# Patient Record
Sex: Female | Born: 1935
Health system: Southern US, Community
[De-identification: ages and names within clinical notes are randomized; demographics above are authoritative.]

## PROBLEM LIST (undated history)

## (undated) DIAGNOSIS — M1990 Unspecified osteoarthritis, unspecified site: Secondary | ICD-10-CM

## (undated) DIAGNOSIS — I10 Essential (primary) hypertension: Secondary | ICD-10-CM

## (undated) DIAGNOSIS — R011 Cardiac murmur, unspecified: Secondary | ICD-10-CM

## (undated) DIAGNOSIS — E049 Nontoxic goiter, unspecified: Secondary | ICD-10-CM

## (undated) DIAGNOSIS — Z952 Presence of prosthetic heart valve: Secondary | ICD-10-CM

## (undated) DIAGNOSIS — K2270 Barrett's esophagus without dysplasia: Secondary | ICD-10-CM

## (undated) DIAGNOSIS — K589 Irritable bowel syndrome without diarrhea: Secondary | ICD-10-CM

## (undated) DIAGNOSIS — Z87828 Personal history of other (healed) physical injury and trauma: Secondary | ICD-10-CM

## (undated) DIAGNOSIS — Z8619 Personal history of other infectious and parasitic diseases: Secondary | ICD-10-CM

## (undated) DIAGNOSIS — Z9889 Other specified postprocedural states: Secondary | ICD-10-CM

## (undated) DIAGNOSIS — K219 Gastro-esophageal reflux disease without esophagitis: Secondary | ICD-10-CM

## (undated) DIAGNOSIS — Z860100 Personal history of colon polyps, unspecified: Secondary | ICD-10-CM

## (undated) DIAGNOSIS — F419 Anxiety disorder, unspecified: Secondary | ICD-10-CM

## (undated) DIAGNOSIS — M3500 Sicca syndrome, unspecified: Secondary | ICD-10-CM

## (undated) DIAGNOSIS — G4730 Sleep apnea, unspecified: Secondary | ICD-10-CM

## (undated) DIAGNOSIS — D869 Sarcoidosis, unspecified: Secondary | ICD-10-CM

## (undated) DIAGNOSIS — C801 Malignant (primary) neoplasm, unspecified: Secondary | ICD-10-CM

## (undated) DIAGNOSIS — E785 Hyperlipidemia, unspecified: Secondary | ICD-10-CM

## (undated) DIAGNOSIS — Z8679 Personal history of other diseases of the circulatory system: Secondary | ICD-10-CM

## (undated) DIAGNOSIS — T7840XA Allergy, unspecified, initial encounter: Secondary | ICD-10-CM

## (undated) HISTORY — DX: Irritable bowel syndrome, unspecified: K58.9

## (undated) HISTORY — DX: Sarcoidosis, unspecified: D86.9

## (undated) HISTORY — DX: Personal history of colonic polyps: Z86.010

## (undated) HISTORY — PX: CATARACT EXTRACTION W/ INTRAOCULAR LENS  IMPLANT, BILATERAL: SHX1307

## (undated) HISTORY — PX: EYE SURGERY: SHX253

## (undated) HISTORY — DX: Essential (primary) hypertension: I10

## (undated) HISTORY — DX: Gastro-esophageal reflux disease without esophagitis: K21.9

## (undated) HISTORY — DX: Sjogren syndrome, unspecified: M35.00

## (undated) HISTORY — PX: NECK SURGERY: SHX720

## (undated) HISTORY — DX: Other specified postprocedural states: Z98.890

## (undated) HISTORY — DX: Nontoxic goiter, unspecified: E04.9

## (undated) HISTORY — DX: Unspecified osteoarthritis, unspecified site: M19.90

## (undated) HISTORY — DX: Sleep apnea, unspecified: G47.30

## (undated) HISTORY — PX: NASAL SINUS SURGERY: SHX719

## (undated) HISTORY — DX: Personal history of other infectious and parasitic diseases: Z86.19

## (undated) HISTORY — DX: Barrett's esophagus without dysplasia: K22.70

## (undated) HISTORY — DX: Personal history of colon polyps, unspecified: Z86.0100

## (undated) HISTORY — PX: UPPER GASTROINTESTINAL ENDOSCOPY: SHX188

## (undated) HISTORY — DX: Anxiety disorder, unspecified: F41.9

## (undated) HISTORY — DX: Allergy, unspecified, initial encounter: T78.40XA

## (undated) HISTORY — DX: Hyperlipidemia, unspecified: E78.5

## (undated) HISTORY — PX: DILATION AND CURETTAGE OF UTERUS: SHX78

---

## 1972-07-26 HISTORY — PX: BREAST LUMPECTOMY: SHX2

## 1974-07-26 HISTORY — PX: TONSILLECTOMY AND ADENOIDECTOMY: SUR1326

## 1998-03-13 ENCOUNTER — Inpatient Hospital Stay (HOSPITAL_COMMUNITY): Admission: RE | Admit: 1998-03-13 | Discharge: 1998-03-13 | Payer: Self-pay | Admitting: Neurosurgery

## 1998-04-07 ENCOUNTER — Ambulatory Visit (HOSPITAL_COMMUNITY): Admission: RE | Admit: 1998-04-07 | Discharge: 1998-04-07 | Payer: Self-pay | Admitting: Neurosurgery

## 1998-10-07 ENCOUNTER — Other Ambulatory Visit: Admission: RE | Admit: 1998-10-07 | Discharge: 1998-10-07 | Payer: Self-pay | Admitting: *Deleted

## 1999-10-07 ENCOUNTER — Other Ambulatory Visit: Admission: RE | Admit: 1999-10-07 | Discharge: 1999-10-07 | Payer: Self-pay | Admitting: *Deleted

## 2000-10-07 ENCOUNTER — Other Ambulatory Visit: Admission: RE | Admit: 2000-10-07 | Discharge: 2000-10-07 | Payer: Self-pay | Admitting: *Deleted

## 2002-04-13 ENCOUNTER — Other Ambulatory Visit: Admission: RE | Admit: 2002-04-13 | Discharge: 2002-04-13 | Payer: Self-pay | Admitting: Family Medicine

## 2002-07-29 ENCOUNTER — Encounter: Payer: Self-pay | Admitting: Emergency Medicine

## 2002-07-29 ENCOUNTER — Emergency Department (HOSPITAL_COMMUNITY): Admission: EM | Admit: 2002-07-29 | Discharge: 2002-07-29 | Payer: Self-pay | Admitting: Emergency Medicine

## 2004-06-23 ENCOUNTER — Other Ambulatory Visit: Admission: RE | Admit: 2004-06-23 | Discharge: 2004-06-23 | Payer: Self-pay | Admitting: Obstetrics and Gynecology

## 2004-08-18 ENCOUNTER — Ambulatory Visit: Payer: Self-pay | Admitting: Internal Medicine

## 2004-08-24 ENCOUNTER — Ambulatory Visit: Admission: RE | Admit: 2004-08-24 | Discharge: 2004-08-24 | Payer: Self-pay | Admitting: Internal Medicine

## 2004-08-24 ENCOUNTER — Ambulatory Visit: Payer: Self-pay | Admitting: Pulmonary Disease

## 2004-08-27 ENCOUNTER — Ambulatory Visit: Payer: Self-pay

## 2004-09-02 ENCOUNTER — Ambulatory Visit: Payer: Self-pay | Admitting: Internal Medicine

## 2004-11-12 ENCOUNTER — Ambulatory Visit: Payer: Self-pay | Admitting: Internal Medicine

## 2004-11-12 ENCOUNTER — Ambulatory Visit (HOSPITAL_COMMUNITY): Admission: RE | Admit: 2004-11-12 | Discharge: 2004-11-12 | Payer: Self-pay | Admitting: Internal Medicine

## 2004-11-30 ENCOUNTER — Ambulatory Visit: Payer: Self-pay | Admitting: Internal Medicine

## 2004-12-28 ENCOUNTER — Ambulatory Visit: Payer: Self-pay | Admitting: Internal Medicine

## 2005-01-04 ENCOUNTER — Encounter (INDEPENDENT_AMBULATORY_CARE_PROVIDER_SITE_OTHER): Payer: Self-pay | Admitting: *Deleted

## 2005-01-04 ENCOUNTER — Ambulatory Visit: Payer: Self-pay | Admitting: Internal Medicine

## 2005-01-04 ENCOUNTER — Ambulatory Visit (HOSPITAL_COMMUNITY): Admission: RE | Admit: 2005-01-04 | Discharge: 2005-01-04 | Payer: Self-pay | Admitting: Internal Medicine

## 2005-07-21 ENCOUNTER — Other Ambulatory Visit: Admission: RE | Admit: 2005-07-21 | Discharge: 2005-07-21 | Payer: Self-pay | Admitting: Obstetrics and Gynecology

## 2005-12-14 ENCOUNTER — Ambulatory Visit: Payer: Self-pay | Admitting: Internal Medicine

## 2006-01-21 ENCOUNTER — Ambulatory Visit: Payer: Self-pay | Admitting: Internal Medicine

## 2006-01-25 ENCOUNTER — Ambulatory Visit: Payer: Self-pay | Admitting: Internal Medicine

## 2006-01-28 ENCOUNTER — Ambulatory Visit: Payer: Self-pay | Admitting: Internal Medicine

## 2006-02-07 ENCOUNTER — Ambulatory Visit (HOSPITAL_COMMUNITY): Admission: RE | Admit: 2006-02-07 | Discharge: 2006-02-07 | Payer: Self-pay | Admitting: Obstetrics and Gynecology

## 2006-02-22 ENCOUNTER — Ambulatory Visit: Payer: Self-pay | Admitting: Internal Medicine

## 2006-02-22 ENCOUNTER — Ambulatory Visit (HOSPITAL_COMMUNITY): Admission: RE | Admit: 2006-02-22 | Discharge: 2006-02-22 | Payer: Self-pay | Admitting: Cardiology

## 2006-04-01 ENCOUNTER — Ambulatory Visit: Payer: Self-pay | Admitting: Internal Medicine

## 2006-11-07 ENCOUNTER — Emergency Department (HOSPITAL_COMMUNITY): Admission: EM | Admit: 2006-11-07 | Discharge: 2006-11-07 | Payer: Self-pay | Admitting: Emergency Medicine

## 2007-08-14 ENCOUNTER — Ambulatory Visit: Payer: Self-pay | Admitting: Internal Medicine

## 2007-08-21 ENCOUNTER — Encounter: Payer: Self-pay | Admitting: Internal Medicine

## 2007-08-21 ENCOUNTER — Ambulatory Visit: Payer: Self-pay | Admitting: Internal Medicine

## 2007-10-09 ENCOUNTER — Ambulatory Visit: Payer: Self-pay | Admitting: Internal Medicine

## 2008-01-03 ENCOUNTER — Telehealth: Payer: Self-pay | Admitting: Internal Medicine

## 2008-03-20 ENCOUNTER — Encounter: Admission: RE | Admit: 2008-03-20 | Discharge: 2008-03-20 | Payer: Self-pay | Admitting: Internal Medicine

## 2008-03-20 ENCOUNTER — Encounter (INDEPENDENT_AMBULATORY_CARE_PROVIDER_SITE_OTHER): Payer: Self-pay | Admitting: Interventional Radiology

## 2008-03-20 ENCOUNTER — Other Ambulatory Visit: Admission: RE | Admit: 2008-03-20 | Discharge: 2008-03-20 | Payer: Self-pay | Admitting: Interventional Radiology

## 2008-06-05 HISTORY — PX: TOTAL THYROIDECTOMY: SHX2547

## 2008-08-01 ENCOUNTER — Telehealth: Payer: Self-pay | Admitting: Internal Medicine

## 2008-08-02 DIAGNOSIS — R1319 Other dysphagia: Secondary | ICD-10-CM | POA: Insufficient documentation

## 2008-08-08 ENCOUNTER — Ambulatory Visit (HOSPITAL_COMMUNITY): Admission: RE | Admit: 2008-08-08 | Discharge: 2008-08-08 | Payer: Self-pay | Admitting: Internal Medicine

## 2008-08-09 DIAGNOSIS — K219 Gastro-esophageal reflux disease without esophagitis: Secondary | ICD-10-CM | POA: Insufficient documentation

## 2008-08-09 DIAGNOSIS — F411 Generalized anxiety disorder: Secondary | ICD-10-CM | POA: Insufficient documentation

## 2008-08-09 DIAGNOSIS — I10 Essential (primary) hypertension: Secondary | ICD-10-CM | POA: Insufficient documentation

## 2008-08-09 DIAGNOSIS — D869 Sarcoidosis, unspecified: Secondary | ICD-10-CM | POA: Insufficient documentation

## 2008-08-09 DIAGNOSIS — Z8719 Personal history of other diseases of the digestive system: Secondary | ICD-10-CM | POA: Insufficient documentation

## 2008-08-09 DIAGNOSIS — M1990 Unspecified osteoarthritis, unspecified site: Secondary | ICD-10-CM | POA: Insufficient documentation

## 2008-08-09 DIAGNOSIS — K589 Irritable bowel syndrome without diarrhea: Secondary | ICD-10-CM | POA: Insufficient documentation

## 2008-08-09 DIAGNOSIS — Z860100 Personal history of colon polyps, unspecified: Secondary | ICD-10-CM | POA: Insufficient documentation

## 2008-08-09 DIAGNOSIS — K2270 Barrett's esophagus without dysplasia: Secondary | ICD-10-CM | POA: Insufficient documentation

## 2008-08-09 DIAGNOSIS — J45909 Unspecified asthma, uncomplicated: Secondary | ICD-10-CM | POA: Insufficient documentation

## 2008-08-09 DIAGNOSIS — E785 Hyperlipidemia, unspecified: Secondary | ICD-10-CM | POA: Insufficient documentation

## 2008-08-09 DIAGNOSIS — K649 Unspecified hemorrhoids: Secondary | ICD-10-CM | POA: Insufficient documentation

## 2008-08-09 DIAGNOSIS — F419 Anxiety disorder, unspecified: Secondary | ICD-10-CM | POA: Insufficient documentation

## 2008-08-14 ENCOUNTER — Ambulatory Visit: Payer: Self-pay | Admitting: Internal Medicine

## 2008-08-14 DIAGNOSIS — K920 Hematemesis: Secondary | ICD-10-CM | POA: Insufficient documentation

## 2008-08-14 LAB — CONVERTED CEMR LAB
Basophils Relative: 0.3 % (ref 0.0–3.0)
Calcium: 9.4 mg/dL (ref 8.4–10.5)
Creatinine, Ser: 1.3 mg/dL — ABNORMAL HIGH (ref 0.4–1.2)
Eosinophils Absolute: 0 10*3/uL (ref 0.0–0.7)
GFR calc non Af Amer: 42 mL/min
Glucose, Bld: 152 mg/dL — ABNORMAL HIGH (ref 70–99)
HCT: 41.7 % (ref 36.0–46.0)
Hemoglobin: 14.9 g/dL (ref 12.0–15.0)
Lymphocytes Relative: 4.2 % — ABNORMAL LOW (ref 12.0–46.0)
Monocytes Absolute: 0.4 10*3/uL (ref 0.1–1.0)
Monocytes Relative: 3.2 % (ref 3.0–12.0)
Potassium: 4.4 meq/L (ref 3.5–5.1)
RDW: 11.8 % (ref 11.5–14.6)

## 2008-08-15 ENCOUNTER — Ambulatory Visit: Payer: Self-pay | Admitting: Internal Medicine

## 2008-08-15 ENCOUNTER — Encounter: Payer: Self-pay | Admitting: Internal Medicine

## 2008-08-16 ENCOUNTER — Encounter: Payer: Self-pay | Admitting: Internal Medicine

## 2009-03-26 ENCOUNTER — Encounter: Payer: Self-pay | Admitting: Internal Medicine

## 2009-04-01 ENCOUNTER — Encounter: Payer: Self-pay | Admitting: Internal Medicine

## 2009-06-27 ENCOUNTER — Telehealth (INDEPENDENT_AMBULATORY_CARE_PROVIDER_SITE_OTHER): Payer: Self-pay | Admitting: *Deleted

## 2010-03-03 ENCOUNTER — Emergency Department (HOSPITAL_COMMUNITY): Admission: EM | Admit: 2010-03-03 | Discharge: 2010-03-03 | Payer: Self-pay | Admitting: Emergency Medicine

## 2010-04-20 ENCOUNTER — Ambulatory Visit: Payer: Self-pay | Admitting: Cardiology

## 2010-04-24 ENCOUNTER — Ambulatory Visit: Payer: Self-pay | Admitting: Cardiology

## 2010-04-24 ENCOUNTER — Encounter: Admission: RE | Admit: 2010-04-24 | Discharge: 2010-04-24 | Payer: Self-pay | Admitting: Cardiology

## 2010-07-08 ENCOUNTER — Telehealth: Payer: Self-pay | Admitting: Physician Assistant

## 2010-07-09 ENCOUNTER — Encounter: Payer: Self-pay | Admitting: Physician Assistant

## 2010-07-09 ENCOUNTER — Ambulatory Visit: Payer: Self-pay | Admitting: Internal Medicine

## 2010-07-09 DIAGNOSIS — E039 Hypothyroidism, unspecified: Secondary | ICD-10-CM | POA: Insufficient documentation

## 2010-07-09 DIAGNOSIS — R141 Gas pain: Secondary | ICD-10-CM | POA: Insufficient documentation

## 2010-07-09 DIAGNOSIS — R143 Flatulence: Secondary | ICD-10-CM

## 2010-07-09 DIAGNOSIS — R142 Eructation: Secondary | ICD-10-CM

## 2010-07-09 DIAGNOSIS — R1032 Left lower quadrant pain: Secondary | ICD-10-CM | POA: Insufficient documentation

## 2010-07-09 DIAGNOSIS — K5900 Constipation, unspecified: Secondary | ICD-10-CM | POA: Insufficient documentation

## 2010-07-09 DIAGNOSIS — R1012 Left upper quadrant pain: Secondary | ICD-10-CM | POA: Insufficient documentation

## 2010-07-21 ENCOUNTER — Telehealth: Payer: Self-pay | Admitting: Internal Medicine

## 2010-07-24 ENCOUNTER — Ambulatory Visit (HOSPITAL_COMMUNITY)
Admission: RE | Admit: 2010-07-24 | Discharge: 2010-07-24 | Payer: Self-pay | Source: Home / Self Care | Attending: Internal Medicine | Admitting: Internal Medicine

## 2010-07-24 ENCOUNTER — Encounter: Payer: Self-pay | Admitting: Internal Medicine

## 2010-07-28 ENCOUNTER — Encounter: Payer: Self-pay | Admitting: Internal Medicine

## 2010-07-29 ENCOUNTER — Encounter (INDEPENDENT_AMBULATORY_CARE_PROVIDER_SITE_OTHER): Payer: Self-pay | Admitting: *Deleted

## 2010-08-16 ENCOUNTER — Encounter: Payer: Self-pay | Admitting: Obstetrics and Gynecology

## 2010-08-16 ENCOUNTER — Encounter: Payer: Self-pay | Admitting: Internal Medicine

## 2010-08-27 NOTE — Letter (Signed)
Summary: [facility] Instructions  [facility]  [address] [place], [place] [address]   Phone: [phone]  Fax: [phone]       MATTISEN POHLMANN    November 29, 75 1932    MRN: [id number]       Procedure Day /Date:12-30 2011     Arrival Time: 8:30 Am      Procedure Time: 9:30 AM     Location of Procedure:                     X     [facility] ( Outpatient Registration)   PREPARATION FOR COLONOSCOPY WITH MIRALAX  Starting 5 days prior to your procedure 07-19-2010 do not eat nuts, seeds, popcorn, corn, beans, peas,  salads, or any raw vegetables.  Do not take any fiber supplements (e.g. Metamucil, Citrucel, and Benefiber). ____________________________________________________________________________________________________   THE DAY BEFORE YOUR PROCEDURE         DATE:07-23-2010 DAY: Thursday  1   Drink clear liquids the entire day-NO SOLID FOOD  2   Do not drink anything colored red or purple.  Avoid juices with pulp.  No orange juice.  3   Drink at least 64 oz. (8 glasses) of fluid/clear liquids during the day to prevent dehydration and help the prep work efficiently.  CLEAR LIQUIDS INCLUDE: Water Jello Ice Popsicles Tea (sugar ok, no milk/cream) Powdered fruit flavored drinks Coffee (sugar ok, no milk/cream) Gatorade Juice: apple, white grape, white cranberry  Lemonade Clear bullion, consomm, broth Carbonated beverages (any kind) Strained chicken noodle soup Hard Candy  4   Mix the entire bottle of Miralax with 64 oz. of Gatorade/Powerade in the morning and put in the refrigerator to chill.  5   At 3:00 pm take 2 Dulcolax/Bisacodyl tablets.  6   At 4:30 pm take one Reglan/Metoclopramide tablet.  7  Starting at 5:00 pm drink one 8 oz glass of the Miralax mixture every 15-20 minutes until you have finished drinking the entire 64 oz.  You should finish drinking prep around 7:30 or 8:00 pm.  8   If you are nauseated, you may take the 2nd  Reglan/Metoclopramide tablet at 6:30 pm.        9    At 8:00 pm take 2 more DULCOLAX/Bisacodyl tablets.     THE DAY OF YOUR PROCEDURE      DATE: 07-24-2010 DAY: Friday  You may drink clear liquids until 7:30 AM  (2 HOURS BEFORE PROCEDURE).    MEDICATION INSTRUCTIONS  Unless otherwise instructed, you should take regular prescription medications with a small sip of water as early as possible the morning of your procedure.        OTHER INSTRUCTIONS  You will need a responsible adult at least 75 years of age to accompany you and drive you home.   This person must remain in the waiting room during your procedure.  Wear loose fitting clothing that is easily removed.  Leave jewelry and other valuables at home.  However, you may wish to bring a book to read or an iPod/MP3 player to listen to music as you wait for your procedure to start.  Remove all body piercing jewelry and leave at home.  Total time from sign-in until discharge is approximately 2-3 hours.  You should go home directly after your procedure and rest.  You can resume normal activities the day after your procedure.  The day of your procedure you should not:   Drive  Make legal decisions   Operate machinery   Drink alcohol   Return to work  You will receive specific instructions about eating, activities and medications before you leave.   The above instructions have been reviewed and explained to me by   _______________________    I fully understand and can verbalize these instructions _____________________________ Date _______

## 2010-08-27 NOTE — Letter (Signed)
Summary: Patient Notice-Endo Biopsy Results  [facility]  [address] [place], [place] [address]   Phone: [phone]  Fax: [phone]        July 28, 2010 MRN: [id number]    Brenda [name] [address] [place], [place]  [address]    Dear [name],  I am pleased to inform you that the biopsies taken during your recent endoscopic examination did not show any evidence of cancer upon pathologic examination.There is no evidence of Barrett's esophagus  Additional information/recommendations:  __No further action is needed at this time.  Please follow-up with      your primary care physician for your other healthcare needs.  __ Please call [phone] to schedule a return visit to review      your condition.  _x_ Continue with the treatment plan as outlined on the day of your      exam.  _x_ You should have a repeat endoscopic examination for this problem              in 5 years_  Please call us if you are having persistent problems or have questions about your condition that have not been fully answered at this time.  Sincerely,  Hart Carwin MD  This letter has been electronically signed by your physician.  Appended Document: Patient Notice-Endo Biopsy Results Letter mailed to patient's home.  Appended Document: Patient Notice-Endo Biopsy Results 5 year recall in IDX.

## 2010-08-27 NOTE — Miscellaneous (Signed)
  Clinical Lists Changes  Observations: Added new observation of EGD: 07/17/10 (07/29/2010 8:16) Added new observation of COLONNXTDUE: 06/2015 (07/29/2010 8:16) Added new observation of COLONOSCOPY: 07/17/10 (07/29/2010 8:16)

## 2010-08-27 NOTE — Progress Notes (Signed)
Summary: Triage  Phone Note Call from Patient   Caller: [facility] Call For: Dr. Juanda Chance Reason for Call: Talk to Nurse Summary of Call: Beth from Dr. Darrol Poke office is calling to schedule this appt as soon as possible for severe abdominal pain, pt is hard of hearing so call beth back to schedule pt, her direct line is [phone], pt was seen today and Waynetta Sandy is faxing office notes for [place] to review Initial call taken by: [place] Johnson,  July 08, 2010 3:45 PM  Follow-up for Phone Call        Spoke with [facility] and scheduled patient on 07/09/10 at 2:00PM with Mike Gip, PA. She will fax [place] the records from their office. Follow-up by: Jesse Fall RN,  July 08, 2010 4:26 PM  Additional Follow-up for Phone Call Additional follow up Details #1::        Reviewed and agree. IBS, Barret's 07/2008, recall 07/2010. Additional Follow-up by: Hart Carwin MD,  July 08, 2010 11:23 PM

## 2010-08-27 NOTE — Procedures (Signed)
Summary: Upper Endoscopy  Patient: [name] Note: All result statuses are Final unless otherwise noted.  Tests: (1) Upper Endoscopy (EGD)   EGD Upper Endoscopy       DONE     [facility]     [address] [place], [place]  [address]           ENDOSCOPY PROCEDURE REPORT           PATIENT:  Brenda [name], Brenda [name]  MR#:  [id number]     BIRTHDATE:  Oct 26, 1930, 79 yrs. old  GENDER:  female           ENDOSCOPIST:  Hedwig Morton. Juanda Chance, MD     Referred by:  Catha Gosselin, M.D.           PROCEDURE DATE:  07/24/2010     PROCEDURE:  EGD with biopsy, [address]     ASA CLASS:  Class II     INDICATIONS:  h/o Barrett's Esophagus last EGD 07/2008 intestinal     metaplasia           MEDICATIONS:   Versed 7 mg, Fentanyl 75 mcg, Benadryl 25 mg     TOPICAL ANESTHETIC:  Cetacaine Spray           DESCRIPTION OF PROCEDURE:   After the risks benefits and     alternatives of the procedure were thoroughly explained, informed     consent was obtained.  The Pentax Gastroscope [id number] endoscope     was introduced through the mouth and advanced to the second     portion of the duodenum, without limitations.  The instrument was     slowly withdrawn as the mucosa was fully examined.     <<PROCEDUREIMAGES>>           irregular Z-line. With standard forceps, a biopsy was obtained and     sent to pathology (see image1). r/o Barrett's  Otherwise the     examination was normal (see image4, image3, and image2).     Retroflexed views revealed no abnormalities.    The scope was then     withdrawn from the patient and the procedure completed.           COMPLICATIONS:  None           ENDOSCOPIC IMPRESSION:     1) Irregular Z-line     2) Otherwise normal examination     s/p Bx's to r/o Barrett's     RECOMMENDATIONS:     1) Await biopsy results     2) Anti-reflux regimen to be follow     continue PPI           REPEAT EXAM:  In 2 year(s) for.           ______________________________     Hedwig Morton. Juanda Chance, MD           CC:           n.     eSIGNED:   Hedwig Morton. Brodie at 07/24/2010 10:55 AM           Tawana Scale, [id number]  Note: An exclamation mark (!) indicates a result that was not dispersed into the flowsheet. Document Creation Date: 07/24/2010 10:56 AM _______________________________________________________________________  (1) Order result status: Final Collection or observation date-time: 07/24/2010 10:35 Requested date-time:  Receipt date-time:  Reported date-time:  Referring Physician:   Ordering Physician: Lina Sar [phone]) Specimen Source:  Source: Launa Grill Order Number: [id number] Lab  site:

## 2010-08-27 NOTE — Letter (Signed)
Summary: Patient Notice- Polyp Results  [facility]  [address] [place], [place] [address]   Phone: [phone]  Fax: [phone]        July 28, 2010 MRN: [id number]    Brenda [name] [address] [place], [place]  [address]    Dear [name],  I am pleased to inform you that the colon polyp(s) removed during your recent colonoscopy was (were) found to be benign (no cancer detected) upon pathologic examination.The polyp was adenomatous ( precancerous)  I recommend you have a repeat colonoscopy examination in 5_ years to look for recurrent polyps, as having colon polyps increases your risk for having recurrent polyps or even colon cancer in the future.  Should you develop new or worsening symptoms of abdominal pain, bowel habit changes or bleeding from the rectum or bowels, please schedule an evaluation with either your primary care physician or with me.  Additional information/recommendations:  _x_ No further action with gastroenterology is needed at this time. Please      follow-up with your primary care physician for your other healthcare      needs.  __ Please call [phone] to schedule a return visit to review your      situation.  __ Please keep your follow-up visit as already scheduled.  __ Continue treatment plan as outlined the day of your exam.  Please call us if you are having persistent problems or have questions about your condition that have not been fully answered at this time.  Sincerely,  Hart Carwin MD  This letter has been electronically signed by your physician.  Appended Document: Patient Notice- Polyp Results Letter mailed to patient. 5 year recall in IDX.

## 2010-08-27 NOTE — Procedures (Signed)
Summary: Colonoscopy  Patient: Brenda [name] Note: All result statuses are Final unless otherwise noted.  Tests: (1) Colonoscopy (COL)   COL Colonoscopy           DONE     [facility]     [address] [place], [place]  [address]           COLONOSCOPY PROCEDURE REPORT           PATIENT:  Brenda [name], Brenda [name]  MR#:  [id number]     BIRTHDATE:  1931-05-26, 79 yrs. old  GENDER:  female     ENDOSCOPIST:  Hedwig Morton. Juanda Chance, MD     REF. BY:  Catha Gosselin, M.D.     PROCEDURE DATE:  07/24/2010     PROCEDURE:  Colonoscopy 19147     ASA CLASS:  Class II     INDICATIONS:  Abdominal pain, family history of colon cancer hx     adenomatous polyp in 1992, also 1995, 2001, last colon 2009     tubular adenoma     MEDICATIONS:   Versed 3 mg, Fentanyl 25 mcg, Benadryl 25 mg           DESCRIPTION OF PROCEDURE:   After the risks benefits and     alternatives of the procedure were thoroughly explained, informed     consent was obtained.  Digital rectal exam was performed and     revealed no rectal masses.   The  endoscope was introduced through     the anus and advanced to the cecum, which was identified by both     the appendix and ileocecal valve, without limitations.  The     quality of the prep was excellent, using MiraLax.  The instrument     was then slowly withdrawn as the colon was fully examined.     <<PROCEDUREIMAGES>>           FINDINGS:  Melanosis coli was found (see image1).  A sessile polyp     was found. 4 mm flat polyp at 70 cm Polyps were snared, then     cauterized with monopolar cautery. Retrieval was successful (see     image3). snare polyp  This was otherwise a normal examination of     the colon (see image6, image5, image1, and image2).   Retroflexed     views in the rectum revealed no abnormalities.    The scope was     then withdrawn from the patient and the procedure completed.           COMPLICATIONS:  None     ENDOSCOPIC IMPRESSION:     1) Melanosis     2) Sessile  polyp     3) Otherwise normal examination     no evidence of diverticulosis     RECOMMENDATIONS:     1) Await pathology results     REPEAT EXAM:  In 5 year(s) for.           ______________________________     Hedwig Morton. Juanda Chance, MD           CC:           n.     eSIGNED:   Hedwig Morton. Brodie at 07/24/2010 11:04 AM           Tawana Scale, [id number]  Note: An exclamation mark (!) indicates a result that was not dispersed into the flowsheet. Document Creation Date: 07/24/2010 11:04 AM _______________________________________________________________________  (1)  Order result status: Final Collection or observation date-time: 07/24/2010 10:56 Requested date-time:  Receipt date-time:  Reported date-time:  Referring Physician:   Ordering Physician: Lina Sar [phone]) Specimen Source:  Source: Launa Grill Order Number: [id number] Lab site:   Appended Document: Colonoscopy Recall 5 year in IDX

## 2010-08-27 NOTE — Assessment & Plan Note (Signed)
Summary: severe abd pain/Regina   History of Present Illness Visit Type: Follow-up Visit Primary GI MD: Lina Sar MD Primary Alexzandrea Normington: Beulah Gandy Requesting Mykayla Brinton: n/a Chief Complaint: Severe abdominal pain, on her left side History of Present Illness:   77 YO FEMALE KNOWN TO DR. Juanda Chance. SHE HAS HX OF BARRETTS ESOPHAGUS,GASTRIC ULCER,2006, AND MULTIPLE ANTRAL ULCERS ON EGD IN 1/10. ALSO WITH ADENOMATOUS POLYPS ON COLONOSCOPY 2009.  SHE COMES IN TODAY WITH C/O LEFT SIDED ABDOMINAL PAIN. PAIN RADIATES AROUND IN TO HER BACK OVER THE PAST FEW DAYS . SHE FEELS" SORE". SHE SAYS OVER THE PAST 6 MONTHS SHE HAS BEEN STRUGGLING WITH ALTERNATING BOWEL HABITS. ALSO C/O UPPER ABDOMINAL BLOATING AND SWELLING. INTERMITTENT INDIGESTION. APPETITE IS FINE,WEIGHT  IS UP SINCE SHE HAD THYROID SURGERY. ON NEXIUM OVER THE PAST COUPLE MONTHS WITH NO CHANGE. SHE SAYS SHE REALLY HAS NOT FELT WELL SINCE THE THYROID SURGERY- HAS GAINED WEIGHT, HAS HAS HAIR LOSS, LACK OF ENERGY AND IS COLD ALL THE TIME. SHE ALSO HAS HAS OF PULMONARY SARCOIDOSIS, AND WONDERS IF IT IS ACTIVE.    GI Review of Systems    Reports abdominal pain, acid reflux, bloating, and  heartburn.     Location of  Abdominal pain: LLQ.    Denies belching, chest pain, dysphagia with liquids, dysphagia with solids, loss of appetite, nausea, vomiting, vomiting blood, weight loss, and  weight gain.      Reports constipation and  diarrhea.     Denies anal fissure, black tarry stools, change in bowel habit, diverticulosis, fecal incontinence, heme positive stool, hemorrhoids, irritable bowel syndrome, jaundice, light color stool, liver problems, rectal bleeding, and  rectal pain.    Current Medications (verified): 1)  Xanax 1 Mg Tabs (Alprazolam) .... 2 Every Am and 3 At Bedtime 2)  Triamterene-Hctz 75-50 Mg Tabs (Triamterene-Hctz) 3)  Pravastatin Sodium 80 Mg Tabs (Pravastatin Sodium) .Marland Kitchen.. 1 By Mouth Once Daily 4)  Multivitamins   Tabs (Multiple  Vitamin) .... Once Daily 5)  Oscal 500/200 D-3 500-200 Mg-Unit Tabs (Calcium-Vitamin D) .... Once Daily 6)  Senna 187 Mg Tabs (Senna) .... Take 3 Tablets At Bedtime 7)  Systane 0.4-0.3 % Soln (Polyethyl Glycol-Propyl Glycol) .Marland Kitchen.. 1 Drop Into Affected Eye As Needed 2-4 Tmes Daily 8)  Restasis 0.05 % Emul (Cyclosporine) .... As Directed 9)  Alrex 0.2 % Susp (Loteprednol Etabonate) .Marland Kitchen.. 1 Drop Into Affected Eye 3 Times A Week 10)  Zaditor 0.025 % Soln (Ketotifen Fumarate) .Marland Kitchen.. 1 Drop Into Affected Eye Two Times A Day 11)  Selenium 200 Mcg Tabs (Selenium) .... Take 1 Tablet By Mouth Once Daily 12)  Fish Oil 1000 Mg Caps (Omega-3 Fatty Acids) .... Take 3 Capsules By Mouth Three Times A Day 13)  Nexium 40 Mg Cpdr (Esomeprazole Magnesium) .... Take 1 Tablet Every Other Day 14)  Amlodipine Besylate 5 Mg Tabs (Amlodipine Besylate) .... Take 1 Tablet By Mouth Once Daily 15)  Synthroid 75 Mcg Tabs (Levothyroxine Sodium) .... Take 1 Tablet By Mouth Once Daily 16)  Flonase 50 Mcg/act Susp (Fluticasone Propionate) .... 2 Puffs Once Daily 17)  Benadryl 25 Mg Tabs (Diphenhydramine Hcl) .... Take 1 Tablet By Mouth Every 6 Hours 18)  Vitamin D 1000 Unit Tabs (Cholecalciferol) .Marland Kitchen.. 1 By Mouth Once Daily  Allergies (verified): No Known Drug Allergies  Past History:  Past Medical History: Current Problems:  ANXIETY (ICD-300.00) HYPERLIPIDEMIA (ICD-272.4) HYPERTENSION (ICD-401.9) IRRITABLE BOWEL SYNDROME (ICD-564.1) HEMORRHOIDS (ICD-455.6) DEGENERATIVE JOINT DISEASE (ICD-715.90) COLONIC POLYPS, ADENOMATOUS, HX OF (ICD-V12.72) HX  GASTRIC ULCER SARCOIDOSIS (ICD-135) ASTHMA, UNSPECIFIED (ICD-493.90) BARRETTS ESOPHAGUS (ICD-530.85) HELICOBACTER PYLORI GASTRITIS, HX OF (ICD-V12.79) Family Hx of COLON CANCER (ICD-153.9) GERD (ICD-530.81) HX SHINGLES/FACE/ EYE  Past Surgical History: D & C Cervical Disk with plate and bone fusions x 2 sinus surgery breast lumpectomy Thyroid Surgery-2009   Family  History: Reviewed history from 08/09/2008 and no changes required. Family History of Colon Cancer: Sister Family History of Heart Disease: Father  Social History: Patient has never smoked.  Alcohol Use - yes-occasional wine Illicit Drug Use - no Occupation: retired at 22  Review of Systems  The patient denies allergy/sinus, anemia, anxiety-new, arthritis/joint pain, back pain, blood in urine, breast changes/lumps, change in vision, confusion, cough, coughing up blood, depression-new, fainting, fatigue, fever, headaches-new, hearing problems, heart murmur, heart rhythm changes, itching, menstrual pain, muscle pains/cramps, night sweats, nosebleeds, pregnancy symptoms, shortness of breath, skin rash, sleeping problems, sore throat, swelling of feet/legs, swollen lymph glands, thirst - excessive , urination - excessive , urination changes/pain, urine leakage, vision changes, and voice change.         SEE HPI  Vital Signs:  Patient profile:   77 year old female Height:      63 inches Weight:      149 pounds BMI:     26.49 BSA:     1.71 Pulse rate:   88 / minute Pulse rhythm:   regular BP sitting:   122 / 80  (left arm)  Vitals Entered By: Merri Ray CMA Duncan Dull) (July 09, 2010 1:50 PM)  Physical Exam  General:  Well developed, well nourished, no acute distress. Head:  Normocephalic and atraumatic. Eyes:  PERRLA, no icterus.,PT LOST HER EYEBROWS WITH SHINGLES Lungs:  Clear throughout to auscultation. Heart:  Regular rate and rhythm; no murmurs, rubs,  or bruits. Abdomen:  SOFT, BS+, MILD TENDERNESS LEFT MID QUAD. SHE ASLO HAS A FAINT PATCHY ERYTHEMA ON UPPER LEFT ABDOMEN ?EARLY ZOSTER. NO MASS OR HSM, Rectal:  NOT DONE Extremities:  No clubbing, cyanosis, edema or deformities noted. Neurologic:  Alert and  oriented x4;  grossly normal neurologically. Psych:  Alert and cooperative. Normal mood and affect.   Impression & Recommendations:  Problem # 1:  ABDOMINAL PAIN-LUQ  (ICD-789.02) Assessment Deteriorated 77 YO FEMALE WITH 6 MONTH HX OF LEFT SIDED ABDOMINAL PAIN, ALTERED BOWEL HABITS,BLOATING. SXS MAY BE SECONDARY TO IBS-R/O OCCULT COLON LESION.  PT HAS A NEW LUQ PAIN/SORENESS X 3-4 DAYS  WITH SKIN SENSITIVITY ?EARLY ZOSTER  SCHEDULE FOR COLONOSCOPY WITH DR. Hermelinda Medicus DISCUSSED IN DETAIL WITH PT.  HAVE RX'D ZOVIRAX   X 7 DAYS , SHE MAY WAIT AND SEE IF  RASH INCREASES BEFORE STARTING  Problem # 2:  FAMILY HX COLON CANCER (ICD-V16.0) Assessment: Unchanged  Problem # 3:  FLATULENCE-GAS-BLOATING (ICD-787.3) Assessment: Deteriorated INDIGESTION,UPPER ABDOMINAL PAIN. HX OF PUD. R/O RECURRENCE.  INCREASE NEXIUM TO 40 MG DAILY SCHEDULE FOR EGD WITH DR. Juanda Chance AS WELL.  Problem # 4:  IRRITABLE BOWEL SYNDROME (ICD-564.1) Assessment: Comment Only  Orders: [facility] Hospital (Col/End [place])  Problem # 5:  BARRETTS ESOPHAGUS (ICD-530.85) Assessment: Unchanged  Orders: Colon/Endo Hospital (Col/End [place])  Problem # 6:  HYPERTENSION (ICD-401.9) Assessment: Comment Only  Problem # 7:  HYPOTHYROIDISM (ICD-244.9) Assessment: Comment Only ON REPLACEMENT  Other Orders: ACE/ARB INHIBITOR THERAPY PRESCRIBED (CPT-4009F) T- * Misc. Laboratory test [phone])  Patient Instructions: 1)  Please go to lab, basement level. 2)  We scheduled the Endoscopy/Colon with Dr. Juanda Chance at [facility] on 07-24-2010. 3)  Directions  and brochure provided. 4)  We sent prescription for the colonoscopy prep to [facility])  and the Zovirax.    Prescriptions: ZOVIRAX 800 MG TABS (ACYCLOVIR) Take 1 tab 5 times daily x 7 days  #35 x 0   Entered by:   Lowry Ram NCMA   Authorized by:   Sammuel Cooper PA-c   Signed by:   Lowry Ram NCMA on 07/09/2010   Method used:   Electronically to        [facility] (retail)       [address]       [place], [place]  [address]       Ph: [phone] or [phone]       Fax: [phone]   RxID:    [id number] REGLAN 10 MG  TABS (METOCLOPRAMIDE HCL) As per prep instructions.  #2 x 0   Entered by:   Lowry Ram NCMA   Authorized by:   Sammuel Cooper PA-c   Signed by:   Lowry Ram NCMA on 07/09/2010   Method used:   Electronically to        [facility] (retail)       [address]       [place], [place]  [address]       Ph: [phone] or [phone]       Fax: [phone]   RxID:   [id number] DULCOLAX 5 MG  TBEC (BISACODYL) Day before procedure take 2 at 3pm and 2 at 8pm.  #4 x 0   Entered by:   Lowry Ram NCMA   Authorized by:   Sammuel Cooper PA-c   Signed by:   Lowry Ram NCMA on 07/09/2010   Method used:   Electronically to        [facility] (retail)       [address]       [place], [place]  [address]       Ph: [phone] or [phone]       Fax: [phone]   RxID:   [id number] MIRALAX   POWD (POLYETHYLENE GLYCOL 3350) As per prep  instructions.  #255gm x 0   Entered by:   Lowry Ram NCMA   Authorized by:   Sammuel Cooper PA-c   Signed by:   Lowry Ram NCMA on 07/09/2010   Method used:   Electronically to        [facility] (retail)       [address]       [place], [place]  [address]       Ph: [phone] or [phone]       Fax: [phone]   RxID:   [id number]

## 2010-08-27 NOTE — Procedures (Signed)
Summary: Colon Prep  Colon Prep   Imported By: [facility] 07/14/2010 10:43:31  _____________________________________________________________________  External Attachment:    Type:   Image     Comment:   External Document

## 2010-08-27 NOTE — Progress Notes (Signed)
Summary: Scratchy throat  Phone Note Call from Patient Call back at Home Phone [phone]   Call For: Dr Juanda Chance Reason for Call: Talk to Nurse Summary of Call: Has a scratchy throat and took a zpak which did not help her.  Is it ok to go thru procedure at hospital on friday? Initial call taken by: Leanor Kail [facility],  July 21, 2010 12:11 PM  Follow-up for Phone Call        Spoke with patient and let her know that as long as she is not running a fever she is fine to have the procedure on Friday. Follow-up by: Selinda Michaels RN,  July 21, 2010 12:36 PM

## 2010-08-27 NOTE — Miscellaneous (Signed)
  Clinical Lists Changes 

## 2010-09-01 ENCOUNTER — Emergency Department (HOSPITAL_COMMUNITY): Payer: Medicare Other

## 2010-09-01 ENCOUNTER — Emergency Department (HOSPITAL_COMMUNITY)
Admission: EM | Admit: 2010-09-01 | Discharge: 2010-09-01 | Disposition: A | Discharge status: Home / Self Care | Payer: Medicare Other | Attending: Emergency Medicine | Admitting: Emergency Medicine

## 2010-09-01 ENCOUNTER — Encounter (HOSPITAL_COMMUNITY): Payer: Self-pay | Admitting: Radiology

## 2010-09-01 DIAGNOSIS — IMO0002 Reserved for concepts with insufficient information to code with codable children: Secondary | ICD-10-CM | POA: Insufficient documentation

## 2010-09-01 DIAGNOSIS — S0990XA Unspecified injury of head, initial encounter: Secondary | ICD-10-CM | POA: Insufficient documentation

## 2010-09-01 DIAGNOSIS — W19XXXA Unspecified fall, initial encounter: Secondary | ICD-10-CM | POA: Insufficient documentation

## 2010-09-01 DIAGNOSIS — M25559 Pain in unspecified hip: Secondary | ICD-10-CM | POA: Insufficient documentation

## 2010-09-01 DIAGNOSIS — R51 Headache: Secondary | ICD-10-CM | POA: Insufficient documentation

## 2010-09-01 DIAGNOSIS — I10 Essential (primary) hypertension: Secondary | ICD-10-CM | POA: Insufficient documentation

## 2010-09-01 DIAGNOSIS — M542 Cervicalgia: Secondary | ICD-10-CM | POA: Insufficient documentation

## 2010-11-02 ENCOUNTER — Other Ambulatory Visit: Payer: Self-pay | Admitting: Rheumatology

## 2010-11-02 DIAGNOSIS — D869 Sarcoidosis, unspecified: Secondary | ICD-10-CM

## 2010-11-05 ENCOUNTER — Ambulatory Visit
Admission: RE | Admit: 2010-11-05 | Discharge: 2010-11-05 | Disposition: A | Discharge status: Home / Self Care | Payer: Medicare Other | Source: Ambulatory Visit | Attending: Rheumatology | Admitting: Rheumatology

## 2010-11-05 DIAGNOSIS — D869 Sarcoidosis, unspecified: Secondary | ICD-10-CM

## 2010-11-05 MED ORDER — IOHEXOL 300 MG/ML  SOLN: 75.0000 mL | Freq: Once | INTRAMUSCULAR | Status: AC | PRN

## 2010-11-09 ENCOUNTER — Encounter: Payer: Self-pay | Admitting: Internal Medicine

## 2010-11-10 ENCOUNTER — Ambulatory Visit (INDEPENDENT_AMBULATORY_CARE_PROVIDER_SITE_OTHER): Payer: Medicare Other | Admitting: Internal Medicine

## 2010-11-10 ENCOUNTER — Encounter: Payer: Self-pay | Admitting: Internal Medicine

## 2010-11-10 VITALS — BP 158/82 | HR 89 | Temp 98.1°F | Ht 63.0 in | Wt 151.6 lb

## 2010-11-10 DIAGNOSIS — R0609 Other forms of dyspnea: Secondary | ICD-10-CM

## 2010-11-10 DIAGNOSIS — R0989 Other specified symptoms and signs involving the circulatory and respiratory systems: Secondary | ICD-10-CM

## 2010-11-10 DIAGNOSIS — J45909 Unspecified asthma, uncomplicated: Secondary | ICD-10-CM

## 2010-11-10 DIAGNOSIS — R0600 Dyspnea, unspecified: Secondary | ICD-10-CM

## 2010-11-10 DIAGNOSIS — D869 Sarcoidosis, unspecified: Secondary | ICD-10-CM

## 2010-11-10 MED ORDER — PANTOPRAZOLE SODIUM 40 MG PO TBEC: DELAYED_RELEASE_TABLET | ORAL | Status: DC

## 2010-11-10 NOTE — Assessment & Plan Note (Signed)
DDX of  difficult airways managment all start with A and  include Adherence, Ace Inhibitors, Acid Reflux, Active Sinus Disease, Alpha 1 Antitripsin deficiency, Anxiety masquerading as Airways dz,  ABPA,  allergy(esp in young), Aspiration (esp in elderly), Adverse effects of DPI,  Active smokers, plus two Bs  = Bronchiectasis and Beta blocker use..and one C= CHF   She has known gerd and has some features suggestive of atypical upper airway symptoms from occult reflux, perhaps related to wt gain  See the written copy of this report in the patient's paper medical record.  These results did not interface directly into the electronic medical record and are summarized here.

## 2010-11-10 NOTE — Progress Notes (Signed)
  Subjective:    Patient ID: Brenda [name], female    DOB: 29-Dec-1930, 77 y.o.   MRN: [id number]  HPI  89 yowf never smoked dx with sarcoid around 1990 eval by Seymour Bars with skin involvement   rx with prednisone x months only and no recurrence since then and never back on chronic steroids referred to pulmonary clinic for abn ct chest  11/10/2010 Initial pulmonary office eval in EMR era cc worse ex tol since summer of 2011 esp in heat assoc with about 15 lb wt gain but does ok walking flat in cool area assoc with eyes dry and cough esp if outside.  No real progression of symptoms which were indolent in onset. No nocturnal spells, ocular or articular complaints or rash.  Pt denies any significant sore throat, dysphagia, itching, sneezing,  nasal congestion or excess/ purulent secretions,  fever, chills, sweats, unintended wt loss, pleuritic or exertional cp, hempoptysis, orthopnea pnd or leg swelling.    Also denies any obvious fluctuation of symptoms with weather or environmental changes or other aggravating or alleviating factors.     .    Review of Systems  Constitutional: Negative for fever, chills and unexpected weight change.  HENT: Positive for ear pain and rhinorrhea. Negative for nosebleeds, congestion, sore throat, sneezing, trouble swallowing, dental problem, voice change, postnasal drip and sinus pressure.   Eyes: Negative for visual disturbance.  Respiratory: Positive for shortness of breath. Negative for cough and choking.   Cardiovascular: Negative for chest pain and leg swelling.  Gastrointestinal: Negative for vomiting, abdominal pain and diarrhea.  Genitourinary: Negative for difficulty urinating.  Musculoskeletal: Negative for arthralgias.  Skin: Negative for rash.  Neurological: Negative for tremors, syncope and headaches.  Hematological: Bruises/bleeds easily.       Objective:   Physical Exam slt anxious amb wf nad Wt 151 11/10/2010  HEENT: nl dentition,  turbinates, and orophanx. Nl external ear canals without cough reflex   NECK :  without JVD/Nodes/TM/ nl carotid upstrokes bilaterally   LUNGS: no acc muscle use, clear to A and P bilaterally without cough on insp or exp maneuvers   CV:  RRR  no s3 or murmur or increase in P2, no edema   ABD:  soft and nontender with nl excursion in the supine position. No bruits or organomegaly, bowel sounds nl  MS:  warm without deformities, calf tenderness, cyanosis or clubbing  SKIN: warm and dry without lesions    NEURO:  alert, approp, no deficits      CTangiogram reviewed from 11/05/10  Mediastinal and bilateral hilar adenopathy compatible with the  patient's the given history of sarcoidosis. Lungs are clear.  Coronary artery disease.    Assessment & Plan:

## 2010-11-10 NOTE — Assessment & Plan Note (Addendum)
Sarcoid changes on CT are likley very longstanding ( though admittedly we can't do comparisons to previous cts)and do not explain any of her symtoms and would not rec further ct screening now that we have excluded occult pe and ild.  Her cxr shows classic stage I sarcoid and she has no symptoms to suggest recurrent systemic dz.

## 2010-11-10 NOTE — Patient Instructions (Addendum)
Protonix 40 mg  Take 30- 60 min before your first and last meals of the day   GERD (REFLUX)  is an extremely common cause of respiratory symptoms, many times with no significant heartburn at all.    It can be treated with medication, but also with lifestyle changes including avoidance of late meals, excessive alcohol, smoking cessation, and avoid fatty foods, chocolate, peppermint, colas, red wine, and acidic juices such as orange juice.  NO MINT OR MENTHOL PRODUCTS SO NO COUGH DROPS  USE SUGARLESS CANDY INSTEAD (jolley ranchers or Stover's)  NO OIL BASED VITAMINS (no fish oil)   Please schedule a follow up office visit in 4 weeks, sooner if needed for PFT's with all meds in two bags  Your daily no matter what's vs what ifs that you stop if you feel

## 2010-11-19 ENCOUNTER — Other Ambulatory Visit: Payer: Self-pay | Admitting: *Deleted

## 2010-11-19 DIAGNOSIS — I10 Essential (primary) hypertension: Secondary | ICD-10-CM

## 2010-11-19 MED ORDER — AMLODIPINE BESYLATE 5 MG PO TABS: 5.0000 mg | ORAL_TABLET | Freq: Every day | ORAL | Status: DC

## 2010-12-08 ENCOUNTER — Encounter: Payer: Self-pay | Admitting: Internal Medicine

## 2010-12-08 NOTE — Assessment & Plan Note (Signed)
Brenda [name]                         GASTROENTEROLOGY OFFICE NOTE   Brenda [name], Brenda [name]                  MRN:          [id number]  DATE:10/09/2007                            DOB:          [birth year]    Brenda [name] is a 77 year old tall white female with history of  gastroesophageal reflux disease, gastric ulcer, positive family history  of colon cancer in her sister, recently diagnosed  with Helicobacter  pylori gastritis and Barrett's esophagus on endoscopy in January 2009.  She also has a new onset diarrhea which started at the same time as she  started  her omeprazole 200 mg a day and the Helicobacter regimen of  Prevpac.  She is coming today because she has been having lot of  diarrhea, some at night, is 3 to 4 bowel movements a day.  Her usual  bowel habits have been constipated.  She likes the effect of the  omeprazole  20 mg daily but feels that it may be causing her diarrhea as  well.  At the same time, she has been under a great deal of stress  taking care of her daughter's baby and having 2 family members who lost  their jobs.  She and her husband are helping them.  She is very anxious  and nervous and unable to sleep.  It is not clear whether her symptoms  are combination of infection, drug effect, or irritable bowel syndrome.   MEDICATIONS:  1. Omeprazole  20 mg q. day.  2. Restasis q. day.  3. Alprazolam 0.5 mg b.i.d. and 1-1/2 mg at bedtime.  4. Pravastatin.  5. Triamterene/Hydrochlorothiazide 1 q. Day.  6. Multiple vitamins.  7. Fish oil.  8. Vitamin B.   PHYSICAL EXAMINATION:  Blood pressure 120/72.  Pulse 80.  Weight 148  pounds.  She was very anxious and teary-eyed.  LUNGS:  Clear to auscultation.  COR:  With normal S1 and S2,  ABDOMEN:  Was protuberant but soft.  Diffusely tender, more so in the  left middle quadrant.  No hyperactive bowel sounds.  No tympany.  Liver  edge at costal margin.  __________. Hemoccult negative  stool.   IMPRESSION:  A 77 year old white female under a great deal of stress  having new onset diarrhea.  Rule out antibiotic related diarrhea, rule  out irritable bowel syndrome, rule out omeprazole -induced diarrhea.   PLAN:  1. Flagyl 250 p.o. t.i.d. x10 days.  2. Increase Xanax to 1 mg b.i.d.  3. Elavil 150 mg q.h.s.  4. Discontinue omeprazole and start Pepcid 40 mg p.o. b.i.d. for at      least temporarily until      diarrhea resolves.  5. Office visit 6 weeks to reassess her symptomatology.     Hedwig Morton. Juanda Chance, MD  Electronically Signed    DMB/MedQ  DD: 10/09/2007  DT: 10/09/2007  Job #: [id number]   cc:   Caryn Bee L. Little, M.D.

## 2010-12-10 ENCOUNTER — Other Ambulatory Visit (INDEPENDENT_AMBULATORY_CARE_PROVIDER_SITE_OTHER): Payer: Medicare Other

## 2010-12-10 ENCOUNTER — Ambulatory Visit (INDEPENDENT_AMBULATORY_CARE_PROVIDER_SITE_OTHER): Payer: Medicare Other | Admitting: Internal Medicine

## 2010-12-10 ENCOUNTER — Encounter: Payer: Self-pay | Admitting: Internal Medicine

## 2010-12-10 VITALS — BP 114/60 | HR 73 | Temp 98.0°F | Ht 64.0 in | Wt 150.0 lb

## 2010-12-10 DIAGNOSIS — R0989 Other specified symptoms and signs involving the circulatory and respiratory systems: Secondary | ICD-10-CM

## 2010-12-10 DIAGNOSIS — D869 Sarcoidosis, unspecified: Secondary | ICD-10-CM

## 2010-12-10 DIAGNOSIS — R0609 Other forms of dyspnea: Secondary | ICD-10-CM

## 2010-12-10 DIAGNOSIS — R0600 Dyspnea, unspecified: Secondary | ICD-10-CM

## 2010-12-10 LAB — CBC WITH DIFFERENTIAL/PLATELET
Basophils Relative: 0.1 % (ref 0.0–3.0)
Eosinophils Absolute: 0.1 10*3/uL (ref 0.0–0.7)
Eosinophils Relative: 2.1 % (ref 0.0–5.0)
Hemoglobin: 12.9 g/dL (ref 12.0–15.0)
MCHC: 35.1 g/dL (ref 30.0–36.0)
MCV: 94.7 fl (ref 78.0–100.0)
Monocytes Absolute: 0.6 10*3/uL (ref 0.1–1.0)
Neutro Abs: 3.5 10*3/uL (ref 1.4–7.7)
RBC: 3.88 Mil/uL (ref 3.87–5.11)

## 2010-12-10 LAB — BASIC METABOLIC PANEL
BUN: 16 mg/dL (ref 6–23)
CO2: 30 mEq/L (ref 19–32)
GFR: 55.11 mL/min — ABNORMAL LOW (ref >60.00)
Glucose, Bld: 104 mg/dL — ABNORMAL HIGH (ref 70–99)
Potassium: 3.4 mEq/L — ABNORMAL LOW (ref 3.5–5.1)

## 2010-12-10 LAB — PULMONARY FUNCTION TEST

## 2010-12-10 NOTE — Progress Notes (Signed)
PFT done today. 

## 2010-12-10 NOTE — Patient Instructions (Addendum)
Your lung function is great and does not explain your symptoms which may be related to reflux or a heart issue ( you have a murmur)   You do have minimal lymph nodes on your CT which is consistent with a history of sarcoid but no evidence of activity  Rec full work up to include a cardiac work up by Dr [employer] team  NO MORE OIL BASED VITAMINS   Pulmonary follow up can be as needed

## 2010-12-10 NOTE — Progress Notes (Signed)
   Subjective:    Patient ID: Brenda [name], female    DOB: 1931-04-14, 77 y.o.   MRN: [id number]  HPI  7 yowf never smoked dx with sarcoid around 1990 eval by Seymour Bars with skin involvement   rx with prednisone x months only and no recurrence since then and never back on chronic steroids referred to pulmonary clinic for abn ct chest  11/10/2010 Initial pulmonary office eval in EMR era cc worse ex tol since summer of 2009  esp in heat assoc with about 15 lb wt gain but does ok walking flat in cool area assoc with eyes dry and cough esp if outside.   Def progression for several summers to point where cut down on  up tennis in the summer of 2011of symptoms which were indolent in onset. No nocturnal spells, ocular or articular complaints or rash.  Imp was atypical dyspnea ? Upper airway source. rec Protonix 40 mg  Take 30- 60 min before your first and last meals of the day   GERD (REFLUX)diet   12/10/2010 ov/ Brenda [name] cc no change doe, no cough. Pt denies any significant sore throat, dysphagia, itching, sneezing,  nasal congestion or excess/ purulent secretions,  fever, chills, sweats, unintended wt loss, pleuritic or exertional cp, hempoptysis, orthopnea pnd or leg swelling.    Also denies any obvious fluctuation of symptoms with weather or environmental changes or other aggravating or alleviating factors.  Sleeping ok without nocturnal  or early am exac of resp c/o's    .     Marland Kitchen          Objective:   Physical Exam slt anxious amb wf nad Wt 151 11/10/2010  > 150 12/10/2010  HEENT: nl dentition, turbinates, and orophanx. Nl external ear canals without cough reflex   NECK :  without JVD/Nodes/TM/ nl carotid upstrokes bilaterally   LUNGS: no acc muscle use, clear to A and P bilaterally without cough on insp or exp maneuvers   CV:  RRR  no s3  But there is a II - III/V! Systolic ej  murmur no assoc  increase in P2, no edema   ABD:  soft and nontender with nl excursion in the supine  position. No bruits or organomegaly, bowel sounds nl  MS:  warm without deformities, calf tenderness, cyanosis or clubbing  SKIN: warm and dry without lesions    NEURO:  alert, approp, no deficits      CTangiogram reviewed from 11/05/10  Mediastinal and bilateral hilar adenopathy compatible with the  patient's the given history of sarcoidosis. Lungs are clear.  Coronary artery disease.    Assessment & Plan:

## 2010-12-10 NOTE — Assessment & Plan Note (Signed)
I had an extended discussion with the patient and husband  today lasting 15 to 20 minutes of a 25 minute visit on the following issues:   cxr and CT scan are c/w burned out sarcoid with no evidence by pft's of a functional impact, so this many years out no further f/u needed

## 2010-12-10 NOTE — Assessment & Plan Note (Signed)
W/u does not show a pulmonary issue and she does have a sign systolic murmur so referred back to Dr Deborah Chalk

## 2010-12-11 ENCOUNTER — Other Ambulatory Visit: Payer: Self-pay | Admitting: Cardiology

## 2010-12-11 ENCOUNTER — Telehealth: Payer: Self-pay | Admitting: Cardiology

## 2010-12-11 DIAGNOSIS — R011 Cardiac murmur, unspecified: Secondary | ICD-10-CM

## 2010-12-11 DIAGNOSIS — R0602 Shortness of breath: Secondary | ICD-10-CM

## 2010-12-11 NOTE — Telephone Encounter (Signed)
PT SAID BEEN AWHILE SINCE SHE HAS BEEN HERE. SEEING DR Sherene Sires AND SOME INFO HAS COME UP CARDIOLOGY WISE THAT SHE NEEDS TO SHARE. CHART IN BOX.

## 2010-12-11 NOTE — Op Note (Signed)
Brenda [name], Brenda [name]                ACCOUNT NO.:  [id number]   MEDICAL RECORD NO.:  [id number]          PATIENT TYPE:  OUT   LOCATION:  CARD                         FACILITY:  [facility]   PHYSICIAN:  Oley Balm. Sung Amabile, M.D. [facility] BIRTH:  06-Sep-1930   DATE OF PROCEDURE:  08/24/2004  DATE OF DISCHARGE:  08/24/2004                                 OPERATIVE REPORT   PROCEDURE:  Cardiopulmonary stress test.   INDICATIONS FOR TESTING:  Exertional dyspnea.   PROCEDURE:  Cardiopulmonary stress testing was performed on a graded  treadmill. Testing was stopped due to dyspnea and heart rate goal. Effort  was maximal. At peak exercise, oxygen uptake was 1.84 liters per minute or  114% of predicted maximum indicating normal exercise tolerance.   At peak exercise, heart rate was 167 or 112% of predicted maximum indicating  that cardiovascular limitation was reached. Oxygen pulse was normal  suggesting normal left ventricular function. Blood pressure response was  abnormal with a peak blood pressure of 215/77.  EKG tracings revealed no  ischemia and no arrhythmias.   At peak exercise, minute ventilation was 67 liters per minute or 75% of  maximum voluntary ventilation indicating that ventilatory limitation was  approached but not reached. Gas exchange parameters revealed no  abnormalities. Baseline pulmonary function tests revealed normal spirometry,  lung volumes and diffuse capacity. Postexercise spirometry revealed no  exercise-induced bronchospasm.   SUMMARY:  Normal exercise tolerance as predicted by age and gender. Normal  cardiopulmonary response to exercise except for hypertension with a peak  blood pressure of 215/77. Otherwise no evidence of cardiopulmonary  pathology.      DBS/MEDQ  D:  09/13/2004  T:  09/14/2004  Job:  [id number]   cc:   Joni Fears D. Maple Hudson, M.D.

## 2010-12-11 NOTE — Telephone Encounter (Addendum)
Pt recently was seen by Dr. Sherene Sires who requested pt f/u with Dr. Deborah Chalk.  Dr. Deborah Chalk notified and suggested pt get an echo and office visit.  RN set pt up for echo on 12/15/10 and office visit on 01/01/11.  Pt notified of both appointments.

## 2010-12-11 NOTE — Assessment & Plan Note (Signed)
[facility]                               PULMONARY OFFICE NOTE   NAME:Brenda [name], Brenda [name]                  MRN:          [id number]  DATE:04/01/2006                            DOB:          [birth year]    PROBLEM LIST:  1. Exertional dyspnea and heat intolerance.  2. Possible autonomic nervous system instability.  3. Asthma.  4. History of sarcoidosis/adenopathy.   HISTORY:  Comes today with her husband reporting that she has not played  tennis all summer because it was too hot and she wished to stay indoors and  that the sun was too bright.  When she was here in July, she was wanting me  to do allergy testing.  That issue was dropped.  Her husband today focused  on the protocol from an alternative medicine source and he brings some  papers to be read.  She complains additionally of some chronic pains in the  left side of her neck which sometimes extend up along the left temple.   OBJECTIVE:  VITAL SIGNS:  Weight 152 pounds, BP 156/70, pulse regular 84,  room air saturation 95%.  HEENT:  Pupils are equal and reactive.  Speech is clear.  Nasopharynx is  clear.  NEUROLOGIC:  Unremarkable to observation.  SKIN:  Without rash.  I find no adenopathy.  LUNGS:  Clear to P&A without cough or wheeze.  HEART:  Sounds are regular without murmur or gallop.  I do not find  enlargement of the liver or spleen.   A chest CT in May, at [facility], described considerable hilar  and mediastinal adenopathy with a broad differential including sarcoid.  A  CT scan of the neck in May, had shown no focal left sided neck mass but did  demonstrate her adenopathy.  Pulmonary function tests on January 23, 2006, were  done at the cardiopulmonary exercise laboratory where spirometry was normal.  MVV was normal indicating good exercise reserve.  There was little response  to bronchodilator.  She had some peak flow flattening which is probably  effort related but  the pattern was persistent, so some degree of dynamic  expiratory collapse is not excluded.  She had good functional capability on  cardiopulmonary testing but noting a 12% decline from a previous study done  in January 2006, possibly due to de-training and question of a mild  component of diastolic dysfunction.  She had an ACE level on Nov 25, 2005,  which was within normal limits at 28 (12-68).  A CBC and chemistry were  normal.   MEDICATIONS:  1. Metoprolol 25 mg.  2. Vytorin 10/20.  3. Restasis drops.  4. Patanol drops.  5. Alprazolam 0.5 mg q.i.d.  6. Blephamide.  7. Prilosec.  8. TriCor.  9. Triamterene/HCTZ.  10.Albuterol inhaler.   NO MEDICATION ALLERGY.   IMPRESSION:  1. She has had persistent adenopathy attributed to sarcoid with no      evidence of a progressive disease.  2. Probable arthritic pains, left neck.  3. Nonspecific constitutional symptoms.   PLAN:  1. The lymphadenopathy is objective on imaging but has  not shown [place]      progressive disease.  I am going to offer a followup CT in 4 months      looking for evidence of progression and we can repeat an ACE level at      that time.  2. I am not aware of the scientific basis for the studies that she brought      to show me and would leave it to her to find somebody who is      experienced in that schooling.  I did suggest that she might consider      an executive physical at a place like [facility] in [place].  I      also offered referral to sarcoid research facility at one of the state      universities.  3. We have not set a definite return.                                   Clinton D. Maple Hudson, MD, FCCP, FACP   CDY/MedQ  DD:  04/01/2006  DT:  04/02/2006  Job #:  [id number]   cc:   Caryn Bee L. Little, M.D.  Colleen Can. Deborah Chalk, M.D.  Michelle L. Vincente Poli, M.D.

## 2010-12-15 ENCOUNTER — Ambulatory Visit (HOSPITAL_COMMUNITY): Payer: Medicare Other | Attending: Cardiology

## 2010-12-15 DIAGNOSIS — R0989 Other specified symptoms and signs involving the circulatory and respiratory systems: Secondary | ICD-10-CM

## 2010-12-15 DIAGNOSIS — R011 Cardiac murmur, unspecified: Secondary | ICD-10-CM

## 2010-12-15 DIAGNOSIS — R0602 Shortness of breath: Secondary | ICD-10-CM

## 2010-12-15 DIAGNOSIS — I079 Rheumatic tricuspid valve disease, unspecified: Secondary | ICD-10-CM | POA: Insufficient documentation

## 2010-12-15 DIAGNOSIS — R0609 Other forms of dyspnea: Secondary | ICD-10-CM | POA: Insufficient documentation

## 2010-12-15 DIAGNOSIS — I379 Nonrheumatic pulmonary valve disorder, unspecified: Secondary | ICD-10-CM | POA: Insufficient documentation

## 2010-12-15 DIAGNOSIS — I059 Rheumatic mitral valve disease, unspecified: Secondary | ICD-10-CM | POA: Insufficient documentation

## 2010-12-17 ENCOUNTER — Encounter: Payer: Self-pay | Admitting: Internal Medicine

## 2010-12-23 ENCOUNTER — Telehealth: Payer: Self-pay | Admitting: Cardiology

## 2010-12-23 NOTE — Telephone Encounter (Signed)
Wants RN to call her back with the echo results.

## 2010-12-23 NOTE — Telephone Encounter (Signed)
Pt notified of echo results and will f/u with Dr. Deborah Chalk on 01/01/11 for continued shortness of breath.

## 2010-12-25 ENCOUNTER — Encounter: Payer: Self-pay | Admitting: Internal Medicine

## 2010-12-31 ENCOUNTER — Encounter: Payer: Self-pay | Admitting: Cardiology

## 2011-01-01 ENCOUNTER — Ambulatory Visit (INDEPENDENT_AMBULATORY_CARE_PROVIDER_SITE_OTHER): Payer: Medicare Other | Admitting: Cardiology

## 2011-01-01 ENCOUNTER — Encounter: Payer: Self-pay | Admitting: Cardiology

## 2011-01-01 DIAGNOSIS — I359 Nonrheumatic aortic valve disorder, unspecified: Secondary | ICD-10-CM

## 2011-01-01 DIAGNOSIS — I350 Nonrheumatic aortic (valve) stenosis: Secondary | ICD-10-CM

## 2011-01-01 DIAGNOSIS — I519 Heart disease, unspecified: Secondary | ICD-10-CM

## 2011-01-01 DIAGNOSIS — R0989 Other specified symptoms and signs involving the circulatory and respiratory systems: Secondary | ICD-10-CM

## 2011-01-01 DIAGNOSIS — R0609 Other forms of dyspnea: Secondary | ICD-10-CM

## 2011-01-01 DIAGNOSIS — I5189 Other ill-defined heart diseases: Secondary | ICD-10-CM

## 2011-01-01 DIAGNOSIS — R0600 Dyspnea, unspecified: Secondary | ICD-10-CM

## 2011-01-03 ENCOUNTER — Encounter: Payer: Self-pay | Admitting: Cardiology

## 2011-01-03 NOTE — Progress Notes (Signed)
Subjective:   Brenda [name] comes in today for followup visit.  She has a history of chronic shortness of breath as well as chest discomfort.she is had a history of mild to moderate coronary atherosclerosis dating 52. She's had a question of mild increase in right heart pressures. A q.d. Echocardiogram was done on Dec 22, 2010 which shows normal left ventricular function ejection fraction of 55-60% with grade 1 diastolic dysfunction. She did have mild aortic stenosis. Pulmonary artery peak pressure was estimated to be at 35 mm mercury. As noted to have a small pericardial effusion.  She's been felt to have sarcoid as well as exercise-induced asthma. She does have history of hypercholesterolemia. As part of her evaluation, she had a cardiopulmonary exercise lab with conclusion be excellent functional capacity is somewhat hypertensive response to exercise suggesting of diastolic dysfunction. There's also questional flattened expiratory flow-volume curve suggesting upper airway obstruction.  In any case, she's been more limited recently by her arthritic complaints but she is anxious to get back playing tennis. Any chest pain symptoms have basically resolved. She is limited in in part by some arthritic complaints in her neck.  Current Outpatient Prescriptions  Medication Sig Dispense Refill  . Acetaminophen (TYLENOL ARTHRITIS EXT RELIEF PO) 2 tablets every 12 hours      . ALPRAZolam (XANAX) 0.5 MG tablet 2 tablets every am and 3 tablets at bedtime      . amLODipine (NORVASC) 5 MG tablet Take 1 tablet (5 mg total) by mouth daily.  30 tablet  2  . cetirizine (ZYRTEC) 10 MG tablet Take 10 mg by mouth daily.        . cycloSPORINE (RESTASIS) 0.05 % ophthalmic emulsion As directed       . ketotifen (ZADITOR) 0.025 % ophthalmic solution Place 1 drop into both eyes 2 (two) times daily.        Marland Kitchen loteprednol (LOTEMAX) 0.2 % SUSP 1 drop every 21 ( twenty-one) days.        . Multiple Vitamin (MULTIVITAMIN) capsule  Take 1 capsule by mouth daily.        . pantoprazole (PROTONIX) 40 MG tablet Take 30- 60 min before your first and last meals of the day  60 tablet  2  . Polyethyl Glycol-Propyl Glycol (SYSTANE OP) As directed       . pravastatin (PRAVACHOL) 80 MG tablet Take 1 tablet by mouth daily.      Marland Kitchen senna (SENOKOT) 8.6 MG tablet 3 at bedtime       . tizanidine (ZANAFLEX) 2 MG capsule Take 2 mg by mouth 3 (three) times daily. As directed       . triamterene-hydrochlorothiazide (MAXZIDE) 75-50 MG per tablet Take 1 tablet by mouth daily.        . vitamin B-12 (CYANOCOBALAMIN) 1000 MCG tablet Take 1,000 mcg by mouth daily.          No Known Allergies  Patient Active Problem List  Diagnoses  . Sarcoidosis  . HYPERLIPIDEMIA  . ANXIETY  . HYPERTENSION  . HEMORRHOIDS  . GERD  . BARRETTS ESOPHAGUS  . IRRITABLE BOWEL SYNDROME  . HEMATEMESIS  . DEGENERATIVE JOINT DISEASE  . OTHER DYSPHAGIA  . COLONIC POLYPS, ADENOMATOUS, HX OF  . HELICOBACTER PYLORI GASTRITIS, HX OF  . HYPOTHYROIDISM  . CONSTIPATION  . FLATULENCE-GAS-BLOATING  . ABDOMINAL PAIN-LUQ  . ABDOMINAL PAIN-LLQ  . Dyspnea    History  Smoking status  . Never Smoker   Smokeless tobacco  . Never Used  History  Alcohol Use  . Yes    Occ glass of wine with dinner    Family History  Problem Relation Age of Onset  . Hypertension Father   . Hyperlipidemia Father   . Rheum arthritis Father   . Heart failure Father   . Hypertension Mother   . Hyperlipidemia Mother   . Kidney cancer Mother   . Lymphoma Mother   . Hypertension Sister   . Hypertension Brother   . Asthma Sister   . Thyroid cancer Sister     Review of Systems:   The patient denies any heat or cold intolerance.  No weight gain or weight loss.  The patient denies headaches or blurry vision.  There is no cough or sputum production.  The patient denies dizziness.  There is no hematuria or hematochezia.  The patient denies any muscle aches or arthritis.  The  patient denies any rash.  The patient denies frequent falling or instability.  There is no history of depression or anxiety.  All other systems were reviewed and are negative.   Physical Exam:   She is a pleasant elderly female in no acute distress. Weight is 149. Blood pressure is 112 or 76. Heart rate 64.The head is normocephalic and atraumatic.  Pupils are equally round and reactive to light.  Sclerae nonicteric.  Conjunctiva is clear.  Oropharynx is unremarkable.  There's adequate oral airway.  Neck is supple there are no masses.  Thyroid is not enlarged.  There is no lymphadenopathy.  Lungs are clear.  Chest is symmetric.  Heart shows a regular rate and rhythm.  S1 and S2 are normal.  There isa soft systolic outflow murmur.  Abdomen is soft normal bowel sounds.  There is no organomegaly.  Genital and rectal deferred.  Extremities are without edema.  Peripheral pulses are adequate.  Neurologically intact.  Full range of motion.  The patient is not depressed.  Skin is warm and dry.  Assessment / Plan:

## 2011-01-03 NOTE — Assessment & Plan Note (Signed)
She has chronic dyspnea this been hard to figure out. If there is a cardiac component, it seems like it is multifactorial. She does have diastolic dysfunction. She does have a mean gradient across her aortic valve of 16 mm. She does have mild pulmonary hypertension. We have not done a recent heart catheterization in may have Dr. Gala Romney consider that. She had a normal stress Cardiolite study in September 2009.she will see Dr. Gala Romney in followup

## 2011-01-18 ENCOUNTER — Ambulatory Visit (HOSPITAL_COMMUNITY): Payer: Medicare Other | Attending: Cardiology | Admitting: Radiology

## 2011-01-18 DIAGNOSIS — R0989 Other specified symptoms and signs involving the circulatory and respiratory systems: Secondary | ICD-10-CM | POA: Insufficient documentation

## 2011-01-18 DIAGNOSIS — R0600 Dyspnea, unspecified: Secondary | ICD-10-CM

## 2011-01-18 DIAGNOSIS — R0602 Shortness of breath: Secondary | ICD-10-CM

## 2011-01-18 DIAGNOSIS — R0609 Other forms of dyspnea: Secondary | ICD-10-CM | POA: Insufficient documentation

## 2011-01-18 DIAGNOSIS — I519 Heart disease, unspecified: Secondary | ICD-10-CM | POA: Insufficient documentation

## 2011-01-18 DIAGNOSIS — I350 Nonrheumatic aortic (valve) stenosis: Secondary | ICD-10-CM

## 2011-01-18 DIAGNOSIS — R079 Chest pain, unspecified: Secondary | ICD-10-CM

## 2011-01-18 DIAGNOSIS — I5189 Other ill-defined heart diseases: Secondary | ICD-10-CM

## 2011-01-18 DIAGNOSIS — I359 Nonrheumatic aortic valve disorder, unspecified: Secondary | ICD-10-CM | POA: Insufficient documentation

## 2011-01-18 MED ORDER — TECHNETIUM TC 99M TETROFOSMIN IV KIT
11.0000 | PACK | Freq: Once | INTRAVENOUS | Status: AC | PRN
  Administered 2011-01-18: 11 via INTRAVENOUS

## 2011-01-18 MED ORDER — TECHNETIUM TC 99M TETROFOSMIN IV KIT
33.0000 | PACK | Freq: Once | INTRAVENOUS | Status: AC | PRN
  Administered 2011-01-18: 33 via INTRAVENOUS

## 2011-01-18 MED ORDER — REGADENOSON 0.4 MG/5ML IV SOLN
0.4000 mg | Freq: Once | INTRAVENOUS | Status: AC
  Administered 2011-01-18: 0.4 mg via INTRAVENOUS

## 2011-01-18 NOTE — Progress Notes (Signed)
[facility] SITE 3 NUCLEAR MED [address] [place] [address] [phone]  Cardiology Nuclear Med Study  Dave Lucyann Romano is a 77 y.o. female [id number] Feb 27, 1931   Nuclear Med Background Indication for Stress Test:  Evaluation for Ischemia History:  Asthma,12/22/10 Echo:EF=55-60%,mild AS,98 Heart Catheterization:mild and nonobstructive plaque and 09 Myocardial Perfusion Study:EF=80% and normal Cardiac Risk Factors: Hypertension and Lipids  Symptoms:  Chest Pain(chronic), Fatigue(chronic), Palpitations, Rapid HR and SOB(chronic)   Nuclear Pre-Procedure Caffeine/Decaff Intake:  None NPO After: 7:00pm   Lungs: clear IV 0.9% NS with Angio Cath:  20g  IV Site: R Hand  IV Started by:  Cathlyn Parsons, RN  Chest Size (in):  38 Cup Size: B  Height: 5\' 3"  (1.6 m)  Weight:  149 lb (67.586 kg)  BMI:  Body mass index is 26.39 kg/(m^2). Tech Comments:  n/a    Nuclear Med Study 1 or 2 day study: 1 day  Stress Test Type:  Treadmill/Lexiscan  Reading MD: Dietrich Pates, MD  Order Authorizing Provider:  Truman Hayward  Resting Radionuclide: Technetium 63m Tetrofosmin  Resting Radionuclide Dose: 11.0 mCi   Stress Radionuclide:  Technetium 73m Tetrofosmin  Stress Radionuclide Dose: 33.0 mCi           Stress Protocol Rest HR: 73 Stress HR:112  Rest BP: 143/98 Stress BP: 154/75  Exercise Time (min): 2:00 METS: 2.2   Predicted Max HR: 143 bpm % Max HR: 78.32 bpm Rate Pressure Product: 21308   Dose of Adenosine (mg):  n/a Dose of Lexiscan: 0.4 mg  Dose of Atropine (mg): n/a Dose of Dobutamine: n/a mcg/kg/min (at max HR)  Stress Test Technologist: Cathlyn Parsons, RN  Nuclear Technologist:  Domenic Polite, CNMT     Rest Procedure:  Myocardial perfusion imaging was performed at rest 45 minutes following the intravenous administration of Technetium 2m Tetrofosmin. Rest ECG: NSR  Stress Procedure:  The patient received IV Lexiscan 0.4 mg over  15-seconds with concurrent low level exercise and then Technetium 63m Tetrofosmin was injected at 30-seconds while the patient continued walking one more minute.  There were no significant changes with Lexiscan.  No ectopy with Lexiscan.  Patient had chest tightness 4/10 with infusion but relieved in recovery.Quantitative spect images were obtained after a 45-minute delay. Stress ECG: No significant change from baseline ECG  QPS Raw Data Images:  Normal; no motion artifact; normal heart/lung ratio. Stress Images:  Normal homogeneous uptake in all areas of the myocardium. Rest Images:  Normal homogeneous uptake in all areas of the myocardium. Subtraction (SDS):  No evidence of ischemia. Transient Ischemic Dilatation (Normal <1.22):  0.89 Lung/Heart Ratio (Normal <0.45):  0.29  Quantitative Gated Spect Images QGS EDV:  55 ml QGS ESV:  11 ml QGS cine images:  NL LV Function; NL Wall Motion QGS EF: 80%  Impression Exercise Capacity:  Lexiscan with no exercise. BP Response:  Normal blood pressure response. Clinical Symptoms:  Mild chest tightness ECG Impression:  No significant ST segment change suggestive of ischemia. Comparison with Prior Nuclear Study: No significant change  Overall Impression:  Normal stress nuclear study.

## 2011-01-19 NOTE — Progress Notes (Addendum)
Nuc med report routed to Dr. Gala Romney 01/19/11 Domenic Polite  Normal  Daniel Bensimhon

## 2011-01-26 ENCOUNTER — Telehealth: Payer: Self-pay | Admitting: Internal Medicine

## 2011-01-26 NOTE — Progress Notes (Signed)
Pt aware Brenda [name] 5:39 PM

## 2011-01-26 NOTE — Telephone Encounter (Signed)
Pt aware of test results

## 2011-01-26 NOTE — Telephone Encounter (Signed)
Pt was referred to dr bensimhon from dr Deborah Chalk , wants results of stress test

## 2011-02-17 ENCOUNTER — Encounter: Payer: Self-pay | Admitting: Internal Medicine

## 2011-02-17 ENCOUNTER — Emergency Department (HOSPITAL_COMMUNITY): Payer: Medicare Other

## 2011-02-17 ENCOUNTER — Emergency Department (HOSPITAL_COMMUNITY)
Admission: EM | Admit: 2011-02-17 | Discharge: 2011-02-18 | Disposition: A | Discharge status: Home / Self Care | Payer: Medicare Other | Attending: Emergency Medicine | Admitting: Emergency Medicine

## 2011-02-17 DIAGNOSIS — S0083XA Contusion of other part of head, initial encounter: Secondary | ICD-10-CM | POA: Insufficient documentation

## 2011-02-17 DIAGNOSIS — I10 Essential (primary) hypertension: Secondary | ICD-10-CM | POA: Insufficient documentation

## 2011-02-17 DIAGNOSIS — R011 Cardiac murmur, unspecified: Secondary | ICD-10-CM | POA: Insufficient documentation

## 2011-02-17 DIAGNOSIS — E039 Hypothyroidism, unspecified: Secondary | ICD-10-CM | POA: Insufficient documentation

## 2011-02-17 DIAGNOSIS — Y92009 Unspecified place in unspecified non-institutional (private) residence as the place of occurrence of the external cause: Secondary | ICD-10-CM | POA: Insufficient documentation

## 2011-02-17 DIAGNOSIS — R51 Headache: Secondary | ICD-10-CM | POA: Insufficient documentation

## 2011-02-17 DIAGNOSIS — W1809XA Striking against other object with subsequent fall, initial encounter: Secondary | ICD-10-CM | POA: Insufficient documentation

## 2011-02-17 DIAGNOSIS — M542 Cervicalgia: Secondary | ICD-10-CM | POA: Insufficient documentation

## 2011-02-17 DIAGNOSIS — S0003XA Contusion of scalp, initial encounter: Secondary | ICD-10-CM | POA: Insufficient documentation

## 2011-02-17 DIAGNOSIS — E7800 Pure hypercholesterolemia, unspecified: Secondary | ICD-10-CM | POA: Insufficient documentation

## 2011-02-24 ENCOUNTER — Other Ambulatory Visit: Payer: Self-pay | Admitting: Cardiology

## 2011-02-24 DIAGNOSIS — I10 Essential (primary) hypertension: Secondary | ICD-10-CM

## 2011-02-24 MED ORDER — AMLODIPINE BESYLATE 5 MG PO TABS: 5.0000 mg | ORAL_TABLET | Freq: Every day | ORAL | Status: DC

## 2011-02-24 NOTE — Telephone Encounter (Signed)
Called needing a refill of her Amlodipine (Bystolic) prescription#: [id number] filled at [facility] at [facility] [phone]. No need to call back. I have pulled the chart.

## 2011-02-24 NOTE — Telephone Encounter (Signed)
escribe medication per fax request  

## 2011-03-18 ENCOUNTER — Other Ambulatory Visit: Payer: Self-pay | Admitting: Internal Medicine

## 2011-03-19 ENCOUNTER — Ambulatory Visit (INDEPENDENT_AMBULATORY_CARE_PROVIDER_SITE_OTHER): Payer: Medicare Other | Admitting: Internal Medicine

## 2011-03-19 ENCOUNTER — Encounter: Payer: Self-pay | Admitting: Internal Medicine

## 2011-03-19 VITALS — BP 138/78 | HR 75 | Ht 63.0 in | Wt 151.0 lb

## 2011-03-19 DIAGNOSIS — R0602 Shortness of breath: Secondary | ICD-10-CM | POA: Insufficient documentation

## 2011-03-19 DIAGNOSIS — I359 Nonrheumatic aortic valve disorder, unspecified: Secondary | ICD-10-CM

## 2011-03-19 NOTE — Assessment & Plan Note (Signed)
Mild stenosis per echo in May 2012, mean gradient 16 mmHg.  Will monitor with an echo yearly.

## 2011-03-19 NOTE — Assessment & Plan Note (Addendum)
Continues with very mild exertional dyspnea. Likely multi-factorial with pt's known sarcoid, mild diastolic dysfunction, age and mild aortic stenosis.  She is doing well with activity at this time.  Currently no further work up needed.  Continue current medications.

## 2011-03-19 NOTE — Progress Notes (Signed)
HPI:  Brenda [name] is a 77 year old female with history of mild to moderate coronary atheroscleorosis by cath in 1998, diastolic dysfunction, HL, mild aortic stenosis, osteoarthritis and sarcoidosis.  She was diagnosed by Dr. Seymour Bars in the early 90s with skin involvement, placed on steriods for a short amount of time and denies recurrence.    She has been evaluated by Dr. Sherene Sires with pulmonary for her dyspnea.  CT chest 10/28/10 showed mediastinal and bilateral hilar adenopathy compatible with the patient's given history of sarcoidosis. Lungs are clear.   PFT's essentially normal spirometry moderately decreased DLCO 56% predicted.     Echo 12/22/2010: nml LVEF 55-60%, Diastolic dysfunction, mild aortic stenosis with mean gradient 16 mmHg, PAPP 35 mmHg.  Lexiscan myoview on 01/18/2011 showed no evidence suggestive of ischemia.     She has been followed by Dr. Deborah Chalk until his retirement.  She returns for routine follow up today.  She is doing well.  She complains of dyspnea with exertion with tennis and high heat index.  She keeps her 8 year old granddaughter and is able to keep up with her without difficulty.  She denies, orthopnea, PND, edema or CP.  She denies syncope.  Her osteoarthritis does not limit her activity at this time.  She stretches daily.     ROS: All systems negative except as listed in HPI, PMH and Problem List.  Current Outpatient Prescriptions  Medication Sig Dispense Refill  . Acetaminophen (TYLENOL ARTHRITIS EXT RELIEF PO) 2 tablets every 12 hours      . ALPRAZolam (XANAX) 0.5 MG tablet 2 tablets every am and 3 tablets at bedtime      . amLODipine (NORVASC) 5 MG tablet Take 1 tablet (5 mg total) by mouth daily.  30 tablet  5  . calcium-vitamin D (OSCAL WITH D) 500-200 MG-UNIT per tablet Take 1 tablet by mouth daily.        . cetirizine (ZYRTEC) 10 MG tablet Take 10 mg by mouth daily.        . cycloSPORINE (RESTASIS) 0.05 % ophthalmic emulsion As directed       . ketotifen  (ZADITOR) 0.025 % ophthalmic solution Place 1 drop into both eyes as needed.       Marland Kitchen levothyroxine (SYNTHROID, LEVOTHROID) 75 MCG tablet Take 75 mcg by mouth daily.        . Multiple Vitamin (MULTIVITAMIN) capsule Take 1 capsule by mouth daily.        . pantoprazole (PROTONIX) 40 MG tablet TAKE 1 TABLET 30-60 MINUTES BEFORE YOUR FIRST AND LAST MEALS OF THE DAY  60 tablet  5  . Polyethyl Glycol-Propyl Glycol (SYSTANE OP) As directed       . pravastatin (PRAVACHOL) 80 MG tablet Take 1 tablet by mouth daily.      Marland Kitchen senna (SENOKOT) 8.6 MG tablet 3 at bedtime       . tizanidine (ZANAFLEX) 2 MG capsule Take 2 mg by mouth 2 (two) times daily. As directed      . vitamin B-12 (CYANOCOBALAMIN) 1000 MCG tablet Take 1,000 mcg by mouth as needed.         No Known Allergies   History  Smoking status  . Never Smoker   Smokeless tobacco  . Never Used    History  Alcohol Use  . Yes    Occ glass of wine with dinner    Past Medical History  Diagnosis Date  . Hypertension   . Hyperlipidemia   .  GERD (gastroesophageal reflux disease)   . Osteoarthritis   . Sarcoidosis   . Goiter   . Sicca   . Anxiety   . IBS (irritable bowel syndrome)   . Hemorrhoids   . DJD (degenerative joint disease)   . Hx of colonic polyps   . History of colonoscopy   . Asthma   . Barrett's esophagus   . History of shingles     face/eye    Current Outpatient Prescriptions  Medication Sig Dispense Refill  . Acetaminophen (TYLENOL ARTHRITIS EXT RELIEF PO) 2 tablets every 12 hours      . ALPRAZolam (XANAX) 0.5 MG tablet 2 tablets every am and 3 tablets at bedtime      . amLODipine (NORVASC) 5 MG tablet Take 1 tablet (5 mg total) by mouth daily.  30 tablet  5  . calcium-vitamin D (OSCAL WITH D) 500-200 MG-UNIT per tablet Take 1 tablet by mouth daily.        . cetirizine (ZYRTEC) 10 MG tablet Take 10 mg by mouth daily.        . cycloSPORINE (RESTASIS) 0.05 % ophthalmic emulsion As directed       . ketotifen  (ZADITOR) 0.025 % ophthalmic solution Place 1 drop into both eyes as needed.       Marland Kitchen levothyroxine (SYNTHROID, LEVOTHROID) 75 MCG tablet Take 75 mcg by mouth daily.        . Multiple Vitamin (MULTIVITAMIN) capsule Take 1 capsule by mouth daily.        . pantoprazole (PROTONIX) 40 MG tablet TAKE 1 TABLET 30-60 MINUTES BEFORE YOUR FIRST AND LAST MEALS OF THE DAY  60 tablet  5  . Polyethyl Glycol-Propyl Glycol (SYSTANE OP) As directed       . pravastatin (PRAVACHOL) 80 MG tablet Take 1 tablet by mouth daily.      Marland Kitchen senna (SENOKOT) 8.6 MG tablet 3 at bedtime       . tizanidine (ZANAFLEX) 2 MG capsule Take 2 mg by mouth 2 (two) times daily. As directed      . vitamin B-12 (CYANOCOBALAMIN) 1000 MCG tablet Take 1,000 mcg by mouth as needed.          PHYSICAL EXAM: Filed Vitals:   03/19/11 1130  BP: 138/78  Pulse: 75   General:  Well appearing. No resp difficulty HEENT: normal Neck: supple. JVP of 6. Carotids 2+ bilaterally; no bruits. No lymphadenopathy or thryomegaly appreciated. Cor: PMI normal. Regular rate & rhythm. 3/6 SEM. Lungs: clear Abdomen: soft, nontender, nondistended. No hepatosplenomegaly. No bruits or masses. Good bowel sounds. Extremities: no cyanosis, clubbing, rash, edema, DIP with herberdin nodes  Neuro: alert & orientedx3, cranial nerves grossly intact. Moves all 4 extremities w/o difficulty. Affect pleasant.   EKG: NSR at 75 bpm.  No acute changes  ASSESMENT/Plan:

## 2011-03-24 ENCOUNTER — Other Ambulatory Visit: Payer: Self-pay | Admitting: *Deleted

## 2011-03-24 DIAGNOSIS — I10 Essential (primary) hypertension: Secondary | ICD-10-CM

## 2011-03-24 MED ORDER — AMLODIPINE BESYLATE 5 MG PO TABS: 5.0000 mg | ORAL_TABLET | Freq: Every day | ORAL | Status: DC

## 2011-03-24 NOTE — Telephone Encounter (Signed)
escribe medication per fax request  

## 2011-04-06 NOTE — Progress Notes (Signed)
Addended by: Judithe Modest D on: 04/06/2011 02:31 PM   Modules accepted: Orders

## 2011-08-05 DIAGNOSIS — H1045 Other chronic allergic conjunctivitis: Secondary | ICD-10-CM | POA: Diagnosis not present

## 2011-08-05 DIAGNOSIS — H01009 Unspecified blepharitis unspecified eye, unspecified eyelid: Secondary | ICD-10-CM | POA: Diagnosis not present

## 2011-08-05 DIAGNOSIS — H5710 Ocular pain, unspecified eye: Secondary | ICD-10-CM | POA: Diagnosis not present

## 2011-08-05 DIAGNOSIS — H04129 Dry eye syndrome of unspecified lacrimal gland: Secondary | ICD-10-CM | POA: Diagnosis not present

## 2011-08-18 ENCOUNTER — Telehealth: Payer: Self-pay | Admitting: Internal Medicine

## 2011-08-18 NOTE — Telephone Encounter (Signed)
She can see someone else no HF, thanks

## 2011-08-18 NOTE — Telephone Encounter (Signed)
Will forward to Dr. Gala Romney & Herbert Seta for recommendations.

## 2011-08-18 NOTE — Telephone Encounter (Signed)
New problem:  Patient calling regarding recall to see, Dr. Gala Romney in Feb. Please advise on Heart Failure clinic or referral to another cardiologist.

## 2011-08-20 ENCOUNTER — Other Ambulatory Visit: Payer: Self-pay | Admitting: *Deleted

## 2011-08-20 DIAGNOSIS — I10 Essential (primary) hypertension: Secondary | ICD-10-CM

## 2011-08-20 MED ORDER — AMLODIPINE BESYLATE 5 MG PO TABS: 5.0000 mg | ORAL_TABLET | Freq: Every day | ORAL | Status: DC

## 2011-08-20 NOTE — Telephone Encounter (Signed)
Fax Received. Refill Completed. Christion Leonhard Chowoe (R.M.A)   

## 2011-09-16 DIAGNOSIS — H40059 Ocular hypertension, unspecified eye: Secondary | ICD-10-CM | POA: Diagnosis not present

## 2011-09-16 DIAGNOSIS — H5710 Ocular pain, unspecified eye: Secondary | ICD-10-CM | POA: Diagnosis not present

## 2011-09-16 DIAGNOSIS — H40019 Open angle with borderline findings, low risk, unspecified eye: Secondary | ICD-10-CM | POA: Diagnosis not present

## 2011-09-16 DIAGNOSIS — H18599 Other hereditary corneal dystrophies, unspecified eye: Secondary | ICD-10-CM | POA: Diagnosis not present

## 2011-09-16 DIAGNOSIS — H402290 Chronic angle-closure glaucoma, unspecified eye, stage unspecified: Secondary | ICD-10-CM | POA: Diagnosis not present

## 2011-09-16 DIAGNOSIS — H1045 Other chronic allergic conjunctivitis: Secondary | ICD-10-CM | POA: Diagnosis not present

## 2011-09-30 ENCOUNTER — Ambulatory Visit (HOSPITAL_COMMUNITY)
Admission: RE | Admit: 2011-09-30 | Discharge: 2011-09-30 | Disposition: A | Discharge status: Home / Self Care | Payer: Medicare Other | Source: Ambulatory Visit | Attending: Internal Medicine | Admitting: Internal Medicine

## 2011-09-30 VITALS — BP 142/70 | HR 82 | Wt 152.0 lb

## 2011-09-30 DIAGNOSIS — I10 Essential (primary) hypertension: Secondary | ICD-10-CM | POA: Diagnosis not present

## 2011-09-30 DIAGNOSIS — D869 Sarcoidosis, unspecified: Secondary | ICD-10-CM | POA: Insufficient documentation

## 2011-09-30 DIAGNOSIS — Z860100 Personal history of colon polyps, unspecified: Secondary | ICD-10-CM | POA: Insufficient documentation

## 2011-09-30 DIAGNOSIS — I2510 Atherosclerotic heart disease of native coronary artery without angina pectoris: Secondary | ICD-10-CM | POA: Insufficient documentation

## 2011-09-30 DIAGNOSIS — I43 Cardiomyopathy in diseases classified elsewhere: Secondary | ICD-10-CM | POA: Diagnosis not present

## 2011-09-30 DIAGNOSIS — I359 Nonrheumatic aortic valve disorder, unspecified: Secondary | ICD-10-CM

## 2011-09-30 DIAGNOSIS — F411 Generalized anxiety disorder: Secondary | ICD-10-CM | POA: Diagnosis not present

## 2011-09-30 DIAGNOSIS — M3500 Sicca syndrome, unspecified: Secondary | ICD-10-CM | POA: Insufficient documentation

## 2011-09-30 DIAGNOSIS — K219 Gastro-esophageal reflux disease without esophagitis: Secondary | ICD-10-CM | POA: Insufficient documentation

## 2011-09-30 DIAGNOSIS — R0602 Shortness of breath: Secondary | ICD-10-CM | POA: Diagnosis not present

## 2011-09-30 NOTE — Assessment & Plan Note (Signed)
Mild stenosis per echo in May 2012, mean gradient 16 mmHg. No functional change. Will repeat ECHO in May.

## 2011-09-30 NOTE — Progress Notes (Signed)
Patient ID: Brenda [name], female   DOB: 1931/02/23, 78 y.o.   MRN: [id number] HPI:  Brenda [name] is a 78 year old female with history of mild to moderate coronary atheroscleorosis by cath in 1998, diastolic dysfunction, HL, mild aortic stenosis, osteoarthritis and sarcoidosis.  She was diagnosed by Dr. Seymour Bars in the early 90s with skin involvement, placed on steriods for a short amount of time and denies recurrence.    She has been evaluated by Dr. Sherene Sires with pulmonary for her dyspnea.  CT chest 10/28/10 showed mediastinal and bilateral hilar adenopathy compatible with the patient's given history of sarcoidosis. Lungs are clear.   PFT's essentially normal spirometry moderately decreased DLCO 56% predicted.     Echo 12/22/2010: nml LVEF 55-60%, Diastolic dysfunction, mild aortic stenosis with mean gradient 16 mmHg, PAP 35 mmHg.  Lexiscan myoview on 01/18/2011 showed no evidence suggestive of ischemia.     She is here for follow up. Complains of SOB when she is exercising outside.  She denies, orthopnea, PND, edema or CP.  She denies syncope.  Complains of difficulty with allergies. She continues to keep her grandaughter. Complaint with medications. No longer plays tennis due dyspnea.   ROS: All systems negative except as listed in HPI, PMH and Problem List.  Current Outpatient Prescriptions  Medication Sig Dispense Refill  . Acetaminophen (TYLENOL ARTHRITIS EXT RELIEF PO) 2 tablets every 12 hours      . ALPRAZolam (XANAX) 0.5 MG tablet 2 tablets every am and 3 tablets at bedtime      . amLODipine (NORVASC) 5 MG tablet Take 1 tablet (5 mg total) by mouth daily.  30 tablet  5  . cetirizine (ZYRTEC) 10 MG tablet Take 10 mg by mouth daily.        . Cholecalciferol (VITAMIN D3) 1000 UNITS CAPS Take 1 capsule by mouth daily.      . cycloSPORINE (RESTASIS) 0.05 % ophthalmic emulsion As directed       . ketotifen (ZADITOR) 0.025 % ophthalmic solution Place 1 drop into both eyes as needed.       Marland Kitchen  levothyroxine (SYNTHROID, LEVOTHROID) 75 MCG tablet Take 75 mcg by mouth daily.        Marland Kitchen loteprednol (ALREX) 0.2 % SUSP 1 drop 4 (four) times a week.      . Multiple Vitamin (MULTIVITAMIN) capsule Take 1 capsule by mouth daily.        . pantoprazole (PROTONIX) 40 MG tablet TAKE 1 TABLET 30-60 MINUTES BEFORE YOUR FIRST AND LAST MEALS OF THE DAY  60 tablet  5  . Polyethyl Glycol-Propyl Glycol (SYSTANE OP) As directed       . pravastatin (PRAVACHOL) 80 MG tablet Take 1 tablet by mouth daily.      Marland Kitchen senna (SENOKOT) 8.6 MG tablet 3 at bedtime       . tizanidine (ZANAFLEX) 2 MG capsule Take 2 mg by mouth 2 (two) times daily. As directed      . triamterene-hydrochlorothiazide (MAXZIDE) 75-50 MG per tablet Take 1 tablet by mouth daily.      . vitamin B-12 (CYANOCOBALAMIN) 1000 MCG tablet Take 1,000 mcg by mouth as needed.         No Known Allergies   History  Smoking status  . Never Smoker   Smokeless tobacco  . Never Used    History  Alcohol Use  . Yes    Occ glass of wine with dinner    Past Medical History  Diagnosis  Date  . Hypertension   . Hyperlipidemia   . GERD (gastroesophageal reflux disease)   . Osteoarthritis   . Sarcoidosis   . Goiter   . Sicca   . Anxiety   . IBS (irritable bowel syndrome)   . Hemorrhoids   . DJD (degenerative joint disease)   . Hx of colonic polyps   . History of colonoscopy   . Asthma   . Barrett's esophagus   . History of shingles     face/eye    Current Outpatient Prescriptions  Medication Sig Dispense Refill  . Acetaminophen (TYLENOL ARTHRITIS EXT RELIEF PO) 2 tablets every 12 hours      . ALPRAZolam (XANAX) 0.5 MG tablet 2 tablets every am and 3 tablets at bedtime      . amLODipine (NORVASC) 5 MG tablet Take 1 tablet (5 mg total) by mouth daily.  30 tablet  5  . cetirizine (ZYRTEC) 10 MG tablet Take 10 mg by mouth daily.        . Cholecalciferol (VITAMIN D3) 1000 UNITS CAPS Take 1 capsule by mouth daily.      . cycloSPORINE  (RESTASIS) 0.05 % ophthalmic emulsion As directed       . ketotifen (ZADITOR) 0.025 % ophthalmic solution Place 1 drop into both eyes as needed.       Marland Kitchen levothyroxine (SYNTHROID, LEVOTHROID) 75 MCG tablet Take 75 mcg by mouth daily.        Marland Kitchen loteprednol (ALREX) 0.2 % SUSP 1 drop 4 (four) times a week.      . Multiple Vitamin (MULTIVITAMIN) capsule Take 1 capsule by mouth daily.        . pantoprazole (PROTONIX) 40 MG tablet TAKE 1 TABLET 30-60 MINUTES BEFORE YOUR FIRST AND LAST MEALS OF THE DAY  60 tablet  5  . Polyethyl Glycol-Propyl Glycol (SYSTANE OP) As directed       . pravastatin (PRAVACHOL) 80 MG tablet Take 1 tablet by mouth daily.      Marland Kitchen senna (SENOKOT) 8.6 MG tablet 3 at bedtime       . tizanidine (ZANAFLEX) 2 MG capsule Take 2 mg by mouth 2 (two) times daily. As directed      . triamterene-hydrochlorothiazide (MAXZIDE) 75-50 MG per tablet Take 1 tablet by mouth daily.      . vitamin B-12 (CYANOCOBALAMIN) 1000 MCG tablet Take 1,000 mcg by mouth as needed.          PHYSICAL EXAM: Filed Vitals:   09/30/11 0904  BP: 142/70  Pulse: 82  152 pounds  General:  Well appearing. No resp difficulty HEENT: normal Neck: supple. JVP of 6. Carotids 2+ bilaterally; no bruits. No lymphadenopathy or thryomegaly appreciated. Cor: PMI normal. Regular rate & rhythm. 3/6 SEM RSB. S2 crisp Lungs: clear Abdomen: soft, nontender, nondistended. No hepatosplenomegaly. No bruits or masses. Good bowel sounds. Extremities: no cyanosis, clubbing, rash, edema, DIP with herberden's nodes  Neuro: alert & orientedx3, cranial nerves grossly intact. Moves all 4 extremities w/o difficulty. Affect pleasant.  ASSESMENT/Plan:

## 2011-09-30 NOTE — Patient Instructions (Addendum)
Schedule ECHO at  [facility] May 2013   Follow up in 6 months

## 2011-10-03 NOTE — Assessment & Plan Note (Signed)
Unchanged. Seems to be doing fine.  Doubt this is primarily cardiac though AS and diastolic dysfunction can play a role. If getting worse can consider CPX testing. F/u echo schedule to assess AS and PA pressures.

## 2011-10-07 DIAGNOSIS — J309 Allergic rhinitis, unspecified: Secondary | ICD-10-CM | POA: Diagnosis not present

## 2011-10-07 DIAGNOSIS — M1990 Unspecified osteoarthritis, unspecified site: Secondary | ICD-10-CM | POA: Diagnosis not present

## 2011-10-07 DIAGNOSIS — M3500 Sicca syndrome, unspecified: Secondary | ICD-10-CM | POA: Diagnosis not present

## 2011-10-07 DIAGNOSIS — R5381 Other malaise: Secondary | ICD-10-CM | POA: Diagnosis not present

## 2011-10-07 DIAGNOSIS — D869 Sarcoidosis, unspecified: Secondary | ICD-10-CM | POA: Diagnosis not present

## 2011-10-21 DIAGNOSIS — I10 Essential (primary) hypertension: Secondary | ICD-10-CM | POA: Diagnosis not present

## 2011-10-21 DIAGNOSIS — H04129 Dry eye syndrome of unspecified lacrimal gland: Secondary | ICD-10-CM | POA: Diagnosis not present

## 2011-10-21 DIAGNOSIS — J309 Allergic rhinitis, unspecified: Secondary | ICD-10-CM | POA: Diagnosis not present

## 2011-11-09 DIAGNOSIS — R51 Headache: Secondary | ICD-10-CM | POA: Diagnosis not present

## 2011-11-09 DIAGNOSIS — M5412 Radiculopathy, cervical region: Secondary | ICD-10-CM | POA: Diagnosis not present

## 2011-11-09 DIAGNOSIS — M47812 Spondylosis without myelopathy or radiculopathy, cervical region: Secondary | ICD-10-CM | POA: Diagnosis not present

## 2011-11-09 DIAGNOSIS — M542 Cervicalgia: Secondary | ICD-10-CM | POA: Diagnosis not present

## 2011-11-23 ENCOUNTER — Other Ambulatory Visit (HOSPITAL_COMMUNITY): Payer: Self-pay | Admitting: Radiology

## 2011-11-23 DIAGNOSIS — I350 Nonrheumatic aortic (valve) stenosis: Secondary | ICD-10-CM

## 2011-11-24 ENCOUNTER — Other Ambulatory Visit: Payer: Self-pay

## 2011-11-24 ENCOUNTER — Ambulatory Visit (HOSPITAL_COMMUNITY): Payer: Medicare Other | Attending: Cardiology

## 2011-11-24 DIAGNOSIS — I359 Nonrheumatic aortic valve disorder, unspecified: Secondary | ICD-10-CM | POA: Insufficient documentation

## 2011-11-24 DIAGNOSIS — F411 Generalized anxiety disorder: Secondary | ICD-10-CM | POA: Insufficient documentation

## 2011-11-24 DIAGNOSIS — E785 Hyperlipidemia, unspecified: Secondary | ICD-10-CM | POA: Insufficient documentation

## 2011-11-24 DIAGNOSIS — R0609 Other forms of dyspnea: Secondary | ICD-10-CM | POA: Insufficient documentation

## 2011-11-24 DIAGNOSIS — D869 Sarcoidosis, unspecified: Secondary | ICD-10-CM | POA: Insufficient documentation

## 2011-11-24 DIAGNOSIS — R0989 Other specified symptoms and signs involving the circulatory and respiratory systems: Secondary | ICD-10-CM | POA: Diagnosis not present

## 2011-11-24 DIAGNOSIS — M1990 Unspecified osteoarthritis, unspecified site: Secondary | ICD-10-CM | POA: Diagnosis not present

## 2011-11-24 DIAGNOSIS — I350 Nonrheumatic aortic (valve) stenosis: Secondary | ICD-10-CM

## 2011-11-24 DIAGNOSIS — I10 Essential (primary) hypertension: Secondary | ICD-10-CM | POA: Diagnosis not present

## 2011-11-25 DIAGNOSIS — H01009 Unspecified blepharitis unspecified eye, unspecified eyelid: Secondary | ICD-10-CM | POA: Diagnosis not present

## 2011-11-25 DIAGNOSIS — H40019 Open angle with borderline findings, low risk, unspecified eye: Secondary | ICD-10-CM | POA: Diagnosis not present

## 2011-11-25 DIAGNOSIS — H1045 Other chronic allergic conjunctivitis: Secondary | ICD-10-CM | POA: Diagnosis not present

## 2011-11-25 DIAGNOSIS — H18599 Other hereditary corneal dystrophies, unspecified eye: Secondary | ICD-10-CM | POA: Diagnosis not present

## 2011-12-22 ENCOUNTER — Telehealth (HOSPITAL_COMMUNITY): Payer: Self-pay | Admitting: *Deleted

## 2011-12-22 NOTE — Telephone Encounter (Signed)
Brenda [name] called today. She had an echo back in March and would like to know the results of that echo.  Please give her a call. Thanks.

## 2011-12-28 NOTE — Telephone Encounter (Signed)
Left message for patient to call back  

## 2011-12-29 DIAGNOSIS — E890 Postprocedural hypothyroidism: Secondary | ICD-10-CM | POA: Diagnosis not present

## 2012-01-06 DIAGNOSIS — E890 Postprocedural hypothyroidism: Secondary | ICD-10-CM | POA: Diagnosis not present

## 2012-01-17 ENCOUNTER — Other Ambulatory Visit: Payer: Self-pay | Admitting: Internal Medicine

## 2012-01-20 ENCOUNTER — Other Ambulatory Visit: Payer: Self-pay | Admitting: Internal Medicine

## 2012-01-20 NOTE — Telephone Encounter (Signed)
Finally spoke w/pt and gave her echo results

## 2012-02-23 ENCOUNTER — Other Ambulatory Visit: Payer: Self-pay

## 2012-02-23 DIAGNOSIS — I10 Essential (primary) hypertension: Secondary | ICD-10-CM

## 2012-02-23 MED ORDER — AMLODIPINE BESYLATE 5 MG PO TABS: 5.0000 mg | ORAL_TABLET | Freq: Every day | ORAL | Status: DC

## 2012-02-23 NOTE — Telephone Encounter (Signed)
.   Requested Prescriptions   Signed Prescriptions Disp Refills  . amLODipine (NORVASC) 5 MG tablet 30 tablet 5    Sig: Take 1 tablet (5 mg total) by mouth daily.    Authorizing Provider: Dolores Patty    Ordering User: Lacie Scotts

## 2012-03-20 ENCOUNTER — Other Ambulatory Visit: Payer: Self-pay | Admitting: Internal Medicine

## 2012-03-21 ENCOUNTER — Other Ambulatory Visit: Payer: Self-pay | Admitting: Internal Medicine

## 2012-04-06 DIAGNOSIS — R5383 Other fatigue: Secondary | ICD-10-CM | POA: Diagnosis not present

## 2012-04-06 DIAGNOSIS — M1990 Unspecified osteoarthritis, unspecified site: Secondary | ICD-10-CM | POA: Diagnosis not present

## 2012-04-06 DIAGNOSIS — D869 Sarcoidosis, unspecified: Secondary | ICD-10-CM | POA: Diagnosis not present

## 2012-04-06 DIAGNOSIS — R5381 Other malaise: Secondary | ICD-10-CM | POA: Diagnosis not present

## 2012-04-06 DIAGNOSIS — M2550 Pain in unspecified joint: Secondary | ICD-10-CM | POA: Diagnosis not present

## 2012-04-06 DIAGNOSIS — M3500 Sicca syndrome, unspecified: Secondary | ICD-10-CM | POA: Diagnosis not present

## 2012-04-12 DIAGNOSIS — H40019 Open angle with borderline findings, low risk, unspecified eye: Secondary | ICD-10-CM | POA: Diagnosis not present

## 2012-04-12 DIAGNOSIS — H26499 Other secondary cataract, unspecified eye: Secondary | ICD-10-CM | POA: Diagnosis not present

## 2012-04-12 DIAGNOSIS — H18599 Other hereditary corneal dystrophies, unspecified eye: Secondary | ICD-10-CM | POA: Diagnosis not present

## 2012-04-12 DIAGNOSIS — H01009 Unspecified blepharitis unspecified eye, unspecified eyelid: Secondary | ICD-10-CM | POA: Diagnosis not present

## 2012-04-12 DIAGNOSIS — Z961 Presence of intraocular lens: Secondary | ICD-10-CM | POA: Diagnosis not present

## 2012-04-13 DIAGNOSIS — Z1231 Encounter for screening mammogram for malignant neoplasm of breast: Secondary | ICD-10-CM | POA: Diagnosis not present

## 2012-05-03 ENCOUNTER — Encounter: Payer: Self-pay | Admitting: Cardiology

## 2012-06-14 DIAGNOSIS — H1045 Other chronic allergic conjunctivitis: Secondary | ICD-10-CM | POA: Diagnosis not present

## 2012-06-14 DIAGNOSIS — H01009 Unspecified blepharitis unspecified eye, unspecified eyelid: Secondary | ICD-10-CM | POA: Diagnosis not present

## 2012-06-14 DIAGNOSIS — H04129 Dry eye syndrome of unspecified lacrimal gland: Secondary | ICD-10-CM | POA: Diagnosis not present

## 2012-06-14 DIAGNOSIS — H5710 Ocular pain, unspecified eye: Secondary | ICD-10-CM | POA: Diagnosis not present

## 2012-07-24 DIAGNOSIS — Z23 Encounter for immunization: Secondary | ICD-10-CM | POA: Diagnosis not present

## 2012-07-25 DIAGNOSIS — E890 Postprocedural hypothyroidism: Secondary | ICD-10-CM | POA: Diagnosis not present

## 2012-07-25 DIAGNOSIS — I10 Essential (primary) hypertension: Secondary | ICD-10-CM | POA: Diagnosis not present

## 2012-07-25 DIAGNOSIS — E7800 Pure hypercholesterolemia, unspecified: Secondary | ICD-10-CM | POA: Diagnosis not present

## 2012-07-25 DIAGNOSIS — Z23 Encounter for immunization: Secondary | ICD-10-CM | POA: Diagnosis not present

## 2012-07-28 DIAGNOSIS — T8062XA Other serum reaction due to vaccination, initial encounter: Secondary | ICD-10-CM | POA: Diagnosis not present

## 2012-08-03 DIAGNOSIS — H04129 Dry eye syndrome of unspecified lacrimal gland: Secondary | ICD-10-CM | POA: Diagnosis not present

## 2012-08-03 DIAGNOSIS — H1045 Other chronic allergic conjunctivitis: Secondary | ICD-10-CM | POA: Diagnosis not present

## 2012-08-03 DIAGNOSIS — H01009 Unspecified blepharitis unspecified eye, unspecified eyelid: Secondary | ICD-10-CM | POA: Diagnosis not present

## 2012-08-04 DIAGNOSIS — E890 Postprocedural hypothyroidism: Secondary | ICD-10-CM | POA: Diagnosis not present

## 2012-08-21 ENCOUNTER — Other Ambulatory Visit: Payer: Self-pay

## 2012-08-21 DIAGNOSIS — I10 Essential (primary) hypertension: Secondary | ICD-10-CM

## 2012-08-21 MED ORDER — AMLODIPINE BESYLATE 5 MG PO TABS: 5.0000 mg | ORAL_TABLET | Freq: Every day | ORAL | Status: DC

## 2012-09-27 ENCOUNTER — Encounter: Payer: Self-pay | Admitting: Cardiology

## 2012-10-02 DIAGNOSIS — H04129 Dry eye syndrome of unspecified lacrimal gland: Secondary | ICD-10-CM | POA: Diagnosis not present

## 2012-10-02 DIAGNOSIS — H40019 Open angle with borderline findings, low risk, unspecified eye: Secondary | ICD-10-CM | POA: Diagnosis not present

## 2012-10-02 DIAGNOSIS — H01009 Unspecified blepharitis unspecified eye, unspecified eyelid: Secondary | ICD-10-CM | POA: Diagnosis not present

## 2012-10-05 DIAGNOSIS — M3500 Sicca syndrome, unspecified: Secondary | ICD-10-CM | POA: Diagnosis not present

## 2012-10-05 DIAGNOSIS — D869 Sarcoidosis, unspecified: Secondary | ICD-10-CM | POA: Diagnosis not present

## 2012-10-05 DIAGNOSIS — M1990 Unspecified osteoarthritis, unspecified site: Secondary | ICD-10-CM | POA: Diagnosis not present

## 2012-10-05 DIAGNOSIS — M2550 Pain in unspecified joint: Secondary | ICD-10-CM | POA: Diagnosis not present

## 2012-10-10 ENCOUNTER — Other Ambulatory Visit (HOSPITAL_COMMUNITY): Payer: Self-pay | Admitting: *Deleted

## 2012-10-10 DIAGNOSIS — I10 Essential (primary) hypertension: Secondary | ICD-10-CM

## 2012-10-10 MED ORDER — AMLODIPINE BESYLATE 5 MG PO TABS: 5.0000 mg | ORAL_TABLET | Freq: Every day | ORAL | Status: DC

## 2012-10-11 ENCOUNTER — Other Ambulatory Visit: Payer: Self-pay | Admitting: Dermatology

## 2012-10-11 DIAGNOSIS — L821 Other seborrheic keratosis: Secondary | ICD-10-CM | POA: Diagnosis not present

## 2012-10-11 DIAGNOSIS — C44621 Squamous cell carcinoma of skin of unspecified upper limb, including shoulder: Secondary | ICD-10-CM | POA: Diagnosis not present

## 2012-10-11 DIAGNOSIS — D235 Other benign neoplasm of skin of trunk: Secondary | ICD-10-CM | POA: Diagnosis not present

## 2012-10-11 DIAGNOSIS — D0460 Carcinoma in situ of skin of unspecified upper limb, including shoulder: Secondary | ICD-10-CM | POA: Diagnosis not present

## 2012-10-26 DIAGNOSIS — L680 Hirsutism: Secondary | ICD-10-CM | POA: Diagnosis not present

## 2012-10-26 DIAGNOSIS — L650 Telogen effluvium: Secondary | ICD-10-CM | POA: Diagnosis not present

## 2012-10-26 DIAGNOSIS — D485 Neoplasm of uncertain behavior of skin: Secondary | ICD-10-CM | POA: Diagnosis not present

## 2012-10-27 DIAGNOSIS — F411 Generalized anxiety disorder: Secondary | ICD-10-CM | POA: Diagnosis not present

## 2012-12-08 DIAGNOSIS — M47812 Spondylosis without myelopathy or radiculopathy, cervical region: Secondary | ICD-10-CM | POA: Diagnosis not present

## 2012-12-08 DIAGNOSIS — R51 Headache: Secondary | ICD-10-CM | POA: Diagnosis not present

## 2012-12-08 DIAGNOSIS — M3500 Sicca syndrome, unspecified: Secondary | ICD-10-CM | POA: Insufficient documentation

## 2012-12-08 DIAGNOSIS — M542 Cervicalgia: Secondary | ICD-10-CM | POA: Diagnosis not present

## 2012-12-08 DIAGNOSIS — M5412 Radiculopathy, cervical region: Secondary | ICD-10-CM | POA: Diagnosis not present

## 2012-12-15 DIAGNOSIS — H16229 Keratoconjunctivitis sicca, not specified as Sjogren's, unspecified eye: Secondary | ICD-10-CM | POA: Diagnosis not present

## 2012-12-15 DIAGNOSIS — H04129 Dry eye syndrome of unspecified lacrimal gland: Secondary | ICD-10-CM | POA: Diagnosis not present

## 2012-12-15 DIAGNOSIS — H1045 Other chronic allergic conjunctivitis: Secondary | ICD-10-CM | POA: Diagnosis not present

## 2012-12-15 DIAGNOSIS — H40019 Open angle with borderline findings, low risk, unspecified eye: Secondary | ICD-10-CM | POA: Diagnosis not present

## 2013-03-15 ENCOUNTER — Encounter (HOSPITAL_COMMUNITY): Payer: Medicare Other

## 2013-04-17 DIAGNOSIS — Z1231 Encounter for screening mammogram for malignant neoplasm of breast: Secondary | ICD-10-CM | POA: Diagnosis not present

## 2013-04-19 ENCOUNTER — Ambulatory Visit (HOSPITAL_COMMUNITY)
Admission: RE | Admit: 2013-04-19 | Discharge: 2013-04-19 | Disposition: A | Discharge status: Home / Self Care | Payer: Medicare Other | Source: Ambulatory Visit | Attending: Internal Medicine | Admitting: Internal Medicine

## 2013-04-19 VITALS — BP 140/72 | HR 83 | Wt 146.2 lb

## 2013-04-19 DIAGNOSIS — D869 Sarcoidosis, unspecified: Secondary | ICD-10-CM | POA: Diagnosis not present

## 2013-04-19 DIAGNOSIS — I2510 Atherosclerotic heart disease of native coronary artery without angina pectoris: Secondary | ICD-10-CM | POA: Diagnosis not present

## 2013-04-19 DIAGNOSIS — I359 Nonrheumatic aortic valve disorder, unspecified: Secondary | ICD-10-CM

## 2013-04-19 DIAGNOSIS — R0602 Shortness of breath: Secondary | ICD-10-CM

## 2013-04-19 NOTE — Patient Instructions (Addendum)
Your physician has requested that you have an echocardiogram. Echocardiography is a painless test that uses sound waves to create images of your heart. It provides your doctor with information about the size and shape of your heart and how well your heart's chambers and valves are working. This procedure takes approximately one hour. There are no restrictions for this procedure.  IN July 2015  We will contact you in 1 year to schedule your next appointment.

## 2013-04-20 DIAGNOSIS — I2510 Atherosclerotic heart disease of native coronary artery without angina pectoris: Secondary | ICD-10-CM | POA: Insufficient documentation

## 2013-04-20 NOTE — Progress Notes (Signed)
Patient ID: Brenda [name], female   DOB: Apr 30, 1931, 79 y.o.   MRN: [id number] PCP: Dr. Clarene Duke  Brenda [name] is an 79 year old female with history of mild to moderate coronary atheroscleorosis by cath in 1998, diastolic dysfunction, HL, mild aortic stenosis, osteoarthritis and sarcoidosis.  Sarcoid was diagnosed by Dr. Seymour Bars in the early 90s with skin involvement; she was placed on steriods for a short amount of time and denies recurrence.    She has been evaluated by Dr. Sherene Sires with pulmonary for her dyspnea.  CT chest 10/28/10 showed mediastinal and bilateral hilar adenopathy compatible with the patient's given history of sarcoidosis. Lungs are clear.   PFT's essentially normal spirometry moderately decreased DLCO 56% predicted.     Echo 12/22/2010: nml LVEF 55-60%, Diastolic dysfunction, mild aortic stenosis with mean gradient 16 mmHg, PAP 35 mmHg.  Lexiscan myoview on 01/18/2011 showed no evidence suggestive of ischemia. Echo (5/13) showed EF 55-60% with LVH, mild AS with mean gradient 19 mmHg.      She is here for follow up. She seems to be doing better in terms of exertional dyspnea.  She does not notice shortness of breath with normal activities and can climb a flight of steps without a problem.  She is not keeping her grand-daughter at this point and wants to go back to playing tennis.  No chest pain, no orthopnea/PND.  Unfortunately, her husband has been diagnosed with ALS.    SH: Married (husband with ALS), 4 children, nonsmoker.   FH: No premature CAD   ROS: All systems negative except as listed in HPI, PMH and Problem List.  Current Outpatient Prescriptions  Medication Sig Dispense Refill  . Acetaminophen (TYLENOL ARTHRITIS EXT RELIEF PO) 2 tablets every 12 hours      . ALPRAZolam (XANAX) 0.5 MG tablet 1 tablets every am and 3 tablets at bedtime      . amLODipine (NORVASC) 5 MG tablet Take 1 tablet (5 mg total) by mouth daily.  30 tablet  6  . Biotin 5000 MCG TABS Take 1 tablet by  mouth 2 (two) times daily.      . Cholecalciferol (VITAMIN D3) 1000 UNITS CAPS Take 1 capsule by mouth daily.      . cycloSPORINE (RESTASIS) 0.05 % ophthalmic emulsion Place 1 drop into both eyes every 12 (twelve) hours. As directed      . diphenhydrAMINE (BENADRYL) 25 mg capsule Take 25 mg by mouth every 6 (six) hours as needed for itching.      Marland Kitchen glucosamine-chondroitin 500-400 MG tablet Take 1 tablet by mouth 3 (three) times daily.      Marland Kitchen levothyroxine (SYNTHROID, LEVOTHROID) 75 MCG tablet Take 75 mcg by mouth daily.        . Multiple Vitamin (MULTIVITAMIN) capsule Take 1 capsule by mouth daily.        . Omega-3 Fatty Acids (FISH OIL) 1200 MG CAPS Take 2 capsules by mouth daily.      . pantoprazole (PROTONIX) 40 MG tablet TAKE 1 TABLET 30-60 MINUTES BEFORE YOUR FIRST AND LAST MEALS OF THE DAY  60 tablet  5  . pravastatin (PRAVACHOL) 80 MG tablet Take 1 tablet by mouth daily.      Marland Kitchen senna (SENOKOT) 8.6 MG tablet 3 at bedtime       . tizanidine (ZANAFLEX) 2 MG capsule Take 2 mg by mouth 2 (two) times daily. As directed      . triamterene-hydrochlorothiazide (MAXZIDE) 75-50 MG per tablet Take 1  tablet by mouth daily.      . [DISCONTINUED] calcium-vitamin D (OSCAL WITH D) 500-200 MG-UNIT per tablet Take 1 tablet by mouth daily.         No current facility-administered medications for this encounter.    No Known Allergies   History  Smoking status  . Never Smoker   Smokeless tobacco  . Never Used    History  Alcohol Use  . Yes    Comment: Occ glass of wine with dinner    Past Medical History  Diagnosis Date  . Hypertension   . Hyperlipidemia   . GERD (gastroesophageal reflux disease)   . Osteoarthritis   . Sarcoidosis   . Goiter   . Sicca   . Anxiety   . IBS (irritable bowel syndrome)   . Hemorrhoids   . DJD (degenerative joint disease)   . Hx of colonic polyps   . History of colonoscopy   . Asthma   . Barrett's esophagus   . History of shingles     face/eye     Current Outpatient Prescriptions  Medication Sig Dispense Refill  . Acetaminophen (TYLENOL ARTHRITIS EXT RELIEF PO) 2 tablets every 12 hours      . ALPRAZolam (XANAX) 0.5 MG tablet 1 tablets every am and 3 tablets at bedtime      . amLODipine (NORVASC) 5 MG tablet Take 1 tablet (5 mg total) by mouth daily.  30 tablet  6  . Biotin 5000 MCG TABS Take 1 tablet by mouth 2 (two) times daily.      . Cholecalciferol (VITAMIN D3) 1000 UNITS CAPS Take 1 capsule by mouth daily.      . cycloSPORINE (RESTASIS) 0.05 % ophthalmic emulsion Place 1 drop into both eyes every 12 (twelve) hours. As directed      . diphenhydrAMINE (BENADRYL) 25 mg capsule Take 25 mg by mouth every 6 (six) hours as needed for itching.      Marland Kitchen glucosamine-chondroitin 500-400 MG tablet Take 1 tablet by mouth 3 (three) times daily.      Marland Kitchen levothyroxine (SYNTHROID, LEVOTHROID) 75 MCG tablet Take 75 mcg by mouth daily.        . Multiple Vitamin (MULTIVITAMIN) capsule Take 1 capsule by mouth daily.        . Omega-3 Fatty Acids (FISH OIL) 1200 MG CAPS Take 2 capsules by mouth daily.      . pantoprazole (PROTONIX) 40 MG tablet TAKE 1 TABLET 30-60 MINUTES BEFORE YOUR FIRST AND LAST MEALS OF THE DAY  60 tablet  5  . pravastatin (PRAVACHOL) 80 MG tablet Take 1 tablet by mouth daily.      Marland Kitchen senna (SENOKOT) 8.6 MG tablet 3 at bedtime       . tizanidine (ZANAFLEX) 2 MG capsule Take 2 mg by mouth 2 (two) times daily. As directed      . triamterene-hydrochlorothiazide (MAXZIDE) 75-50 MG per tablet Take 1 tablet by mouth daily.      . [DISCONTINUED] calcium-vitamin D (OSCAL WITH D) 500-200 MG-UNIT per tablet Take 1 tablet by mouth daily.         No current facility-administered medications for this encounter.     PHYSICAL EXAM: Filed Vitals:   04/19/13 1052  BP: 140/72  Pulse: 83  146 lbs General:  Well appearing. No resp difficulty HEENT: normal Neck: supple. JVP not elevated. Carotids 2+ bilaterally; no bruits. No lymphadenopathy  or thryomegaly appreciated. Cor: PMI normal. Regular rate & rhythm. 2/6 early SEM with clear  S2.  Lungs: clear Abdomen: soft, nontender, nondistended. No hepatosplenomegaly. No bruits or masses. Good bowel sounds. Extremities: no cyanosis, clubbing, rash, edema, DIP with herberden's nodes  Neuro: alert & orientedx3, cranial nerves grossly intact. Moves all 4 extremities w/o difficulty. Affect pleasant.  ASSESMENT/PLAN: 1. Aortic stenosis: Mild on 5/13 echo and suspect it remains mild now.  Murmur is stable.  No significant dyspnea.  Will have her repeat an echo in 2015.  2. CAD: Prior mild-moderate nonobstructive disease.  She should take ASA 81 mg daily and continue statin.  She will be getting lipids with PCP soon and will send [place] a copy.  3. Dyspnea with exertion: This seems to have resolved for the most part.  She really has no significant functional limitations at this point besides her arthritis.  I think it would be fine for her to ease back into playing tennis.   Marca Ancona 04/20/2013

## 2013-05-11 ENCOUNTER — Other Ambulatory Visit: Payer: Self-pay

## 2013-05-11 DIAGNOSIS — I10 Essential (primary) hypertension: Secondary | ICD-10-CM

## 2013-05-11 MED ORDER — AMLODIPINE BESYLATE 5 MG PO TABS: 5.0000 mg | ORAL_TABLET | Freq: Every day | ORAL | Status: DC

## 2013-05-15 ENCOUNTER — Other Ambulatory Visit: Payer: Self-pay | Admitting: Dermatology

## 2013-05-15 DIAGNOSIS — L851 Acquired keratosis [keratoderma] palmaris et plantaris: Secondary | ICD-10-CM | POA: Diagnosis not present

## 2013-05-15 DIAGNOSIS — L821 Other seborrheic keratosis: Secondary | ICD-10-CM | POA: Diagnosis not present

## 2013-05-15 DIAGNOSIS — L570 Actinic keratosis: Secondary | ICD-10-CM | POA: Diagnosis not present

## 2013-05-15 DIAGNOSIS — D2370 Other benign neoplasm of skin of unspecified lower limb, including hip: Secondary | ICD-10-CM | POA: Diagnosis not present

## 2013-05-15 DIAGNOSIS — Z85828 Personal history of other malignant neoplasm of skin: Secondary | ICD-10-CM | POA: Diagnosis not present

## 2013-05-15 DIAGNOSIS — L659 Nonscarring hair loss, unspecified: Secondary | ICD-10-CM | POA: Diagnosis not present

## 2013-05-15 DIAGNOSIS — D485 Neoplasm of uncertain behavior of skin: Secondary | ICD-10-CM | POA: Diagnosis not present

## 2013-06-07 DIAGNOSIS — M67919 Unspecified disorder of synovium and tendon, unspecified shoulder: Secondary | ICD-10-CM | POA: Diagnosis not present

## 2013-06-08 DIAGNOSIS — H01009 Unspecified blepharitis unspecified eye, unspecified eyelid: Secondary | ICD-10-CM | POA: Diagnosis not present

## 2013-06-08 DIAGNOSIS — H18599 Other hereditary corneal dystrophies, unspecified eye: Secondary | ICD-10-CM | POA: Diagnosis not present

## 2013-06-08 DIAGNOSIS — H1045 Other chronic allergic conjunctivitis: Secondary | ICD-10-CM | POA: Diagnosis not present

## 2013-06-08 DIAGNOSIS — H26499 Other secondary cataract, unspecified eye: Secondary | ICD-10-CM | POA: Diagnosis not present

## 2013-06-08 DIAGNOSIS — H16229 Keratoconjunctivitis sicca, not specified as Sjogren's, unspecified eye: Secondary | ICD-10-CM | POA: Diagnosis not present

## 2013-06-08 DIAGNOSIS — H40019 Open angle with borderline findings, low risk, unspecified eye: Secondary | ICD-10-CM | POA: Diagnosis not present

## 2013-06-08 DIAGNOSIS — H524 Presbyopia: Secondary | ICD-10-CM | POA: Diagnosis not present

## 2013-06-08 DIAGNOSIS — H35039 Hypertensive retinopathy, unspecified eye: Secondary | ICD-10-CM | POA: Diagnosis not present

## 2013-06-28 ENCOUNTER — Other Ambulatory Visit: Payer: Self-pay | Admitting: Family Medicine

## 2013-06-28 DIAGNOSIS — M1990 Unspecified osteoarthritis, unspecified site: Secondary | ICD-10-CM | POA: Diagnosis not present

## 2013-06-28 DIAGNOSIS — I10 Essential (primary) hypertension: Secondary | ICD-10-CM | POA: Diagnosis not present

## 2013-06-28 DIAGNOSIS — E890 Postprocedural hypothyroidism: Secondary | ICD-10-CM | POA: Diagnosis not present

## 2013-06-28 DIAGNOSIS — R0989 Other specified symptoms and signs involving the circulatory and respiratory systems: Secondary | ICD-10-CM

## 2013-06-28 DIAGNOSIS — D869 Sarcoidosis, unspecified: Secondary | ICD-10-CM | POA: Diagnosis not present

## 2013-06-28 DIAGNOSIS — L659 Nonscarring hair loss, unspecified: Secondary | ICD-10-CM | POA: Diagnosis not present

## 2013-07-04 DIAGNOSIS — R0602 Shortness of breath: Secondary | ICD-10-CM | POA: Diagnosis not present

## 2013-07-04 DIAGNOSIS — E039 Hypothyroidism, unspecified: Secondary | ICD-10-CM | POA: Diagnosis not present

## 2013-07-05 ENCOUNTER — Ambulatory Visit
Admission: RE | Admit: 2013-07-05 | Discharge: 2013-07-05 | Disposition: A | Discharge status: Home / Self Care | Payer: Medicare Other | Source: Ambulatory Visit | Attending: Family Medicine | Admitting: Family Medicine

## 2013-07-05 DIAGNOSIS — R0989 Other specified symptoms and signs involving the circulatory and respiratory systems: Secondary | ICD-10-CM

## 2013-07-05 DIAGNOSIS — I658 Occlusion and stenosis of other precerebral arteries: Secondary | ICD-10-CM | POA: Diagnosis not present

## 2013-07-09 DIAGNOSIS — E039 Hypothyroidism, unspecified: Secondary | ICD-10-CM | POA: Diagnosis not present

## 2013-07-09 DIAGNOSIS — R0602 Shortness of breath: Secondary | ICD-10-CM | POA: Diagnosis not present

## 2013-07-09 DIAGNOSIS — D509 Iron deficiency anemia, unspecified: Secondary | ICD-10-CM | POA: Diagnosis not present

## 2013-07-09 DIAGNOSIS — R7989 Other specified abnormal findings of blood chemistry: Secondary | ICD-10-CM | POA: Diagnosis not present

## 2013-07-09 DIAGNOSIS — E559 Vitamin D deficiency, unspecified: Secondary | ICD-10-CM | POA: Diagnosis not present

## 2013-07-09 DIAGNOSIS — E782 Mixed hyperlipidemia: Secondary | ICD-10-CM | POA: Diagnosis not present

## 2013-07-09 DIAGNOSIS — K599 Functional intestinal disorder, unspecified: Secondary | ICD-10-CM | POA: Diagnosis not present

## 2013-07-10 ENCOUNTER — Inpatient Hospital Stay (HOSPITAL_COMMUNITY)
Admission: EM | Admit: 2013-07-10 | Discharge: 2013-07-17 | DRG: 964 | Disposition: A | Discharge status: Home / Self Care | Payer: No Typology Code available for payment source | Attending: General Surgery | Admitting: General Surgery

## 2013-07-10 ENCOUNTER — Encounter (HOSPITAL_COMMUNITY): Payer: Self-pay | Admitting: Emergency Medicine

## 2013-07-10 ENCOUNTER — Emergency Department (HOSPITAL_COMMUNITY): Payer: No Typology Code available for payment source

## 2013-07-10 DIAGNOSIS — Z8249 Family history of ischemic heart disease and other diseases of the circulatory system: Secondary | ICD-10-CM | POA: Diagnosis not present

## 2013-07-10 DIAGNOSIS — D869 Sarcoidosis, unspecified: Secondary | ICD-10-CM | POA: Diagnosis present

## 2013-07-10 DIAGNOSIS — M25569 Pain in unspecified knee: Secondary | ICD-10-CM | POA: Diagnosis present

## 2013-07-10 DIAGNOSIS — K219 Gastro-esophageal reflux disease without esophagitis: Secondary | ICD-10-CM | POA: Diagnosis present

## 2013-07-10 DIAGNOSIS — M545 Low back pain, unspecified: Secondary | ICD-10-CM | POA: Diagnosis not present

## 2013-07-10 DIAGNOSIS — S298XXA Other specified injuries of thorax, initial encounter: Secondary | ICD-10-CM | POA: Diagnosis not present

## 2013-07-10 DIAGNOSIS — F411 Generalized anxiety disorder: Secondary | ICD-10-CM | POA: Diagnosis present

## 2013-07-10 DIAGNOSIS — R339 Retention of urine, unspecified: Secondary | ICD-10-CM | POA: Diagnosis present

## 2013-07-10 DIAGNOSIS — M1990 Unspecified osteoarthritis, unspecified site: Secondary | ICD-10-CM | POA: Diagnosis present

## 2013-07-10 DIAGNOSIS — S32401A Unspecified fracture of right acetabulum, initial encounter for closed fracture: Secondary | ICD-10-CM

## 2013-07-10 DIAGNOSIS — M25561 Pain in right knee: Secondary | ICD-10-CM | POA: Diagnosis present

## 2013-07-10 DIAGNOSIS — I10 Essential (primary) hypertension: Secondary | ICD-10-CM | POA: Diagnosis not present

## 2013-07-10 DIAGNOSIS — M1710 Unilateral primary osteoarthritis, unspecified knee: Secondary | ICD-10-CM | POA: Diagnosis not present

## 2013-07-10 DIAGNOSIS — S065X9A Traumatic subdural hemorrhage with loss of consciousness of unspecified duration, initial encounter: Secondary | ICD-10-CM | POA: Diagnosis not present

## 2013-07-10 DIAGNOSIS — K5900 Constipation, unspecified: Secondary | ICD-10-CM | POA: Diagnosis present

## 2013-07-10 DIAGNOSIS — I2510 Atherosclerotic heart disease of native coronary artery without angina pectoris: Secondary | ICD-10-CM | POA: Diagnosis present

## 2013-07-10 DIAGNOSIS — S065XAA Traumatic subdural hemorrhage with loss of consciousness status unknown, initial encounter: Secondary | ICD-10-CM | POA: Diagnosis not present

## 2013-07-10 DIAGNOSIS — S0993XA Unspecified injury of face, initial encounter: Secondary | ICD-10-CM | POA: Diagnosis not present

## 2013-07-10 DIAGNOSIS — E785 Hyperlipidemia, unspecified: Secondary | ICD-10-CM | POA: Diagnosis present

## 2013-07-10 DIAGNOSIS — E876 Hypokalemia: Secondary | ICD-10-CM | POA: Diagnosis not present

## 2013-07-10 DIAGNOSIS — E039 Hypothyroidism, unspecified: Secondary | ICD-10-CM | POA: Diagnosis present

## 2013-07-10 DIAGNOSIS — D62 Acute posthemorrhagic anemia: Secondary | ICD-10-CM | POA: Diagnosis not present

## 2013-07-10 DIAGNOSIS — S32409A Unspecified fracture of unspecified acetabulum, initial encounter for closed fracture: Secondary | ICD-10-CM | POA: Diagnosis present

## 2013-07-10 DIAGNOSIS — I6200 Nontraumatic subdural hemorrhage, unspecified: Secondary | ICD-10-CM | POA: Diagnosis not present

## 2013-07-10 DIAGNOSIS — S3981XA Other specified injuries of abdomen, initial encounter: Secondary | ICD-10-CM | POA: Diagnosis not present

## 2013-07-10 DIAGNOSIS — T148XXA Other injury of unspecified body region, initial encounter: Secondary | ICD-10-CM | POA: Diagnosis not present

## 2013-07-10 DIAGNOSIS — J45909 Unspecified asthma, uncomplicated: Secondary | ICD-10-CM | POA: Diagnosis present

## 2013-07-10 DIAGNOSIS — Z825 Family history of asthma and other chronic lower respiratory diseases: Secondary | ICD-10-CM

## 2013-07-10 DIAGNOSIS — I629 Nontraumatic intracranial hemorrhage, unspecified: Secondary | ICD-10-CM | POA: Diagnosis present

## 2013-07-10 DIAGNOSIS — R338 Other retention of urine: Secondary | ICD-10-CM | POA: Diagnosis not present

## 2013-07-10 DIAGNOSIS — R51 Headache: Secondary | ICD-10-CM | POA: Diagnosis not present

## 2013-07-10 DIAGNOSIS — IMO0002 Reserved for concepts with insufficient information to code with codable children: Secondary | ICD-10-CM | POA: Diagnosis not present

## 2013-07-10 LAB — COMPREHENSIVE METABOLIC PANEL
ALT: 34 U/L (ref 0–35)
Alkaline Phosphatase: 75 U/L (ref 39–117)
BUN: 17 mg/dL (ref 6–23)
CO2: 27 mEq/L (ref 19–32)
Calcium: 8.2 mg/dL — ABNORMAL LOW (ref 8.4–10.5)
GFR calc Af Amer: 71 mL/min — ABNORMAL LOW (ref >90)
GFR calc non Af Amer: 61 mL/min — ABNORMAL LOW (ref >90)
Glucose, Bld: 91 mg/dL (ref 70–99)
Sodium: 139 mEq/L (ref 135–145)
Total Protein: 7.2 g/dL (ref 6.0–8.3)

## 2013-07-10 LAB — CBC
HCT: 39.5 % (ref 36.0–46.0)
Hemoglobin: 13.7 g/dL (ref 12.0–15.0)
MCH: 31.3 pg (ref 26.0–34.0)
MCHC: 34.7 g/dL (ref 30.0–36.0)
RBC: 4.38 MIL/uL (ref 3.87–5.11)
RDW: 13.2 % (ref 11.5–15.5)
WBC: 6.2 10*3/uL (ref 4.0–10.5)

## 2013-07-10 LAB — TROPONIN I: Troponin I: <0.3 ng/mL (ref <0.30)

## 2013-07-10 LAB — APTT: aPTT: 31 seconds (ref 24–37)

## 2013-07-10 MED ORDER — HYDROMORPHONE HCL PF 2 MG/ML IJ SOLN: 2.0000 mg | INTRAMUSCULAR | Status: DC | PRN

## 2013-07-10 MED ORDER — SODIUM CHLORIDE 0.9 % IV BOLUS (SEPSIS)
500.0000 mL | INTRAVENOUS | Status: AC
  Administered 2013-07-10: 500 mL via INTRAVENOUS

## 2013-07-10 MED ORDER — PANTOPRAZOLE SODIUM 40 MG PO TBEC
40.0000 mg | DELAYED_RELEASE_TABLET | Freq: Every day | ORAL | Status: DC
  Administered 2013-07-11 – 2013-07-17 (×7): 40 mg via ORAL
  Filled 2013-07-10 (×6): qty 1

## 2013-07-10 MED ORDER — ONDANSETRON HCL 4 MG PO TABS: 4.0000 mg | ORAL_TABLET | Freq: Four times a day (QID) | ORAL | Status: DC | PRN

## 2013-07-10 MED ORDER — MORPHINE SULFATE 4 MG/ML IJ SOLN
4.0000 mg | Freq: Once | INTRAMUSCULAR | Status: AC
  Administered 2013-07-10: 4 mg via INTRAVENOUS
  Filled 2013-07-10: qty 1

## 2013-07-10 MED ORDER — LEVETIRACETAM 500 MG PO TABS
500.0000 mg | ORAL_TABLET | Freq: Two times a day (BID) | ORAL | Status: AC
  Administered 2013-07-11 – 2013-07-17 (×14): 500 mg via ORAL
  Filled 2013-07-10 (×14): qty 1

## 2013-07-10 MED ORDER — LORAZEPAM 2 MG/ML IJ SOLN
0.2500 mg | Freq: Once | INTRAMUSCULAR | Status: AC
  Administered 2013-07-10: 0.25 mg via INTRAVENOUS
  Filled 2013-07-10: qty 1

## 2013-07-10 MED ORDER — IOHEXOL 300 MG/ML  SOLN
100.0000 mL | Freq: Once | INTRAMUSCULAR | Status: AC | PRN
  Administered 2013-07-10: 100 mL via INTRAVENOUS

## 2013-07-10 MED ORDER — WHITE PETROLATUM GEL
Status: AC
  Administered 2013-07-10: 0.2
  Filled 2013-07-10: qty 5

## 2013-07-10 MED ORDER — SODIUM CHLORIDE 0.9 % IV SOLN
INTRAVENOUS | Status: DC
  Administered 2013-07-10: via INTRAVENOUS

## 2013-07-10 MED ORDER — CYCLOSPORINE 0.05 % OP EMUL
1.0000 [drp] | Freq: Two times a day (BID) | OPHTHALMIC | Status: DC
  Administered 2013-07-11 (×2): 1 [drp] via OPHTHALMIC
  Administered 2013-07-11: 10:00:00 via OPHTHALMIC
  Administered 2013-07-12 – 2013-07-17 (×11): 1 [drp] via OPHTHALMIC
  Filled 2013-07-10 (×19): qty 1

## 2013-07-10 MED ORDER — HYDROMORPHONE HCL PF 1 MG/ML IJ SOLN
1.0000 mg | INTRAMUSCULAR | Status: DC | PRN
  Administered 2013-07-10 – 2013-07-11 (×5): 1 mg via INTRAVENOUS
  Filled 2013-07-10 (×5): qty 1

## 2013-07-10 MED ORDER — PANTOPRAZOLE SODIUM 40 MG IV SOLR
40.0000 mg | Freq: Every day | INTRAVENOUS | Status: DC
  Administered 2013-07-11: 40 mg via INTRAVENOUS
  Filled 2013-07-10 (×3): qty 40

## 2013-07-10 MED ORDER — FENTANYL CITRATE 0.05 MG/ML IJ SOLN
50.0000 ug | Freq: Once | INTRAMUSCULAR | Status: AC
  Administered 2013-07-10: 50 ug via INTRAVENOUS
  Filled 2013-07-10: qty 2

## 2013-07-10 MED ORDER — ONDANSETRON HCL 4 MG/2ML IJ SOLN
4.0000 mg | Freq: Four times a day (QID) | INTRAMUSCULAR | Status: DC | PRN
  Administered 2013-07-10: 4 mg via INTRAVENOUS
  Filled 2013-07-10: qty 2

## 2013-07-10 MED ORDER — ACETAMINOPHEN 325 MG PO TABS
650.0000 mg | ORAL_TABLET | ORAL | Status: DC | PRN
  Administered 2013-07-13 – 2013-07-17 (×4): 650 mg via ORAL
  Filled 2013-07-10 (×4): qty 2

## 2013-07-10 MED ORDER — AMLODIPINE BESYLATE 5 MG PO TABS
5.0000 mg | ORAL_TABLET | Freq: Every day | ORAL | Status: DC
  Administered 2013-07-13 – 2013-07-17 (×5): 5 mg via ORAL
  Filled 2013-07-10 (×8): qty 1
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
## 2013-07-10 NOTE — ED Notes (Signed)
Ice pak applied to pt. s head.  Warm blankets given.
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
## 2013-07-10 NOTE — ED Notes (Signed)
Neuro surgery at bedside.
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
## 2013-07-11 ENCOUNTER — Inpatient Hospital Stay (HOSPITAL_COMMUNITY): Payer: No Typology Code available for payment source

## 2013-07-11 LAB — CBC
HCT: 35.1 % — ABNORMAL LOW (ref 36.0–46.0)
Hemoglobin: 11.9 g/dL — ABNORMAL LOW (ref 12.0–15.0)
MCH: 31.2 pg (ref 26.0–34.0)
MCV: 92.1 fL (ref 78.0–100.0)
Platelets: 202 10*3/uL (ref 150–400)
WBC: 9.3 10*3/uL (ref 4.0–10.5)

## 2013-07-11 LAB — URINALYSIS, ROUTINE W REFLEX MICROSCOPIC
Glucose, UA: NEGATIVE mg/dL
Ketones, ur: NEGATIVE mg/dL
Nitrite: NEGATIVE
Protein, ur: NEGATIVE mg/dL
Specific Gravity, Urine: 1.026 (ref 1.005–1.030)
Urobilinogen, UA: 0.2 mg/dL (ref 0.0–1.0)

## 2013-07-11 LAB — BASIC METABOLIC PANEL
BUN: 13 mg/dL (ref 6–23)
CO2: 30 mEq/L (ref 19–32)
Chloride: 100 mEq/L (ref 96–112)
Creatinine, Ser: 0.78 mg/dL (ref 0.50–1.10)
Glucose, Bld: 116 mg/dL — ABNORMAL HIGH (ref 70–99)
Potassium: 3.3 mEq/L — ABNORMAL LOW (ref 3.5–5.1)

## 2013-07-11 LAB — URINE MICROSCOPIC-ADD ON

## 2013-07-11 MED ORDER — PANTOPRAZOLE SODIUM 40 MG PO TBEC: 40.0000 mg | DELAYED_RELEASE_TABLET | Freq: Every day | ORAL | Status: DC

## 2013-07-11 MED ORDER — LOTEPREDNOL ETABONATE 0.2 % OP SUSP: 1.0000 [drp] | Freq: Every day | OPHTHALMIC | Status: DC

## 2013-07-11 MED ORDER — SENNA 8.6 MG PO TABS
3.0000 | ORAL_TABLET | Freq: Every day | ORAL | Status: DC
  Administered 2013-07-11 – 2013-07-16 (×6): 25.8 mg via ORAL
  Filled 2013-07-11 (×9): qty 3

## 2013-07-11 MED ORDER — SIMVASTATIN 40 MG PO TABS: 40.0000 mg | ORAL_TABLET | Freq: Every day | ORAL | Status: DC

## 2013-07-11 MED ORDER — POTASSIUM CHLORIDE ER 10 MEQ PO CPCR: 10.0000 meq | ORAL_CAPSULE | Freq: Every day | ORAL | Status: DC

## 2013-07-11 MED ORDER — OXYCODONE-ACETAMINOPHEN 5-325 MG PO TABS
1.0000 | ORAL_TABLET | ORAL | Status: DC | PRN
  Administered 2013-07-11: 1 via ORAL
  Administered 2013-07-11: 2 via ORAL
  Administered 2013-07-11 (×2): 1 via ORAL
  Administered 2013-07-12 (×3): 2 via ORAL
  Filled 2013-07-11 (×2): qty 2
  Filled 2013-07-11: qty 1
  Filled 2013-07-11: qty 2
  Filled 2013-07-11: qty 1
  Filled 2013-07-11 (×2): qty 2

## 2013-07-11 MED ORDER — LEVOTHYROXINE SODIUM 75 MCG PO TABS
75.0000 ug | ORAL_TABLET | Freq: Every day | ORAL | Status: DC
  Administered 2013-07-12 – 2013-07-17 (×6): 75 ug via ORAL
  Filled 2013-07-11 (×8): qty 1

## 2013-07-11 MED ORDER — ALPRAZOLAM 0.5 MG PO TABS
1.5000 mg | ORAL_TABLET | Freq: Every day | ORAL | Status: DC
  Administered 2013-07-11 – 2013-07-16 (×6): 1.5 mg via ORAL
  Filled 2013-07-11 (×6): qty 3

## 2013-07-11 MED ORDER — ATORVASTATIN CALCIUM 20 MG PO TABS
20.0000 mg | ORAL_TABLET | Freq: Every day | ORAL | Status: DC
  Administered 2013-07-12 – 2013-07-16 (×5): 20 mg via ORAL
  Filled 2013-07-11 (×6): qty 1

## 2013-07-11 MED ORDER — SODIUM CHLORIDE 0.9 % IJ SOLN: 3.0000 mL | INTRAMUSCULAR | Status: DC | PRN

## 2013-07-11 MED ORDER — INFLUENZA VAC SPLIT QUAD 0.5 ML IM SUSP
0.5000 mL | INTRAMUSCULAR | Status: AC
  Administered 2013-07-12: 0.5 mL via INTRAMUSCULAR
  Filled 2013-07-11: qty 0.5

## 2013-07-11 MED ORDER — ALPRAZOLAM 0.5 MG PO TABS
0.5000 mg | ORAL_TABLET | ORAL | Status: DC
  Administered 2013-07-12 – 2013-07-17 (×10): 0.5 mg via ORAL
  Filled 2013-07-11 (×10): qty 1

## 2013-07-11 MED ORDER — DIPHENHYDRAMINE HCL 25 MG PO CAPS
25.0000 mg | ORAL_CAPSULE | Freq: Four times a day (QID) | ORAL | Status: DC | PRN
  Administered 2013-07-13 – 2013-07-16 (×2): 25 mg via ORAL
  Filled 2013-07-11 (×2): qty 1

## 2013-07-11 MED ORDER — SENNOSIDES 8.6 MG PO TABS: 3.0000 | ORAL_TABLET | Freq: Every day | ORAL | Status: DC

## 2013-07-11 MED ORDER — TRIAMTERENE-HCTZ 75-50 MG PO TABS
1.0000 | ORAL_TABLET | Freq: Every day | ORAL | Status: DC
  Administered 2013-07-11 – 2013-07-17 (×6): 1 via ORAL
  Filled 2013-07-11 (×7): qty 1

## 2013-07-11 MED ORDER — METHOCARBAMOL 500 MG PO TABS
750.0000 mg | ORAL_TABLET | Freq: Four times a day (QID) | ORAL | Status: DC | PRN
  Administered 2013-07-11 – 2013-07-15 (×2): 750 mg via ORAL
  Filled 2013-07-11: qty 2
  Filled 2013-07-11: qty 1
  Filled 2013-07-11: qty 2

## 2013-07-11 MED ORDER — POTASSIUM CHLORIDE CRYS ER 10 MEQ PO TBCR
10.0000 meq | EXTENDED_RELEASE_TABLET | Freq: Every day | ORAL | Status: DC
  Administered 2013-07-11: 10 meq via ORAL
  Filled 2013-07-11 (×2): qty 1

## 2013-07-11 MED ORDER — LOTEPREDNOL ETABONATE 0.5 % OP SUSP
1.0000 [drp] | Freq: Every day | OPHTHALMIC | Status: DC
  Administered 2013-07-11 – 2013-07-17 (×7): 1 [drp] via OPHTHALMIC
  Filled 2013-07-11: qty 5

## 2013-07-11 NOTE — Progress Notes (Signed)
Chaplain was referred to pt by overnight chaplain who had seen pt both in ED and in [place]. Pt was awake and alert and quite pleasant. Pt's daughter and granddaughter were present. Pt's daughter was attempting to get pt to eat something from her breakfast tray. Visited briefly with the three of them and offered a few encouraging words to pt before I was paged to the ED.
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
## 2013-07-12 DIAGNOSIS — E876 Hypokalemia: Secondary | ICD-10-CM | POA: Diagnosis present

## 2013-07-12 DIAGNOSIS — D62 Acute posthemorrhagic anemia: Secondary | ICD-10-CM | POA: Diagnosis not present

## 2013-07-12 DIAGNOSIS — S32401A Unspecified fracture of right acetabulum, initial encounter for closed fracture: Secondary | ICD-10-CM

## 2013-07-12 LAB — BASIC METABOLIC PANEL
CO2: 32 mEq/L (ref 19–32)
Calcium: 7.9 mg/dL — ABNORMAL LOW (ref 8.4–10.5)
Chloride: 96 mEq/L (ref 96–112)
Creatinine, Ser: 1.15 mg/dL — ABNORMAL HIGH (ref 0.50–1.10)
GFR calc non Af Amer: 44 mL/min — ABNORMAL LOW (ref >90)
Glucose, Bld: 99 mg/dL (ref 70–99)
Sodium: 136 mEq/L (ref 135–145)

## 2013-07-12 LAB — CBC
HCT: 36.5 % (ref 36.0–46.0)
MCH: 31.4 pg (ref 26.0–34.0)
MCHC: 33.7 g/dL (ref 30.0–36.0)
MCV: 93.1 fL (ref 78.0–100.0)
Platelets: 225 10*3/uL (ref 150–400)
RBC: 3.92 MIL/uL (ref 3.87–5.11)

## 2013-07-12 MED ORDER — SODIUM CHLORIDE 0.9 % IV SOLN
INTRAVENOUS | Status: DC
  Administered 2013-07-12: 19:00:00 via INTRAVENOUS

## 2013-07-12 MED ORDER — TIZANIDINE HCL 2 MG PO CAPS: 2.0000 mg | ORAL_CAPSULE | Freq: Two times a day (BID) | ORAL | Status: DC

## 2013-07-12 MED ORDER — HYDROMORPHONE HCL PF 1 MG/ML IJ SOLN
0.5000 mg | INTRAMUSCULAR | Status: DC | PRN
  Administered 2013-07-14 – 2013-07-16 (×4): 0.5 mg via INTRAVENOUS
  Filled 2013-07-12 (×4): qty 1

## 2013-07-12 MED ORDER — POTASSIUM CHLORIDE CRYS ER 20 MEQ PO TBCR
20.0000 meq | EXTENDED_RELEASE_TABLET | Freq: Two times a day (BID) | ORAL | Status: DC
  Administered 2013-07-12 – 2013-07-17 (×11): 20 meq via ORAL
  Filled 2013-07-12 (×15): qty 1

## 2013-07-12 MED ORDER — TRAMADOL HCL 50 MG PO TABS
100.0000 mg | ORAL_TABLET | Freq: Four times a day (QID) | ORAL | Status: DC
  Administered 2013-07-12 – 2013-07-13 (×2): 100 mg via ORAL
  Filled 2013-07-12 (×2): qty 2

## 2013-07-12 MED ORDER — TIZANIDINE HCL 2 MG PO TABS
2.0000 mg | ORAL_TABLET | Freq: Two times a day (BID) | ORAL | Status: DC
  Administered 2013-07-12 – 2013-07-17 (×10): 2 mg via ORAL
  Filled 2013-07-12 (×13): qty 1

## 2013-07-12 MED ORDER — DEXTROSE-NACL 5-0.9 % IV SOLN
INTRAVENOUS | Status: DC
  Administered 2013-07-12 – 2013-07-16 (×6): via INTRAVENOUS

## 2013-07-12 NOTE — Progress Notes (Signed)
My order for PT two days ago was cancelled and the patient is not being evaluated by PT until today.  Await their evaluation.  This patient has been seen and I agree with the findings and treatment plan.  Marta Lamas. Gae Bon, MD, FACS [phone] (pager) [phone] (direct pager) Trauma Surgeon

## 2013-07-12 NOTE — Progress Notes (Signed)
Unable to wean pt. From O2; sats drop to 80s off O2. Also, pt. C/o of "room spinning" after getting back in bed. SBP dropped to 90s. Charma Igo, PA notified. Orders received. Will monitor.
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
## 2013-07-12 NOTE — Clinical Social Work Note (Signed)
Clinical Social Worker continuing to follow patient and family for support and discharge planning needs.  CSW attempted to speak with patient at bedside, however per RN patient is confused, likely due to medication.  CSW left a message for patient husband at 17:03 regarding the possible need for SNF placement.  CSW will await return call or visit at bedside to initiate SNF search.  CSW remains available for support and to facilitate patient discharge needs once medically ready.  Macario Golds, [place] [phone]
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
## 2013-07-12 NOTE — Progress Notes (Signed)
Pt. With no void since foley removed @ 1030am. Bladder Scan showing 80cc. Pt. Remains very sleepy; c/o dizziness after getting back to bed after orthostatics. Orthostatic VS: lying: 95/46 P- 66, sitting: 107/72 P-73, standing: 124/77 P-113. Pt. Also noted with some intermittent confusion and forgetfulness, mostly after taking Percocet. Pt. With poor po intake.  Paged Trauma MD pager x3 starting @ 1715, called answering service @ 1807. Received call back from Dr. Luisa Hart @ [phone]; orders received. Will monitor.
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
## 2013-07-13 ENCOUNTER — Inpatient Hospital Stay (HOSPITAL_COMMUNITY): Payer: No Typology Code available for payment source

## 2013-07-13 DIAGNOSIS — M25561 Pain in right knee: Secondary | ICD-10-CM | POA: Diagnosis present

## 2013-07-13 DIAGNOSIS — R338 Other retention of urine: Secondary | ICD-10-CM | POA: Diagnosis not present

## 2013-07-13 DIAGNOSIS — R339 Retention of urine, unspecified: Secondary | ICD-10-CM

## 2013-07-13 DIAGNOSIS — E876 Hypokalemia: Secondary | ICD-10-CM

## 2013-07-13 LAB — BASIC METABOLIC PANEL
BUN: 13 mg/dL (ref 6–23)
CO2: 30 mEq/L (ref 19–32)
Calcium: 7.5 mg/dL — ABNORMAL LOW (ref 8.4–10.5)
Creatinine, Ser: 0.83 mg/dL (ref 0.50–1.10)
GFR calc non Af Amer: 65 mL/min — ABNORMAL LOW (ref >90)
Glucose, Bld: 118 mg/dL — ABNORMAL HIGH (ref 70–99)
Sodium: 136 mEq/L (ref 135–145)

## 2013-07-13 MED ORDER — DOCUSATE SODIUM 100 MG PO CAPS
200.0000 mg | ORAL_CAPSULE | Freq: Two times a day (BID) | ORAL | Status: DC
  Administered 2013-07-13 – 2013-07-17 (×9): 200 mg via ORAL
  Filled 2013-07-13 (×8): qty 2

## 2013-07-13 MED ORDER — TRAMADOL HCL 50 MG PO TABS
50.0000 mg | ORAL_TABLET | Freq: Four times a day (QID) | ORAL | Status: DC | PRN
  Administered 2013-07-13 – 2013-07-14 (×4): 50 mg via ORAL
  Administered 2013-07-15: 100 mg via ORAL
  Administered 2013-07-15 – 2013-07-16 (×3): 50 mg via ORAL
  Administered 2013-07-16 – 2013-07-17 (×2): 100 mg via ORAL
  Filled 2013-07-13 (×4): qty 1
  Filled 2013-07-13: qty 2
  Filled 2013-07-13: qty 1
  Filled 2013-07-13: qty 2
  Filled 2013-07-13: qty 1
  Filled 2013-07-13: qty 2
  Filled 2013-07-13 (×2): qty 1

## 2013-07-13 MED ORDER — BETHANECHOL CHLORIDE 25 MG PO TABS
25.0000 mg | ORAL_TABLET | Freq: Four times a day (QID) | ORAL | Status: DC
  Administered 2013-07-13 – 2013-07-17 (×17): 25 mg via ORAL
  Filled 2013-07-13 (×20): qty 1

## 2013-07-13 MED ORDER — POLYETHYLENE GLYCOL 3350 17 G PO PACK
17.0000 g | PACK | Freq: Every day | ORAL | Status: DC
  Administered 2013-07-13 – 2013-07-17 (×4): 17 g via ORAL
  Filled 2013-07-13 (×5): qty 1

## 2013-07-13 NOTE — Progress Notes (Signed)
PT Cancellation Note  Patient Details Name: Margurette Brener MRN: [id number] DOB: 09/04/1930   Cancelled Treatment:    Reason Eval/Treat Not Completed: Patient at procedure or test/unavailable.  Pt at x-ray.  Will try another time.     Nicky Milhouse, Alison Murray 07/13/2013, 10:11 AM
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
## 2013-07-13 NOTE — Progress Notes (Signed)
OT Cancellation Note  Patient Details Name: Tayjah Lobdell MRN: [id number] DOB: 11/23/1930   Cancelled Treatment:    Reason Eval/Treat Not Completed: Patient at procedure or test/ unavailable. Pt currently off the floor and Ot to reattempt at later date / time  Harolyn Rutherford Pager: [phone]  07/13/2013, 11:36 AM

## 2013-07-13 NOTE — Clinical Social Work Placement (Addendum)
Clinical Social Work Department CLINICAL SOCIAL WORK PLACEMENT NOTE 07/13/2013  Patient:  Brenda [name], Brenda [name]  Account Number:  [id number] Admit date:  07/10/2013  Clinical Social Worker:  Macario Golds, LCSW  Date/time:  07/13/2013 01:45 PM  Clinical Social Work is seeking post-discharge placement for this patient at the following level of care:   SKILLED NURSING   (*CSW will update this form in Epic as items are completed)   07/13/2013  Patient/family provided with [facility] Department of Clinical Social Work's list of facilities offering this level of care within the geographic area requested by the patient (or if unable, by the patient's family).  07/13/2013  Patient/family informed of their freedom to choose among providers that offer the needed level of care, that participate in Medicare, Medicaid or managed care program needed by the patient, have an available bed and are willing to accept the patient.  07/13/2013  Patient/family informed of [facility]' ownership interest in [facility], as well as of the fact that they are under no obligation to receive care at this facility.  PASARR submitted to EDS on  PASARR number received from EDS on   FL2 transmitted to all facilities in geographic area requested by pt/family on  07/13/2013 FL2 transmitted to all facilities within larger geographic area on   Patient informed that his/her managed care company has contracts with or will negotiate with  certain facilities, including the following:     Patient/family informed of bed offers received:  07/14/2013 Patient chooses bed at  [facility] Physician recommends and patient chooses bed at    Patient to be transferred to Home on   Patient to be transferred to facility by   The following physician request were entered in Epic:   Additional Comments: 12/19 Patient will need 30 day Pasarr prior to discharge - 30 day note needs to be signed
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
## 2013-07-13 NOTE — Clinical Social Work Note (Signed)
Clinical Social Worker continuing to follow patient and family for support and discharge planning needs.  CSW spoke with patient and grandchildren at bedside regarding possible need for SNF placement.  Patient is in agreement with SNF search in [facility].  CSW completed FL2 and initiated search in [place].  CSW to follow up with patient and family regarding potential bed offers once available.  CSW to facilitate patient discharge needs once medically ready.  Macario Golds, [place] [phone]

## 2013-07-13 NOTE — Progress Notes (Addendum)
Patient states that she is too sore to move.  Some confusion.  Complaining primarily of right hip and right knee pain.  Will review all X-rays.  This patient has been seen and I agree with the findings and treatment plan.  Marta Lamas. Gae Bon, MD, FACS [phone] (pager) [phone] (direct pager) Trauma Surgeon  Patient has a lot of bruising around her right knee.  No X-rays have been done.  Will check today.

## 2013-07-14 NOTE — Progress Notes (Signed)
Patient refuses to turn  In bed and compains of  Right sided and knee pain  Yells when being turned for care. Prn medication administered states it had helped with the pain "as long as you dont move me"
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
## 2013-07-14 NOTE — Progress Notes (Signed)
Weekend CSW provided patient and husband at bedside with current SNF bed offers. Patient and husband agreeable to SNF for continued PT services at discharge. CSW encouraged patient and husband to request CSW assistance if they had any future questions. Both voiced their appreciation for CSW assistance today.  Samuella Bruin, MSW, LCSWA Clinical Social Worker [facility]. [phone]
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
## 2013-07-16 MED ORDER — MAGNESIUM CITRATE PO SOLN
1.0000 | Freq: Once | ORAL | Status: AC
  Administered 2013-07-16: 1 via ORAL
  Filled 2013-07-16: qty 296

## 2013-07-16 MED ORDER — IBUPROFEN 400 MG PO TABS
400.0000 mg | ORAL_TABLET | Freq: Three times a day (TID) | ORAL | Status: DC | PRN
  Administered 2013-07-17: 400 mg via ORAL
  Filled 2013-07-16: qty 1

## 2013-07-16 MED ORDER — IBUPROFEN 400 MG PO TABS
400.0000 mg | ORAL_TABLET | Freq: Once | ORAL | Status: AC
  Administered 2013-07-16: 400 mg via ORAL
  Filled 2013-07-16: qty 1
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
## 2013-07-16 NOTE — Clinical Social Work Note (Signed)
Clinical Social Worker continuing to follow patient and family for support and discharge planning needs.  CSW met with patient, patient husband, patient daughters, and patient grandchildren at bedside to continue to discuss patient placement at discharge.  Patient family has decided that they would prefer to take patient home with max assistance and equipment.  CSW has updated CM and PA who will make arrangements for home health and all equipment needs.  CSW recommended home health social worker in the event that placement may be needed from home - family in agreement if patient is eligible.  Patient family has list of facility offers available.  Patient may need transport home via ambulance pending patient family comfort and patient need for oxygen at discharge.    Clinical Social Worker will sign off for now as social work intervention is no longer needed. Please consult [place] again if new need arises or transportation is needed at discharge.  Macario Golds, [place] [phone]

## 2013-07-16 NOTE — Care Management Note (Signed)
  Page 1 of 1   07/16/2013     2:04:17 PM   CARE MANAGEMENT NOTE 07/16/2013  Patient:  Brenda [name], Brenda [name]   Account Number:  [id number]  Date Initiated:  07/16/2013  Documentation initiated by:  Ronny Flurry  Subjective/Objective Assessment:     Action/Plan:   Anticipated DC Date:  07/17/2013   Anticipated DC Plan:  HOME W HOME HEALTH SERVICES         Choice offered to / List presented to:  C-1 Patient           Status of service:   Medicare Important Message given?   (If response is "NO", the following Medicare IM given date fields will be blank) Date Medicare IM given:   Date Additional Medicare IM given:    Discharge Disposition:    Per UR Regulation:    If discussed at Long Length of Stay Meetings, dates discussed:    Comments:  07-16-13 Confirmed facesheet information with patient and multiple family members ( including husband) in room. Husband's cell phone number [phone] . Patient would like to go home at [employer] , family can provide support.  If possible family would like DME delivered tonight ie hospital bed for discharge tomorrow . DME ordered , [facility] called.  [facility] would like [facility] for PT/ OT / SW  Ronny Flurry RN BSN [phone]

## 2013-07-17 MED ORDER — POLYETHYLENE GLYCOL 3350 17 G PO PACK: 17.0000 g | PACK | Freq: Every day | ORAL | Status: DC

## 2013-07-17 MED ORDER — TRAMADOL HCL 50 MG PO TABS: 50.0000 mg | ORAL_TABLET | Freq: Four times a day (QID) | ORAL | Status: DC | PRN

## 2013-07-17 NOTE — Progress Notes (Signed)
D/C today Lavanya Roa, MD, MPH, FACS Pager: [phone]  

## 2013-07-17 NOTE — Progress Notes (Signed)
Patient completely drank bottle of mag citrate. Still unable to have a bowel moment. Bowel is soft, non distended with report of passing flatus frequently. Encourage oral fluids and ambulation. Will continue to monitor.   Sim Boast, RN
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
## 2013-07-17 NOTE — Discharge Summary (Signed)
Charnay Nazario, MD, MPH, FACS Pager: [phone]  

## 2013-07-18 ENCOUNTER — Other Ambulatory Visit (INDEPENDENT_AMBULATORY_CARE_PROVIDER_SITE_OTHER): Payer: Self-pay | Admitting: General Surgery

## 2013-07-18 MED ORDER — HYDROCODONE-ACETAMINOPHEN 5-325 MG PO TABS: 1.0000 | ORAL_TABLET | ORAL | Status: DC | PRN

## 2013-07-21 DIAGNOSIS — Z5189 Encounter for other specified aftercare: Secondary | ICD-10-CM | POA: Diagnosis not present

## 2013-07-21 DIAGNOSIS — IMO0002 Reserved for concepts with insufficient information to code with codable children: Secondary | ICD-10-CM | POA: Diagnosis not present

## 2013-07-21 DIAGNOSIS — IMO0001 Reserved for inherently not codable concepts without codable children: Secondary | ICD-10-CM | POA: Diagnosis not present

## 2013-07-21 DIAGNOSIS — M25559 Pain in unspecified hip: Secondary | ICD-10-CM | POA: Diagnosis not present

## 2013-07-24 DIAGNOSIS — IMO0002 Reserved for concepts with insufficient information to code with codable children: Secondary | ICD-10-CM | POA: Diagnosis not present

## 2013-07-24 DIAGNOSIS — Z5189 Encounter for other specified aftercare: Secondary | ICD-10-CM | POA: Diagnosis not present

## 2013-07-24 DIAGNOSIS — IMO0001 Reserved for inherently not codable concepts without codable children: Secondary | ICD-10-CM | POA: Diagnosis not present

## 2013-07-24 DIAGNOSIS — M25559 Pain in unspecified hip: Secondary | ICD-10-CM | POA: Diagnosis not present

## 2013-07-25 ENCOUNTER — Telehealth (HOSPITAL_COMMUNITY): Payer: Self-pay | Admitting: Emergency Medicine

## 2013-07-25 DIAGNOSIS — Z5189 Encounter for other specified aftercare: Secondary | ICD-10-CM | POA: Diagnosis not present

## 2013-07-25 DIAGNOSIS — M25559 Pain in unspecified hip: Secondary | ICD-10-CM | POA: Diagnosis not present

## 2013-07-25 DIAGNOSIS — IMO0001 Reserved for inherently not codable concepts without codable children: Secondary | ICD-10-CM | POA: Diagnosis not present

## 2013-07-25 DIAGNOSIS — IMO0002 Reserved for concepts with insufficient information to code with codable children: Secondary | ICD-10-CM | POA: Diagnosis not present

## 2013-07-25 NOTE — Telephone Encounter (Signed)
Phone was busy

## 2013-07-26 DIAGNOSIS — IMO0001 Reserved for inherently not codable concepts without codable children: Secondary | ICD-10-CM | POA: Diagnosis not present

## 2013-07-26 DIAGNOSIS — M25559 Pain in unspecified hip: Secondary | ICD-10-CM | POA: Diagnosis not present

## 2013-07-26 DIAGNOSIS — Z5189 Encounter for other specified aftercare: Secondary | ICD-10-CM | POA: Diagnosis not present

## 2013-07-26 DIAGNOSIS — IMO0002 Reserved for concepts with insufficient information to code with codable children: Secondary | ICD-10-CM | POA: Diagnosis not present

## 2013-07-27 ENCOUNTER — Telehealth (HOSPITAL_COMMUNITY): Payer: Self-pay | Admitting: Emergency Medicine

## 2013-07-27 NOTE — Telephone Encounter (Signed)
I spoke with patient's husband who said she took the tramadol when she got out of the hospital and it made her sick so they got a rx for Norco the next day from on-call MD. They are now out of that and the orthopedic surgeon has said he won't refill until he sees her in clinic on 1/5. I suggested they give tramadol 100mg  another try today and if it makes her sick to call back and we'd refill it but she was taking that without problem while she was an inpatient so it may have been coincidental.

## 2013-07-30 DIAGNOSIS — M25559 Pain in unspecified hip: Secondary | ICD-10-CM | POA: Diagnosis not present

## 2013-07-30 DIAGNOSIS — M545 Low back pain, unspecified: Secondary | ICD-10-CM | POA: Diagnosis not present

## 2013-07-31 DIAGNOSIS — IMO0002 Reserved for concepts with insufficient information to code with codable children: Secondary | ICD-10-CM | POA: Diagnosis not present

## 2013-07-31 DIAGNOSIS — IMO0001 Reserved for inherently not codable concepts without codable children: Secondary | ICD-10-CM | POA: Diagnosis not present

## 2013-07-31 DIAGNOSIS — Z5189 Encounter for other specified aftercare: Secondary | ICD-10-CM | POA: Diagnosis not present

## 2013-07-31 DIAGNOSIS — M25559 Pain in unspecified hip: Secondary | ICD-10-CM | POA: Diagnosis not present

## 2013-08-02 DIAGNOSIS — Z5189 Encounter for other specified aftercare: Secondary | ICD-10-CM | POA: Diagnosis not present

## 2013-08-02 DIAGNOSIS — IMO0001 Reserved for inherently not codable concepts without codable children: Secondary | ICD-10-CM | POA: Diagnosis not present

## 2013-08-02 DIAGNOSIS — IMO0002 Reserved for concepts with insufficient information to code with codable children: Secondary | ICD-10-CM | POA: Diagnosis not present

## 2013-08-02 DIAGNOSIS — M25559 Pain in unspecified hip: Secondary | ICD-10-CM | POA: Diagnosis not present

## 2013-08-03 DIAGNOSIS — Z5189 Encounter for other specified aftercare: Secondary | ICD-10-CM | POA: Diagnosis not present

## 2013-08-03 DIAGNOSIS — IMO0002 Reserved for concepts with insufficient information to code with codable children: Secondary | ICD-10-CM | POA: Diagnosis not present

## 2013-08-03 DIAGNOSIS — M25559 Pain in unspecified hip: Secondary | ICD-10-CM | POA: Diagnosis not present

## 2013-08-03 DIAGNOSIS — IMO0001 Reserved for inherently not codable concepts without codable children: Secondary | ICD-10-CM | POA: Diagnosis not present

## 2013-08-06 DIAGNOSIS — I6200 Nontraumatic subdural hemorrhage, unspecified: Secondary | ICD-10-CM | POA: Diagnosis not present

## 2013-08-06 DIAGNOSIS — I10 Essential (primary) hypertension: Secondary | ICD-10-CM | POA: Diagnosis not present

## 2013-08-07 DIAGNOSIS — IMO0001 Reserved for inherently not codable concepts without codable children: Secondary | ICD-10-CM | POA: Diagnosis not present

## 2013-08-07 DIAGNOSIS — M25559 Pain in unspecified hip: Secondary | ICD-10-CM | POA: Diagnosis not present

## 2013-08-07 DIAGNOSIS — IMO0002 Reserved for concepts with insufficient information to code with codable children: Secondary | ICD-10-CM | POA: Diagnosis not present

## 2013-08-07 DIAGNOSIS — Z5189 Encounter for other specified aftercare: Secondary | ICD-10-CM | POA: Diagnosis not present

## 2013-08-08 DIAGNOSIS — M25559 Pain in unspecified hip: Secondary | ICD-10-CM | POA: Diagnosis not present

## 2013-08-08 DIAGNOSIS — IMO0001 Reserved for inherently not codable concepts without codable children: Secondary | ICD-10-CM | POA: Diagnosis not present

## 2013-08-08 DIAGNOSIS — IMO0002 Reserved for concepts with insufficient information to code with codable children: Secondary | ICD-10-CM | POA: Diagnosis not present

## 2013-08-08 DIAGNOSIS — Z5189 Encounter for other specified aftercare: Secondary | ICD-10-CM | POA: Diagnosis not present

## 2013-08-09 DIAGNOSIS — E876 Hypokalemia: Secondary | ICD-10-CM | POA: Diagnosis not present

## 2013-08-09 DIAGNOSIS — D869 Sarcoidosis, unspecified: Secondary | ICD-10-CM | POA: Diagnosis not present

## 2013-08-09 DIAGNOSIS — M25559 Pain in unspecified hip: Secondary | ICD-10-CM | POA: Diagnosis not present

## 2013-08-09 DIAGNOSIS — L659 Nonscarring hair loss, unspecified: Secondary | ICD-10-CM | POA: Diagnosis not present

## 2013-08-09 DIAGNOSIS — E890 Postprocedural hypothyroidism: Secondary | ICD-10-CM | POA: Diagnosis not present

## 2013-08-09 DIAGNOSIS — R0989 Other specified symptoms and signs involving the circulatory and respiratory systems: Secondary | ICD-10-CM | POA: Diagnosis not present

## 2013-08-09 DIAGNOSIS — IMO0002 Reserved for concepts with insufficient information to code with codable children: Secondary | ICD-10-CM | POA: Diagnosis not present

## 2013-08-09 DIAGNOSIS — I10 Essential (primary) hypertension: Secondary | ICD-10-CM | POA: Diagnosis not present

## 2013-08-09 DIAGNOSIS — IMO0001 Reserved for inherently not codable concepts without codable children: Secondary | ICD-10-CM | POA: Diagnosis not present

## 2013-08-09 DIAGNOSIS — M1990 Unspecified osteoarthritis, unspecified site: Secondary | ICD-10-CM | POA: Diagnosis not present

## 2013-08-09 DIAGNOSIS — Z5189 Encounter for other specified aftercare: Secondary | ICD-10-CM | POA: Diagnosis not present

## 2013-08-10 DIAGNOSIS — IMO0002 Reserved for concepts with insufficient information to code with codable children: Secondary | ICD-10-CM | POA: Diagnosis not present

## 2013-08-10 DIAGNOSIS — M25559 Pain in unspecified hip: Secondary | ICD-10-CM | POA: Diagnosis not present

## 2013-08-10 DIAGNOSIS — Z5189 Encounter for other specified aftercare: Secondary | ICD-10-CM | POA: Diagnosis not present

## 2013-08-10 DIAGNOSIS — IMO0001 Reserved for inherently not codable concepts without codable children: Secondary | ICD-10-CM | POA: Diagnosis not present

## 2013-08-11 DIAGNOSIS — M25559 Pain in unspecified hip: Secondary | ICD-10-CM | POA: Diagnosis not present

## 2013-08-11 DIAGNOSIS — IMO0001 Reserved for inherently not codable concepts without codable children: Secondary | ICD-10-CM | POA: Diagnosis not present

## 2013-08-11 DIAGNOSIS — Z5189 Encounter for other specified aftercare: Secondary | ICD-10-CM | POA: Diagnosis not present

## 2013-08-11 DIAGNOSIS — IMO0002 Reserved for concepts with insufficient information to code with codable children: Secondary | ICD-10-CM | POA: Diagnosis not present

## 2013-08-14 DIAGNOSIS — IMO0002 Reserved for concepts with insufficient information to code with codable children: Secondary | ICD-10-CM | POA: Diagnosis not present

## 2013-08-14 DIAGNOSIS — Z5189 Encounter for other specified aftercare: Secondary | ICD-10-CM | POA: Diagnosis not present

## 2013-08-14 DIAGNOSIS — M25559 Pain in unspecified hip: Secondary | ICD-10-CM | POA: Diagnosis not present

## 2013-08-14 DIAGNOSIS — IMO0001 Reserved for inherently not codable concepts without codable children: Secondary | ICD-10-CM | POA: Diagnosis not present

## 2013-08-16 DIAGNOSIS — IMO0002 Reserved for concepts with insufficient information to code with codable children: Secondary | ICD-10-CM | POA: Diagnosis not present

## 2013-08-16 DIAGNOSIS — Z5189 Encounter for other specified aftercare: Secondary | ICD-10-CM | POA: Diagnosis not present

## 2013-08-16 DIAGNOSIS — IMO0001 Reserved for inherently not codable concepts without codable children: Secondary | ICD-10-CM | POA: Diagnosis not present

## 2013-08-16 DIAGNOSIS — M25559 Pain in unspecified hip: Secondary | ICD-10-CM | POA: Diagnosis not present

## 2013-08-22 DIAGNOSIS — IMO0002 Reserved for concepts with insufficient information to code with codable children: Secondary | ICD-10-CM | POA: Diagnosis not present

## 2013-08-22 DIAGNOSIS — Z5189 Encounter for other specified aftercare: Secondary | ICD-10-CM | POA: Diagnosis not present

## 2013-08-22 DIAGNOSIS — IMO0001 Reserved for inherently not codable concepts without codable children: Secondary | ICD-10-CM | POA: Diagnosis not present

## 2013-08-22 DIAGNOSIS — M25559 Pain in unspecified hip: Secondary | ICD-10-CM | POA: Diagnosis not present

## 2013-09-19 DIAGNOSIS — Z8781 Personal history of (healed) traumatic fracture: Secondary | ICD-10-CM | POA: Diagnosis not present

## 2013-09-19 DIAGNOSIS — Z9181 History of falling: Secondary | ICD-10-CM | POA: Diagnosis not present

## 2013-09-19 DIAGNOSIS — Z1331 Encounter for screening for depression: Secondary | ICD-10-CM | POA: Diagnosis not present

## 2013-09-19 DIAGNOSIS — E890 Postprocedural hypothyroidism: Secondary | ICD-10-CM | POA: Diagnosis not present

## 2013-09-19 DIAGNOSIS — Z13820 Encounter for screening for osteoporosis: Secondary | ICD-10-CM | POA: Diagnosis not present

## 2013-09-27 DIAGNOSIS — R509 Fever, unspecified: Secondary | ICD-10-CM | POA: Diagnosis not present

## 2013-09-27 DIAGNOSIS — R82998 Other abnormal findings in urine: Secondary | ICD-10-CM | POA: Diagnosis not present

## 2013-11-12 DIAGNOSIS — L659 Nonscarring hair loss, unspecified: Secondary | ICD-10-CM | POA: Diagnosis not present

## 2013-11-12 DIAGNOSIS — L570 Actinic keratosis: Secondary | ICD-10-CM | POA: Diagnosis not present

## 2013-11-12 DIAGNOSIS — D1801 Hemangioma of skin and subcutaneous tissue: Secondary | ICD-10-CM | POA: Diagnosis not present

## 2013-11-12 DIAGNOSIS — Z85828 Personal history of other malignant neoplasm of skin: Secondary | ICD-10-CM | POA: Diagnosis not present

## 2013-11-12 DIAGNOSIS — Z79899 Other long term (current) drug therapy: Secondary | ICD-10-CM | POA: Diagnosis not present

## 2013-11-12 DIAGNOSIS — L821 Other seborrheic keratosis: Secondary | ICD-10-CM | POA: Diagnosis not present

## 2013-12-18 ENCOUNTER — Encounter (HOSPITAL_COMMUNITY): Payer: Self-pay | Admitting: Emergency Medicine

## 2013-12-18 ENCOUNTER — Emergency Department (HOSPITAL_COMMUNITY): Payer: Medicare Other

## 2013-12-18 ENCOUNTER — Inpatient Hospital Stay (HOSPITAL_COMMUNITY)
Admission: EM | Admit: 2013-12-18 | Discharge: 2013-12-25 | DRG: 964 | Disposition: A | Discharge status: Home Health | Payer: Medicare Other | Attending: General Surgery | Admitting: General Surgery

## 2013-12-18 DIAGNOSIS — R339 Retention of urine, unspecified: Secondary | ICD-10-CM | POA: Diagnosis present

## 2013-12-18 DIAGNOSIS — Z807 Family history of other malignant neoplasms of lymphoid, hematopoietic and related tissues: Secondary | ICD-10-CM | POA: Diagnosis not present

## 2013-12-18 DIAGNOSIS — Z800 Family history of malignant neoplasm of digestive organs: Secondary | ICD-10-CM | POA: Diagnosis not present

## 2013-12-18 DIAGNOSIS — S2239XA Fracture of one rib, unspecified side, initial encounter for closed fracture: Secondary | ICD-10-CM | POA: Diagnosis present

## 2013-12-18 DIAGNOSIS — Z8051 Family history of malignant neoplasm of kidney: Secondary | ICD-10-CM | POA: Diagnosis not present

## 2013-12-18 DIAGNOSIS — IMO0002 Reserved for concepts with insufficient information to code with codable children: Secondary | ICD-10-CM | POA: Diagnosis present

## 2013-12-18 DIAGNOSIS — J9383 Other pneumothorax: Secondary | ICD-10-CM | POA: Diagnosis not present

## 2013-12-18 DIAGNOSIS — S3609XA Other injury of spleen, initial encounter: Secondary | ICD-10-CM | POA: Diagnosis present

## 2013-12-18 DIAGNOSIS — S36039A Unspecified laceration of spleen, initial encounter: Secondary | ICD-10-CM | POA: Diagnosis not present

## 2013-12-18 DIAGNOSIS — E785 Hyperlipidemia, unspecified: Secondary | ICD-10-CM | POA: Diagnosis present

## 2013-12-18 DIAGNOSIS — S139XXA Sprain of joints and ligaments of unspecified parts of neck, initial encounter: Secondary | ICD-10-CM | POA: Diagnosis present

## 2013-12-18 DIAGNOSIS — Z860100 Personal history of colon polyps, unspecified: Secondary | ICD-10-CM | POA: Diagnosis not present

## 2013-12-18 DIAGNOSIS — I10 Essential (primary) hypertension: Secondary | ICD-10-CM | POA: Diagnosis present

## 2013-12-18 DIAGNOSIS — S065X9A Traumatic subdural hemorrhage with loss of consciousness of unspecified duration, initial encounter: Secondary | ICD-10-CM

## 2013-12-18 DIAGNOSIS — K219 Gastro-esophageal reflux disease without esophagitis: Secondary | ICD-10-CM | POA: Diagnosis present

## 2013-12-18 DIAGNOSIS — M25559 Pain in unspecified hip: Secondary | ICD-10-CM | POA: Diagnosis not present

## 2013-12-18 DIAGNOSIS — Z808 Family history of malignant neoplasm of other organs or systems: Secondary | ICD-10-CM | POA: Diagnosis not present

## 2013-12-18 DIAGNOSIS — S199XXA Unspecified injury of neck, initial encounter: Secondary | ICD-10-CM | POA: Diagnosis not present

## 2013-12-18 DIAGNOSIS — S2242XA Multiple fractures of ribs, left side, initial encounter for closed fracture: Secondary | ICD-10-CM | POA: Diagnosis present

## 2013-12-18 DIAGNOSIS — S0993XA Unspecified injury of face, initial encounter: Secondary | ICD-10-CM | POA: Diagnosis not present

## 2013-12-18 DIAGNOSIS — K5900 Constipation, unspecified: Secondary | ICD-10-CM | POA: Diagnosis not present

## 2013-12-18 DIAGNOSIS — M545 Low back pain, unspecified: Secondary | ICD-10-CM | POA: Diagnosis not present

## 2013-12-18 DIAGNOSIS — S298XXA Other specified injuries of thorax, initial encounter: Secondary | ICD-10-CM | POA: Diagnosis not present

## 2013-12-18 DIAGNOSIS — Z8249 Family history of ischemic heart disease and other diseases of the circulatory system: Secondary | ICD-10-CM | POA: Diagnosis not present

## 2013-12-18 DIAGNOSIS — S2232XA Fracture of one rib, left side, initial encounter for closed fracture: Secondary | ICD-10-CM | POA: Diagnosis present

## 2013-12-18 DIAGNOSIS — M542 Cervicalgia: Secondary | ICD-10-CM | POA: Diagnosis not present

## 2013-12-18 DIAGNOSIS — T148XXA Other injury of unspecified body region, initial encounter: Secondary | ICD-10-CM | POA: Diagnosis not present

## 2013-12-18 DIAGNOSIS — S065XAA Traumatic subdural hemorrhage with loss of consciousness status unknown, initial encounter: Principal | ICD-10-CM | POA: Diagnosis present

## 2013-12-18 DIAGNOSIS — R079 Chest pain, unspecified: Secondary | ICD-10-CM | POA: Diagnosis not present

## 2013-12-18 DIAGNOSIS — S270XXA Traumatic pneumothorax, initial encounter: Secondary | ICD-10-CM | POA: Diagnosis present

## 2013-12-18 DIAGNOSIS — Z0389 Encounter for observation for other suspected diseases and conditions ruled out: Secondary | ICD-10-CM | POA: Diagnosis not present

## 2013-12-18 DIAGNOSIS — J939 Pneumothorax, unspecified: Secondary | ICD-10-CM

## 2013-12-18 DIAGNOSIS — Z825 Family history of asthma and other chronic lower respiratory diseases: Secondary | ICD-10-CM | POA: Diagnosis not present

## 2013-12-18 DIAGNOSIS — S2249XA Multiple fractures of ribs, unspecified side, initial encounter for closed fracture: Secondary | ICD-10-CM | POA: Diagnosis not present

## 2013-12-18 DIAGNOSIS — S3981XA Other specified injuries of abdomen, initial encounter: Secondary | ICD-10-CM | POA: Diagnosis not present

## 2013-12-18 LAB — CBC
HCT: 37.4 % (ref 36.0–46.0)
HCT: 38.4 % (ref 36.0–46.0)
HEMOGLOBIN: 13.3 g/dL (ref 12.0–15.0)
Hemoglobin: 12.8 g/dL (ref 12.0–15.0)
MCH: 30.5 pg (ref 26.0–34.0)
MCH: 30.7 pg (ref 26.0–34.0)
MCHC: 34.2 g/dL (ref 30.0–36.0)
MCHC: 34.6 g/dL (ref 30.0–36.0)
MCV: 89.3 fL (ref 78.0–100.0)
MCV: 88.7 fL (ref 78.0–100.0)
Platelets: 186 10*3/uL (ref 150–400)
Platelets: 184 10*3/uL (ref 150–400)
RBC: 4.19 MIL/uL (ref 3.87–5.11)
RBC: 4.33 MIL/uL (ref 3.87–5.11)
RDW: 12.9 % (ref 11.5–15.5)
RDW: 13 % (ref 11.5–15.5)
WBC: 6.7 10*3/uL (ref 4.0–10.5)
WBC: 14.3 10*3/uL — ABNORMAL HIGH (ref 4.0–10.5)

## 2013-12-18 LAB — URINALYSIS, ROUTINE W REFLEX MICROSCOPIC
Bilirubin Urine: NEGATIVE
Glucose, UA: NEGATIVE mg/dL
HGB URINE DIPSTICK: NEGATIVE
KETONES UR: NEGATIVE mg/dL
Leukocytes, UA: NEGATIVE
NITRITE: NEGATIVE
Protein, ur: NEGATIVE mg/dL
Specific Gravity, Urine: 1.022 (ref 1.005–1.030)
Urobilinogen, UA: 0.2 mg/dL (ref 0.0–1.0)
pH: 7.5 (ref 5.0–8.0)

## 2013-12-18 LAB — COMPREHENSIVE METABOLIC PANEL
ALK PHOS: 63 U/L (ref 39–117)
ALT: 24 U/L (ref 0–35)
AST: 37 U/L (ref 0–37)
Albumin: 3.9 g/dL (ref 3.5–5.2)
BUN: 18 mg/dL (ref 6–23)
CO2: 26 mEq/L (ref 19–32)
Calcium: 9.5 mg/dL (ref 8.4–10.5)
Chloride: 99 mEq/L (ref 96–112)
Creatinine, Ser: 1.04 mg/dL (ref 0.50–1.10)
GFR, EST AFRICAN AMERICAN: 57 mL/min — AB (ref >90)
GFR, EST NON AFRICAN AMERICAN: 49 mL/min — AB (ref >90)
Glucose, Bld: 103 mg/dL — ABNORMAL HIGH (ref 70–99)
POTASSIUM: 3.7 meq/L (ref 3.7–5.3)
SODIUM: 140 meq/L (ref 137–147)
TOTAL PROTEIN: 7.5 g/dL (ref 6.0–8.3)
Total Bilirubin: 0.5 mg/dL (ref 0.3–1.2)

## 2013-12-18 LAB — CDS SEROLOGY

## 2013-12-18 LAB — MRSA PCR SCREENING: MRSA BY PCR: NEGATIVE

## 2013-12-18 LAB — TYPE AND SCREEN
ABO/RH(D): O POS
Antibody Screen: NEGATIVE

## 2013-12-18 LAB — ETHANOL: Alcohol, Ethyl (B): <11 mg/dL (ref 0–11)

## 2013-12-18 LAB — ABO/RH: ABO/RH(D): O POS

## 2013-12-18 LAB — TROPONIN I: Troponin I: <0.3 ng/mL (ref <0.30)

## 2013-12-18 LAB — PROTIME-INR
INR: 0.94 (ref 0.00–1.49)
PROTHROMBIN TIME: 12.4 s (ref 11.6–15.2)

## 2013-12-18 MED ORDER — DIPHENHYDRAMINE HCL 25 MG PO CAPS: 25.0000 mg | ORAL_CAPSULE | Freq: Four times a day (QID) | ORAL | Status: DC | PRN

## 2013-12-18 MED ORDER — POTASSIUM CHLORIDE IN NACL 20-0.9 MEQ/L-% IV SOLN
INTRAVENOUS | Status: DC
  Administered 2013-12-18 – 2013-12-21 (×5): via INTRAVENOUS
  Filled 2013-12-18 (×7): qty 1000

## 2013-12-18 MED ORDER — ONDANSETRON HCL 4 MG/2ML IJ SOLN
4.0000 mg | Freq: Four times a day (QID) | INTRAMUSCULAR | Status: DC | PRN
  Administered 2013-12-21: 4 mg via INTRAVENOUS
  Filled 2013-12-18 (×2): qty 2

## 2013-12-18 MED ORDER — PANTOPRAZOLE SODIUM 40 MG IV SOLR
40.0000 mg | Freq: Every day | INTRAVENOUS | Status: DC
  Administered 2013-12-19: 40 mg via INTRAVENOUS
  Filled 2013-12-18 (×7): qty 40

## 2013-12-18 MED ORDER — IOHEXOL 300 MG/ML  SOLN: 100.0000 mL | Freq: Once | INTRAMUSCULAR | Status: AC | PRN

## 2013-12-18 MED ORDER — PANTOPRAZOLE SODIUM 40 MG PO TBEC
40.0000 mg | DELAYED_RELEASE_TABLET | Freq: Every day | ORAL | Status: DC
  Administered 2013-12-18 – 2013-12-25 (×7): 40 mg via ORAL
  Filled 2013-12-18 (×7): qty 1

## 2013-12-18 MED ORDER — IOHEXOL 300 MG/ML  SOLN
100.0000 mL | Freq: Once | INTRAMUSCULAR | Status: AC | PRN
  Administered 2013-12-18: 100 mL via INTRAVENOUS

## 2013-12-18 MED ORDER — LEVOTHYROXINE SODIUM 75 MCG PO TABS
75.0000 ug | ORAL_TABLET | Freq: Every day | ORAL | Status: DC
  Administered 2013-12-19 – 2013-12-25 (×7): 75 ug via ORAL
  Filled 2013-12-18 (×8): qty 1

## 2013-12-18 MED ORDER — ALPRAZOLAM 0.5 MG PO TABS: 0.5000 mg | ORAL_TABLET | Freq: Three times a day (TID) | ORAL | Status: DC

## 2013-12-18 MED ORDER — FENTANYL CITRATE 0.05 MG/ML IJ SOLN
50.0000 ug | Freq: Once | INTRAMUSCULAR | Status: AC
  Administered 2013-12-18: 50 ug via INTRAVENOUS
  Filled 2013-12-18: qty 2

## 2013-12-18 MED ORDER — AMLODIPINE BESYLATE 5 MG PO TABS
5.0000 mg | ORAL_TABLET | Freq: Every day | ORAL | Status: DC
  Administered 2013-12-19 – 2013-12-25 (×7): 5 mg via ORAL
  Filled 2013-12-18 (×7): qty 1

## 2013-12-18 MED ORDER — FENTANYL CITRATE 0.05 MG/ML IJ SOLN
INTRAMUSCULAR | Status: AC
  Filled 2013-12-18: qty 2

## 2013-12-18 MED ORDER — TRIAMTERENE-HCTZ 75-50 MG PO TABS
1.0000 | ORAL_TABLET | Freq: Every day | ORAL | Status: DC
  Administered 2013-12-19 – 2013-12-25 (×7): 1 via ORAL
  Filled 2013-12-18 (×8): qty 1

## 2013-12-18 MED ORDER — ONDANSETRON HCL 4 MG PO TABS: 4.0000 mg | ORAL_TABLET | Freq: Four times a day (QID) | ORAL | Status: DC | PRN

## 2013-12-18 MED ORDER — TETANUS-DIPHTH-ACELL PERTUSSIS 5-2.5-18.5 LF-MCG/0.5 IM SUSP
0.5000 mL | Freq: Once | INTRAMUSCULAR | Status: AC
  Administered 2013-12-18: 0.5 mL via INTRAMUSCULAR
  Filled 2013-12-18: qty 0.5

## 2013-12-18 MED ORDER — FENTANYL CITRATE 0.05 MG/ML IJ SOLN
25.0000 ug | INTRAMUSCULAR | Status: DC | PRN
  Administered 2013-12-18: 25 ug via INTRAVENOUS
  Administered 2013-12-18 – 2013-12-19 (×7): 50 ug via INTRAVENOUS
  Filled 2013-12-18 (×7): qty 2

## 2013-12-18 MED ORDER — ALPRAZOLAM 0.5 MG PO TABS
0.5000 mg | ORAL_TABLET | Freq: Two times a day (BID) | ORAL | Status: DC
  Administered 2013-12-18 – 2013-12-19 (×3): 0.5 mg via ORAL
  Filled 2013-12-18 (×3): qty 1

## 2013-12-18 MED ORDER — ALPRAZOLAM 0.5 MG PO TABS
1.5000 mg | ORAL_TABLET | Freq: Every day | ORAL | Status: DC
  Administered 2013-12-18 – 2013-12-19 (×2): 1.5 mg via ORAL
  Filled 2013-12-18 (×2): qty 3

## 2013-12-18 MED ORDER — BIOTENE DRY MOUTH MT LIQD
15.0000 mL | Freq: Two times a day (BID) | OROMUCOSAL | Status: DC
  Administered 2013-12-18 – 2013-12-22 (×8): 15 mL via OROMUCOSAL
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
## 2013-12-18 NOTE — ED Notes (Addendum)
Xray at the bedside, remains in c-collar.

## 2013-12-18 NOTE — Plan of Care (Signed)
Problem: Phase I Progression Outcomes Goal: Voiding-avoid urinary catheter unless indicated Outcome: Completed/Met Date Met:  12/18/13 Catheter placed for retention

## 2013-12-19 ENCOUNTER — Encounter (HOSPITAL_COMMUNITY): Payer: Self-pay | Admitting: Radiology

## 2013-12-19 ENCOUNTER — Inpatient Hospital Stay (HOSPITAL_COMMUNITY): Payer: Medicare Other

## 2013-12-19 LAB — BASIC METABOLIC PANEL
BUN: 14 mg/dL (ref 6–23)
CO2: 25 mEq/L (ref 19–32)
CREATININE: 0.83 mg/dL (ref 0.50–1.10)
Calcium: 9.1 mg/dL (ref 8.4–10.5)
Chloride: 99 mEq/L (ref 96–112)
GFR, EST AFRICAN AMERICAN: 75 mL/min — AB (ref >90)
GFR, EST NON AFRICAN AMERICAN: 65 mL/min — AB (ref >90)
GLUCOSE: 121 mg/dL — AB (ref 70–99)
Potassium: 4.1 mEq/L (ref 3.7–5.3)
Sodium: 140 mEq/L (ref 137–147)

## 2013-12-19 LAB — CBC
HEMATOCRIT: 36.8 % (ref 36.0–46.0)
HEMOGLOBIN: 12.4 g/dL (ref 12.0–15.0)
MCH: 30.2 pg (ref 26.0–34.0)
MCHC: 33.7 g/dL (ref 30.0–36.0)
MCV: 89.8 fL (ref 78.0–100.0)
Platelets: 187 10*3/uL (ref 150–400)
RBC: 4.1 MIL/uL (ref 3.87–5.11)
RDW: 13 % (ref 11.5–15.5)
WBC: 10.5 10*3/uL (ref 4.0–10.5)

## 2013-12-19 LAB — HEMOGLOBIN AND HEMATOCRIT, BLOOD
HEMATOCRIT: 37.5 % (ref 36.0–46.0)
HEMOGLOBIN: 12.5 g/dL (ref 12.0–15.0)

## 2013-12-19 MED ORDER — SODIUM CHLORIDE 0.9 % IV BOLUS (SEPSIS)
500.0000 mL | Freq: Once | INTRAVENOUS | Status: AC
  Administered 2013-12-19: 500 mL via INTRAVENOUS

## 2013-12-19 NOTE — Progress Notes (Signed)
Radiologist called to report worsening of SDH on f/u CT scan. She asked for neurosurgery's number in order to discuss. Number given, per radiology report, Dr. Hal Neer notified by radiology. Pt with no neurological changes at this time. Will monitor.
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
## 2013-12-19 NOTE — Progress Notes (Signed)
Patient ID: Brenda [name], female   DOB: Dec 06, 1930, 80 y.o.   MRN: [id number] Afeb, vss. Patient is awake, alert, conversant. Follows complex commands. No headache. Her CT today definitely looks worse with increased subdural blood, and some mild (2mm) shift. The basilar cisterns are open. I have had a long discussion with trauma MD and the patient. Inspite of the increases blood, and slight shift, she is neuroplogically intact, and craniotomy on an 80 year old is fraught with risk. We feel at this time that as long as she is doing well neurologically, we will watch things carefully and only consider sugery if she starts to worsen neurologically. All are in agreement in the plan.

## 2013-12-19 NOTE — Progress Notes (Signed)
UR completed.  Minh Jasper, RN BSN MHA CCM Trauma/Neuro ICU Case Manager [phone]  

## 2013-12-19 NOTE — Progress Notes (Signed)
Patient ID: Brenda [name], female   DOB: Oct 30, 1930, 80 y.o.   MRN: [id number] Awke,c/o headache. No weakness

## 2013-12-19 NOTE — Clinical Social Work Placement (Signed)
Clinical Social Work Department CLINICAL SOCIAL WORK PLACEMENT NOTE 12/19/2013  Patient:  [name], [name]  Account Number:  [id number] [place] date:  12/18/2013  Clinical Social Worker:  Barbette Or, LCSW  Date/time:  12/19/2013 03:30 PM  Clinical Social Work is seeking post-discharge placement for this patient at the following level of care:   SKILLED NURSING   (*CSW will update this form in Epic as items are completed)   12/19/2013  Patient/family provided with [place] Department of Clinical Social Work's list of facilities offering this level of care within the geographic area requested by the patient (or if unable, by the patient's family).  12/19/2013  Patient/family informed of their freedom to choose among providers that offer the needed level of care, that participate in Medicare, Medicaid or managed care program needed by the patient, have an available bed and are willing to accept the patient.  12/19/2013  Patient/family informed of [facility]' ownership interest in [facility], as well as of the fact that they are under no obligation to receive care at this facility.  PASARR submitted to EDS on 12/19/2013 PASARR number received from [employer] on 12/19/2013  FL2 transmitted to all facilities in geographic area requested by pt/family on  12/19/2013 FL2 transmitted to all facilities within larger geographic area on   Patient informed that his/her managed care company has contracts with or will negotiate with  certain facilities, including the following:     Patient/family informed of bed offers received:   Patient chooses bed at  Physician recommends and patient chooses bed at    Patient to be transferred to  on   Patient to be transferred to facility by   The following physician request were entered in Epic:   Additional Comments:
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
## 2013-12-20 ENCOUNTER — Inpatient Hospital Stay (HOSPITAL_COMMUNITY): Payer: Medicare Other

## 2013-12-20 LAB — CBC
HCT: 37 % (ref 36.0–46.0)
HEMOGLOBIN: 12.4 g/dL (ref 12.0–15.0)
MCH: 30.4 pg (ref 26.0–34.0)
MCHC: 33.5 g/dL (ref 30.0–36.0)
MCV: 90.7 fL (ref 78.0–100.0)
PLATELETS: 148 10*3/uL — AB (ref 150–400)
RBC: 4.08 MIL/uL (ref 3.87–5.11)
RDW: 13.2 % (ref 11.5–15.5)
WBC: 8.8 10*3/uL (ref 4.0–10.5)

## 2013-12-20 LAB — BASIC METABOLIC PANEL
BUN: 14 mg/dL (ref 6–23)
CALCIUM: 8.3 mg/dL — AB (ref 8.4–10.5)
CO2: 23 mEq/L (ref 19–32)
Chloride: 101 mEq/L (ref 96–112)
Creatinine, Ser: 0.81 mg/dL (ref 0.50–1.10)
GFR calc Af Amer: 77 mL/min — ABNORMAL LOW (ref >90)
GFR calc non Af Amer: 67 mL/min — ABNORMAL LOW (ref >90)
GLUCOSE: 90 mg/dL (ref 70–99)
POTASSIUM: 4.1 meq/L (ref 3.7–5.3)
SODIUM: 137 meq/L (ref 137–147)

## 2013-12-20 MED ORDER — TRAMADOL HCL 50 MG PO TABS
50.0000 mg | ORAL_TABLET | Freq: Four times a day (QID) | ORAL | Status: DC | PRN
  Administered 2013-12-20 – 2013-12-22 (×3): 50 mg via ORAL
  Filled 2013-12-20 (×3): qty 1

## 2013-12-20 MED ORDER — BETHANECHOL CHLORIDE 25 MG PO TABS
25.0000 mg | ORAL_TABLET | Freq: Three times a day (TID) | ORAL | Status: DC
  Administered 2013-12-20 – 2013-12-23 (×12): 25 mg via ORAL
  Filled 2013-12-20 (×15): qty 1

## 2013-12-20 MED ORDER — ALPRAZOLAM 0.25 MG PO TABS
0.2500 mg | ORAL_TABLET | Freq: Three times a day (TID) | ORAL | Status: DC
  Administered 2013-12-20 – 2013-12-25 (×16): 0.25 mg via ORAL
  Filled 2013-12-20 (×16): qty 1

## 2013-12-20 MED ORDER — OXYCODONE HCL 5 MG PO TABS
5.0000 mg | ORAL_TABLET | ORAL | Status: DC | PRN
  Administered 2013-12-20 – 2013-12-23 (×9): 10 mg via ORAL
  Administered 2013-12-23: 5 mg via ORAL
  Administered 2013-12-24 – 2013-12-25 (×6): 10 mg via ORAL
  Filled 2013-12-20 (×5): qty 2
  Filled 2013-12-20: qty 1
  Filled 2013-12-20 (×10): qty 2

## 2013-12-20 NOTE — Progress Notes (Signed)
Patient ID: Brenda [name], female   DOB: 03-24-1931, 80 y.o.   MRN: [id number] Afeb, vss. Looks good today with good verbal output, and follows complex commands bilaterally. She got sleepy from meds last night and had another CT head that is stable. Will continue present rx, and repeat CT on Saturday.

## 2013-12-20 NOTE — Progress Notes (Signed)
Patient's systolic blood pressure is now in the 80s and 90s. MD notified. 500cc NS bolus ordered. [place] Planas

## 2013-12-20 NOTE — Progress Notes (Signed)
Pt complains of left leg pain and calf is tender to touch. When asked patient to extend and flex foot [name] says it hurt worse. Lower extremities are warm and pulses are +2 bilaterally. No redness noted on left leg. Pt was given 10 mg of oxycodone at 0942. Reassess pain and the patient stated she had no relief from the medication. Dr. Grandville Silos on unit and notified him of patient complaint. No new orders received at this time. Will continue to monitor.
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
## 2013-12-20 NOTE — Progress Notes (Signed)
Patient is less arousable and confused about place, situation, and age. Patient's pupils are still equal and brisk. She is also moving all extremities. 50 mcg of fentanyl given at 2014 and 1.5 mg xanax given at 2204. MD notified of change. CT scan ordered. [place] Planas

## 2013-12-20 NOTE — Progress Notes (Signed)
Patient ID: Brenda [name], female   DOB: August 08, 1930, 80 y.o.   MRN: [id number] i saw the patient about 7 pm and she was stable, talking, alert x 2. Then i was called by the rn ABOUT 11 PM  Because she was not following commands and she was slurry in her speech. This was 1 hour AFTER she got 1 mg of xanax and iv fentanyl. Just in case a ct head was done. No official report as yet but to my eyes is not better or worse htan the one done earlier today. i did speak with Monica, rh, in 3 occasions and she is stable . Xanax and fentanyl were cancelled.

## 2013-12-21 LAB — CBC
HEMATOCRIT: 34.1 % — AB (ref 36.0–46.0)
Hemoglobin: 11.9 g/dL — ABNORMAL LOW (ref 12.0–15.0)
MCH: 31 pg (ref 26.0–34.0)
MCHC: 34.9 g/dL (ref 30.0–36.0)
MCV: 88.8 fL (ref 78.0–100.0)
Platelets: 175 10*3/uL (ref 150–400)
RBC: 3.84 MIL/uL — ABNORMAL LOW (ref 3.87–5.11)
RDW: 12.9 % (ref 11.5–15.5)
WBC: 9.8 10*3/uL (ref 4.0–10.5)

## 2013-12-21 LAB — BASIC METABOLIC PANEL
BUN: 12 mg/dL (ref 6–23)
CHLORIDE: 96 meq/L (ref 96–112)
CO2: 27 mEq/L (ref 19–32)
Calcium: 8.9 mg/dL (ref 8.4–10.5)
Creatinine, Ser: 0.65 mg/dL (ref 0.50–1.10)
GFR calc Af Amer: >90 mL/min (ref >90)
GFR calc non Af Amer: 82 mL/min — ABNORMAL LOW (ref >90)
GLUCOSE: 116 mg/dL — AB (ref 70–99)
POTASSIUM: 4.2 meq/L (ref 3.7–5.3)
Sodium: 135 mEq/L — ABNORMAL LOW (ref 137–147)
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
## 2013-12-21 NOTE — Clinical Social Work Note (Signed)
Clinical Social Worker continuing to follow patient and family for support and discharge planning needs.  CSW spoke with patient at bedside who confirmed that she would return home with her husband who can provide 24 hour support and her children would intermittently check in.  Patient states that she has adequate equipment from previous admission in December.  Patient also states that she was receiving home health services from [facility] and would like to continue those services. PT/OT recommending home with home health and 24 hour supervision.  Spoke with RN Case Manager who will follow up with patient to discuss home health needs.    CSW signing off - please re consult if social work needs arise.  Barbette Or, [place]

## 2013-12-21 NOTE — Progress Notes (Signed)
Patient ID: Brenda [name], female   DOB: 22-Nov-1930, 80 y.o.   MRN: [id number] Afeb, vss Sitting in chair eating breakfast. No new neuro issues. Will recheck CT head tomorrow.
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
## 2013-12-22 ENCOUNTER — Inpatient Hospital Stay (HOSPITAL_COMMUNITY): Payer: Medicare Other

## 2013-12-22 LAB — CBC
HCT: 36 % (ref 36.0–46.0)
HEMOGLOBIN: 12.4 g/dL (ref 12.0–15.0)
MCH: 31.1 pg (ref 26.0–34.0)
MCHC: 34.4 g/dL (ref 30.0–36.0)
MCV: 90.2 fL (ref 78.0–100.0)
Platelets: 188 10*3/uL (ref 150–400)
RBC: 3.99 MIL/uL (ref 3.87–5.11)
RDW: 12.9 % (ref 11.5–15.5)
WBC: 8.5 10*3/uL (ref 4.0–10.5)

## 2013-12-22 LAB — BASIC METABOLIC PANEL
BUN: 12 mg/dL (ref 6–23)
CALCIUM: 9 mg/dL (ref 8.4–10.5)
CHLORIDE: 96 meq/L (ref 96–112)
CO2: 25 meq/L (ref 19–32)
Creatinine, Ser: 0.7 mg/dL (ref 0.50–1.10)
GFR calc Af Amer: >90 mL/min (ref >90)
GFR calc non Af Amer: 80 mL/min — ABNORMAL LOW (ref >90)
GLUCOSE: 106 mg/dL — AB (ref 70–99)
Potassium: 4.5 mEq/L (ref 3.7–5.3)
SODIUM: 134 meq/L — AB (ref 137–147)

## 2013-12-22 NOTE — Progress Notes (Signed)
Patient ID: Brenda [name], female   DOB: Feb 04, 1931, 80 y.o.   MRN: [id number] BP 101/67  Pulse 70  Temp(Src) 98.1 F (36.7 C) (Oral)  Resp 24  Ht 5\' 3"  (1.6 m)  Wt 66.3 kg (146 lb 2.6 oz)  BMI 25.90 kg/m2  SpO2 93% Alert and oriented x 4 Symmetric facies perrl Tongue and uvula midline Head CT stable. Small subdural, basal cisterns patent/

## 2013-12-22 NOTE — Progress Notes (Signed)
Pt temp 101.5. MD made aware. Orders given to continue to encourage incentive spirometer.
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
## 2013-12-23 MED ORDER — POLYETHYLENE GLYCOL 3350 17 G PO PACK
17.0000 g | PACK | Freq: Every day | ORAL | Status: DC
Start: 2013-12-23 — End: 2013-12-25
  Administered 2013-12-23 – 2013-12-24 (×2): 17 g via ORAL
  Filled 2013-12-23 (×3): qty 1

## 2013-12-23 MED ORDER — SENNOSIDES-DOCUSATE SODIUM 8.6-50 MG PO TABS
3.0000 | ORAL_TABLET | Freq: Every evening | ORAL | Status: DC | PRN
Start: 2013-12-23 — End: 2013-12-25
  Administered 2013-12-25: 3 via ORAL
  Filled 2013-12-23: qty 3
  Filled 2013-12-23: qty 1

## 2013-12-23 MED ORDER — DOCUSATE SODIUM 100 MG PO CAPS
100.0000 mg | ORAL_CAPSULE | Freq: Two times a day (BID) | ORAL | Status: DC
  Administered 2013-12-23 – 2013-12-25 (×5): 100 mg via ORAL
  Filled 2013-12-23 (×5): qty 1
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
## 2013-12-23 NOTE — Progress Notes (Signed)
Patient ID: Brenda [name], female   DOB: [birth year], 80 y.o.   MRN: [id number] BP 133/63  Pulse 78  Temp(Src) 97.9 F (36.6 C) (Oral)  Resp 18  Ht 5\' 3"  (1.6 m)  Wt 66.3 kg (146 lb 2.6 oz)  BMI 25.90 kg/m2  SpO2 95% Alert, oriented x 4, speech is clear, fluent Perrl, full eom Symmetric facies, symmetric facial sensation Tongue and uvula midline Moving all extremities well Wound is clean, dry, no signs of infection.

## 2013-12-23 NOTE — Progress Notes (Signed)
Pt ambulated in hallway with therapy. O2 removed. Pt sats 89/90 on room air with ambulation.

## 2013-12-23 NOTE — Progress Notes (Signed)
Seen and I agree. Matt B. Hassell Done, MD, [facility] [phone] beeper [phone]  12/23/2013 11:22 AM
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
## 2013-12-24 ENCOUNTER — Inpatient Hospital Stay (HOSPITAL_COMMUNITY): Payer: Medicare Other

## 2013-12-24 DIAGNOSIS — S270XXA Traumatic pneumothorax, initial encounter: Secondary | ICD-10-CM | POA: Diagnosis present

## 2013-12-24 DIAGNOSIS — R339 Retention of urine, unspecified: Secondary | ICD-10-CM | POA: Diagnosis not present

## 2013-12-24 DIAGNOSIS — S2242XA Multiple fractures of ribs, left side, initial encounter for closed fracture: Secondary | ICD-10-CM | POA: Diagnosis present

## 2013-12-24 LAB — CBC
HEMATOCRIT: 34.6 % — AB (ref 36.0–46.0)
Hemoglobin: 12.1 g/dL (ref 12.0–15.0)
MCH: 30.9 pg (ref 26.0–34.0)
MCHC: 35 g/dL (ref 30.0–36.0)
MCV: 88.3 fL (ref 78.0–100.0)
Platelets: 231 10*3/uL (ref 150–400)
RBC: 3.92 MIL/uL (ref 3.87–5.11)
RDW: 12.7 % (ref 11.5–15.5)
WBC: 9 10*3/uL (ref 4.0–10.5)

## 2013-12-24 LAB — COMPREHENSIVE METABOLIC PANEL
ALT: 13 U/L (ref 0–35)
AST: 16 U/L (ref 0–37)
Albumin: 3 g/dL — ABNORMAL LOW (ref 3.5–5.2)
Alkaline Phosphatase: 57 U/L (ref 39–117)
BILIRUBIN TOTAL: 0.7 mg/dL (ref 0.3–1.2)
BUN: 22 mg/dL (ref 6–23)
CALCIUM: 9 mg/dL (ref 8.4–10.5)
CO2: 28 mEq/L (ref 19–32)
Chloride: 92 mEq/L — ABNORMAL LOW (ref 96–112)
Creatinine, Ser: 0.81 mg/dL (ref 0.50–1.10)
GFR calc non Af Amer: 67 mL/min — ABNORMAL LOW (ref >90)
GFR, EST AFRICAN AMERICAN: 77 mL/min — AB (ref >90)
Glucose, Bld: 114 mg/dL — ABNORMAL HIGH (ref 70–99)
Potassium: 3.5 mEq/L — ABNORMAL LOW (ref 3.7–5.3)
Sodium: 134 mEq/L — ABNORMAL LOW (ref 137–147)
Total Protein: 7.4 g/dL (ref 6.0–8.3)

## 2013-12-24 MED ORDER — MORPHINE SULFATE 2 MG/ML IJ SOLN: 1.0000 mg | INTRAMUSCULAR | Status: DC | PRN
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
## 2013-12-24 NOTE — Progress Notes (Signed)
Ambulatory O2 test done.  Pt was 96% on RA at rest, 89-92% while ambulating on RA.  Pt placed back on 2L O2 and is resting.  Will monitor.

## 2013-12-24 NOTE — Progress Notes (Signed)
Patient doing well but still complaining of left sided neck pain.  CT C-spine was negative, and the patient is neurologically intact.  Limited ROM.    This patient has been seen and I agree with the findings and treatment plan.  Kathryne Eriksson. Dahlia Bailiff, MD, [place] [phone] (pager) [phone] (direct pager) Trauma Surgeon

## 2013-12-25 MED ORDER — HYDROCODONE-ACETAMINOPHEN 5-325 MG PO TABS: 0.5000 | ORAL_TABLET | ORAL | Status: DC | PRN
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
## 2013-12-25 NOTE — Discharge Summary (Signed)
Brenda Riesen, MD, MPH, FACS Trauma: [phone] General Surgery: [phone]  

## 2013-12-25 NOTE — Progress Notes (Signed)
Spoke with pt about HH needs and DME recommendations. Pt reports already having the recommended DME at home so no arrangements were made. Pt reported having used [facility] for her previous needs after her December 2014 admission. I explained that she still had a choice of agencies.  Pt stated that the [facility] care was good and she wished to remain with them.  Confirmed her address and phone number(s) in EPIC as correct. HHPT/OT/RN arranged.  Discussed [facility]HAide with pt but she didn't feel she needed that.
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
## 2013-12-25 NOTE — Progress Notes (Signed)
Ambulatory 02 test completed. Pt was 83 on RA at rest, 91-93% ambulating on RA, and 94% RA post ambulating after deep breathing encouraged. No SOB. PA notified for discharge planning
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
## 2013-12-26 DIAGNOSIS — S065X6A Traumatic subdural hemorrhage with loss of consciousness greater than 24 hours without return to pre-existing conscious level with patient surviving, initial encounter: Secondary | ICD-10-CM | POA: Diagnosis not present

## 2013-12-26 DIAGNOSIS — I10 Essential (primary) hypertension: Secondary | ICD-10-CM | POA: Diagnosis not present

## 2013-12-26 DIAGNOSIS — S3609XA Other injury of spleen, initial encounter: Secondary | ICD-10-CM | POA: Diagnosis not present

## 2013-12-26 DIAGNOSIS — IMO0001 Reserved for inherently not codable concepts without codable children: Secondary | ICD-10-CM | POA: Diagnosis not present

## 2013-12-27 DIAGNOSIS — IMO0001 Reserved for inherently not codable concepts without codable children: Secondary | ICD-10-CM | POA: Diagnosis not present

## 2013-12-27 DIAGNOSIS — I10 Essential (primary) hypertension: Secondary | ICD-10-CM | POA: Diagnosis not present

## 2013-12-27 DIAGNOSIS — S065X6A Traumatic subdural hemorrhage with loss of consciousness greater than 24 hours without return to pre-existing conscious level with patient surviving, initial encounter: Secondary | ICD-10-CM | POA: Diagnosis not present

## 2013-12-27 DIAGNOSIS — S3609XA Other injury of spleen, initial encounter: Secondary | ICD-10-CM | POA: Diagnosis not present

## 2013-12-28 DIAGNOSIS — S3609XA Other injury of spleen, initial encounter: Secondary | ICD-10-CM | POA: Diagnosis not present

## 2013-12-28 DIAGNOSIS — IMO0001 Reserved for inherently not codable concepts without codable children: Secondary | ICD-10-CM | POA: Diagnosis not present

## 2013-12-28 DIAGNOSIS — I10 Essential (primary) hypertension: Secondary | ICD-10-CM | POA: Diagnosis not present

## 2013-12-28 DIAGNOSIS — S065X6A Traumatic subdural hemorrhage with loss of consciousness greater than 24 hours without return to pre-existing conscious level with patient surviving, initial encounter: Secondary | ICD-10-CM | POA: Diagnosis not present

## 2013-12-29 DIAGNOSIS — I10 Essential (primary) hypertension: Secondary | ICD-10-CM | POA: Diagnosis not present

## 2013-12-29 DIAGNOSIS — S3609XA Other injury of spleen, initial encounter: Secondary | ICD-10-CM | POA: Diagnosis not present

## 2013-12-29 DIAGNOSIS — IMO0001 Reserved for inherently not codable concepts without codable children: Secondary | ICD-10-CM | POA: Diagnosis not present

## 2013-12-29 DIAGNOSIS — S065X6A Traumatic subdural hemorrhage with loss of consciousness greater than 24 hours without return to pre-existing conscious level with patient surviving, initial encounter: Secondary | ICD-10-CM | POA: Diagnosis not present

## 2013-12-30 DIAGNOSIS — I10 Essential (primary) hypertension: Secondary | ICD-10-CM | POA: Diagnosis not present

## 2013-12-30 DIAGNOSIS — S3609XA Other injury of spleen, initial encounter: Secondary | ICD-10-CM | POA: Diagnosis not present

## 2013-12-30 DIAGNOSIS — IMO0001 Reserved for inherently not codable concepts without codable children: Secondary | ICD-10-CM | POA: Diagnosis not present

## 2013-12-30 DIAGNOSIS — S065X6A Traumatic subdural hemorrhage with loss of consciousness greater than 24 hours without return to pre-existing conscious level with patient surviving, initial encounter: Secondary | ICD-10-CM | POA: Diagnosis not present

## 2014-01-01 DIAGNOSIS — S065X6A Traumatic subdural hemorrhage with loss of consciousness greater than 24 hours without return to pre-existing conscious level with patient surviving, initial encounter: Secondary | ICD-10-CM | POA: Diagnosis not present

## 2014-01-01 DIAGNOSIS — IMO0001 Reserved for inherently not codable concepts without codable children: Secondary | ICD-10-CM | POA: Diagnosis not present

## 2014-01-01 DIAGNOSIS — S3609XA Other injury of spleen, initial encounter: Secondary | ICD-10-CM | POA: Diagnosis not present

## 2014-01-01 DIAGNOSIS — I10 Essential (primary) hypertension: Secondary | ICD-10-CM | POA: Diagnosis not present

## 2014-01-03 ENCOUNTER — Encounter (HOSPITAL_COMMUNITY): Payer: Self-pay | Admitting: Emergency Medicine

## 2014-01-03 ENCOUNTER — Inpatient Hospital Stay (HOSPITAL_COMMUNITY)
Admission: EM | Admit: 2014-01-03 | Discharge: 2014-01-16 | DRG: 027 | Disposition: A | Discharge status: Rehabilitation Facility | Payer: Medicare Other | Attending: Neurosurgery | Admitting: Neurosurgery

## 2014-01-03 ENCOUNTER — Emergency Department (HOSPITAL_COMMUNITY): Payer: Medicare Other

## 2014-01-03 DIAGNOSIS — Z825 Family history of asthma and other chronic lower respiratory diseases: Secondary | ICD-10-CM

## 2014-01-03 DIAGNOSIS — Z808 Family history of malignant neoplasm of other organs or systems: Secondary | ICD-10-CM | POA: Diagnosis not present

## 2014-01-03 DIAGNOSIS — Z807 Family history of other malignant neoplasms of lymphoid, hematopoietic and related tissues: Secondary | ICD-10-CM | POA: Diagnosis not present

## 2014-01-03 DIAGNOSIS — K589 Irritable bowel syndrome without diarrhea: Secondary | ICD-10-CM | POA: Diagnosis present

## 2014-01-03 DIAGNOSIS — R29810 Facial weakness: Secondary | ICD-10-CM | POA: Diagnosis not present

## 2014-01-03 DIAGNOSIS — M1990 Unspecified osteoarthritis, unspecified site: Secondary | ICD-10-CM | POA: Diagnosis present

## 2014-01-03 DIAGNOSIS — F411 Generalized anxiety disorder: Secondary | ICD-10-CM | POA: Diagnosis present

## 2014-01-03 DIAGNOSIS — R51 Headache: Secondary | ICD-10-CM | POA: Diagnosis not present

## 2014-01-03 DIAGNOSIS — D869 Sarcoidosis, unspecified: Secondary | ICD-10-CM | POA: Diagnosis not present

## 2014-01-03 DIAGNOSIS — I6200 Nontraumatic subdural hemorrhage, unspecified: Secondary | ICD-10-CM | POA: Diagnosis not present

## 2014-01-03 DIAGNOSIS — M3500 Sicca syndrome, unspecified: Secondary | ICD-10-CM | POA: Diagnosis present

## 2014-01-03 DIAGNOSIS — S065X9A Traumatic subdural hemorrhage with loss of consciousness of unspecified duration, initial encounter: Principal | ICD-10-CM | POA: Diagnosis present

## 2014-01-03 DIAGNOSIS — R1311 Dysphagia, oral phase: Secondary | ICD-10-CM | POA: Diagnosis not present

## 2014-01-03 DIAGNOSIS — IMO0002 Reserved for concepts with insufficient information to code with codable children: Secondary | ICD-10-CM | POA: Diagnosis not present

## 2014-01-03 DIAGNOSIS — S069XAA Unspecified intracranial injury with loss of consciousness status unknown, initial encounter: Secondary | ICD-10-CM | POA: Diagnosis not present

## 2014-01-03 DIAGNOSIS — Z791 Long term (current) use of non-steroidal anti-inflammatories (NSAID): Secondary | ICD-10-CM

## 2014-01-03 DIAGNOSIS — K2270 Barrett's esophagus without dysplasia: Secondary | ICD-10-CM | POA: Diagnosis not present

## 2014-01-03 DIAGNOSIS — S065XAA Traumatic subdural hemorrhage with loss of consciousness status unknown, initial encounter: Principal | ICD-10-CM

## 2014-01-03 DIAGNOSIS — Z8051 Family history of malignant neoplasm of kidney: Secondary | ICD-10-CM

## 2014-01-03 DIAGNOSIS — S3609XA Other injury of spleen, initial encounter: Secondary | ICD-10-CM | POA: Diagnosis not present

## 2014-01-03 DIAGNOSIS — E785 Hyperlipidemia, unspecified: Secondary | ICD-10-CM | POA: Diagnosis present

## 2014-01-03 DIAGNOSIS — R569 Unspecified convulsions: Secondary | ICD-10-CM | POA: Diagnosis not present

## 2014-01-03 DIAGNOSIS — R1313 Dysphagia, pharyngeal phase: Secondary | ICD-10-CM | POA: Diagnosis not present

## 2014-01-03 DIAGNOSIS — R930 Abnormal findings on diagnostic imaging of skull and head, not elsewhere classified: Secondary | ICD-10-CM | POA: Diagnosis not present

## 2014-01-03 DIAGNOSIS — K219 Gastro-esophageal reflux disease without esophagitis: Secondary | ICD-10-CM | POA: Diagnosis present

## 2014-01-03 DIAGNOSIS — S069X9A Unspecified intracranial injury with loss of consciousness of unspecified duration, initial encounter: Secondary | ICD-10-CM | POA: Diagnosis not present

## 2014-01-03 DIAGNOSIS — Z8249 Family history of ischemic heart disease and other diseases of the circulatory system: Secondary | ICD-10-CM

## 2014-01-03 DIAGNOSIS — R1310 Dysphagia, unspecified: Secondary | ICD-10-CM | POA: Diagnosis not present

## 2014-01-03 DIAGNOSIS — IMO0001 Reserved for inherently not codable concepts without codable children: Secondary | ICD-10-CM | POA: Diagnosis not present

## 2014-01-03 DIAGNOSIS — R29898 Other symptoms and signs involving the musculoskeletal system: Secondary | ICD-10-CM | POA: Diagnosis not present

## 2014-01-03 DIAGNOSIS — W19XXXA Unspecified fall, initial encounter: Secondary | ICD-10-CM | POA: Diagnosis not present

## 2014-01-03 DIAGNOSIS — I10 Essential (primary) hypertension: Secondary | ICD-10-CM | POA: Diagnosis present

## 2014-01-03 DIAGNOSIS — R110 Nausea: Secondary | ICD-10-CM | POA: Diagnosis not present

## 2014-01-03 DIAGNOSIS — R0602 Shortness of breath: Secondary | ICD-10-CM | POA: Diagnosis not present

## 2014-01-03 DIAGNOSIS — E049 Nontoxic goiter, unspecified: Secondary | ICD-10-CM | POA: Diagnosis present

## 2014-01-03 DIAGNOSIS — Z800 Family history of malignant neoplasm of digestive organs: Secondary | ICD-10-CM | POA: Diagnosis not present

## 2014-01-03 DIAGNOSIS — S065X6A Traumatic subdural hemorrhage with loss of consciousness greater than 24 hours without return to pre-existing conscious level with patient surviving, initial encounter: Secondary | ICD-10-CM | POA: Diagnosis not present

## 2014-01-03 DIAGNOSIS — Z5189 Encounter for other specified aftercare: Secondary | ICD-10-CM | POA: Diagnosis not present

## 2014-01-03 MED ORDER — ALPRAZOLAM 0.5 MG PO TABS
0.5000 mg | ORAL_TABLET | Freq: Two times a day (BID) | ORAL | Status: DC
  Filled 2014-01-03: qty 1

## 2014-01-03 MED ORDER — MORPHINE SULFATE 2 MG/ML IJ SOLN
1.0000 mg | INTRAMUSCULAR | Status: DC | PRN
  Administered 2014-01-04 (×3): 1 mg via INTRAVENOUS
  Filled 2014-01-03 (×3): qty 1

## 2014-01-03 MED ORDER — BIOTIN 5000 MCG PO TABS: 5000.0000 ug | ORAL_TABLET | Freq: Every day | ORAL | Status: DC

## 2014-01-03 MED ORDER — VITAMIN D3 25 MCG (1000 UNIT) PO TABS
1000.0000 [IU] | ORAL_TABLET | Freq: Every day | ORAL | Status: DC
  Filled 2014-01-03: qty 1

## 2014-01-03 MED ORDER — SODIUM CHLORIDE 0.9 % IV SOLN
INTRAVENOUS | Status: DC
  Administered 2014-01-04: 01:00:00 via INTRAVENOUS

## 2014-01-03 MED ORDER — PANTOPRAZOLE SODIUM 40 MG PO TBEC: 40.0000 mg | DELAYED_RELEASE_TABLET | Freq: Every day | ORAL | Status: DC

## 2014-01-03 MED ORDER — SENNOSIDES-DOCUSATE SODIUM 8.6-50 MG PO TABS
1.0000 | ORAL_TABLET | Freq: Two times a day (BID) | ORAL | Status: DC
  Administered 2014-01-04: 1 via ORAL
  Filled 2014-01-03 (×3): qty 1

## 2014-01-03 MED ORDER — LOTEPREDNOL ETABONATE 0.5 % OP SUSP
1.0000 [drp] | Freq: Two times a day (BID) | OPHTHALMIC | Status: DC
  Administered 2014-01-04: 1 [drp] via OPHTHALMIC
  Filled 2014-01-03: qty 5

## 2014-01-03 MED ORDER — ACETAMINOPHEN 325 MG PO TABS
650.0000 mg | ORAL_TABLET | ORAL | Status: DC | PRN
Start: 2014-01-03 — End: 2014-01-04

## 2014-01-03 MED ORDER — CYCLOSPORINE 0.05 % OP EMUL
1.0000 [drp] | Freq: Two times a day (BID) | OPHTHALMIC | Status: DC
  Administered 2014-01-04 (×2): 1 [drp] via OPHTHALMIC
  Filled 2014-01-03 (×3): qty 1

## 2014-01-03 MED ORDER — LEVOTHYROXINE SODIUM 75 MCG PO TABS
75.0000 ug | ORAL_TABLET | Freq: Every day | ORAL | Status: DC
  Administered 2014-01-04: 75 ug via ORAL
  Filled 2014-01-03 (×2): qty 1

## 2014-01-03 MED ORDER — ACETAMINOPHEN 650 MG RE SUPP: 650.0000 mg | RECTAL | Status: DC | PRN

## 2014-01-03 MED ORDER — ADULT MULTIVITAMIN W/MINERALS CH
1.0000 | ORAL_TABLET | Freq: Every day | ORAL | Status: DC
  Filled 2014-01-03: qty 1

## 2014-01-03 MED ORDER — HYDROCODONE-ACETAMINOPHEN 5-325 MG PO TABS
0.5000 | ORAL_TABLET | ORAL | Status: DC | PRN
  Administered 2014-01-03 – 2014-01-04 (×3): 1 via ORAL
  Filled 2014-01-03 (×3): qty 1

## 2014-01-03 MED ORDER — ALPRAZOLAM 0.5 MG PO TABS
1.5000 mg | ORAL_TABLET | Freq: Every day | ORAL | Status: DC
  Administered 2014-01-04: 0.5 mg via ORAL

## 2014-01-03 MED ORDER — AMLODIPINE BESYLATE 5 MG PO TABS
5.0000 mg | ORAL_TABLET | Freq: Every day | ORAL | Status: DC
  Filled 2014-01-03: qty 1

## 2014-01-03 MED ORDER — LABETALOL HCL 5 MG/ML IV SOLN: 10.0000 mg | INTRAVENOUS | Status: DC | PRN

## 2014-01-03 MED ORDER — TRIAMTERENE-HCTZ 75-50 MG PO TABS
1.0000 | ORAL_TABLET | Freq: Every day | ORAL | Status: DC
  Filled 2014-01-03: qty 1

## 2014-01-03 MED ORDER — ALPRAZOLAM 0.5 MG PO TABS: 0.5000 mg | ORAL_TABLET | Freq: Three times a day (TID) | ORAL | Status: DC

## 2014-01-03 NOTE — ED Notes (Signed)
Regular Diet ordered 

## 2014-01-03 NOTE — Progress Notes (Signed)

## 2014-01-03 NOTE — ED Provider Notes (Signed)
Patient seen/examined in the Emergency Department in conjunction with Resident Physician Provider  Patient reports headache Exam : awake/alert, no arm drift, no facial droop Plan: CT imaging pending at this time.  Pt currently stable   Sharyon Cable, MD 01/03/14 1631
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
## 2014-01-03 NOTE — ED Notes (Signed)
Pt weak all over. States her HA has been getting worse over the past week. Taking increasing doses of vicodin. Pt was to follow up with neuro tomorrow but instructed to come to ed by hhn
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
## 2014-01-03 NOTE — ED Notes (Signed)
Ice pack given for pt.head. Pt states that it helps pain

## 2014-01-04 ENCOUNTER — Encounter (HOSPITAL_COMMUNITY): Payer: Medicare Other | Admitting: Anesthesiology

## 2014-01-04 ENCOUNTER — Inpatient Hospital Stay (HOSPITAL_COMMUNITY): Payer: Medicare Other

## 2014-01-04 ENCOUNTER — Encounter (HOSPITAL_COMMUNITY): Payer: Self-pay | Admitting: Anesthesiology

## 2014-01-04 ENCOUNTER — Encounter (HOSPITAL_COMMUNITY)
Admission: EM | Disposition: A | Discharge status: Rehabilitation Facility | Payer: Self-pay | Source: Home / Self Care | Attending: Neurosurgery

## 2014-01-04 ENCOUNTER — Inpatient Hospital Stay (HOSPITAL_COMMUNITY): Payer: Medicare Other | Admitting: Anesthesiology

## 2014-01-04 HISTORY — PX: CRANIOTOMY: SHX93

## 2014-01-04 LAB — COMPREHENSIVE METABOLIC PANEL
ALBUMIN: 3.3 g/dL — AB (ref 3.5–5.2)
ALK PHOS: 128 U/L — AB (ref 39–117)
ALT: 8 U/L (ref 0–35)
AST: 17 U/L (ref 0–37)
BUN: 11 mg/dL (ref 6–23)
CALCIUM: 8.6 mg/dL (ref 8.4–10.5)
CO2: 26 mEq/L (ref 19–32)
Chloride: 97 mEq/L (ref 96–112)
Creatinine, Ser: 0.84 mg/dL (ref 0.50–1.10)
GFR calc non Af Amer: 64 mL/min — ABNORMAL LOW (ref >90)
GFR, EST AFRICAN AMERICAN: 74 mL/min — AB (ref >90)
GLUCOSE: 131 mg/dL — AB (ref 70–99)
Potassium: 2.9 mEq/L — CL (ref 3.7–5.3)
SODIUM: 139 meq/L (ref 137–147)
TOTAL PROTEIN: 7.4 g/dL (ref 6.0–8.3)
Total Bilirubin: 0.4 mg/dL (ref 0.3–1.2)

## 2014-01-04 LAB — PROTIME-INR
INR: 1.02 (ref 0.00–1.49)
PROTHROMBIN TIME: 13.2 s (ref 11.6–15.2)

## 2014-01-04 LAB — CBC
HCT: 34.9 % — ABNORMAL LOW (ref 36.0–46.0)
Hemoglobin: 11.7 g/dL — ABNORMAL LOW (ref 12.0–15.0)
MCH: 29.5 pg (ref 26.0–34.0)
MCHC: 33.5 g/dL (ref 30.0–36.0)
MCV: 88.1 fL (ref 78.0–100.0)
Platelets: 345 10*3/uL (ref 150–400)
RBC: 3.96 MIL/uL (ref 3.87–5.11)
RDW: 12.9 % (ref 11.5–15.5)
WBC: 9.6 10*3/uL (ref 4.0–10.5)

## 2014-01-04 LAB — SURGICAL PCR SCREEN
MRSA, PCR: NEGATIVE
Staphylococcus aureus: NEGATIVE

## 2014-01-04 SURGERY — CRANIOTOMY HEMATOMA EVACUATION SUBDURAL
Anesthesia: General | Laterality: Right

## 2014-01-04 MED ORDER — LIDOCAINE HCL (CARDIAC) 20 MG/ML IV SOLN
INTRAVENOUS | Status: AC
Start: 2014-01-04 — End: 2014-01-04
  Filled 2014-01-04: qty 5

## 2014-01-04 MED ORDER — GLYCOPYRROLATE 0.2 MG/ML IJ SOLN
INTRAMUSCULAR | Status: AC
  Filled 2014-01-04: qty 1

## 2014-01-04 MED ORDER — GLYCOPYRROLATE 0.2 MG/ML IJ SOLN
INTRAMUSCULAR | Status: DC | PRN
  Administered 2014-01-04: 0.4 mg via INTRAVENOUS

## 2014-01-04 MED ORDER — PROPOFOL 10 MG/ML IV BOLUS
INTRAVENOUS | Status: AC
  Filled 2014-01-04: qty 20

## 2014-01-04 MED ORDER — HYDROCODONE-ACETAMINOPHEN 5-325 MG PO TABS
1.0000 | ORAL_TABLET | ORAL | Status: DC | PRN
  Administered 2014-01-04 – 2014-01-15 (×15): 1 via ORAL
  Filled 2014-01-04 (×3): qty 1
  Filled 2014-01-04: qty 2
  Filled 2014-01-04 (×11): qty 1

## 2014-01-04 MED ORDER — ROCURONIUM BROMIDE 100 MG/10ML IV SOLN
INTRAVENOUS | Status: DC | PRN
  Administered 2014-01-04: 40 mg via INTRAVENOUS

## 2014-01-04 MED ORDER — 0.9 % SODIUM CHLORIDE (POUR BTL) OPTIME
TOPICAL | Status: DC | PRN
  Administered 2014-01-04 (×2): 1000 mL

## 2014-01-04 MED ORDER — KCL IN DEXTROSE-NACL 20-5-0.45 MEQ/L-%-% IV SOLN
INTRAVENOUS | Status: DC
  Administered 2014-01-04 – 2014-01-05 (×2): via INTRAVENOUS
  Administered 2014-01-06: 75 mL via INTRAVENOUS
  Administered 2014-01-06 – 2014-01-08 (×4): via INTRAVENOUS
  Administered 2014-01-10 – 2014-01-11 (×2): 75 mL/h via INTRAVENOUS
  Administered 2014-01-11 – 2014-01-15 (×7): via INTRAVENOUS
  Filled 2014-01-04 (×24): qty 1000

## 2014-01-04 MED ORDER — POTASSIUM CHLORIDE CRYS ER 20 MEQ PO TBCR
40.0000 meq | EXTENDED_RELEASE_TABLET | Freq: Once | ORAL | Status: AC
  Administered 2014-01-04: 40 meq via ORAL
  Filled 2014-01-04: qty 2

## 2014-01-04 MED ORDER — LACTATED RINGERS IV SOLN
INTRAVENOUS | Status: DC | PRN
  Administered 2014-01-04: 18:00:00 via INTRAVENOUS

## 2014-01-04 MED ORDER — LEVETIRACETAM 500 MG/5ML IV SOLN
500.0000 mg | Freq: Two times a day (BID) | INTRAVENOUS | Status: DC
  Administered 2014-01-04 – 2014-01-10 (×12): 500 mg via INTRAVENOUS
  Filled 2014-01-04 (×13): qty 5

## 2014-01-04 MED ORDER — SODIUM CHLORIDE 0.9 % IR SOLN
Status: DC | PRN
  Administered 2014-01-04: 19:00:00

## 2014-01-04 MED ORDER — CEFAZOLIN SODIUM 1-5 GM-% IV SOLN
1.0000 g | Freq: Three times a day (TID) | INTRAVENOUS | Status: AC
  Administered 2014-01-04 – 2014-01-05 (×2): 1 g via INTRAVENOUS
  Filled 2014-01-04 (×2): qty 50

## 2014-01-04 MED ORDER — LIDOCAINE HCL (PF) 1 % IJ SOLN
INTRAMUSCULAR | Status: DC | PRN
  Administered 2014-01-04: 8 mL

## 2014-01-04 MED ORDER — NEOSTIGMINE METHYLSULFATE 10 MG/10ML IV SOLN
INTRAVENOUS | Status: AC
  Filled 2014-01-04: qty 1

## 2014-01-04 MED ORDER — PANTOPRAZOLE SODIUM 40 MG IV SOLR
40.0000 mg | Freq: Every day | INTRAVENOUS | Status: DC
  Administered 2014-01-04 – 2014-01-07 (×4): 40 mg via INTRAVENOUS
  Filled 2014-01-04 (×5): qty 40

## 2014-01-04 MED ORDER — NEOSTIGMINE METHYLSULFATE 10 MG/10ML IV SOLN
INTRAVENOUS | Status: DC | PRN
  Administered 2014-01-04: 3.5 mg via INTRAVENOUS

## 2014-01-04 MED ORDER — ONDANSETRON HCL 4 MG/2ML IJ SOLN
INTRAMUSCULAR | Status: DC | PRN
  Administered 2014-01-04: 4 mg via INTRAVENOUS

## 2014-01-04 MED ORDER — FENTANYL CITRATE 0.05 MG/ML IJ SOLN: 25.0000 ug | INTRAMUSCULAR | Status: DC | PRN

## 2014-01-04 MED ORDER — ACETAMINOPHEN 325 MG PO TABS: 650.0000 mg | ORAL_TABLET | ORAL | Status: DC | PRN

## 2014-01-04 MED ORDER — FENTANYL CITRATE 0.05 MG/ML IJ SOLN
INTRAMUSCULAR | Status: DC | PRN
  Administered 2014-01-04: 100 ug via INTRAVENOUS

## 2014-01-04 MED ORDER — PROMETHAZINE HCL 25 MG PO TABS: 12.5000 mg | ORAL_TABLET | ORAL | Status: DC | PRN

## 2014-01-04 MED ORDER — ACETAMINOPHEN 650 MG RE SUPP: 650.0000 mg | RECTAL | Status: DC | PRN

## 2014-01-04 MED ORDER — ONDANSETRON HCL 4 MG/2ML IJ SOLN
4.0000 mg | INTRAMUSCULAR | Status: DC | PRN
  Administered 2014-01-08: 4 mg via INTRAVENOUS
  Filled 2014-01-04 (×2): qty 2

## 2014-01-04 MED ORDER — METOCLOPRAMIDE HCL 5 MG/ML IJ SOLN: 10.0000 mg | Freq: Once | INTRAMUSCULAR | Status: DC | PRN

## 2014-01-04 MED ORDER — ONDANSETRON HCL 4 MG PO TABS: 4.0000 mg | ORAL_TABLET | ORAL | Status: DC | PRN

## 2014-01-04 MED ORDER — ONDANSETRON HCL 4 MG/2ML IJ SOLN
INTRAMUSCULAR | Status: AC
  Filled 2014-01-04: qty 2

## 2014-01-04 MED ORDER — LIDOCAINE HCL (CARDIAC) 20 MG/ML IV SOLN
INTRAVENOUS | Status: DC | PRN
  Administered 2014-01-04: 60 mg via INTRAVENOUS

## 2014-01-04 MED ORDER — ONDANSETRON HCL 4 MG/2ML IJ SOLN
4.0000 mg | Freq: Four times a day (QID) | INTRAMUSCULAR | Status: DC | PRN
  Administered 2014-01-04: 4 mg via INTRAVENOUS

## 2014-01-04 MED ORDER — LABETALOL HCL 5 MG/ML IV SOLN: 10.0000 mg | INTRAVENOUS | Status: DC | PRN

## 2014-01-04 MED ORDER — THROMBIN 20000 UNITS EX SOLR
CUTANEOUS | Status: DC | PRN
  Administered 2014-01-04: 18:00:00 via TOPICAL

## 2014-01-04 MED ORDER — PHENYLEPHRINE HCL 10 MG/ML IJ SOLN
10.0000 mg | INTRAMUSCULAR | Status: DC | PRN
  Administered 2014-01-04: 40 ug/min via INTRAVENOUS

## 2014-01-04 MED ORDER — FENTANYL CITRATE 0.05 MG/ML IJ SOLN
INTRAMUSCULAR | Status: AC
  Filled 2014-01-04: qty 5

## 2014-01-04 MED ORDER — SODIUM CHLORIDE 0.9 % IV SOLN
INTRAVENOUS | Status: DC | PRN
  Administered 2014-01-04: 18:00:00 via INTRAVENOUS

## 2014-01-04 MED ORDER — ROCURONIUM BROMIDE 50 MG/5ML IV SOLN
INTRAVENOUS | Status: AC
  Filled 2014-01-04: qty 1

## 2014-01-04 MED ORDER — OXYCODONE HCL 5 MG/5ML PO SOLN: 5.0000 mg | Freq: Once | ORAL | Status: DC | PRN

## 2014-01-04 MED ORDER — CEFAZOLIN SODIUM-DEXTROSE 2-3 GM-% IV SOLR
INTRAVENOUS | Status: DC | PRN
  Administered 2014-01-04: 2 g via INTRAVENOUS

## 2014-01-04 MED ORDER — MORPHINE SULFATE 2 MG/ML IJ SOLN
1.0000 mg | INTRAMUSCULAR | Status: DC | PRN
  Administered 2014-01-08: 1 mg via INTRAVENOUS
  Filled 2014-01-04 (×2): qty 1

## 2014-01-04 MED ORDER — PROPOFOL 10 MG/ML IV BOLUS
INTRAVENOUS | Status: DC | PRN
  Administered 2014-01-04: 100 mg via INTRAVENOUS

## 2014-01-04 MED ORDER — OXYCODONE HCL 5 MG PO TABS: 5.0000 mg | ORAL_TABLET | Freq: Once | ORAL | Status: DC | PRN

## 2014-01-04 MED ORDER — ONDANSETRON HCL 4 MG/2ML IJ SOLN
INTRAMUSCULAR | Status: AC
  Administered 2014-01-04: 4 mg via INTRAVENOUS
  Filled 2014-01-04: qty 2

## 2014-01-04 SURGICAL SUPPLY — 75 items
APL SKNCLS STERI-STRIP NONHPOA (GAUZE/BANDAGES/DRESSINGS) ×1
BAG DECANTER FOR FLEXI CONT (MISCELLANEOUS) ×2 IMPLANT
BANDAGE GAUZE 4  KLING STR (GAUZE/BANDAGES/DRESSINGS) IMPLANT
BENZOIN TINCTURE PRP APPL 2/3 (GAUZE/BANDAGES/DRESSINGS) ×2 IMPLANT
BIT DRILL WIRE PASS 1.3MM (BIT) ×1 IMPLANT
BLADE 10 SAFETY STRL DISP (BLADE) ×2 IMPLANT
BLADE SURG ROTATE 9660 (MISCELLANEOUS) ×2 IMPLANT
BNDG GAUZE ELAST 4 BULKY (GAUZE/BANDAGES/DRESSINGS) IMPLANT
BRUSH SCRUB EZ 1% IODOPHOR (MISCELLANEOUS) IMPLANT
BRUSH SCRUB EZ PLAIN DRY (MISCELLANEOUS) ×2 IMPLANT
BUR ROUTER D-58 CRANI (BURR) IMPLANT
CANISTER SUCT 3000ML (MISCELLANEOUS) ×2 IMPLANT
CLIP TI MEDIUM 6 (CLIP) IMPLANT
CONT SPEC 4OZ CLIKSEAL STRL BL (MISCELLANEOUS) ×2 IMPLANT
CORDS BIPOLAR (ELECTRODE) ×2 IMPLANT
COVER TABLE BACK 60X90 (DRAPES) ×4 IMPLANT
DRAIN CHANNEL 10M FLAT 3/4 FLT (DRAIN) ×1 IMPLANT
DRAPE NEUROLOGICAL W/INCISE (DRAPES) ×2 IMPLANT
DRAPE SURG 17X23 STRL (DRAPES) IMPLANT
DRAPE WARM FLUID 44X44 (DRAPE) ×2 IMPLANT
DRILL WIRE PASS 1.3MM (BIT) ×2
DRSG OPSITE POSTOP 4X10 (GAUZE/BANDAGES/DRESSINGS) ×1 IMPLANT
DRSG TELFA 3X8 NADH (GAUZE/BANDAGES/DRESSINGS) ×2 IMPLANT
ELECT CAUTERY BLADE 6.4 (BLADE) ×2 IMPLANT
ELECT REM PT RETURN 9FT ADLT (ELECTROSURGICAL) ×2
ELECTRODE REM PT RTRN 9FT ADLT (ELECTROSURGICAL) ×1 IMPLANT
EVACUATOR SILICONE 100CC (DRAIN) ×1 IMPLANT
GAUZE SPONGE 4X4 16PLY XRAY LF (GAUZE/BANDAGES/DRESSINGS) IMPLANT
GLOVE BIOGEL PI IND STRL 7.5 (GLOVE) IMPLANT
GLOVE BIOGEL PI INDICATOR 7.5 (GLOVE) ×1
GLOVE ECLIPSE 7.5 STRL STRAW (GLOVE) ×1 IMPLANT
GLOVE ECLIPSE 8.0 STRL XLNG CF (GLOVE) ×2 IMPLANT
GLOVE EXAM NITRILE LRG STRL (GLOVE) IMPLANT
GLOVE EXAM NITRILE XL STR (GLOVE) IMPLANT
GLOVE EXAM NITRILE XS STR PU (GLOVE) IMPLANT
GOWN STRL REUS W/ TWL LRG LVL3 (GOWN DISPOSABLE) IMPLANT
GOWN STRL REUS W/ TWL XL LVL3 (GOWN DISPOSABLE) IMPLANT
GOWN STRL REUS W/TWL 2XL LVL3 (GOWN DISPOSABLE) ×2 IMPLANT
GOWN STRL REUS W/TWL LRG LVL3 (GOWN DISPOSABLE) ×2
GOWN STRL REUS W/TWL XL LVL3 (GOWN DISPOSABLE) ×2
HEMOSTAT SURGICEL 2X14 (HEMOSTASIS) ×2 IMPLANT
KIT BASIN OR (CUSTOM PROCEDURE TRAY) ×2 IMPLANT
KIT ROOM TURNOVER OR (KITS) ×2 IMPLANT
NEEDLE HYPO 22GX1.5 SAFETY (NEEDLE) ×2 IMPLANT
NS IRRIG 1000ML POUR BTL (IV SOLUTION) ×2 IMPLANT
PACK CRANIOTOMY (CUSTOM PROCEDURE TRAY) ×2 IMPLANT
PAD ARMBOARD 7.5X6 YLW CONV (MISCELLANEOUS) ×2 IMPLANT
PAD DRESSING TELFA 3X8 NADH (GAUZE/BANDAGES/DRESSINGS) ×1 IMPLANT
PAD EYE OVAL STERILE LF (GAUZE/BANDAGES/DRESSINGS) IMPLANT
PATTIES SURGICAL .25X.25 (GAUZE/BANDAGES/DRESSINGS) IMPLANT
PATTIES SURGICAL .5 X.5 (GAUZE/BANDAGES/DRESSINGS) IMPLANT
PATTIES SURGICAL .5 X3 (DISPOSABLE) IMPLANT
PATTIES SURGICAL .75X.75 (GAUZE/BANDAGES/DRESSINGS) ×2 IMPLANT
PATTIES SURGICAL 1X1 (DISPOSABLE) IMPLANT
PERFORATOR LRG  14-11MM (BIT) ×1
PERFORATOR LRG 14-11MM (BIT) ×1 IMPLANT
PIN MAYFIELD SKULL DISP (PIN) IMPLANT
PLATE 1.5  2HOLE LNG NEURO (Plate) ×2 IMPLANT
PLATE 1.5 2HOLE LNG NEURO (Plate) IMPLANT
SCREW SELF DRILL HT 1.5/4MM (Screw) ×4 IMPLANT
SPECIMEN JAR SMALL (MISCELLANEOUS) IMPLANT
SPONGE GAUZE 4X4 12PLY (GAUZE/BANDAGES/DRESSINGS) IMPLANT
SPONGE NEURO XRAY DETECT 1X3 (DISPOSABLE) IMPLANT
SPONGE SURGIFOAM ABS GEL 100 (HEMOSTASIS) ×2 IMPLANT
STAPLER SKIN PROX WIDE 3.9 (STAPLE) ×2 IMPLANT
SUT NURALON 4 0 TR CR/8 (SUTURE) ×4 IMPLANT
SUT VIC AB 2-0 OS6 18 (SUTURE) ×6 IMPLANT
SUT VIC AB 3-0 CP2 18 (SUTURE) ×1 IMPLANT
SYR 20ML ECCENTRIC (SYRINGE) ×2 IMPLANT
SYR CONTROL 10ML LL (SYRINGE) ×2 IMPLANT
TOWEL OR 17X24 6PK STRL BLUE (TOWEL DISPOSABLE) ×2 IMPLANT
TOWEL OR 17X26 10 PK STRL BLUE (TOWEL DISPOSABLE) ×2 IMPLANT
TRAY FOLEY CATH 14FRSI W/METER (CATHETERS) IMPLANT
UNDERPAD 30X30 INCONTINENT (UNDERPADS AND DIAPERS) IMPLANT
WATER STERILE IRR 1000ML POUR (IV SOLUTION) ×2 IMPLANT

## 2014-01-04 NOTE — Anesthesia Procedure Notes (Signed)
Procedure Name: Intubation Date/Time: 01/04/2014 5:42 PM Performed by: Marinda Elk A Pre-anesthesia Checklist: Patient identified, Timeout performed, Emergency Drugs available, Suction available and Patient being monitored Patient Re-evaluated:Patient Re-evaluated prior to inductionOxygen Delivery Method: Circle system utilized Preoxygenation: Pre-oxygenation with 100% oxygen Intubation Type: IV induction and Cricoid Pressure applied Ventilation: Mask ventilation without difficulty Grade View: Grade IV Tube type: Oral Tube size: 7.5 mm Number of attempts: 2 Airway Equipment and Method: Stylet and Video-laryngoscopy Placement Confirmation: ETT inserted through vocal cords under direct vision,  breath sounds checked- equal and bilateral and positive ETCO2 Secured at: 20 cm Tube secured with: Tape Dental Injury: Teeth and Oropharynx as per pre-operative assessment  Comments: Laryngoscopy with 3 mac, unable to visualize vocal cords.  Second look with video glide scope, good view

## 2014-01-04 NOTE — Transfer of Care (Signed)
Immediate Anesthesia Transfer of Care Note  Patient: Brenda [name]  Procedure(s) Performed: Procedure(s): CRANIOTOMY HEMATOMA EVACUATION SUBDURAL (Right)  Patient Location: PACU  Anesthesia Type:General  Level of Consciousness: awake, alert  and oriented  Airway & Oxygen Therapy: Patient Spontanous Breathing and Patient connected to nasal cannula oxygen  Post-op Assessment: Report given to PACU RN and Post -op Vital signs reviewed and stable  Post vital signs: Reviewed and stable  Complications: No apparent anesthesia complications

## 2014-01-04 NOTE — ED Provider Notes (Signed)
I have personally seen and examined the patient.  I have discussed the plan of care with the resident.  I have reviewed the documentation on PMH/FH/Soc. History.  I have reviewed the documentation of the resident and agree.  Pt admitted to nsgy for further management of worsening SDH Pt stable in the ED  Sharyon Cable, MD 01/04/14 0144

## 2014-01-04 NOTE — Progress Notes (Signed)
CRITICAL VALUE ALERT  Critical value received:  K: 2.9  Date of notification:  01/04/2014  Time of notification:  0125  Critical value read back:yes  Nurse who received alert:  Eliane Decree, RN   MD notified (1st page):  Dr. Ronnald Ramp  Time of first page:  0128  MD notified (2nd page):  Time of second page:  Responding MD:  Dr. Ronnald Ramp  Time MD responded:  [place] Orders received

## 2014-01-04 NOTE — Op Note (Signed)
Preop diagnosis: Right chronic subdural hematoma Postop diagnosis: Same Procedure: Right frontal parietal craniotomy for evacuation of chronic subdural hematoma Surgeon: Genesia Caslin  After being placed the supine position with the head turned towards the left side the patient's right scalp was shaved prepped and draped in usual sterile fashion. Linear incision was made in the right frontoparietal region and any bleeding controlled with unipolar coagulation. The incision was held open with a self-retaining retractor. Single burr hole was placed and a small craniotomy was performed without difficulty. The bone flap was removed. The dura was then opened in a cruciate fashion. Membranes were encountered and once opened large amounts of chronic case oil colored material was evacuated without difficulty under marked pressure. We irrigated copiously all directions until the return was clear. We scraped off of membranes in the area we had exposed. At this point the return was clear without we evacuated the hematoma. We did leave a drain in the subdural space brought out through separate stab wound incision. The bone flap was then reattached with 2 small Lorenz clamps and then the wound was closed with inverted Vicryl on the galea and staples on the skin. A sterile dressing was then applied and the patient was extubated and taken to recovery room in stable condition.

## 2014-01-04 NOTE — Progress Notes (Signed)
Patient ID: Brenda [name], female   DOB: Apr 10, 1931, 80 y.o.   MRN: [id number] Afeb, vss No new neuro issues She is awake, and alert, but a bit disoriented to events. Her CT sghows large chronic subdural with significant mass effect. Will plan crani today for drainage. Risks benefits discussed. She understands and requests that we proceed with surgery.

## 2014-01-04 NOTE — Plan of Care (Signed)
Problem: Consults Goal: Diagnosis - Craniotomy Subdural hematoma     

## 2014-01-04 NOTE — Anesthesia Postprocedure Evaluation (Signed)
  Anesthesia Post-op Note  Patient: Brenda [name]  Procedure(s) Performed: Procedure(s): CRANIOTOMY HEMATOMA EVACUATION SUBDURAL (Right)  Patient Location: PACU  Anesthesia Type:General  Level of Consciousness: Awake, alert and oriented  Airway and Oxygen Therapy: Patient Spontanous Breathing and Patient connected to nasal cannula oxygen  Post-op Pain: none  Post-op Assessment: Post-op Vital signs reviewed  Post-op Vital Signs: Reviewed  Last Vitals:  Filed Vitals:   01/04/14 1950  BP:   Pulse:   Temp: 36.9 C  Resp:     Complications: No apparent anesthesia complications

## 2014-01-04 NOTE — Anesthesia Preprocedure Evaluation (Signed)
Anesthesia Evaluation  Patient identified by MRN, date of birth, ID band Patient awake and Patient confused    Reviewed: Allergy & Precautions, H&P , NPO status , Patient's Chart, lab work & pertinent test results, reviewed documented beta blocker date and time   Airway Mallampati: II TM Distance: >3 FB Neck ROM: full    Dental   Pulmonary shortness of breath, asthma ,  breath sounds clear to auscultation        Cardiovascular hypertension, + CAD Atrial Fibrillation Rhythm:regular     Neuro/Psych  Headaches, negative psych ROS   GI/Hepatic Neg liver ROS, GERD-  Medicated and Controlled,  Endo/Other  Hypothyroidism   Renal/GU negative Renal ROS  negative genitourinary   Musculoskeletal   Abdominal   Peds  Hematology negative hematology ROS (+) Blood dyscrasia, ,   Anesthesia Other Findings See surgeon's H&P   Reproductive/Obstetrics negative OB ROS                           Anesthesia Physical Anesthesia Plan  ASA: III and emergent  Anesthesia Plan: General   Post-op Pain Management:    Induction: Intravenous  Airway Management Planned: Oral ETT  Additional Equipment:   Intra-op Plan:   Post-operative Plan: Extubation in OR  Informed Consent: I have reviewed the patients History and Physical, chart, labs and discussed the procedure including the risks, benefits and alternatives for the proposed anesthesia with the patient or authorized representative who has indicated his/her understanding and acceptance.   Dental Advisory Given  Plan Discussed with: CRNA and Surgeon  Anesthesia Plan Comments:         Anesthesia Quick Evaluation

## 2014-01-05 ENCOUNTER — Inpatient Hospital Stay (HOSPITAL_COMMUNITY): Payer: Medicare Other

## 2014-01-05 NOTE — Progress Notes (Signed)
Patient ID: Brenda [name], female   DOB: 13-Jan-1931, 80 y.o.   MRN: [id number] Afeb, vss No new neuro issues Feels much better with less headache. CT this morning shows much less subdural fluid with less shift. Drain still working, and will keep in place as long as there is still reasonable output.

## 2014-01-06 MED ORDER — BIOTENE DRY MOUTH MT LIQD
15.0000 mL | Freq: Two times a day (BID) | OROMUCOSAL | Status: DC
  Administered 2014-01-07 – 2014-01-16 (×16): 15 mL via OROMUCOSAL

## 2014-01-06 NOTE — Progress Notes (Signed)
Postop 2. Patient complains of mild headache mostly around the areas of her incisions. No other problems.  She is afebrile. Vitals are stable. She is awake and alert. Drain output lessening. Awake and alert. Oriented and appropriate. Motor and sensory function stable.  Status post burr hole back to a shim subdural hematoma. Continued subdural drain. Repeat head CT scan in morning.

## 2014-01-07 ENCOUNTER — Inpatient Hospital Stay (HOSPITAL_COMMUNITY): Payer: Medicare Other

## 2014-01-07 NOTE — Progress Notes (Signed)
Dr. Hal Neer made aware of critical CT Scan results. Thayer Ohm D
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
## 2014-01-07 NOTE — Progress Notes (Signed)
Rehab Admissions Coordinator Note:  Patient was screened by Retta Diones for appropriateness for an Inpatient Acute Rehab Consult.  At this time, we are recommending Inpatient Rehab consult.  Jodell Cipro M 01/07/2014, 10:01 AM  I can be reached at [phone].

## 2014-01-07 NOTE — Progress Notes (Signed)
Patient ID: Brenda [name], female   DOB: 07-16-1931, 80 y.o.   MRN: [id number] Afeb, vss No new neuro issues. The fluid collection has re accumulated and mass effect is increased. I have explained that to Ms Vanalstine, and I think we need to re evacuate it agin tomorrow. They understand the plan, and agree to move forward.

## 2014-01-08 ENCOUNTER — Encounter (HOSPITAL_COMMUNITY): Payer: Self-pay | Admitting: Anesthesiology

## 2014-01-08 ENCOUNTER — Encounter (HOSPITAL_COMMUNITY): Payer: Medicare Other | Admitting: Anesthesiology

## 2014-01-08 ENCOUNTER — Inpatient Hospital Stay (HOSPITAL_COMMUNITY): Payer: Medicare Other | Admitting: Anesthesiology

## 2014-01-08 ENCOUNTER — Encounter (HOSPITAL_COMMUNITY)
Admission: EM | Disposition: A | Discharge status: Rehabilitation Facility | Payer: Self-pay | Source: Home / Self Care | Attending: Neurosurgery

## 2014-01-08 HISTORY — PX: CRANIOTOMY: SHX93

## 2014-01-08 SURGERY — CRANIOTOMY HEMATOMA EVACUATION SUBDURAL
Anesthesia: General

## 2014-01-08 MED ORDER — ACETAMINOPHEN 325 MG PO TABS: 650.0000 mg | ORAL_TABLET | ORAL | Status: DC | PRN

## 2014-01-08 MED ORDER — NEOSTIGMINE METHYLSULFATE 10 MG/10ML IV SOLN
INTRAVENOUS | Status: DC | PRN
  Administered 2014-01-08: 4 mg via INTRAVENOUS

## 2014-01-08 MED ORDER — ONDANSETRON HCL 4 MG PO TABS: 4.0000 mg | ORAL_TABLET | ORAL | Status: DC | PRN

## 2014-01-08 MED ORDER — MORPHINE SULFATE 2 MG/ML IJ SOLN
1.0000 mg | INTRAMUSCULAR | Status: DC | PRN
  Administered 2014-01-08 – 2014-01-10 (×2): 2 mg via INTRAVENOUS
  Administered 2014-01-11: 1 mg via INTRAVENOUS
  Filled 2014-01-08 (×2): qty 1

## 2014-01-08 MED ORDER — 0.9 % SODIUM CHLORIDE (POUR BTL) OPTIME
TOPICAL | Status: DC | PRN
  Administered 2014-01-08 (×2): 1000 mL

## 2014-01-08 MED ORDER — FENTANYL CITRATE 0.05 MG/ML IJ SOLN
INTRAMUSCULAR | Status: DC | PRN
  Administered 2014-01-08: 100 ug via INTRAVENOUS

## 2014-01-08 MED ORDER — SODIUM CHLORIDE 0.9 % IR SOLN
Status: DC | PRN
  Administered 2014-01-08: 12:00:00

## 2014-01-08 MED ORDER — ONDANSETRON HCL 4 MG/2ML IJ SOLN
INTRAMUSCULAR | Status: DC | PRN
  Administered 2014-01-08: 4 mg via INTRAVENOUS

## 2014-01-08 MED ORDER — BACITRACIN ZINC 500 UNIT/GM EX OINT
TOPICAL_OINTMENT | CUTANEOUS | Status: DC | PRN
  Administered 2014-01-08: 1 via TOPICAL

## 2014-01-08 MED ORDER — SODIUM CHLORIDE 0.9 % IV SOLN
INTRAVENOUS | Status: DC | PRN
  Administered 2014-01-08: 11:00:00 via INTRAVENOUS

## 2014-01-08 MED ORDER — DOCUSATE SODIUM 100 MG PO CAPS
100.0000 mg | ORAL_CAPSULE | Freq: Two times a day (BID) | ORAL | Status: DC
  Administered 2014-01-08 – 2014-01-16 (×11): 100 mg via ORAL
  Filled 2014-01-08 (×17): qty 1

## 2014-01-08 MED ORDER — HEMOSTATIC AGENTS (NO CHARGE) OPTIME
TOPICAL | Status: DC | PRN
  Administered 2014-01-08: 1 via TOPICAL

## 2014-01-08 MED ORDER — HYDROCODONE-ACETAMINOPHEN 5-325 MG PO TABS
1.0000 | ORAL_TABLET | ORAL | Status: DC | PRN
Start: 2014-01-08 — End: 2014-01-08

## 2014-01-08 MED ORDER — ROCURONIUM BROMIDE 100 MG/10ML IV SOLN
INTRAVENOUS | Status: DC | PRN
  Administered 2014-01-08: 40 mg via INTRAVENOUS

## 2014-01-08 MED ORDER — LIDOCAINE HCL (CARDIAC) 20 MG/ML IV SOLN
INTRAVENOUS | Status: DC | PRN
  Administered 2014-01-08: 50 mg via INTRAVENOUS

## 2014-01-08 MED ORDER — FENTANYL CITRATE 0.05 MG/ML IJ SOLN
INTRAMUSCULAR | Status: AC
  Filled 2014-01-08: qty 5

## 2014-01-08 MED ORDER — PROPOFOL 10 MG/ML IV BOLUS
INTRAVENOUS | Status: DC | PRN
  Administered 2014-01-08: 40 mg via INTRAVENOUS
  Administered 2014-01-08: 140 mg via INTRAVENOUS

## 2014-01-08 MED ORDER — THROMBIN 5000 UNITS EX SOLR
CUTANEOUS | Status: DC | PRN
  Administered 2014-01-08 (×2): 5000 [IU] via TOPICAL

## 2014-01-08 MED ORDER — LABETALOL HCL 5 MG/ML IV SOLN: 10.0000 mg | INTRAVENOUS | Status: DC | PRN

## 2014-01-08 MED ORDER — PROPOFOL 10 MG/ML IV BOLUS
INTRAVENOUS | Status: AC
  Filled 2014-01-08: qty 20

## 2014-01-08 MED ORDER — CEFAZOLIN SODIUM-DEXTROSE 2-3 GM-% IV SOLR
INTRAVENOUS | Status: DC | PRN
  Administered 2014-01-08: 2 g via INTRAVENOUS

## 2014-01-08 MED ORDER — GLYCOPYRROLATE 0.2 MG/ML IJ SOLN
INTRAMUSCULAR | Status: DC | PRN
  Administered 2014-01-08: .7 mg via INTRAVENOUS

## 2014-01-08 MED ORDER — SODIUM CHLORIDE 0.9 % IV SOLN
500.0000 mg | Freq: Two times a day (BID) | INTRAVENOUS | Status: DC
  Filled 2014-01-08 (×2): qty 5

## 2014-01-08 MED ORDER — PROMETHAZINE HCL 25 MG PO TABS: 12.5000 mg | ORAL_TABLET | ORAL | Status: DC | PRN

## 2014-01-08 MED ORDER — CEFAZOLIN SODIUM 1-5 GM-% IV SOLN
1.0000 g | Freq: Three times a day (TID) | INTRAVENOUS | Status: AC
  Administered 2014-01-08 (×2): 1 g via INTRAVENOUS
  Filled 2014-01-08 (×2): qty 50

## 2014-01-08 MED ORDER — ACETAMINOPHEN 650 MG RE SUPP: 650.0000 mg | RECTAL | Status: DC | PRN

## 2014-01-08 MED ORDER — ONDANSETRON HCL 4 MG/2ML IJ SOLN
4.0000 mg | INTRAMUSCULAR | Status: DC | PRN
  Administered 2014-01-08: 4 mg via INTRAVENOUS

## 2014-01-08 MED ORDER — PANTOPRAZOLE SODIUM 40 MG IV SOLR
40.0000 mg | Freq: Every day | INTRAVENOUS | Status: DC
  Administered 2014-01-08 – 2014-01-10 (×3): 40 mg via INTRAVENOUS
  Filled 2014-01-08 (×3): qty 40

## 2014-01-08 SURGICAL SUPPLY — 76 items
APL SKNCLS STERI-STRIP NONHPOA (GAUZE/BANDAGES/DRESSINGS) ×1
BAG DECANTER FOR FLEXI CONT (MISCELLANEOUS) ×2 IMPLANT
BANDAGE GAUZE 4  KLING STR (GAUZE/BANDAGES/DRESSINGS) IMPLANT
BENZOIN TINCTURE PRP APPL 2/3 (GAUZE/BANDAGES/DRESSINGS) ×2 IMPLANT
BIT DRILL WIRE PASS 1.3MM (BIT) ×1 IMPLANT
BLADE 10 SAFETY STRL DISP (BLADE) ×2 IMPLANT
BLADE SURG ROTATE 9660 (MISCELLANEOUS) ×2 IMPLANT
BNDG GAUZE ELAST 4 BULKY (GAUZE/BANDAGES/DRESSINGS) IMPLANT
BRUSH SCRUB EZ 1% IODOPHOR (MISCELLANEOUS) IMPLANT
BRUSH SCRUB EZ PLAIN DRY (MISCELLANEOUS) ×2 IMPLANT
BUR ROUTER D-58 CRANI (BURR) IMPLANT
CANISTER SUCT 3000ML (MISCELLANEOUS) ×2 IMPLANT
CLIP TI MEDIUM 6 (CLIP) IMPLANT
CONT SPEC 4OZ CLIKSEAL STRL BL (MISCELLANEOUS) ×2 IMPLANT
CORDS BIPOLAR (ELECTRODE) ×2 IMPLANT
COVER TABLE BACK 60X90 (DRAPES) ×3 IMPLANT
DRAIN SNY WOU 7FLT (WOUND CARE) ×1 IMPLANT
DRAPE NEUROLOGICAL W/INCISE (DRAPES) ×2 IMPLANT
DRAPE SURG 17X23 STRL (DRAPES) IMPLANT
DRAPE WARM FLUID 44X44 (DRAPE) ×2 IMPLANT
DRILL WIRE PASS 1.3MM (BIT)
DRSG TELFA 3X8 NADH (GAUZE/BANDAGES/DRESSINGS) ×2 IMPLANT
ELECT CAUTERY BLADE 6.4 (BLADE) ×2 IMPLANT
ELECT REM PT RETURN 9FT ADLT (ELECTROSURGICAL) ×2
ELECTRODE REM PT RTRN 9FT ADLT (ELECTROSURGICAL) ×1 IMPLANT
EVACUATOR SILICONE 100CC (DRAIN) ×1 IMPLANT
GAUZE SPONGE 4X4 16PLY XRAY LF (GAUZE/BANDAGES/DRESSINGS) IMPLANT
GLOVE BIOGEL PI IND STRL 7.5 (GLOVE) IMPLANT
GLOVE BIOGEL PI INDICATOR 7.5 (GLOVE) ×1
GLOVE ECLIPSE 8.0 STRL XLNG CF (GLOVE) ×2 IMPLANT
GLOVE EXAM NITRILE LRG STRL (GLOVE) IMPLANT
GLOVE EXAM NITRILE MD LF STRL (GLOVE) ×2 IMPLANT
GLOVE EXAM NITRILE XL STR (GLOVE) IMPLANT
GLOVE EXAM NITRILE XS STR PU (GLOVE) IMPLANT
GLOVE SURG SS PI 7.0 STRL IVOR (GLOVE) ×2 IMPLANT
GOWN STRL REUS W/ TWL LRG LVL3 (GOWN DISPOSABLE) IMPLANT
GOWN STRL REUS W/ TWL XL LVL3 (GOWN DISPOSABLE) IMPLANT
GOWN STRL REUS W/TWL 2XL LVL3 (GOWN DISPOSABLE) ×1 IMPLANT
GOWN STRL REUS W/TWL LRG LVL3 (GOWN DISPOSABLE) ×4
GOWN STRL REUS W/TWL XL LVL3 (GOWN DISPOSABLE) ×2
HEMOSTAT SURGICEL 2X14 (HEMOSTASIS) ×1 IMPLANT
KIT BASIN OR (CUSTOM PROCEDURE TRAY) ×2 IMPLANT
KIT ROOM TURNOVER OR (KITS) ×2 IMPLANT
NEEDLE HYPO 22GX1.5 SAFETY (NEEDLE) ×2 IMPLANT
NS IRRIG 1000ML POUR BTL (IV SOLUTION) ×2 IMPLANT
PACK CRANIOTOMY (CUSTOM PROCEDURE TRAY) ×2 IMPLANT
PAD ARMBOARD 7.5X6 YLW CONV (MISCELLANEOUS) ×2 IMPLANT
PAD DRESSING TELFA 3X8 NADH (GAUZE/BANDAGES/DRESSINGS) ×1 IMPLANT
PAD EYE OVAL STERILE LF (GAUZE/BANDAGES/DRESSINGS) IMPLANT
PATTIES SURGICAL .25X.25 (GAUZE/BANDAGES/DRESSINGS) IMPLANT
PATTIES SURGICAL .5 X.5 (GAUZE/BANDAGES/DRESSINGS) IMPLANT
PATTIES SURGICAL .5 X3 (DISPOSABLE) IMPLANT
PATTIES SURGICAL .75X.75 (GAUZE/BANDAGES/DRESSINGS) ×2 IMPLANT
PATTIES SURGICAL 1X1 (DISPOSABLE) IMPLANT
PERFORATOR LRG  14-11MM (BIT) ×1
PERFORATOR LRG 14-11MM (BIT) ×1 IMPLANT
PIN MAYFIELD SKULL DISP (PIN) IMPLANT
PLATE 1.5  2HOLE LNG NEURO (Plate) ×1 IMPLANT
PLATE 1.5 2HOLE LNG NEURO (Plate) IMPLANT
SCREW SELF DRILL HT 1.5/4MM (Screw) ×3 IMPLANT
SPECIMEN JAR SMALL (MISCELLANEOUS) IMPLANT
SPONGE GAUZE 4X4 12PLY (GAUZE/BANDAGES/DRESSINGS) ×1 IMPLANT
SPONGE NEURO XRAY DETECT 1X3 (DISPOSABLE) IMPLANT
SPONGE SURGIFOAM ABS GEL SZ50 (HEMOSTASIS) ×1 IMPLANT
STAPLER SKIN PROX WIDE 3.9 (STAPLE) ×2 IMPLANT
SUT NURALON 4 0 TR CR/8 (SUTURE) ×4 IMPLANT
SUT VIC AB 2-0 OS6 18 (SUTURE) ×3 IMPLANT
SUT VIC AB 3-0 CP2 18 (SUTURE) ×1 IMPLANT
SYR 20ML ECCENTRIC (SYRINGE) ×2 IMPLANT
SYR CONTROL 10ML LL (SYRINGE) ×2 IMPLANT
TAPE CLOTH SURG 4X10 WHT LF (GAUZE/BANDAGES/DRESSINGS) ×1 IMPLANT
TOWEL OR 17X24 6PK STRL BLUE (TOWEL DISPOSABLE) ×2 IMPLANT
TOWEL OR 17X26 10 PK STRL BLUE (TOWEL DISPOSABLE) ×2 IMPLANT
TRAY FOLEY CATH 14FRSI W/METER (CATHETERS) IMPLANT
UNDERPAD 30X30 INCONTINENT (UNDERPADS AND DIAPERS) IMPLANT
WATER STERILE IRR 1000ML POUR (IV SOLUTION) ×2 IMPLANT

## 2014-01-08 NOTE — Clinical Documentation Improvement (Signed)
Possible Clinical Conditions? Cerebral Edema Encephalopathy Other Condition Cannot Clinically Determine   Supporting Information: Risk Factors:SDH Signs & Symptoms:Headaches post fall Diagnostics 01-03-14 Head CT IMPRESSION:Changes consistent with acute on chronic subdural with increase in size to 14 mm. There is also significant increase in the degree of midline shift from right to left from 3-14 mm. There is also blunting of the sulcal markings on the right likely related to a degree of underlying edema.  Ct Head 01-05-14 Impression: 3. Slightly improved right-to-left midline shift now measuring 1 cm. 4. Persistent cerebral edema within the right cerebral hemisphere.   Treatment:01-04-14 Procedure:  Right frontal parietal craniotomy for evacuation of chronic subdural hematoma   Thank You, Alessandra Grout, RN, BSN, CCDS, Clinical Documentation Specialist:  [phone]   [phone]=[facility]

## 2014-01-08 NOTE — Anesthesia Postprocedure Evaluation (Signed)
  Anesthesia Post-op Note  Patient: Brenda [name]  Procedure(s) Performed: Procedure(s) with comments: Redo CRANIOTOMY HEMATOMA EVACUATION SUBDURAL RIGHT (N/A) - Redo CRANIOTOMY HEMATOMA EVACUATION SUBDURAL RIGHT  Patient Location: PACU  Anesthesia Type:General  Level of Consciousness: awake  Airway and Oxygen Therapy: Patient Spontanous Breathing  Post-op Pain: mild  Post-op Assessment: Post-op Vital signs reviewed  Post-op Vital Signs: Reviewed  Last Vitals:  Filed Vitals:   01/08/14 1800  BP: 138/41  Pulse: 61  Temp:   Resp: 18    Complications: No apparent anesthesia complications

## 2014-01-08 NOTE — Progress Notes (Signed)
Patient ID: Brenda [name], female   DOB: May 19, 1931, 80 y.o.   MRN: [id number] Afeb, vss. No new neuro issues. Ready for her surgery today.

## 2014-01-08 NOTE — Anesthesia Preprocedure Evaluation (Signed)
Anesthesia Evaluation  Patient identified by MRN, date of birth, ID band Patient awake    Reviewed: Allergy & Precautions, H&P , NPO status , Patient's Chart, lab work & pertinent test results  Airway Mallampati: II      Dental   Pulmonary shortness of breath, asthma ,  breath sounds clear to auscultation        Cardiovascular hypertension, + CAD Rhythm:Regular Rate:Normal     Neuro/Psych    GI/Hepatic Neg liver ROS, GERD-  ,  Endo/Other  Hypothyroidism   Renal/GU negative Renal ROS     Musculoskeletal   Abdominal   Peds  Hematology   Anesthesia Other Findings   Reproductive/Obstetrics                           Anesthesia Physical Anesthesia Plan  ASA: III  Anesthesia Plan: General   Post-op Pain Management:    Induction: Intravenous  Airway Management Planned: Oral ETT  Additional Equipment:   Intra-op Plan:   Post-operative Plan: Possible Post-op intubation/ventilation  Informed Consent: I have reviewed the patients History and Physical, chart, labs and discussed the procedure including the risks, benefits and alternatives for the proposed anesthesia with the patient or authorized representative who has indicated his/her understanding and acceptance.   Dental advisory given  Plan Discussed with: CRNA, Anesthesiologist and Surgeon  Anesthesia Plan Comments:         Anesthesia Quick Evaluation

## 2014-01-08 NOTE — Progress Notes (Signed)
Attempted to see pt. Pt in surgery for redo craniotomy.  Please reorder OT when pt is medically ready.  Thank you                      Jinger Neighbors, OTR/L                          [phone]

## 2014-01-08 NOTE — Op Note (Signed)
Preop diagnosis: Chronic subdural hematoma, right, recurrent Postop diagnosis: Same Procedure: Redo right craniotomy for chronic subdural hematoma Surgeon: Kritzer Assistant: Vertell Limber  After being placed the supine position with the head turned towards the left the patient's scalp was prepped and draped in the usual sterile fashion. Previous incision in the right frontal parietal region was opened once again and extended more posterior and inferior. Prior to removing the previous bone flap we placed an additional bur hole further posterior and inferior to the original collection. We coagulated on the dura. We then removed the previous bone flap without difficulty. We identified the edges of the dural opening and dissected free. There was some clot in the area of the once this was removed we encountered recurrent chronic subdural fluid. We then opened up the posterior bur hole dural exposure and a cruciate fashion. We aggressively explored all directions from the larger opening and then began to irrigate the wound. We're able to get some flow from the anterior craniotomy to the posterior bur hole. We irrigated until the return was clear. We then inspected once more in all directions for any evidence of additional fluid and none could be identified. A subdural drain was left in route through a stab wound opening in the skin. The bone flap was reapplied with 3 Lorenz clamps. The was then closed with inverted Vicryl on the galea and staples on the skin. A sterile dressing was then applied and the patient was extubated and taken to recovery room in stable condition.

## 2014-01-08 NOTE — Anesthesia Procedure Notes (Signed)
Procedure Name: Intubation Date/Time: 01/08/2014 10:51 AM Performed by: Eligha Bridegroom Pre-anesthesia Checklist: Emergency Drugs available, Timeout performed, Patient being monitored, Patient identified and Suction available Patient Re-evaluated:Patient Re-evaluated prior to inductionOxygen Delivery Method: Circle system utilized Preoxygenation: Pre-oxygenation with 100% oxygen Ventilation: Mask ventilation without difficulty and Oral airway inserted - appropriate to patient size Laryngoscope Size: Mac and 3 Grade View: Grade III Tube type: Oral Tube size: 7.0 mm Number of attempts: 2 Airway Equipment and Method: Video-laryngoscopy Placement Confirmation: ETT inserted through vocal cords under direct vision,  breath sounds checked- equal and bilateral and positive ETCO2 Secured at: 22 cm Tube secured with: Tape Dental Injury: Teeth and Oropharynx as per pre-operative assessment  Difficulty Due To: Difficult Airway- due to reduced neck mobility

## 2014-01-08 NOTE — Transfer of Care (Signed)
Immediate Anesthesia Transfer of Care Note  Patient: Brenda [name]  Procedure(s) Performed: Procedure(s) with comments: Redo CRANIOTOMY HEMATOMA EVACUATION SUBDURAL RIGHT (N/A) - Redo CRANIOTOMY HEMATOMA EVACUATION SUBDURAL RIGHT  Patient Location: PACU  Anesthesia Type:General  Level of Consciousness: awake and alert   Airway & Oxygen Therapy: Patient Spontanous Breathing and Patient connected to nasal cannula oxygen  Post-op Assessment: Report given to PACU RN and Post -op Vital signs reviewed and stable  Post vital signs: Reviewed and stable  Complications: No apparent anesthesia complications

## 2014-01-09 ENCOUNTER — Inpatient Hospital Stay (HOSPITAL_COMMUNITY): Payer: Medicare Other

## 2014-01-09 MED ORDER — LEVOTHYROXINE SODIUM 75 MCG PO TABS
75.0000 ug | ORAL_TABLET | Freq: Every day | ORAL | Status: DC
  Administered 2014-01-10 – 2014-01-13 (×3): 75 ug via ORAL
  Filled 2014-01-09 (×5): qty 1

## 2014-01-09 MED ORDER — BOOST / RESOURCE BREEZE PO LIQD
1.0000 | Freq: Three times a day (TID) | ORAL | Status: DC
  Administered 2014-01-09 – 2014-01-16 (×12): 1 via ORAL
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
## 2014-01-09 NOTE — Progress Notes (Signed)
Patient ID: Brenda [name], female   DOB: 25-Jul-1931, 80 y.o.   MRN: [id number] Afeb, vss No new neuro issues. The CT today shows a loculated frontal collection that is still causing marked midline shift. I cannnot leave this untreated even though she is doing so well clinically as there is still significant midline shift.  i have spoken to her and her family, and we will reoperate tomorrow am and extend the craniotomy forward a great deal and enter the walled off area directly. They understand the plan and request that we proceed.

## 2014-01-09 NOTE — Progress Notes (Signed)
Upon entering the room, I noted that JP drain was completely out. MD Cabbell notified.

## 2014-01-10 ENCOUNTER — Inpatient Hospital Stay (HOSPITAL_COMMUNITY): Payer: Medicare Other | Admitting: Certified Registered Nurse Anesthetist

## 2014-01-10 ENCOUNTER — Encounter (HOSPITAL_COMMUNITY): Payer: Medicare Other | Admitting: Certified Registered Nurse Anesthetist

## 2014-01-10 ENCOUNTER — Encounter (HOSPITAL_COMMUNITY): Payer: Self-pay | Admitting: Certified Registered Nurse Anesthetist

## 2014-01-10 ENCOUNTER — Encounter (HOSPITAL_COMMUNITY)
Admission: EM | Disposition: A | Discharge status: Rehabilitation Facility | Payer: Self-pay | Source: Home / Self Care | Attending: Neurosurgery

## 2014-01-10 HISTORY — PX: CRANIOTOMY: SHX93

## 2014-01-10 SURGERY — CRANIOTOMY HEMATOMA EVACUATION SUBDURAL
Anesthesia: General | Laterality: Right

## 2014-01-10 MED ORDER — PANTOPRAZOLE SODIUM 40 MG IV SOLR: 40.0000 mg | Freq: Every day | INTRAVENOUS | Status: DC

## 2014-01-10 MED ORDER — THROMBIN 20000 UNITS EX SOLR
CUTANEOUS | Status: DC | PRN
  Administered 2014-01-10: 12:00:00 via TOPICAL

## 2014-01-10 MED ORDER — ONDANSETRON HCL 4 MG/2ML IJ SOLN: 4.0000 mg | INTRAMUSCULAR | Status: DC | PRN

## 2014-01-10 MED ORDER — LABETALOL HCL 5 MG/ML IV SOLN: 10.0000 mg | INTRAVENOUS | Status: DC | PRN

## 2014-01-10 MED ORDER — ARTIFICIAL TEARS OP OINT
TOPICAL_OINTMENT | OPHTHALMIC | Status: DC | PRN
  Administered 2014-01-10: 1 via OPHTHALMIC

## 2014-01-10 MED ORDER — SODIUM CHLORIDE 0.9 % IV SOLN
INTRAVENOUS | Status: DC | PRN
  Administered 2014-01-10 (×2): via INTRAVENOUS

## 2014-01-10 MED ORDER — ONDANSETRON HCL 4 MG/2ML IJ SOLN
INTRAMUSCULAR | Status: AC
  Filled 2014-01-10: qty 2

## 2014-01-10 MED ORDER — 0.9 % SODIUM CHLORIDE (POUR BTL) OPTIME
TOPICAL | Status: DC | PRN
  Administered 2014-01-10 (×3): 1000 mL

## 2014-01-10 MED ORDER — NEOSTIGMINE METHYLSULFATE 10 MG/10ML IV SOLN
INTRAVENOUS | Status: DC | PRN
  Administered 2014-01-10: 3 mg via INTRAVENOUS

## 2014-01-10 MED ORDER — PROMETHAZINE HCL 25 MG PO TABS: 12.5000 mg | ORAL_TABLET | ORAL | Status: DC | PRN

## 2014-01-10 MED ORDER — EPHEDRINE SULFATE 50 MG/ML IJ SOLN
INTRAMUSCULAR | Status: DC | PRN
  Administered 2014-01-10 (×3): 5 mg via INTRAVENOUS

## 2014-01-10 MED ORDER — ONDANSETRON HCL 4 MG/2ML IJ SOLN: 4.0000 mg | Freq: Four times a day (QID) | INTRAMUSCULAR | Status: DC | PRN

## 2014-01-10 MED ORDER — GLYCOPYRROLATE 0.2 MG/ML IJ SOLN
INTRAMUSCULAR | Status: DC | PRN
  Administered 2014-01-10: 0.4 mg via INTRAVENOUS

## 2014-01-10 MED ORDER — ONDANSETRON HCL 4 MG/2ML IJ SOLN
INTRAMUSCULAR | Status: DC | PRN
  Administered 2014-01-10: 4 mg via INTRAVENOUS

## 2014-01-10 MED ORDER — PROPOFOL 10 MG/ML IV BOLUS
INTRAVENOUS | Status: AC
  Filled 2014-01-10: qty 20

## 2014-01-10 MED ORDER — LEVETIRACETAM 500 MG PO TABS
500.0000 mg | ORAL_TABLET | Freq: Two times a day (BID) | ORAL | Status: DC
  Administered 2014-01-10 – 2014-01-11 (×3): 500 mg via ORAL
  Filled 2014-01-10 (×5): qty 1

## 2014-01-10 MED ORDER — LIDOCAINE HCL (CARDIAC) 20 MG/ML IV SOLN
INTRAVENOUS | Status: AC
  Filled 2014-01-10: qty 5

## 2014-01-10 MED ORDER — ONDANSETRON HCL 4 MG PO TABS: 4.0000 mg | ORAL_TABLET | ORAL | Status: DC | PRN

## 2014-01-10 MED ORDER — CEFAZOLIN SODIUM-DEXTROSE 2-3 GM-% IV SOLR
INTRAVENOUS | Status: AC
  Filled 2014-01-10: qty 50

## 2014-01-10 MED ORDER — FENTANYL CITRATE 0.05 MG/ML IJ SOLN
INTRAMUSCULAR | Status: AC
  Filled 2014-01-10: qty 2

## 2014-01-10 MED ORDER — ROCURONIUM BROMIDE 50 MG/5ML IV SOLN
INTRAVENOUS | Status: AC
  Filled 2014-01-10: qty 1

## 2014-01-10 MED ORDER — SUCCINYLCHOLINE CHLORIDE 20 MG/ML IJ SOLN
INTRAMUSCULAR | Status: DC | PRN
  Administered 2014-01-10: 100 mg via INTRAVENOUS

## 2014-01-10 MED ORDER — OXYCODONE HCL 5 MG PO TABS: 5.0000 mg | ORAL_TABLET | Freq: Once | ORAL | Status: DC | PRN

## 2014-01-10 MED ORDER — FENTANYL CITRATE 0.05 MG/ML IJ SOLN
25.0000 ug | INTRAMUSCULAR | Status: DC | PRN
Start: 2014-01-10 — End: 2014-01-10
  Administered 2014-01-10: 25 ug via INTRAVENOUS

## 2014-01-10 MED ORDER — ARTIFICIAL TEARS OP OINT
TOPICAL_OINTMENT | OPHTHALMIC | Status: AC
  Filled 2014-01-10: qty 3.5

## 2014-01-10 MED ORDER — SODIUM CHLORIDE 0.9 % IV SOLN: 500.0000 mg | Freq: Two times a day (BID) | INTRAVENOUS | Status: DC

## 2014-01-10 MED ORDER — PROPOFOL 10 MG/ML IV BOLUS
INTRAVENOUS | Status: DC | PRN
  Administered 2014-01-10: 120 mg via INTRAVENOUS

## 2014-01-10 MED ORDER — ROCURONIUM BROMIDE 100 MG/10ML IV SOLN
INTRAVENOUS | Status: DC | PRN
  Administered 2014-01-10: 30 mg via INTRAVENOUS

## 2014-01-10 MED ORDER — CEFAZOLIN SODIUM-DEXTROSE 2-3 GM-% IV SOLR
INTRAVENOUS | Status: DC | PRN
  Administered 2014-01-10: 2 g via INTRAVENOUS

## 2014-01-10 MED ORDER — LIDOCAINE HCL (CARDIAC) 20 MG/ML IV SOLN
INTRAVENOUS | Status: DC | PRN
  Administered 2014-01-10: 80 mg via INTRAVENOUS

## 2014-01-10 MED ORDER — FENTANYL CITRATE 0.05 MG/ML IJ SOLN
INTRAMUSCULAR | Status: DC | PRN
  Administered 2014-01-10: 50 ug via INTRAVENOUS
  Administered 2014-01-10 (×2): 25 ug via INTRAVENOUS
  Administered 2014-01-10: 100 ug via INTRAVENOUS

## 2014-01-10 MED ORDER — FENTANYL CITRATE 0.05 MG/ML IJ SOLN
INTRAMUSCULAR | Status: AC
  Filled 2014-01-10: qty 5

## 2014-01-10 MED ORDER — SODIUM CHLORIDE 0.9 % IR SOLN
Status: DC | PRN
  Administered 2014-01-10: 12:00:00

## 2014-01-10 MED ORDER — OXYCODONE HCL 5 MG/5ML PO SOLN: 5.0000 mg | Freq: Once | ORAL | Status: DC | PRN

## 2014-01-10 SURGICAL SUPPLY — 79 items
APL SKNCLS STERI-STRIP NONHPOA (GAUZE/BANDAGES/DRESSINGS) ×1
BAG DECANTER FOR FLEXI CONT (MISCELLANEOUS) ×2 IMPLANT
BANDAGE GAUZE 4  KLING STR (GAUZE/BANDAGES/DRESSINGS) IMPLANT
BENZOIN TINCTURE PRP APPL 2/3 (GAUZE/BANDAGES/DRESSINGS) ×2 IMPLANT
BIT DRILL WIRE PASS 1.3MM (BIT) ×1 IMPLANT
BLADE 10 SAFETY STRL DISP (BLADE) ×2 IMPLANT
BLADE SURG ROTATE 9660 (MISCELLANEOUS) ×2 IMPLANT
BNDG GAUZE ELAST 4 BULKY (GAUZE/BANDAGES/DRESSINGS) IMPLANT
BRUSH SCRUB EZ 1% IODOPHOR (MISCELLANEOUS) IMPLANT
BRUSH SCRUB EZ PLAIN DRY (MISCELLANEOUS) ×2 IMPLANT
BUR ROUTER D-58 CRANI (BURR) ×1 IMPLANT
CANISTER SUCT 3000ML (MISCELLANEOUS) ×2 IMPLANT
CLIP TI MEDIUM 6 (CLIP) IMPLANT
CONT SPEC 4OZ CLIKSEAL STRL BL (MISCELLANEOUS) ×2 IMPLANT
CORDS BIPOLAR (ELECTRODE) ×2 IMPLANT
COVER TABLE BACK 60X90 (DRAPES) ×4 IMPLANT
DRAIN SNY WOU 7FLT (WOUND CARE) ×1 IMPLANT
DRAPE NEUROLOGICAL W/INCISE (DRAPES) ×2 IMPLANT
DRAPE SURG 17X23 STRL (DRAPES) IMPLANT
DRAPE WARM FLUID 44X44 (DRAPE) ×2 IMPLANT
DRILL WIRE PASS 1.3MM (BIT) ×2
DRSG TELFA 3X8 NADH (GAUZE/BANDAGES/DRESSINGS) ×2 IMPLANT
ELECT CAUTERY BLADE 6.4 (BLADE) ×2 IMPLANT
ELECT REM PT RETURN 9FT ADLT (ELECTROSURGICAL) ×2
ELECTRODE REM PT RTRN 9FT ADLT (ELECTROSURGICAL) ×1 IMPLANT
EVACUATOR SILICONE 100CC (DRAIN) ×1 IMPLANT
GAUZE SPONGE 4X4 16PLY XRAY LF (GAUZE/BANDAGES/DRESSINGS) IMPLANT
GLOVE BIO SURGEON STRL SZ8.5 (GLOVE) ×1 IMPLANT
GLOVE BIOGEL PI IND STRL 7.5 (GLOVE) IMPLANT
GLOVE BIOGEL PI IND STRL 8.5 (GLOVE) IMPLANT
GLOVE BIOGEL PI INDICATOR 7.5 (GLOVE) ×1
GLOVE BIOGEL PI INDICATOR 8.5 (GLOVE) ×1
GLOVE ECLIPSE 8.0 STRL XLNG CF (GLOVE) ×2 IMPLANT
GLOVE EXAM NITRILE LRG STRL (GLOVE) IMPLANT
GLOVE EXAM NITRILE XL STR (GLOVE) IMPLANT
GLOVE EXAM NITRILE XS STR PU (GLOVE) IMPLANT
GLOVE OPTIFIT SS 8.0 STRL (GLOVE) ×1 IMPLANT
GOWN STRL REUS W/ TWL LRG LVL3 (GOWN DISPOSABLE) IMPLANT
GOWN STRL REUS W/ TWL XL LVL3 (GOWN DISPOSABLE) IMPLANT
GOWN STRL REUS W/TWL 2XL LVL3 (GOWN DISPOSABLE) ×2 IMPLANT
GOWN STRL REUS W/TWL LRG LVL3 (GOWN DISPOSABLE) ×2
GOWN STRL REUS W/TWL XL LVL3 (GOWN DISPOSABLE) ×4
HEMOSTAT SURGICEL 2X14 (HEMOSTASIS) ×2 IMPLANT
HOOK DURA (MISCELLANEOUS) ×1 IMPLANT
KIT BASIN OR (CUSTOM PROCEDURE TRAY) ×2 IMPLANT
KIT REMOVER STAPLE SKIN (MISCELLANEOUS) ×1 IMPLANT
KIT ROOM TURNOVER OR (KITS) ×2 IMPLANT
NEEDLE HYPO 22GX1.5 SAFETY (NEEDLE) ×2 IMPLANT
NS IRRIG 1000ML POUR BTL (IV SOLUTION) ×2 IMPLANT
PACK CRANIOTOMY (CUSTOM PROCEDURE TRAY) ×2 IMPLANT
PAD ARMBOARD 7.5X6 YLW CONV (MISCELLANEOUS) ×2 IMPLANT
PAD DRESSING TELFA 3X8 NADH (GAUZE/BANDAGES/DRESSINGS) ×1 IMPLANT
PAD EYE OVAL STERILE LF (GAUZE/BANDAGES/DRESSINGS) IMPLANT
PATTIES SURGICAL .25X.25 (GAUZE/BANDAGES/DRESSINGS) IMPLANT
PATTIES SURGICAL .5 X.5 (GAUZE/BANDAGES/DRESSINGS) IMPLANT
PATTIES SURGICAL .5 X3 (DISPOSABLE) IMPLANT
PATTIES SURGICAL .75X.75 (GAUZE/BANDAGES/DRESSINGS) ×2 IMPLANT
PATTIES SURGICAL 1X1 (DISPOSABLE) IMPLANT
PERFORATOR LRG  14-11MM (BIT) ×1
PERFORATOR LRG 14-11MM (BIT) ×1 IMPLANT
PIN MAYFIELD SKULL DISP (PIN) IMPLANT
PLATE 1.5  2HOLE LNG NEURO (Plate) ×3 IMPLANT
PLATE 1.5 2HOLE LNG NEURO (Plate) IMPLANT
SCREW SELF DRILL HT 1.5/4MM (Screw) ×4 IMPLANT
SPECIMEN JAR SMALL (MISCELLANEOUS) IMPLANT
SPONGE GAUZE 4X4 12PLY (GAUZE/BANDAGES/DRESSINGS) IMPLANT
SPONGE NEURO XRAY DETECT 1X3 (DISPOSABLE) IMPLANT
SPONGE SURGIFOAM ABS GEL 100 (HEMOSTASIS) ×2 IMPLANT
STAPLER SKIN PROX WIDE 3.9 (STAPLE) ×2 IMPLANT
SUT NURALON 4 0 TR CR/8 (SUTURE) ×4 IMPLANT
SUT VIC AB 2-0 OS6 18 (SUTURE) ×6 IMPLANT
SUT VIC AB 3-0 CP2 18 (SUTURE) ×2 IMPLANT
SYR 20ML ECCENTRIC (SYRINGE) ×2 IMPLANT
SYR CONTROL 10ML LL (SYRINGE) ×2 IMPLANT
TOWEL OR 17X24 6PK STRL BLUE (TOWEL DISPOSABLE) ×2 IMPLANT
TOWEL OR 17X26 10 PK STRL BLUE (TOWEL DISPOSABLE) ×2 IMPLANT
TRAY FOLEY CATH 14FRSI W/METER (CATHETERS) IMPLANT
UNDERPAD 30X30 INCONTINENT (UNDERPADS AND DIAPERS) IMPLANT
WATER STERILE IRR 1000ML POUR (IV SOLUTION) ×2 IMPLANT

## 2014-01-10 NOTE — Transfer of Care (Signed)
Immediate Anesthesia Transfer of Care Note  Patient: Brenda [name]  Procedure(s) Performed: Procedure(s): Redo CRANIOTOMY HEMATOMA EVACUATION SUBDURAL (Right)  Patient Location: PACU  Anesthesia Type:General  Level of Consciousness: awake, alert  and oriented  Airway & Oxygen Therapy: Patient Spontanous Breathing and Patient connected to nasal cannula oxygen  Post-op Assessment: Report given to PACU RN, Post -op Vital signs reviewed and stable, Patient moving all extremities X 4 and Patient able to stick tongue midline  Post vital signs: Reviewed and stable  Complications: No apparent anesthesia complications

## 2014-01-10 NOTE — Progress Notes (Signed)
Bedside hand off given to CRNA Elmo Putt in Neuro pre-op area.
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
## 2014-01-10 NOTE — Anesthesia Procedure Notes (Signed)
Procedure Name: Intubation Date/Time: 01/10/2014 11:05 AM Performed by: [place] Pink Pre-anesthesia Checklist: Patient identified, Emergency Drugs available, Suction available, Patient being monitored and Timeout performed Patient Re-evaluated:Patient Re-evaluated prior to inductionOxygen Delivery Method: Circle system utilized Preoxygenation: Pre-oxygenation with 100% oxygen Intubation Type: IV induction and Rapid sequence Grade View: Grade I Tube type: Subglottic suction tube Tube size: 7.0 mm Number of attempts: 1 Airway Equipment and Method: Video-laryngoscopy and Stylet Placement Confirmation: ETT inserted through vocal cords under direct vision,  positive ETCO2 and breath sounds checked- equal and bilateral Secured at: 20 cm Tube secured with: Tape Dental Injury: Teeth and Oropharynx as per pre-operative assessment  Comments: Glidescope utilized due to prior difficulty  Right front tooth remains extremely loose as pre-op, left front tooth chipped as pre-op

## 2014-01-10 NOTE — Anesthesia Postprocedure Evaluation (Signed)
Anesthesia Post Note  Patient: [name]  Procedure(s) Performed: Procedure(s) (LRB): Redo CRANIOTOMY HEMATOMA EVACUATION SUBDURAL (Right)  Anesthesia type: General  Patient location: PACU  Post pain: Pain level controlled and Adequate analgesia  Post assessment: Post-op Vital signs reviewed, Patient's Cardiovascular Status Stable, Respiratory Function Stable, Patent Airway and Pain level controlled  Last Vitals:  Filed Vitals:   01/10/14 1500  BP: 125/56  Pulse: 77  Temp:   Resp: 15    Post vital signs: Reviewed and stable  Level of consciousness: awake, alert  and oriented  Complications: No apparent anesthesia complications

## 2014-01-10 NOTE — Op Note (Signed)
Preop diagnosis: Right chronic subdural hematoma, residual Postop diagnosis: Same Procedure: Right frontal parietotemporal craniotomy for evacuation of chronic subdural hematoma Surgeon: Kritzer Assistant: Arnoldo Morale  After and placed in the supine position and turned towards the left the patient's right scalp was shaved prepped and draped in usual sterile fashion. Previous scalp incision was opened and extended posteriorly and inferiorly and then anteriorly towards the hairline. The flap was reflected inferiorly. We identified the edges of her previous craniotomy and removed that bone flap. We then used a high-speed oscillating saw opened up a larger bony flap and the anterior and inferior direction. We then opened up the dura and encountered very thickened subdural membranes. We went through the membranes and encountered chronic subdural hematoma fluid. We then removed the membranes in all directions as much as possible being careful not to cause any bleeding which was successfully done. We irrigated all directions until the return was clear. We inspected once more in all directions and found no evidence of residual hematoma. We loosely closed the dura and approximated the edges once again. We connected the 2 small cranial flaps to each other and then reattach them to the skull with 4 Lorenz clamps. We left a subgaleal drain in the subgaleal space and brought out through a separate stab wound incision. We then closed the galea with inverted Vicryl and staples on the skin. A sterile dressing was then applied and the patient was extubated and taken to recovery room in stable condition.
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
## 2014-01-11 ENCOUNTER — Other Ambulatory Visit: Payer: Self-pay

## 2014-01-11 ENCOUNTER — Inpatient Hospital Stay (HOSPITAL_COMMUNITY): Payer: Medicare Other

## 2014-01-11 LAB — POCT I-STAT 4, (NA,K, GLUC, HGB,HCT)
Glucose, Bld: 124 mg/dL — ABNORMAL HIGH (ref 70–99)
HCT: 29 % — ABNORMAL LOW (ref 36.0–46.0)
Hemoglobin: 9.9 g/dL — ABNORMAL LOW (ref 12.0–15.0)
Potassium: 3.4 mEq/L — ABNORMAL LOW (ref 3.7–5.3)
SODIUM: 139 meq/L (ref 137–147)

## 2014-01-11 MED ORDER — PANTOPRAZOLE SODIUM 40 MG PO TBEC
40.0000 mg | DELAYED_RELEASE_TABLET | Freq: Every day | ORAL | Status: DC
  Administered 2014-01-11 – 2014-01-15 (×4): 40 mg via ORAL
  Filled 2014-01-11 (×4): qty 1

## 2014-01-11 MED ORDER — AMLODIPINE BESYLATE 5 MG PO TABS: 5.0000 mg | ORAL_TABLET | Freq: Every day | ORAL | Status: DC
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
## 2014-01-11 NOTE — Progress Notes (Signed)
Pt seen and examined. No issues overnight. Pt has minimal HA this am. No c/o visual changes or new weakness  EXAM: Temp:  [97.9 F (36.6 C)-100.2 F (37.9 C)] 98.3 F (36.8 C) (06/19 0826) Pulse Rate:  [50-85] 56 (06/19 1200) Resp:  [14-22] 19 (06/19 1200) BP: (101-142)/(38-106) 129/54 mmHg (06/19 1200) SpO2:  [88 %-100 %] 96 % (06/19 1200) Intake/Output     06/18 0701 - 06/19 0700 06/19 0701 - 06/20 0700   P.O. 237 120   I.V. (mL/kg) 2800 (42) 375 (5.6)   Other 325    IV Piggyback     Total Intake(mL/kg) 3362 (50.4) 495 (7.4)   Urine (mL/kg/hr) 975 (0.6) 250 (0.7)   Drains 20 (0)    Blood 100 (0.1)    Total Output 1095 250   Net +2267 +245         Awake, alert CN grossly intact Good strength x decreased left grip Wound dressed, JP in place - 20cc x 24hrs  LABS: Lab Results  Component Value Date   CREATININE 0.84 01/04/2014   BUN 11 01/04/2014   NA 139 01/10/2014   K 3.4* 01/10/2014   CL 97 01/04/2014   CO2 26 01/04/2014   Lab Results  Component Value Date   WBC 9.6 01/04/2014   HGB 9.9* 01/10/2014   HCT 29.0* 01/10/2014   MCV 88.1 01/04/2014   PLT 345 01/04/2014    IMAGING: Repeat CTH demonstrates reduction in right convexity chronic SDH with postsurgical hemorrhage underneath craniotomy. Improvement in mass effect and MLS.  IMPRESSION: - 80 y.o. female POD#1 s/p repeat crani for chronic SDH, doing well  PLAN: - Cont routine postop care - d/c drain tomorrow - OOB with PT/OT

## 2014-01-12 DIAGNOSIS — I6200 Nontraumatic subdural hemorrhage, unspecified: Secondary | ICD-10-CM

## 2014-01-12 MED ORDER — SODIUM CHLORIDE 0.9 % IV SOLN
1000.0000 mg | Freq: Once | INTRAVENOUS | Status: AC
  Administered 2014-01-12: 1000 mg via INTRAVENOUS
  Filled 2014-01-12: qty 20

## 2014-01-12 MED ORDER — LEVETIRACETAM 500 MG/5ML IV SOLN
1000.0000 mg | Freq: Two times a day (BID) | INTRAVENOUS | Status: DC
  Administered 2014-01-12 – 2014-01-14 (×6): 1000 mg via INTRAVENOUS
  Filled 2014-01-12 (×8): qty 10

## 2014-01-12 MED ORDER — LORAZEPAM 2 MG/ML IJ SOLN
2.0000 mg | Freq: Four times a day (QID) | INTRAMUSCULAR | Status: DC | PRN
  Administered 2014-01-12: 2 mg via INTRAVENOUS
  Filled 2014-01-12: qty 1

## 2014-01-12 MED ORDER — PHENYTOIN SODIUM 50 MG/ML IJ SOLN
100.0000 mg | Freq: Three times a day (TID) | INTRAMUSCULAR | Status: DC
  Administered 2014-01-12 – 2014-01-16 (×12): 100 mg via INTRAVENOUS
  Filled 2014-01-12 (×13): qty 2

## 2014-01-12 MED ORDER — LEVETIRACETAM 500 MG PO TABS
1000.0000 mg | ORAL_TABLET | Freq: Two times a day (BID) | ORAL | Status: DC
  Filled 2014-01-12 (×2): qty 2
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
## 2014-01-12 NOTE — Progress Notes (Signed)
Pt seen and examined. No issues overnight, however this am pt had 1-2 min GTC SZ, Back to baseline immediately afterward. No current c/o HA, visual changes.  EXAM: Temp:  [97.3 F (36.3 C)-98.1 F (36.7 C)] 97.3 F (36.3 C) (06/20 0447) Pulse Rate:  [52-94] 58 (06/20 0800) Resp:  [14-24] 15 (06/20 0800) BP: (106-167)/(37-106) 133/54 mmHg (06/20 0800) SpO2:  [86 %-100 %] 97 % (06/20 0800) Intake/Output     06/19 0701 - 06/20 0700 06/20 0701 - 06/21 0700   P.O. 457    I.V. (mL/kg) 1725 (25.9) 75 (1.1)   Other     Total Intake(mL/kg) 2182 (32.7) 75 (1.1)   Urine (mL/kg/hr) 1000 (0.6)    Drains 30 (0)    Blood     Total Output 1030     Net +1152 +75        Urine Occurrence 5 x     Awake, alert CN grossly intact Good strength, x slight left hand weakness and ?RLE drift Wound c/d/i  LABS: Lab Results  Component Value Date   CREATININE 0.84 01/04/2014   BUN 11 01/04/2014   NA 139 01/10/2014   K 3.4* 01/10/2014   CL 97 01/04/2014   CO2 26 01/04/2014   Lab Results  Component Value Date   WBC 9.6 01/04/2014   HGB 9.9* 01/10/2014   HCT 29.0* 01/10/2014   MCV 88.1 01/04/2014   PLT 345 01/04/2014    IMPRESSION: - 80 y.o. female POD# 2 s/p repeat crani for chronic SDH with postop SZ, neurologically at baseline  PLAN: - Keppra increased to 1000mg  BID - PRN ativan - Cont to observe in ICU with new SZ - JP d/c'ed - SLP eval

## 2014-01-12 NOTE — Progress Notes (Signed)
Patient had a clonic/tonic seizure lasted approximately three minutes. Husband at bedside. Dr. Kathyrn Sheriff contacted. Pt. Oriented times four after seizure activity, following commands, and moving all extremities, with pupils slightly unequal at 5 on left and 4.5 on right. Will continue to monitor.

## 2014-01-12 NOTE — Progress Notes (Signed)
Nurse tech. Was assisting patient with a bath and developed seizure activity. Spastic activity was less than prior 1600 seizure, but lasted approximately one minute again. Patient was given Ativan 2 mg given intravenously per order for seizure activity. Patient became lethargic, oriented to person, but able to follow simple commands. Dr. Kathyrn Sheriff contacting concerning second seizure, who asked for Neurology to be paged for a consult. Will continue to monitor patient.

## 2014-01-12 NOTE — Progress Notes (Signed)
Secretary alerted staff to check on patient due to artifact from EEG. Upon entering room, patient was actively have clonic/tonic seizure activity, not interactive, oxygen saturations dropping in the 80's but breathing. Patient not able to follow commands, with eyes deviated downward. Seizure activity lasted approximately one minute. Patient shortly returned to baseline. Dr. Kathyrn Sheriff contacted concerning seizure. Orders received to give Dilantin 1 gm IV loading dose and then 100 mg every eight hours. Will continue to monitor.

## 2014-01-12 NOTE — Progress Notes (Signed)
Paged MD due to pt's bladder scan = [phone]. Dr. Kathyrn Sheriff returned page and gave orders to I&O cath pt.

## 2014-01-13 ENCOUNTER — Encounter (HOSPITAL_COMMUNITY): Payer: Self-pay | Admitting: Neurosurgery

## 2014-01-13 ENCOUNTER — Inpatient Hospital Stay (HOSPITAL_COMMUNITY): Payer: Medicare Other

## 2014-01-13 LAB — PHENYTOIN LEVEL, TOTAL: Phenytoin Lvl: 13.5 ug/mL (ref 10.0–20.0)

## 2014-01-13 MED ORDER — BIOTENE DRY MOUTH MT LIQD: 15.0000 mL | Freq: Two times a day (BID) | OROMUCOSAL | Status: DC

## 2014-01-13 MED ORDER — LEVOTHYROXINE SODIUM 100 MCG IV SOLR
37.5000 ug | Freq: Every day | INTRAVENOUS | Status: DC
  Administered 2014-01-14 – 2014-01-16 (×3): 37.5 ug via INTRAVENOUS
  Filled 2014-01-13 (×4): qty 5

## 2014-01-13 MED ORDER — CHLORHEXIDINE GLUCONATE 0.12 % MT SOLN
15.0000 mL | Freq: Two times a day (BID) | OROMUCOSAL | Status: DC
  Administered 2014-01-13 – 2014-01-16 (×6): 15 mL via OROMUCOSAL
  Filled 2014-01-13 (×6): qty 15

## 2014-01-13 MED ORDER — LEVOTHYROXINE SODIUM 100 MCG IV SOLR
37.2000 ug | Freq: Every day | INTRAVENOUS | Status: DC
  Filled 2014-01-13: qty 5
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
## 2014-01-13 NOTE — Progress Notes (Signed)
Pt seen and examined. Pt had multiple GTC SZ yesterday, and had intermittent episodes of confusion/disorientation last night. Currently no complaints, denying HA, visual changes or weakness.  EXAM: Temp:  [97.8 F (36.6 C)-98.7 F (37.1 C)] 98.6 F (37 C) (06/21 0757) Pulse Rate:  [56-93] 74 (06/21 0600) Resp:  [15-26] 17 (06/21 0600) BP: (93-169)/(46-115) 127/59 mmHg (06/21 0600) SpO2:  [79 %-99 %] 91 % (06/21 0600) Intake/Output     06/20 0701 - 06/21 0700 06/21 0701 - 06/22 0700   P.O.     I.V. (mL/kg) 1650 (24.7)    IV Piggyback 350    Total Intake(mL/kg) 2000 (30)    Urine (mL/kg/hr) 350 (0.2)    Drains 5 (0)    Total Output 355     Net +1645          Urine Occurrence 4 x     Awake, alert, oriented to person, hospital, year CN grossly intact Good strength, no drift throughout Wound c/d/i  LABS: Lab Results  Component Value Date   CREATININE 0.84 01/04/2014   BUN 11 01/04/2014   NA 139 01/10/2014   K 3.4* 01/10/2014   CL 97 01/04/2014   CO2 26 01/04/2014   Lab Results  Component Value Date   WBC 9.6 01/04/2014   HGB 9.9* 01/10/2014   HCT 29.0* 01/10/2014   MCV 88.1 01/04/2014   PLT 345 01/04/2014   Total PHT - 13.5  IMPRESSION: - 80 y.o. female POD# 4 s/p redo crani for chronic SDH with postop SZ - Episodic confusion with multiple GTC SZ raises possibility of subclinical SZ  PLAN: - Cont Keppra, Dilantin - Appreciate neurology assistance. Plan on EEG LTR when possible.

## 2014-01-13 NOTE — Progress Notes (Signed)
Paged Neuro Sx due to pt being more difficult to arouse.   Pt was orientated X4, but is now orientated X2 = to person and time.   Dr. Kathyrn Sheriff returned call and said that her confusion could be due to her seizure activity.   No orders were given and I am continuing to monitor.

## 2014-01-13 NOTE — Progress Notes (Signed)
Pt failed MBS test, SLP recommendation for strict NPO. Will repeat MBS on Tuesday. Dr. Kathyrn Sheriff notified. Pharmacy changed synthroid to IV. MD declined to place NG/start TF at this time. Will monitor.

## 2014-01-13 NOTE — Progress Notes (Signed)
NEURO HOSPITALIST PROGRESS NOTE   SUBJECTIVE:                                                                                                                        No further clinical seizures reported. She was more difficult to arouse around 4 am today but at this moment she is alert, awake, oriented, able to engage in a normal conversation. Speech is better. On IV keppra 1 gram BID and IV dilantin 300 mg respectively. Dilantin level 13.5   OBJECTIVE:                                                                                                                           Vital signs in last 24 hours: Temp:  [97.8 F (36.6 C)-98.7 F (37.1 C)] 98.6 F (37 C) (06/21 0757) Pulse Rate:  [56-93] 74 (06/21 0600) Resp:  [15-26] 17 (06/21 0600) BP: (93-169)/(46-115) 127/59 mmHg (06/21 0600) SpO2:  [79 %-99 %] 91 % (06/21 0600)  Intake/Output from previous day: 06/20 0701 - 06/21 0700 In: 2000 [I.V.:1650; IV Piggyback:350] Out: 355 [Urine:350; Drains:5] Intake/Output this shift:   Nutritional status: NPO  Past Medical History  Diagnosis Date  . Hypertension   . Hyperlipidemia   . GERD (gastroesophageal reflux disease)   . Osteoarthritis   . Sarcoidosis   . Goiter   . Sicca   . Anxiety   . IBS (irritable bowel syndrome)   . Hemorrhoids   . DJD (degenerative joint disease)   . Hx of colonic polyps   . History of colonoscopy   . Asthma   . Barrett's esophagus   . History of shingles     face/eye    Neurologic Exam:  Mental Status:  Alert, awake, oriented to place-year-month-day. No dysarthria or dysphasia. Able to follow simple step commands without difficulty.  Cranial Nerves:  II: Discs flat bilaterally; Visual fields grossly normal, pupils equal, round, reactive to light and accommodation  III,IV, VI: ptosis not present, extra-ocular motions intact bilaterally  V,VII: smile asymmetric due to left face weakness, facial light  touch sensation normal bilaterally  VIII: hearing normal bilaterally  IX,X: gag reflex present  XI: bilateral shoulder shrug no tested  XII: midline tongue extension without  atrophy or fasciculations  Motor:  Significant for mild left arm weakness Tone and bulk:normal tone throughout; no atrophy noted  Sensory: no tested  Deep Tendon Reflexes:  1+ all over  Plantars:  No tested  Cerebellar and Gait:  Unable to test at this moment   Lab Results: No results found for this basename: cbc, bmp, coags, chol, tri, ldl, hga1c   Lipid Panel No results found for this basename: CHOL, TRIG, HDL, CHOLHDL, VLDL, LDLCALC,  in the last 72 hours  Studies/Results: No results found.  MEDICATIONS                                                                                                                        Scheduled: . antiseptic oral rinse  15 mL Mouth Rinse BID  . docusate sodium  100 mg Oral BID  . feeding supplement (RESOURCE BREEZE)  1 Container Oral TID BM  . levETIRAcetam  1,000 mg Intravenous Q12H  . levothyroxine  75 mcg Oral QAC breakfast  . pantoprazole  40 mg Oral QHS  . phenytoin (DILANTIN) IV  100 mg Intravenous 3 times per day    ASSESSMENT/PLAN:                                                                                                           New onset symptomatic GTC seizures in the context of TBI with right SDH s/p resection x 3. Doing better this morning. Can not entirely exclude the possibility of intermittent subclinical seizures. Unfortunately, c-EEG monitoring no feasible on the weekends. Continue current dose keppra and dilantin. Will follow up.   Dorian Pod, MD [facility] [phone]  01/13/2014, 8:47 AM

## 2014-01-14 ENCOUNTER — Inpatient Hospital Stay (HOSPITAL_COMMUNITY): Payer: Medicare Other

## 2014-01-14 LAB — PHENYTOIN LEVEL, TOTAL: PHENYTOIN LVL: 14.4 ug/mL (ref 10.0–20.0)

## 2014-01-14 NOTE — Progress Notes (Signed)
An inpt rehab consult has been recommended by therapy. Please order a consult wo that we can assess for potential inpt rehab admission when medically ready. [phone]

## 2014-01-14 NOTE — Plan of Care (Signed)
Problem: Consults Goal: Diagnosis - Craniotomy Outcome: Completed/Met Date Met:  01/14/14 Subdural hematoma

## 2014-01-14 NOTE — Progress Notes (Signed)
EEG completed; results pending.    
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
## 2014-01-14 NOTE — Progress Notes (Signed)
OT Cancellation Note  Patient Details Name: Brenda [name] MRN: [id number] DOB: October 22, 1930   Cancelled Treatment:    Reason Eval/Treat Not Completed: Patient at procedure or test/ unavailable (EEG, will try back as schedule allows.)  [name] 01/14/2014, 9:02 AM

## 2014-01-14 NOTE — Progress Notes (Signed)
UR completed.  Michelle Bryson, RN BSN MHA CCM Trauma/Neuro ICU Case Manager [phone]  

## 2014-01-14 NOTE — Progress Notes (Signed)
PT Cancellation Note  Patient Details Name: Brenda [name] MRN: [id number] DOB: 03/19/1931   Cancelled Treatment:    Reason Eval/Treat Not Completed: Patient at procedure or test/unavailable (EEG, will follow up as time permits)   Duncan Dull 01/14/2014, 9:44 AM Alben Deeds, PT DPT  [phone]

## 2014-01-14 NOTE — Procedures (Signed)
ELECTROENCEPHALOGRAM REPORT  Date of Study: 01/14/2014  Patient's Name: Brenda [name] MRN: [id number] Date of Birth: April 18, 1931  Referring Provider: Dr. Dorian Pod  Clinical History: This is an 80 year old woman with a right subdural hematoma status post craniotomy and evacuation with confusion  Medications: Keppra, Dilantin, Norco, Phenergan, labetalol  Technical Summary: A multichannel digital EEG recording measured by the international 10-20 system with electrodes applied with paste and impedances below 5000 ohms performed in our laboratory with EKG monitoring in an awake and drowsy patient.  Hyperventilation and photic stimulation were not performed.  The digital EEG was referentially recorded, reformatted, and digitally filtered in a variety of bipolar and referential montages for optimal display.  Spike detection software was employed.  Description: The patient is awake and drowsy during the recording. She is noted to answer orientation questions correctly but slightly confused.  During maximal wakefulness, there is a symmetric, medium voltage 9-10 Hz posterior dominant rhythm that attenuates with eye opening.  The record is symmetric.  During drowsiness, there is an increase in theta slowing of the background.  Hyperventilation and photic stimulation were not performed. There were no epileptiform discharges or electrographic seizures seen.    EKG lead was unremarkable.  Impression: This awake and drowsy EEG is normal.    Clinical Correlation: A normal EEG does not exclude a clinical diagnosis of epilepsy.  Clinical correlation is advised.   Ellouise Newer, M.D.
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
## 2014-01-15 DIAGNOSIS — S069X9A Unspecified intracranial injury with loss of consciousness of unspecified duration, initial encounter: Secondary | ICD-10-CM

## 2014-01-15 DIAGNOSIS — S069XAA Unspecified intracranial injury with loss of consciousness status unknown, initial encounter: Secondary | ICD-10-CM

## 2014-01-15 LAB — PHENYTOIN LEVEL, TOTAL: Phenytoin Lvl: 15.1 ug/mL (ref 10.0–20.0)

## 2014-01-15 MED ORDER — AMLODIPINE BESYLATE 5 MG PO TABS
5.0000 mg | ORAL_TABLET | Freq: Every day | ORAL | Status: DC
  Administered 2014-01-15 – 2014-01-16 (×2): 5 mg via ORAL
  Filled 2014-01-15 (×2): qty 1

## 2014-01-15 MED ORDER — LEVETIRACETAM 100 MG/ML PO SOLN
1000.0000 mg | Freq: Two times a day (BID) | ORAL | Status: DC
  Administered 2014-01-15 – 2014-01-16 (×3): 1000 mg via ORAL
  Filled 2014-01-15 (×2): qty 10

## 2014-01-15 NOTE — Progress Notes (Signed)
Rehab admissions - I met with pt and her family in follow up to rehab MD consult. Pt's husband, two sisters and brother-in-law were present during my discussion. Pt is wanting to get home but understands she still needs further therapy. Pt/family agree that CIR would be a good idea. Further questions were answered and informational brochures were given.  I will follow pt's case and consider possible admit to CIR pending her medical clearance and our bed availability. I updated Hassan Rowan, Tourist information centre manager as well. Please call me with any questions. Thanks.  Nanetta Batty, PT Rehabilitation Admissions Coordinator [phone]
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
## 2014-01-15 NOTE — Plan of Care (Signed)
Problem: Phase II Progression Outcomes Goal: Discharge plan established Outcome: Progressing Consult for Rehab

## 2014-01-15 NOTE — Progress Notes (Signed)
Pt arrived on the unit via wheelchair with belongings and spouse at side at 1050; Pt A&O x4; pt oriented to the room, unit and call light. Pt IV intact; staples to head intact with surgical cap on; pt pain assessed. Pt transferred to bed; foley remains intact; pt in bed comfortably with call light within reach. Pt family and spouse remains at bedside. Pt remained stable through shift with no acute distress or discomfort voiced or noted. Pt denies any pain. Report given off to incoming RN. Delia Heady RN
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
## 2014-01-15 NOTE — Significant Event (Signed)
Patient transferred to 4N-20, report called to receiving RN.  Patients husband aware of transfer and at bedside.  Patient Alert and oriented all personal belongings and meds gathered and transferred with patient.  VSS prior to transfer BP 125/56  Pulse 84  Temp(Src) 99.4 F (37.4 C) (Oral)  Resp 18  Ht 5' 3.5" (1.613 m)  Wt 66.7 kg (147 lb 0.8 oz)  BMI 25.64 kg/m2  SpO2 93% Upon admission to unit.  Patient left with bedrails up X3, and call bell within reach.  Instructed patient to utilize call bell, bed alarm turned on.

## 2014-01-15 NOTE — Progress Notes (Signed)
Pt seen and examined. No issues overnight. Pt had modified barium swallow yesterday. No complaints currently. No SZ.  EXAM: Temp:  [97.3 F (36.3 C)-99.9 F (37.7 C)] 97.3 F (36.3 C) (06/23 0855) Pulse Rate:  [65-90] 65 (06/23 0700) Resp:  [14-24] 19 (06/23 0700) BP: (112-158)/(41-88) 118/47 mmHg (06/23 0700) SpO2:  [92 %-99 %] 99 % (06/23 0700) Intake/Output     06/22 0701 - 06/23 0700 06/23 0701 - 06/24 0700   P.O. 237    I.V. (mL/kg) 1800 (27)    IV Piggyback 220    Total Intake(mL/kg) 2257 (33.8)    Urine (mL/kg/hr) 1850 (1.2)    Total Output 1850     Net +407           Awake, alert, oriented Speech fluent Good strength throughout Wound c/d/i  LABS: Lab Results  Component Value Date   CREATININE 0.84 01/04/2014   BUN 11 01/04/2014   NA 139 01/10/2014   K 3.4* 01/10/2014   CL 97 01/04/2014   CO2 26 01/04/2014   Lab Results  Component Value Date   WBC 9.6 01/04/2014   HGB 9.9* 01/10/2014   HCT 29.0* 01/10/2014   MCV 88.1 01/04/2014   PLT 345 01/04/2014   EEG: normal  IMPRESSION: - 80 y.o. female s/p crani for chronic SDH with postop SZ - SZ free for >48 hrs - Dysphagia improving  PLAN: - Transfer to floow - Cont Keppra/Dilantin - PM&R consult for CIR
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
## 2014-01-16 ENCOUNTER — Inpatient Hospital Stay (HOSPITAL_COMMUNITY)
Admission: AD | Admit: 2014-01-16 | Discharge: 2014-01-22 | DRG: 945 | Disposition: A | Discharge status: Home Health | Payer: Medicare Other | Source: Intra-hospital | Attending: Physical Medicine & Rehabilitation | Admitting: Physical Medicine & Rehabilitation

## 2014-01-16 DIAGNOSIS — S065XAA Traumatic subdural hemorrhage with loss of consciousness status unknown, initial encounter: Secondary | ICD-10-CM | POA: Diagnosis present

## 2014-01-16 DIAGNOSIS — S069X9A Unspecified intracranial injury with loss of consciousness of unspecified duration, initial encounter: Secondary | ICD-10-CM

## 2014-01-16 DIAGNOSIS — Z5189 Encounter for other specified aftercare: Secondary | ICD-10-CM | POA: Diagnosis not present

## 2014-01-16 DIAGNOSIS — E039 Hypothyroidism, unspecified: Secondary | ICD-10-CM | POA: Diagnosis present

## 2014-01-16 DIAGNOSIS — S069XAA Unspecified intracranial injury with loss of consciousness status unknown, initial encounter: Secondary | ICD-10-CM | POA: Diagnosis not present

## 2014-01-16 DIAGNOSIS — S065X9A Traumatic subdural hemorrhage with loss of consciousness of unspecified duration, initial encounter: Secondary | ICD-10-CM | POA: Diagnosis present

## 2014-01-16 DIAGNOSIS — W19XXXA Unspecified fall, initial encounter: Secondary | ICD-10-CM | POA: Diagnosis not present

## 2014-01-16 DIAGNOSIS — I10 Essential (primary) hypertension: Secondary | ICD-10-CM | POA: Diagnosis not present

## 2014-01-16 DIAGNOSIS — R110 Nausea: Secondary | ICD-10-CM | POA: Diagnosis present

## 2014-01-16 DIAGNOSIS — K219 Gastro-esophageal reflux disease without esophagitis: Secondary | ICD-10-CM | POA: Diagnosis present

## 2014-01-16 DIAGNOSIS — R1310 Dysphagia, unspecified: Secondary | ICD-10-CM | POA: Diagnosis present

## 2014-01-16 DIAGNOSIS — F411 Generalized anxiety disorder: Secondary | ICD-10-CM | POA: Diagnosis present

## 2014-01-16 DIAGNOSIS — J45909 Unspecified asthma, uncomplicated: Secondary | ICD-10-CM | POA: Diagnosis present

## 2014-01-16 DIAGNOSIS — Z79899 Other long term (current) drug therapy: Secondary | ICD-10-CM

## 2014-01-16 DIAGNOSIS — E785 Hyperlipidemia, unspecified: Secondary | ICD-10-CM | POA: Diagnosis present

## 2014-01-16 DIAGNOSIS — I2510 Atherosclerotic heart disease of native coronary artery without angina pectoris: Secondary | ICD-10-CM

## 2014-01-16 DIAGNOSIS — D869 Sarcoidosis, unspecified: Secondary | ICD-10-CM | POA: Diagnosis not present

## 2014-01-16 LAB — URINALYSIS, ROUTINE W REFLEX MICROSCOPIC
Bilirubin Urine: NEGATIVE
Glucose, UA: NEGATIVE mg/dL
Ketones, ur: NEGATIVE mg/dL
Nitrite: POSITIVE — AB
PROTEIN: 30 mg/dL — AB
Specific Gravity, Urine: 1.017 (ref 1.005–1.030)
UROBILINOGEN UA: 1 mg/dL (ref 0.0–1.0)
pH: 6.5 (ref 5.0–8.0)

## 2014-01-16 LAB — URINE MICROSCOPIC-ADD ON

## 2014-01-16 MED ORDER — LEVETIRACETAM 100 MG/ML PO SOLN
1000.0000 mg | Freq: Two times a day (BID) | ORAL | Status: DC
  Administered 2014-01-16 – 2014-01-22 (×12): 1000 mg via ORAL
  Filled 2014-01-16 (×15): qty 10

## 2014-01-16 MED ORDER — LEVETIRACETAM 100 MG/ML PO SOLN: 1000.0000 mg | Freq: Two times a day (BID) | ORAL | Status: DC

## 2014-01-16 MED ORDER — CHLORHEXIDINE GLUCONATE 0.12 % MT SOLN
15.0000 mL | Freq: Two times a day (BID) | OROMUCOSAL | Status: DC
  Administered 2014-01-17 – 2014-01-21 (×9): 15 mL via OROMUCOSAL
  Filled 2014-01-16 (×14): qty 15

## 2014-01-16 MED ORDER — PANTOPRAZOLE SODIUM 40 MG PO TBEC
40.0000 mg | DELAYED_RELEASE_TABLET | Freq: Every day | ORAL | Status: DC
  Administered 2014-01-16 – 2014-01-21 (×6): 40 mg via ORAL
  Filled 2014-01-16 (×6): qty 1

## 2014-01-16 MED ORDER — DOCUSATE SODIUM 100 MG PO CAPS
100.0000 mg | ORAL_CAPSULE | Freq: Two times a day (BID) | ORAL | Status: DC
  Administered 2014-01-17 – 2014-01-22 (×11): 100 mg via ORAL
  Filled 2014-01-16 (×15): qty 1

## 2014-01-16 MED ORDER — SORBITOL 70 % SOLN
30.0000 mL | Freq: Every day | Status: DC | PRN
  Administered 2014-01-19 – 2014-01-21 (×3): 30 mL via ORAL
  Filled 2014-01-16 (×3): qty 30

## 2014-01-16 MED ORDER — LEVOTHYROXINE SODIUM 100 MCG IV SOLR
37.5000 ug | Freq: Every day | INTRAVENOUS | Status: DC
  Filled 2014-01-16 (×2): qty 5

## 2014-01-16 MED ORDER — PHENYTOIN SODIUM 50 MG/ML IJ SOLN
100.0000 mg | Freq: Three times a day (TID) | INTRAMUSCULAR | Status: DC
  Filled 2014-01-16 (×3): qty 2

## 2014-01-16 MED ORDER — PHENYTOIN SODIUM EXTENDED 100 MG PO CAPS
100.0000 mg | ORAL_CAPSULE | Freq: Three times a day (TID) | ORAL | Status: DC
  Administered 2014-01-17 – 2014-01-22 (×16): 100 mg via ORAL
  Filled 2014-01-16 (×20): qty 1

## 2014-01-16 MED ORDER — ACETAMINOPHEN 650 MG RE SUPP
650.0000 mg | RECTAL | Status: DC | PRN
Start: 2014-01-16 — End: 2014-01-22

## 2014-01-16 MED ORDER — ACETAMINOPHEN 325 MG PO TABS
650.0000 mg | ORAL_TABLET | ORAL | Status: DC | PRN
Start: 2014-01-16 — End: 2014-01-22
  Administered 2014-01-18 – 2014-01-22 (×5): 650 mg via ORAL
  Filled 2014-01-16 (×5): qty 2

## 2014-01-16 MED ORDER — BOOST / RESOURCE BREEZE PO LIQD
1.0000 | Freq: Three times a day (TID) | ORAL | Status: DC
  Administered 2014-01-16 – 2014-01-22 (×12): 1 via ORAL

## 2014-01-16 MED ORDER — BIOTENE DRY MOUTH MT LIQD
15.0000 mL | Freq: Two times a day (BID) | OROMUCOSAL | Status: DC
  Administered 2014-01-16 – 2014-01-21 (×9): 15 mL via OROMUCOSAL

## 2014-01-16 MED ORDER — AMLODIPINE BESYLATE 5 MG PO TABS
5.0000 mg | ORAL_TABLET | Freq: Every day | ORAL | Status: DC
  Administered 2014-01-17 – 2014-01-22 (×6): 5 mg via ORAL
  Filled 2014-01-16 (×7): qty 1

## 2014-01-16 MED ORDER — HYDROCODONE-ACETAMINOPHEN 5-325 MG PO TABS
1.0000 | ORAL_TABLET | ORAL | Status: DC | PRN
  Administered 2014-01-16 – 2014-01-20 (×4): 1 via ORAL
  Filled 2014-01-16 (×4): qty 1

## 2014-01-16 MED ORDER — PHENYTOIN SODIUM EXTENDED 100 MG PO CAPS: 100.0000 mg | ORAL_CAPSULE | Freq: Three times a day (TID) | ORAL | Status: DC

## 2014-01-16 NOTE — Progress Notes (Signed)
Physical Therapy Treatment Patient Details Name: Brenda [name] MRN: [id number] DOB: Feb 11, 1931 Today's Date: 01/16/2014    History of Present Illness pt presents with R Subdural Hematoma s/p R Frontoparietal Crani and Evacuation 6/12, 6/16, and 01/10/14.  Original injury from [place] in May with L rib fxs.      PT Comments    Pt slowly progressing with mobility. Requires max cues to stay on task due to decreased attention span. Pt unable to recall previous therapy sessions and repeated throughout session she had not been OOB during hospital stay. Pt continues to require 2 person (A) for mobility and max cues for safety. Continues to be great candidate for CIR. Pt very motivated to return home with family.   Follow Up Recommendations  CIR     Equipment Recommendations  None recommended by PT    Recommendations for Other Services Rehab consult     Precautions / Restrictions Precautions Precautions: Fall Restrictions Weight Bearing Restrictions: No    Mobility  Bed Mobility Overal bed mobility: Needs Assistance Bed Mobility: Supine to Sit     Supine to sit: Min assist;HOB elevated     General bed mobility comments: pt with difficulty sequencing/ motor planning; max cues for proper technique; use of draw pad to bring hips to EOB; pt with heavy lean Lt sitting EOB  Transfers Overall transfer level: Needs assistance Equipment used: 2 person hand held assist;Rolling walker (2 wheeled) Transfers: Sit to/from Stand Sit to Stand: Max assist;+2 physical assistance         General transfer comment: initially performed sit to stand with 2 person (A) without RW; pt unable to acheive upright posture and was fearful; max cues for safety; pt c/o bil knee pain and required return to EOB; with RW and 2 person (A) pt able to acheive upright standing position with cues through scapulae; max cues for safety; pt with heavy lean to Lt   Ambulation/Gait Ambulation/Gait assistance: +2 physical  assistance;Mod assist Ambulation Distance (Feet): 30 Feet Assistive device: Rolling walker (2 wheeled) Gait Pattern/deviations: Step-to pattern;Decreased stride length;Antalgic;Narrow base of support;Trunk flexed Gait velocity: very decreased Gait velocity interpretation: <1.8 ft/sec, indicative of risk for recurrent falls General Gait Details: pt required max facilitation through hips to weighshift and step; 2 person (A) for safety and to maintain balance with gt; pt c/o bil knee pain throughout session; pt was able to ambulate with RW with (A) to manage around obstacles and cues to stay within RW for safety    Stairs            Wheelchair Mobility    Modified Rankin (Stroke Patients Only)       Balance Overall balance assessment: Needs assistance Sitting-balance support: Feet supported;Bilateral upper extremity supported Sitting balance-Leahy Scale: Poor Sitting balance - Comments: left lateral lean; corrected to upright position and pt able to maintain 30 seconds before returning to Lt Lateral lean; denied any dizziness; tolerated sitting EOB ~8 min                             Cognition Arousal/Alertness: Awake/alert Behavior During Therapy: WFL for tasks assessed/performed Overall Cognitive Status: Impaired/Different from baseline Area of Impairment: Attention;Memory;Safety/judgement;Problem solving   Current Attention Level: Selective Memory: Decreased short-term memory Following Commands: Follows one step commands consistently;Follows one step commands with increased time Safety/Judgement: Decreased awareness of safety;Decreased awareness of deficits   Problem Solving: Slow processing;Requires verbal cues;Requires tactile cues;Difficulty sequencing General Comments:  pt repeating herself and stated she could go home; believed this was her first time working with therapy     Exercises General Exercises - Lower Extremity Ankle Circles/Pumps: AROM;Both;10  reps;Supine Long Arc Quad: AROM;Strengthening;Both;10 reps;Seated Heel Slides: AROM;Strengthening;Both;10 reps;Supine Hip Flexion/Marching: AROM;15 reps;Strengthening;Both;Seated (cues for all exercises to stay on task) Other Exercises Other Exercises: pt easily distractable with exercises; max cues throughout to stay on task and perform     General Comments        Pertinent Vitals/Pain C/o HA intermittently and bil knee pain; Lt > Rt knee pain; patient repositioned for comfort     Home Living                      Prior Function            PT Goals (current goals can now be found in the care plan section) Acute Rehab PT Goals Patient Stated Goal: to go home PT Goal Formulation: With patient Time For Goal Achievement: 01/21/14 Potential to Achieve Goals: Good Progress towards PT goals: Progressing toward goals    Frequency  Min 4X/week    PT Plan Current plan remains appropriate    Co-evaluation             End of Session Equipment Utilized During Treatment: Gait belt;Oxygen Activity Tolerance: Patient tolerated treatment well Patient left: in chair;with call bell/phone within reach;with chair alarm set;with family/visitor present     Time: 0998-3382 PT Time Calculation (min): 24 min  Charges:  $Gait Training: 8-22 mins $Therapeutic Exercise: 8-22 mins                    G Codes:      Elie Confer [employer], [place]  [phone] 01/16/2014, 2:45 PM

## 2014-01-16 NOTE — Progress Notes (Signed)
No issues overnight. Pt doing well, no SZ. No complaints  EXAM:  BP 112/62  Pulse 76  Temp(Src) 98.3 F (36.8 C) (Oral)  Resp 18  Ht 5' 3.5" (1.613 m)  Wt 66.7 kg (147 lb 0.8 oz)  BMI 25.64 kg/m2  SpO2 95%  Awake, alert, oriented  Speech fluent, appropriate  CN grossly intact  Good strength  IMPRESSION:  80 y.o. female s/p cranis for SDH and postop SZ - SZ free for 4 days - Medically stable for tx to CIR  PLAN: - Transfer to CIR - Cont Keppra/PHT

## 2014-01-16 NOTE — Progress Notes (Signed)
Patient's foley cath noted to be leaking during rounding this morning. Spoke with Dr. Kathyrn Sheriff and he ordered to d/c foley and follow protocol. Order carried out.

## 2014-01-16 NOTE — H&P (Signed)
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 spillage to valleculae;Reduced tongue base retraction;Penetration/Aspiration during swallow;Penetration/Aspiration after swallow;Trace aspiration Penetration/Aspiration details (thin teaspoon): Material enters airway, passes BELOW cords without attempt by patient to eject out (silent aspiration) Pharyngeal - Thin Cup: Reduced pharyngeal peristalsis;Delayed swallow initiation;Reduced laryngeal elevation;Reduced anterior laryngeal mobility;Reduced epiglottic inversion;Reduced tongue base retraction;Reduced airway/laryngeal closure;Penetration/Aspiration during swallow;Penetration/Aspiration after swallow Penetration/Aspiration details (thin cup): Material enters airway, passes BELOW cords without attempt by patient to eject out (silent aspiration) Pharyngeal - Solids Pharyngeal - Puree: Reduced pharyngeal peristalsis;Reduced tongue base retraction;Reduced epiglottic inversion;Reduced laryngeal elevation;Reduced airway/laryngeal closure;Reduced anterior laryngeal mobility;Penetration/Aspiration during swallow;Trace aspiration;Pharyngeal residue - valleculae Penetration/Aspiration details (puree): Material enters airway, CONTACTS cords and not ejected out  Cervical Esophageal Phase GO Cervical Esophageal Phase Cervical Esophageal Phase: Impaired Cervical Esophageal Phase - Nectar Nectar Cup: Reduced cricopharyngeal relaxation Cervical Esophageal Phase - Thin Thin Cup: Reduced cricopharyngeal relaxation Cervical Esophageal Phase - Solids Puree: Reduced cricopharyngeal relaxation Sharman Crate [place], CCC-SLP [phone] [facility] 01/13/2014, 3:05 PM   Medical Problem List and Plan:  1. Functional deficits secondary to right subdural hematoma status post craniotomy 01/04/2014 with recurrent SDH and repeat craniotomy x2  2. DVT Prophylaxis/Anticoagulation: SCDs. Monitor for any signs of DVT  3. Pain Management: Hydrocodone as needed. Monitor with increased mobility  4. seizure prophylaxis. Keppra 1000 mg every 12 hours, Dilantin 100 mg every 8 hours. EEG negative  5. Neuropsych: This patient is not capable of making decisions on her own behalf.  6. Dysphagia. Dysphagia 3 nectar liquids. Monitor for any signs of aspiration. Followup speech therapy  7. Hypothyroidism. Synthroid    Post Admission Physician Evaluation:  1. Functional deficits secondary to right SDH s/p craniotomy with recurrent SDH and crani X2. 2. Patient is admitted to receive collaborative, interdisciplinary care between the physiatrist, rehab nursing staff, and therapy team. 3. Patient's level of medical complexity and substantial therapy needs in context of that medical necessity cannot be provided at a lesser intensity of care such as a SNF. 4. Patient has experienced substantial functional loss from his/her baseline which was documented above under the "Functional History" and "Functional Status" headings. Judging by the patient's diagnosis, physical exam, and functional history, the patient has potential for functional progress which will result in measurable gains while on inpatient rehab. These gains will be of substantial and practical use upon discharge in facilitating mobility and  self-care at the household level. 5. Physiatrist will provide 24 hour management of medical needs as well as oversight of the therapy plan/treatment and provide guidance as appropriate regarding the interaction of the two. 6. 24 hour rehab nursing will assist with bladder management, bowel management, safety, skin/wound care, disease management, medication administration, pain management and patient education and help integrate therapy concepts, techniques,education, etc. 7. PT will assess and treat for/with: Lower extremity strength, range of motion, stamina, balance, functional mobility, safety, adaptive techniques and equipment, NMR, cognitive perceptual awareness,  visual-spatial awareness, BI education, caregiver ed. Goals are: supervision to mod I. 8. OT will assess and treat for/with: ADL's, functional mobility, safety, upper extremity strength, adaptive techniques and equipment, NMR, cognitive perceptual awareness, caregiver ed, visual perceptual awareness. Goals are: supervision to mod I. 9. SLP will assess and treat for/with: speech, swallowing, cognition, BI ed. Goals are: supervision to min assist. 10. Case Management and Social Worker will assess and treat for psychological issues and discharge planning. 11. Team conference will be held weekly to assess progress toward goals and to determine barriers to discharge. 12. Patient will receive at least 3 hours of therapy per day at least 5 days per week. 13. ELOS: 12-16 days  14. Prognosis: excellent  Meredith Staggers, MD, [facility]   01/15/2014

## 2014-01-16 NOTE — Discharge Summary (Signed)
Physician Discharge Summary  Patient ID: Brenda [name] MRN: [id number] DOB/AGE: 1931-05-31 80 y.o.  Admit date: 01/03/2014 Discharge date: 01/16/2014  Admission Diagnoses: Chronic subdural hematoma  Discharge Diagnoses: Same Active Problems:   Subdural hematoma   Discharged Condition: Stable  Hospital Course:  Mrs. Brenda [name] is a 80 y.o. female Initially admitted after elective craniotomy for evacuation of subdural hematoma.  Postoperatively, CT scans demonstrated residual subdural and therefore she was taken for repeat craniotomy and evacuation.  Her postoperative ICU course was complicated by postoperative seizures, as well as dysphagia.  Seizures were controlled with Keppra and Dilantin, and EEG did not demonstrate any further seizure activity.  Her dysphagia eventually improved, and she was placed on a dysphagia diet.She was subsequently transferred to the general neurosciences floor where she continued to recover.  She was seen by physical and occupational therapy as well as physical medicine and rehabilitation consult, and was found to be a good candidate for comprehensive inpatient rehabilitation.  Treatments: Surgery : On 01/04/2014 - Right craniotomy for evacuation of subdural hematoma On 01/08/2014 - Redo right craniotomy for evacuation of subdural hematoma On 01/10/2014 - Right frontoparietal craniotomy for evacuation of subdural hematoma  Discharge Exam: Blood pressure 112/62, pulse 76, temperature 98.3 F (36.8 C), temperature source Oral, resp. rate 18, height 5' 3.5" (1.613 m), weight 66.7 kg (147 lb 0.8 oz), SpO2 95.00%. Awake, alert, oriented Speech fluent, appropriate CN grossly intact 5/5 BUE/BLE Wound c/d/i  Follow-up: Follow-up in Dr. Sande Rives office [facility] [phone]) in 2 weeks  Disposition: CIR     Medication List         ALPRAZolam 0.5 MG tablet  Commonly known as:  XANAX  Take 0.5-1.5 mg by mouth 3 (three)  times daily. Take 1 tablet twice a day and take 3 tablets at bedtime     ALREX 0.2 % Susp  Generic drug:  loteprednol  Place 1 drop into both eyes 2 (two) times daily.     amLODipine 5 MG tablet  Commonly known as:  NORVASC  Take 1 tablet (5 mg total) by mouth daily.     Biotin 5000 MCG Tabs  Take 5,000 mcg by mouth daily.     cycloSPORINE 0.05 % ophthalmic emulsion  Commonly known as:  RESTASIS  Place 1 drop into both eyes every 12 (twelve) hours.     diphenhydrAMINE 25 mg capsule  Commonly known as:  BENADRYL  Take 25 mg by mouth 2 (two) times daily. Take every day per patient     Fish Oil 1200 MG Caps  Take 1,200 mg by mouth daily.     glucosamine-chondroitin 500-400 MG tablet  Take 1 tablet by mouth 3 (three) times daily.     HYDROcodone-acetaminophen 5-325 MG per tablet  Commonly known as:  NORCO/VICODIN  Take 0.5-1 tablets by mouth every 4 (four) hours as needed for moderate pain.     levETIRAcetam 100 MG/ML solution  Commonly known as:  KEPPRA  Take 10 mLs (1,000 mg total) by mouth 2 (two) times daily.     levothyroxine 75 MCG tablet  Commonly known as:  SYNTHROID, LEVOTHROID  Take 75 mcg by mouth daily.     multivitamin capsule  Take 1 capsule by mouth daily.     pantoprazole 40 MG tablet  Commonly known as:  PROTONIX  Take 40 mg by mouth daily.     phenytoin 100 MG ER capsule  Commonly known as:  DILANTIN  Take 1 capsule (  100 mg total) by mouth 3 (three) times daily.     pravastatin 80 MG tablet  Commonly known as:  PRAVACHOL  Take 80 mg by mouth daily.     senna 8.6 MG tablet  Commonly known as:  SENOKOT  Take 3 tablets by mouth at bedtime.     tizanidine 2 MG capsule  Commonly known as:  ZANAFLEX  Take 2 mg by mouth 2 (two) times daily.     triamterene-hydrochlorothiazide 75-50 MG per tablet  Commonly known as:  MAXZIDE  Take 1 tablet by mouth daily.     TYLENOL ARTHRITIS EXT RELIEF PO  Take 2 tablets by mouth every 12 (twelve) hours as  needed (pain/fever).     Vitamin D3 1000 UNITS Caps  Take 1,000 Units by mouth daily.         SignedConsuella Lose, C 01/16/2014, 11:54 AM

## 2014-01-16 NOTE — Progress Notes (Signed)
Patient is discharged from room 4N20 and transferred to unit 4W10 at this time. Report given to nurse Anissa. IV site d/c'd. Patient transferred via bed with husband and belongings at side.

## 2014-01-16 NOTE — Progress Notes (Signed)
Rehab admissions - I spoke with Dr. Kathyrn Sheriff by phone this am and received medical clearance to bring pt to inpatient rehab. Bed is available and will admit pt to CIR later today. I completed admission paperwork with pt and her husband and answered further questions.  I updated Hassan Rowan, case Warehouse manager as well. Please call me with any questions. Thanks.  Nanetta Batty, PT Rehabilitation Admissions Coordinator [phone]

## 2014-01-16 NOTE — Progress Notes (Signed)
Foley cath d/c'd per order and urinalysis sent due to strong urine odor. Will monitor for retention.

## 2014-01-17 ENCOUNTER — Inpatient Hospital Stay (HOSPITAL_COMMUNITY): Payer: Medicare Other

## 2014-01-17 ENCOUNTER — Inpatient Hospital Stay (HOSPITAL_COMMUNITY): Payer: Medicare Other | Admitting: Speech Pathology

## 2014-01-17 ENCOUNTER — Inpatient Hospital Stay (HOSPITAL_COMMUNITY): Payer: Medicare Other | Admitting: *Deleted

## 2014-01-17 LAB — CBC WITH DIFFERENTIAL/PLATELET
BASOS ABS: 0 10*3/uL (ref 0.0–0.1)
Basophils Relative: 0 % (ref 0–1)
EOS ABS: 0.2 10*3/uL (ref 0.0–0.7)
Eosinophils Relative: 3 % (ref 0–5)
HEMATOCRIT: 26 % — AB (ref 36.0–46.0)
Hemoglobin: 8.5 g/dL — ABNORMAL LOW (ref 12.0–15.0)
Lymphocytes Relative: 19 % (ref 12–46)
Lymphs Abs: 1.3 10*3/uL (ref 0.7–4.0)
MCH: 29.4 pg (ref 26.0–34.0)
MCHC: 32.7 g/dL (ref 30.0–36.0)
MCV: 90 fL (ref 78.0–100.0)
Monocytes Absolute: 0.6 10*3/uL (ref 0.1–1.0)
Monocytes Relative: 8 % (ref 3–12)
Neutro Abs: 4.9 10*3/uL (ref 1.7–7.7)
Neutrophils Relative %: 70 % (ref 43–77)
PLATELETS: 245 10*3/uL (ref 150–400)
RBC: 2.89 MIL/uL — ABNORMAL LOW (ref 3.87–5.11)
RDW: 13.9 % (ref 11.5–15.5)
WBC: 7 10*3/uL (ref 4.0–10.5)

## 2014-01-17 LAB — COMPREHENSIVE METABOLIC PANEL
ALT: 14 U/L (ref 0–35)
AST: 18 U/L (ref 0–37)
Albumin: 2.2 g/dL — ABNORMAL LOW (ref 3.5–5.2)
Alkaline Phosphatase: 69 U/L (ref 39–117)
BUN: 9 mg/dL (ref 6–23)
CALCIUM: 6.3 mg/dL — AB (ref 8.4–10.5)
CO2: 24 mEq/L (ref 19–32)
Chloride: 104 mEq/L (ref 96–112)
Creatinine, Ser: 0.56 mg/dL (ref 0.50–1.10)
GFR calc non Af Amer: 86 mL/min — ABNORMAL LOW (ref >90)
Glucose, Bld: 96 mg/dL (ref 70–99)
Potassium: 3.1 mEq/L — ABNORMAL LOW (ref 3.7–5.3)
SODIUM: 143 meq/L (ref 137–147)
Total Bilirubin: <0.2 mg/dL — ABNORMAL LOW (ref 0.3–1.2)
Total Protein: 5.9 g/dL — ABNORMAL LOW (ref 6.0–8.3)

## 2014-01-17 MED ORDER — STARCH (THICKENING) PO POWD
ORAL | Status: DC | PRN
  Filled 2014-01-17: qty 227

## 2014-01-17 MED ORDER — LIDOCAINE 5 % EX PTCH
2.0000 | MEDICATED_PATCH | CUTANEOUS | Status: DC
  Administered 2014-01-17 – 2014-01-22 (×6): 2 via TRANSDERMAL
  Filled 2014-01-17 (×7): qty 2

## 2014-01-17 MED ORDER — LEVOTHYROXINE SODIUM 75 MCG PO TABS
75.0000 ug | ORAL_TABLET | Freq: Every day | ORAL | Status: DC
  Administered 2014-01-17 – 2014-01-22 (×6): 75 ug via ORAL
  Filled 2014-01-17 (×7): qty 1
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
## 2014-01-17 NOTE — Progress Notes (Signed)
  [facility]     PROGRESS NOTE    Subjective/Complaints: Rib cage sore. Anxious about therapies today. Was able to sleep last night  Objective: Vital Signs: Blood pressure 132/54, pulse 74, temperature 97 F (36.1 C), temperature source Oral, resp. rate 18, height 5\' 3"  (1.6 m), weight 69.673 kg (153 lb 9.6 oz), SpO2 98.00%. No results found.  Recent Labs  01/17/14 0450  WBC 7.0  HGB 8.5*  HCT 26.0*  PLT 245    Recent Labs  01/17/14 0450  NA 143  K 3.1*  CL 104  GLUCOSE 96  BUN 9  CREATININE 0.56  CALCIUM 6.3*   CBG (last 3)  No results found for this basename: GLUCAP,  in the last 72 hours  Wt Readings from Last 3 Encounters:  01/16/14 69.673 kg (153 lb 9.6 oz)  01/06/14 66.7 kg (147 lb 0.8 oz)  01/06/14 66.7 kg (147 lb 0.8 oz)    Physical Exam:  Constitutional: She appears well-developed and well-nourished.  HENT: oral mucosa generally pink and moist Craniotomy sites clean and dry  Eyes:  Pupils round and reactive to light without nystagmus  Neck: Normal range of motion. Neck supple. No thyromegaly present.  Cardiovascular: Normal rate and regular rhythm. Exam reveals no friction rub.  No murmur heard.  Respiratory: Effort normal and breath sounds normal. No respiratory distress. She has no wheezes. She has no rales.  GI: Soft. Bowel sounds are normal. She exhibits no distension. There is no tenderness.  Neurological: She is alert.  Patient is oriented to place, age and date of birth, todays date. She follows simple commands. Makes good eye contact with examiner. Borderline insight and awareness. Left facial weakness noted. Mild left inattention. Strength 4/5 RUE 4- LUE. RLE: 3+ to 4/5 proximal to distal. LLE 3- to 4-/5 prox to distal. Sensation grossly = to LT/PP left to right.  Musc: some tenderness along left knee with palpation. Chest wall also tender on left. Psychiatric: pt is pleasant, a little anxious Skin: incision  clean with staples   Assessment/Plan: 1. Functional deficits secondary to right subdural hemorrhage which require 3+ hours per day of interdisciplinary therapy in a comprehensive inpatient rehab setting. Physiatrist is providing close team supervision and 24 hour management of active medical problems listed below. Physiatrist and rehab team continue to assess barriers to discharge/monitor patient progress toward functional and medical goals. FIM:                                  Medical Problem List and Plan:  1. Functional deficits secondary to right subdural hematoma status post craniotomy 01/04/2014 with recurrent SDH and repeat craniotomy x2  2. DVT Prophylaxis/Anticoagulation: SCDs. Monitor for any signs of DVT  3. Pain Management: Hydrocodone as needed. Add lidoderm patches for chest wall pain 4. seizure prophylaxis. Keppra 1000 mg every 12 hours, Dilantin 100 mg every 8 hours. EEG negative  5. Neuropsych: This patient is not capable of making decisions on her own behalf.  6. Dysphagia. Dysphagia 3 nectar liquids. Monitor for any signs of aspiration. Followup speech therapy  7. Hypothyroidism. Synthroid  LOS (Days) 1 A FACE TO FACE EVALUATION WAS PERFORMED  SWARTZ,[name] 01/17/2014 8:28 AM
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
## 2014-01-17 NOTE — Progress Notes (Signed)
Critical calcium level called in. Linna Hoff PA notified

## 2014-01-17 NOTE — Evaluation (Signed)
Evaluation note reviewed and this therapist agrees with the information provided.  

## 2014-01-17 NOTE — Progress Notes (Signed)
Occupational Therapy Note  Patient Details  Name: Brenda [name] MRN: [id number] Date of Birth: 1930-08-25 Today's Date: 01/17/2014  Time: 1100-1155 Pt c/o sore left rib cage and soreness on top of left foot; unrated; repostioned Individual Therapy  Pt sitting in w/c upon arrival with husband present.  Pt initially reluctant to participate in therapy and asked frequently if she could get back into bed stating that she was tired.  Pt agreed to go to therapy gym.  Pt engaged in dynamic standing activity at hi-lo table.  Pt participated in game of checkers while standing.  Pt stood X 4 (10 mins, 8 mins, 5 mins X 2).  Pt required mod A for sit<>stand X 1, and min A X 3.  Pt asked repeatedly which color her checkers were.  Pt oriented X 4 and correctly named her 10 grandchildren.  Focus on activity tolerance, sit<>stand, dynamic standing balance, orientation, and safety awareness.   Leotis Shames [facility] 01/17/2014, 12:07 PM

## 2014-01-17 NOTE — Progress Notes (Signed)
Occupational Therapy Session Note  Patient Details  Name: Brenda [name] MRN: [id number] Date of Birth: 12-Jun-1931  Today's Date: 01/17/2014 Time: 1300-1330 Time Calculation (min): 30 min  Short Term Goals: Week 1:  OT Short Term Goal 1 (Week 1): Pt will complete toilet transfer with min assist and min cues OT Short Term Goal 2 (Week 1): Pt will complete LB dressing with min assist  OT Short Term Goal 3 (Week 1): Pt will attend to self-care task ~20 minutes with min cues  OT Short Term Goal 4 (Week 1): Pt will maintain dynamic standing balance ~5 minutes during self-care task with mod assist  OT Short Term Goal 5 (Week 1): Pt will demonstrate recall/carry over of functional transfers with min cues   Skilled Therapeutic Interventions/Progress Updates: ADL-retraining with focus on functional transfser, LB dressing, and sustained attention.   Patient received in her w/c requesting bed rest.  Patient redirected to attend to ADL prior to rest by donning shoes, ambulating to edge of bed, and then preforming seated foot care (applying lotion to both feet).   Patient complied with directions with mod verbal cues to redirect and sustain attention to task, also reinforced by her husband, present in room.   Patient demo'd mild confusion when donning shoes, removing socks prior donning shoes, which she reported was her habit, although this was contradicted by her husband.  Patient demo'd moderately weak right leg compared to left and was unable to cross her right leg over her left while applying lotion.     Therapy Documentation Precautions:  Precautions Precautions: Fall Restrictions Weight Bearing Restrictions: No  Vital Signs: Therapy Vitals Temp: 98.2 F (36.8 C) Temp src: Oral Pulse Rate: 75 Resp: 18 BP: 139/58 mmHg Patient Position (if appropriate): Lying Oxygen Therapy SpO2: 95 % O2 Device: None (Room air)  Pain: No/denies   See FIM for current functional status  Therapy/Group:  Individual Therapy  [place] 01/17/2014, 3:46 PM
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
## 2014-01-17 NOTE — Progress Notes (Signed)
Inpatient [facility] Individual Statement of Services  Patient Name:  Brenda [name]  Date:  01/17/2014  Welcome to the [facility].  Our goal is to provide you with an individualized program based on your diagnosis and situation, designed to meet your specific needs.  With this comprehensive rehabilitation program, you will be expected to participate in at least 3 hours of rehabilitation therapies Monday-Friday, with modified therapy programming on the weekends.  Your rehabilitation program will include the following services:  Physical Therapy (PT), Occupational Therapy (OT), Speech Therapy (ST), 24 hour per day rehabilitation nursing, Therapeutic Recreation (TR), Case Management (Social Worker), Rehabilitation Medicine, Nutrition Services and Pharmacy Services  Weekly team conferences will be held on Tuesdays to discuss your progress.  Your Social Worker will talk with you frequently to get your input and to update you on team discussions.  Team conferences with you and your family in attendance may also be held.  Expected length of stay:  12-14 days  Overall anticipated outcome:  Supervision  Depending on your progress and recovery, your program may change. Your Social Worker will coordinate services and will keep you informed of any changes. Your Social Worker's name and contact numbers are listed  below.  The following services may also be recommended but are not provided by the [facility] will be made to provide these services after discharge if needed.  Arrangements include referral to agencies that provide these services.  Your insurance has been verified to be:  Medicare and [place] Your primary doctor is:  Dr. [name]  Pertinent information will be shared with your doctor and your insurance company.  Social  Worker:  Alfonse Alpers, LCSW  [phone] or ([phone]  Information discussed with and copy given to patient by: Trey Sailors, 01/17/2014, 12:39 PM
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
## 2014-01-18 ENCOUNTER — Inpatient Hospital Stay (HOSPITAL_COMMUNITY): Payer: Medicare Other | Admitting: Occupational Therapy

## 2014-01-18 ENCOUNTER — Inpatient Hospital Stay (HOSPITAL_COMMUNITY): Payer: Medicare Other | Admitting: *Deleted

## 2014-01-18 ENCOUNTER — Encounter (HOSPITAL_COMMUNITY): Payer: Medicare Other | Admitting: Occupational Therapy

## 2014-01-18 ENCOUNTER — Inpatient Hospital Stay (HOSPITAL_COMMUNITY): Payer: Medicare Other | Admitting: Speech Pathology

## 2014-01-18 DIAGNOSIS — I2510 Atherosclerotic heart disease of native coronary artery without angina pectoris: Secondary | ICD-10-CM

## 2014-01-18 DIAGNOSIS — S069XAA Unspecified intracranial injury with loss of consciousness status unknown, initial encounter: Secondary | ICD-10-CM

## 2014-01-18 DIAGNOSIS — I10 Essential (primary) hypertension: Secondary | ICD-10-CM

## 2014-01-18 DIAGNOSIS — W19XXXA Unspecified fall, initial encounter: Secondary | ICD-10-CM

## 2014-01-18 DIAGNOSIS — S069X9A Unspecified intracranial injury with loss of consciousness of unspecified duration, initial encounter: Secondary | ICD-10-CM

## 2014-01-18 DIAGNOSIS — D869 Sarcoidosis, unspecified: Secondary | ICD-10-CM

## 2014-01-18 NOTE — Progress Notes (Signed)
Physical Therapy Session Note  Patient Details  Name: Brenda [name] MRN: [id number] Date of Birth: 29-Apr-1931  Today's Date: 01/18/2014 Time: 9629-5284 Time Calculation (min): 10 min  Short Term Goals: Week 1:  PT Short Term Goal 1 (Week 1): Patient will perform bed mobility with supervision. PT Short Term Goal 2 (Week 1): Patient will perform functional transfers with supervision. PT Short Term Goal 3 (Week 1): Patient will ambulate 150' with LRAD and minA. PT Short Term Goal 4 (Week 1): Patient will negotiate 3 steps with one handrail and minA.  Skilled Therapeutic Interventions/Progress Updates:    Patient received sitting in wheelchair. Discussion with OT that patient is very motivated to discharge and may be able to discharge earlier than original LOS. Patient immediately refusing therapy, stating "I've already done a lot today." Attempted to encourage patient in various ways (husband, just go on a walk, stay in the room, do what she wants in therapy, etc.), but patient continues to refuse and states she "wants to get in bed." Discussion with patient and husband that by refusing therapy, patient will not be able to discharge as soon as she would probably like. Encouraged patient to participate in 3:00 PT session, but patient repeatedly states she will "most likely cancel it." Patient transferred wheelchair>bed with min guard and use of armrests to push up and bed rail to hold onto during stand pivot transfer. Patient left supine in bed with all needs within reach and bed alarm on.  Therapy Documentation Precautions:  Precautions Precautions: Fall Restrictions Weight Bearing Restrictions: No General: Amount of Missed PT Time (min): 35 Minutes Missed Time Reason: Patient unwilling/refused to participate without medical reason Pain: Pain Assessment Pain Assessment: 0-10 Pain Score: 4  Pain Type: Acute pain Pain Location: Foot Pain Orientation: Left Pain Descriptors / Indicators:  Aching;Sore Pain Onset: On-going Pain Intervention(s): RN made aware;Repositioned Multiple Pain Sites: No Locomotion : Ambulation Ambulation/Gait Assistance: Not tested (comment)   See FIM for current functional status  Therapy/Group: Individual Therapy  Lillia Abed. Ripa, PT, DPT 01/18/2014, 2:16 PM

## 2014-01-18 NOTE — Progress Notes (Signed)
Patient information reviewed and entered into eRehab system by Daiva Nakayama, RN, CRRN, [place] Coordinator.  Information including medical coding and functional independence measure will be reviewed and updated through discharge.     Per nursing patient/family was given "Data Collection Information Summary for Patients in Inpatient Rehabilitation Facilities with attached "Privacy Act [place] Records" upon admission.

## 2014-01-18 NOTE — Progress Notes (Signed)
Occupational Therapy Session Note  Patient Details  Name: Brenda [name] MRN: [id number] Date of Birth: Jul 01, 1931  Today's Date: 01/18/2014 Time: 1135-1200 Time Calculation (min): 25 min  Short Term Goals: Week 1:  OT Short Term Goal 1 (Week 1): Pt will complete toilet transfer with min assist and min cues OT Short Term Goal 2 (Week 1): Pt will complete LB dressing with min assist  OT Short Term Goal 3 (Week 1): Pt will attend to self-care task ~20 minutes with min cues  OT Short Term Goal 4 (Week 1): Pt will maintain dynamic standing balance ~5 minutes during self-care task with mod assist  OT Short Term Goal 5 (Week 1): Pt will demonstrate recall/carry over of functional transfers with min cues   Skilled Therapeutic Interventions/Progress Updates:  Patient received seated in w/c with quick release belt donned and husband present in room. Therapist propelled patient > dayroom for therapeutic activity focusing on dynamic standing balance/tolerance/endurace, functional mobility without use of AD, and overall activity tolerance/endurance. Patient with request to use bathroom. Therapist propelled patient back to room and patient performed stand pivot transfer <> BSC placed over elevated toilet seat with supervision>min assist using grab bars. Patient then ambulated > sink for grooming task of washing hands, then ambulated > dayroom with min assist (hand-over-hand assist). Patient ambulated back to room and therapist left patient seated in w/c with quick release belt donned and all needs within reach. Patient's husband present at end of session and able to assist with lunch.  Cognitively, patient's husband states he sees little to no change from baseline. This OT noticed some short term memory loss. Patient kept mentioning that she got her staples out this morning and perseverated on going home soon.   This OT talked with patient's primary PT regarding d/c planning. Patient very eager to discharge >  home and states that she has family who will provide supervision/assistance prn. As of now, clinically believe patient will be ready for discharge the beginning of next week, and will meet overall supervision goals. Therapist talked with patient about need for additional therapy and that she will benefit from additional CIR rehab prior to discharge home with home health therapy.  Precautions:  Precautions Precautions: Fall Restrictions Weight Bearing Restrictions: No  See FIM for current functional status  Therapy/Group: Individual Therapy  CLAY,PATRICIA 01/18/2014, 12:14 PM

## 2014-01-18 NOTE — Progress Notes (Signed)
Physical Therapy Note  Patient Details  Name: Brenda [name] MRN: [id number] Date of Birth: October 21, 1930 Today's Date: 01/18/2014  Pt missed 39min skilled PT due to fatigue. Attempted multiple options for tx, including bedside, but she is unmoved. Discussed importance of participation on functional improvement and pt is understanding, just unable to attempt this afternoon due to fatigue.   Kennieth Rad, PT, DPT  01/18/2014, 3:01 PM

## 2014-01-18 NOTE — Progress Notes (Signed)
Brenda [name] PHYSICAL MEDICINE & REHABILITATION     PROGRESS NOTE    Subjective/Complaints: Pain better. Had a pretty good day with therapy yesterday.   Objective: Vital Signs: Blood pressure 136/69, pulse 79, temperature 98 F (36.7 C), temperature source Oral, resp. rate 19, height 5\' 3"  (1.6 m), weight 69.673 kg (153 lb 9.6 oz), SpO2 93.00%. No results found.  Recent Labs  01/17/14 0450  WBC 7.0  HGB 8.5*  HCT 26.0*  PLT 245    Recent Labs  01/17/14 0450  NA 143  K 3.1*  CL 104  GLUCOSE 96  BUN 9  CREATININE 0.56  CALCIUM 6.3*   CBG (last 3)  No results found for this basename: GLUCAP,  in the last 72 hours  Wt Readings from Last 3 Encounters:  01/16/14 69.673 kg (153 lb 9.6 oz)  01/06/14 66.7 kg (147 lb 0.8 oz)  01/06/14 66.7 kg (147 lb 0.8 oz)    Physical Exam:  Constitutional: She appears well-developed and well-nourished.  HENT: oral mucosa generally pink and moist Craniotomy sites clean and dry  Eyes:  Pupils round and reactive to light without nystagmus  Neck: Normal range of motion. Neck supple. No thyromegaly present.  Cardiovascular: Normal rate and regular rhythm. Exam reveals no friction rub.  No murmur heard.  Respiratory: Effort normal and breath sounds normal. No respiratory distress. She has no wheezes. She has no rales.  GI: Soft. Bowel sounds are normal. She exhibits no distension. There is no tenderness.  Neurological: She is alert.  Patient is oriented to place, age and date of birth, todays date. She follows simple commands. Makes good eye contact with examiner. Borderline insight and awareness. Left facial weakness noted. Mild left inattention. Strength 4/5 RUE 4- LUE. RLE: 3+ to 4/5 proximal to distal. LLE 3- to 4-/5 prox to distal.  Musc: some tenderness along left knee with palpation. Chest wall also tender on left. Psychiatric: pt is pleasant, less anxious today Skin: incision clean with staples   Assessment/Plan: 1. Functional  deficits secondary to right subdural hemorrhage which require 3+ hours per day of interdisciplinary therapy in a comprehensive inpatient rehab setting. Physiatrist is providing close team supervision and 24 hour management of active medical problems listed below. Physiatrist and rehab team continue to assess barriers to discharge/monitor patient progress toward functional and medical goals. FIM: FIM - Bathing Bathing Steps Patient Completed: Chest;Right Arm;Left Arm;Abdomen;Right upper leg;Left upper leg;Right lower leg (including foot) Bathing: 3: Mod-Patient completes 5-7 64f 10 parts or 50-74%  FIM - Upper Body Dressing/Undressing Upper body dressing/undressing steps patient completed: Thread/unthread right sleeve of pullover shirt/dresss;Thread/unthread left sleeve of pullover shirt/dress;Put head through opening of pull over shirt/dress;Pull shirt over trunk Upper body dressing/undressing: 5: Set-up assist to: Obtain clothing/put away FIM - Lower Body Dressing/Undressing Lower body dressing/undressing steps patient completed: Thread/unthread right pants leg;Thread/unthread left pants leg Lower body dressing/undressing: 2: Max-Patient completed 25-49% of tasks  FIM - Toileting Toileting steps completed by patient: Performs perineal hygiene Toileting Assistive Devices: Grab bar or rail for support Toileting: 2: Max-Patient completed 1 of 3 steps  FIM - Radio producer Devices: Grab bars Toilet Transfers: 2-To toilet/BSC: Max A (lift and lower assist);2-From toilet/BSC: Max A (lift and lower assist)  FIM - Control and instrumentation engineer Devices: Arm rests Bed/Chair Transfer: 3: Bed > Chair or W/C: Mod A (lift or lower assist);3: Chair or W/C > Bed: Mod A (lift or lower assist)  FIM - Locomotion: Wheelchair  Distance: 70 Locomotion: Wheelchair: 2: Travels 50 - 149 ft with minimal assistance (Pt.>75%) FIM - Locomotion: Ambulation Locomotion:  Ambulation Assistive Devices: Other (comment) (2 person HHA) Ambulation/Gait Assistance: 1: +2 Total assist Locomotion: Ambulation: 1: Two helpers  Comprehension Comprehension Mode: Auditory Comprehension: 4-Understands basic 75 - 89% of the time/requires cueing 10 - 24% of the time  Expression Expression Mode: Verbal Expression: 5-Expresses basic needs/ideas: With extra time/assistive device  Social Interaction Social Interaction: 4-Interacts appropriately 75 - 89% of the time - Needs redirection for appropriate language or to initiate interaction.  Problem Solving Problem Solving: 3-Solves basic 50 - 74% of the time/requires cueing 25 - 49% of the time  Memory Memory: 3-Recognizes or recalls 50 - 74% of the time/requires cueing 25 - 49% of the time  Medical Problem List and Plan:  1. Functional deficits secondary to right subdural hematoma status post craniotomy 01/04/2014 with recurrent SDH and repeat craniotomy x2   -can remove staples today 2. DVT Prophylaxis/Anticoagulation: SCDs.   3. Pain Management: Hydrocodone as needed. Added lidoderm patches for chest wall pain 4. seizure prophylaxis. Keppra 1000 mg every 12 hours, Dilantin 100 mg every 8 hours. EEG negative  5. Neuropsych: This patient is not capable of making decisions on her own behalf.  6. Dysphagia. Dysphagia 3 nectar liquids. Monitor for any signs of aspiration. Followup speech therapy  7. Hypothyroidism. Synthroid  LOS (Days) 2 A FACE TO FACE EVALUATION WAS PERFORMED  SWARTZ,[name] 01/18/2014 8:21 AM

## 2014-01-18 NOTE — Progress Notes (Signed)
Occupational Therapy Session Note  Patient Details  Name: Brenda [name] MRN: [id number] Date of Birth: 09-09-1930  Today's Date: 01/18/2014 Time: 0930-1030 Time Calculation: 60 min  Short Term Goals: Week 1:  OT Short Term Goal 1 (Week 1): Pt will complete toilet transfer with min assist and min cues OT Short Term Goal 2 (Week 1): Pt will complete LB dressing with min assist  OT Short Term Goal 3 (Week 1): Pt will attend to self-care task ~20 minutes with min cues  OT Short Term Goal 4 (Week 1): Pt will maintain dynamic standing balance ~5 minutes during self-care task with mod assist  OT Short Term Goal 5 (Week 1): Pt will demonstrate recall/carry over of functional transfers with min cues   Skilled Therapeutic Interventions/Progress Updates:   Pt seen for ADL retraining with focus on functional transfers, postural control, standing balance, activity tolerance, and overall cognitive remediation. Pt received sitting in w/c with quick release belt on and oriented x4. Pt requesting to use toilet requiring mod A and mod cues to transfer from w/c>toilet however pt incontinent at this time. Transferred back to w/c with mod A and min cues. Pt perseverating on immediate d/c home during session requiring max cues for awareness to deficits, problem solving and redirection to amount of assistance needed at this time. Completed bathing and dressing in w/c at sink with min cues for attention and sequencing requiring mod assist/cues for sit<>stand and anterior learn forward. Pt only requiring min A for standing balance today during peri hygiene and manipulation of clothing over hips. Engaged in standing at sink for ~1 minute with min A and mod cues using mirror as aid secondary to decreased postural control in standing. Pt able to don socks today however difficulty with donning left shoe requiring extra time and mod cues to complete. Practiced toilet transfer with elevated toilet seat demonstrating improved  awareness and postural control only requiring min A and min cues to complete. Pt left sitting in w/c with husband present, quick release belt on and all needs in reach.    Therapy Documentation Precautions:  Precautions Precautions: Fall Restrictions Weight Bearing Restrictions: No    Pain:  Pt reporting no pain at this time.   ADL:   See FIM for current functional status  Therapy/Group: Individual Therapy  Spake,Lauren Brooke 01/18/2014, 12:15 PM

## 2014-01-18 NOTE — IPOC Note (Addendum)
Overall Plan of Care [facility]) Patient Details Name: Brenda [name] MRN: [id number] DOB: [birth year]  Admitting Diagnosis: R SDH  Hospital Problems: Active Problems:   SDH (subdural hematoma)     Functional Problem List: Nursing Bladder;Bowel;Safety;Medication Management  PT Balance;Endurance;Motor;Pain;Perception;Safety  OT Balance;Cognition;Endurance;Motor;Pain;Perception;Safety  SLP Cognition;Nutrition  TR         Basic ADL's: OT Eating;Grooming;Bathing;Dressing;Toileting     Advanced  ADL's: OT       Transfers: PT Bed Mobility;Bed to Chair;Car;Furniture  OT Toilet;Tub/Shower     Locomotion: PT Ambulation;Wheelchair Mobility;Stairs     Additional Impairments: OT    SLP Swallowing;Social Cognition   Social Interaction;Problem Solving;Memory;Attention;Awareness  TR      Anticipated Outcomes Item Anticipated Outcome  Self Feeding Supervision  Swallowing  mod I with least restrictive diet    Basic self-care  Supervision  Toileting  Supervision   Bathroom Transfers Supervision  Bowel/Bladder  min assist  Transfers  mod I  Locomotion  mod I  Communication  n/a  Cognition  supervision   Pain  less than or equal to 3  Safety/Judgment  min assist   Therapy Plan: PT Intensity: Minimum of 1-2 x/day ,45 to 90 minutes PT Frequency: 5 out of 7 days PT Duration Estimated Length of Stay: 12-14 days OT Intensity: Minimum of 1-2 x/day, 45 to 90 minutes OT Frequency: 5 out of 7 days SLP Intensity: Minumum of 1-2 x/day, 30 to 90 minutes SLP Frequency: 5 out of 7 days SLP Duration/Estimated Length of Stay: 12-14 days       Team Interventions: Nursing Interventions Patient/Family Education;Pain Management;Bladder Management;Bowel Management;Medication Management;Psychosocial Support  PT interventions Ambulation/gait training;Disease management/prevention;Pain management;Stair training;Visual/perceptual remediation/compensation;Wheelchair  propulsion/positioning;Therapeutic Activities;DME/adaptive equipment instruction;Patient/family education;Balance/vestibular training;Cognitive remediation/compensation;Psychosocial support;Therapeutic Exercise;UE/LE Strength taining/ROM;Skin care/wound management;Functional mobility training;Community reintegration;Discharge planning;Neuromuscular re-education;Splinting/orthotics;UE/LE Coordination activities  OT Interventions Balance/vestibular training;Cognitive remediation/compensation;Community reintegration;Discharge planning;DME/adaptive equipment instruction;Functional mobility training;Pain management;Neuromuscular re-education;Patient/family education;Self Care/advanced ADL retraining;Therapeutic Activities;UE/LE Strength taining/ROM;Visual/perceptual remediation/compensation;UE/LE Coordination activities;Wheelchair propulsion/positioning;Therapeutic Exercise  SLP Interventions Cognitive remediation/compensation;Cueing hierarchy;Dysphagia/aspiration precaution training;Functional tasks;Patient/family education;Internal/external aids;Environmental controls  TR Interventions    SW/CM Interventions Discharge Planning;Psychosocial Support;Patient/Family Education    Team Discharge Planning: Destination: PT-Home ,OT- Home , SLP-Home Projected Follow-up: PT-Home health PT;Outpatient PT (TBD), OT-  Home health OT;24 hour supervision/assistance, SLP-Home Health SLP;Outpatient SLP;24 hour supervision/assistance Projected Equipment Needs: PT-To be determined, OT-  , SLP-None recommended by SLP Equipment Details: PT-Patient owns RW, SPC, and RTS; DME recommendations TBD upon discharge, OT-  Patient/family involved in discharge planning: PT- Patient;Family member/caregiver,  OT-Patient;Family member/caregiver (husband), SLP-Patient  MD ELOS: 12-14 days Medical Rehab Prognosis:  Excellent Assessment: The patient has been admitted for CIR therapies with the diagnosis of TBI. The team will be addressing  functional mobility, strength, stamina, balance, safety, adaptive techniques and equipment, self-care, bowel and bladder mgt, patient and caregiver education, NMR, cognitive and visual perceptual awareness, BI education, pain mgt anxiety mgt and egosupport. Goals have been set at supervision to min assist for mobility, self-care, cognition, and swallowing.    Meredith Staggers, MD, FAAPMR      See Team Conference Notes for weekly updates to the plan of care

## 2014-01-18 NOTE — Progress Notes (Signed)
Speech Language Pathology Daily Session Note  Patient Details  Name: Brenda [name] MRN: [id number] Date of Birth: Oct 02, 1930  Today's Date: 01/18/2014 Time: 1030-1125 Time Calculation (min): 55 min  Short Term Goals: Week 1: SLP Short Term Goal 1 (Week 1): Pt will tolerate her currently prescribed diet with no overt s/s of aspiration with modified independence. SLP Short Term Goal 2 (Week 1): Pt will improve recall of daily events and information for 80% accuracy with min-mod assist. SLP Short Term Goal 3 (Week 1): Pt will improve executive function for sequencing, organization, and error awareness for 80% accuracy with min-mod assist  SLP Short Term Goal 4 (Week 1): Pt will identify at least 1 cognitive impairment and 2 physical impairments and their impact on her functional independence in her home environment  with min question cuing.   SLP Short Term Goal 5 (Week 1): Pt will selectively attend to structured tasks in a moderately distracting environment for 7-10 minutes with min assist cuing for redirection.  SLP Short Term Goal 6 (Week 1): Pt will improve topic maintenance during functional conversations with no more than 3 cues for redirection and min assist.   Skilled Therapeutic Interventions:  Pt was seen for skilled speech therapy targeting dysphagia management and recall of daily events and information.  Pt's husband was present for the duration of today's therapy session; as a result, SLP provided skilled education related to dysphagia and rationale behind pt's currently prescribed diet in the setting of improved cognition.  SLP also provided extensive education to pt's husband related to the water protocol including the need for thorough oral care prior to intake, water only, and only outside of meals.  Pt's husband verbalized understanding of recommendations and immediately recalled 100% of water protocol precautions with questioning cues.  Pt's husband okay to supervise pt with cup  sips of water.  SLP also updated pt's husband on current goals and progress in therapy.  Pt's husband reports that pt is close to her baseline cognition and verified that pt was independently managing medications and writing checks.  SLP recommended initial assist for medication and financial management upon discharge.  Pt and pt's husband agreeable.    FIM:  Comprehension Comprehension Mode: Auditory Comprehension: 5-Understands basic 90% of the time/requires cueing < 10% of the time Expression Expression Mode: Verbal Expression: 5-Expresses basic needs/ideas: With extra time/assistive device Social Interaction Social Interaction: 5-Interacts appropriately 90% of the time - Needs monitoring or encouragement for participation or interaction. Problem Solving Problem Solving: 3-Solves basic 50 - 74% of the time/requires cueing 25 - 49% of the time Memory Memory: 4-Recognizes or recalls 75 - 89% of the time/requires cueing 10 - 24% of the time  Pain Pain Assessment Pain Assessment: No/denies pain  Therapy/Group: Individual Therapy  Windell Moulding, M.A. CCC-SLP  Page, Selinda Orion 01/18/2014, 3:49 PM

## 2014-01-19 ENCOUNTER — Inpatient Hospital Stay (HOSPITAL_COMMUNITY): Payer: Medicare Other | Admitting: Speech Pathology

## 2014-01-19 ENCOUNTER — Inpatient Hospital Stay (HOSPITAL_COMMUNITY): Payer: Medicare Other | Admitting: *Deleted

## 2014-01-19 ENCOUNTER — Inpatient Hospital Stay (HOSPITAL_COMMUNITY): Payer: Medicare Other | Admitting: Occupational Therapy

## 2014-01-19 DIAGNOSIS — S069X9A Unspecified intracranial injury with loss of consciousness of unspecified duration, initial encounter: Secondary | ICD-10-CM

## 2014-01-19 DIAGNOSIS — S069XAA Unspecified intracranial injury with loss of consciousness status unknown, initial encounter: Secondary | ICD-10-CM

## 2014-01-19 NOTE — Progress Notes (Signed)
Speech Language Pathology Daily Session Note  Patient Details  Name: Brenda [name] MRN: [id number] Date of Birth: [birth year]  Today's Date: 01/19/2014 Time: 1130-1200 Time Calculation (min): 30 mins  Short Term Goals: Week 1: SLP Short Term Goal 1 (Week 1): Pt will tolerate her currently prescribed diet with no overt s/s of aspiration with modified independence. SLP Short Term Goal 2 (Week 1): Pt will improve recall of daily events and information for 80% accuracy with min-mod assist. SLP Short Term Goal 3 (Week 1): Pt will improve executive function for sequencing, organization, and error awareness for 80% accuracy with min-mod assist  SLP Short Term Goal 4 (Week 1): Pt will identify at least 1 cognitive impairment and 2 physical impairments and their impact on her functional independence in her home environment  with min question cuing.   SLP Short Term Goal 5 (Week 1): Pt will selectively attend to structured tasks in a moderately distracting environment for 7-10 minutes with min assist cuing for redirection.  SLP Short Term Goal 6 (Week 1): Pt will improve topic maintenance during functional conversations with no more than 3 cues for redirection and min assist.   Skilled Therapeutic Interventions: Skilled ST intervention provided with focus on cognitive/home management goals. Slp introduced concept of pill organizer, providing visual model. Pt organized medications accordingly into organizer with 95% accuracy, min A. Pt states that she does not use organizer at home but has a system she follows, using bottles. Husband present, who agrees that wife has a good system at home with her medications. Pt attended throughout tx session, demonstrating appropriate sustained attention for 15+ minutes. Pt oriented x4.    FIM:  Comprehension Comprehension Mode: Auditory Comprehension: 5-Understands basic 90% of the time/requires cueing < 10% of the time Expression Expression Mode: Verbal Expression:  5-Expresses basic needs/ideas: With extra time/assistive device Social Interaction Social Interaction: 5-Interacts appropriately 90% of the time - Needs monitoring or encouragement for participation or interaction. Problem Solving Problem Solving: 4-Solves basic 75 - 89% of the time/requires cueing 10 - 24% of the time Memory Memory: 4-Recognizes or recalls 75 - 89% of the time/requires cueing 10 - 24% of the time  Pain Pain Assessment Pain Assessment: No/denies pain Pain Score: 0-No pain  Therapy/Group: Individual Therapy  McMasters, Bernerd Pho 01/19/2014, 12:29 PM

## 2014-01-19 NOTE — Progress Notes (Signed)
Physical Therapy Session Note  Patient Details  Name: Brenda [name] MRN: [id number] Date of Birth: Jun 03, 1931  Today's Date: 01/19/2014 Time: 5284-1324 and 4010-2725 Time Calculation (min): 58 min and 26 min  Short Term Goals: Week 1:  PT Short Term Goal 1 (Week 1): Patient will perform bed mobility with supervision. PT Short Term Goal 2 (Week 1): Patient will perform functional transfers with supervision. PT Short Term Goal 3 (Week 1): Patient will ambulate 150' with LRAD and minA. PT Short Term Goal 4 (Week 1): Patient will negotiate 3 steps with one handrail and minA.  Skilled Therapeutic Interventions/Progress Updates:    AM Session: Patient received sitting in wheelchair, husband present. Patient very anxious and motivated to go home Monday. Discussion with patient and husband about CLOF, anticipated level of function at d/c, recommendation of supervision in community, etc. Discussion with patient that d/c will depend on how she performs next few days of therapy.  Remainder of session focused on functional ambulation, stair negotiation, and BERG Balance Test; see details below for BERG score. Patient performed functional ambulation 160' x1 with R HHA and minA, 160' x1 and 9' x1 with RW and supervision. Patient much more steady with use of RW and patient agrees with this. Patient negotiated 5 steps with L handrail ascending (R handrail descending) to simulate STE home, uses B UEs on handrail and negotiates sideways. Patient negotiated 5 steps again, this time with management of RW secondary to recommendations to use RW at all times at home. Additionally, patient's husband will not be able to manage RW entering/exiting home. Patient able to negotiate steps with RW by placing RW at top of steps when entering and bottom of steps when exiting, then uses handrail to negotiate. Patient returned to room and left sitting in wheelchair with all needs within reach. Trial without seatbelt on, patient  verbalized understanding to not get up unassisted.  PM Session: Patient received ambulating out of bathroom with nurse tech. Session focused on functional ambulation, car transfers, and floor transfers. Patient performed functional ambulation with RW 160' x1, 155' x1, and 200' x1 with supervision, cues for upright posture. Patient performed car transfer with RW and supervision, verbal cues for proper sequencing. Discussion/education with patient and husband about falls risk at home and indications vs. Contraindications for attempting floor transfer vs. Calling EMS s/p fall and use of sturdy furniture for UE support. Patient performed floor transfer with use of mat for UE support and supervision cues for sequencing. Patient returned to room and left supine in bed with bed alarm on and all needs within reach.  Therapy Documentation Precautions:  Precautions Precautions: Fall Precaution Comments: R crani Restrictions Weight Bearing Restrictions: No Pain: Pain Assessment Pain Assessment: No/denies pain Pain Score: 0-No pain Locomotion : Ambulation Ambulation/Gait Assistance: 5: Supervision  Balance: Balance Balance Assessed: Yes Standardized Balance Assessment Standardized Balance Assessment: Berg Balance Test Berg Balance Test Sit to Stand: Able to stand without using hands and stabilize independently Standing Unsupported: Able to stand safely 2 minutes Sitting with Back Unsupported but Feet Supported on Floor or Stool: Able to sit safely and securely 2 minutes Stand to Sit: Sits safely with minimal use of hands Transfers: Able to transfer safely, definite need of hands Standing Unsupported with Eyes Closed: Able to stand 10 seconds safely Standing Ubsupported with Feet Together: Able to place feet together independently and stand 1 minute safely From Standing, Reach Forward with Outstretched Arm: Can reach forward >12 cm safely (5") From Standing Position,  Pick up Object from Floor:  Able to pick up shoe, needs supervision From Standing Position, Turn to Look Behind Over each Shoulder: Turn sideways only but maintains balance Turn 360 Degrees: Able to turn 360 degrees safely but slowly Standing Unsupported, Alternately Place Feet on Step/Stool: Able to complete >2 steps/needs minimal assist Standing Unsupported, One Foot in Front: Able to plae foot ahead of the other independently and hold 30 seconds Standing on One Leg: Tries to lift leg/unable to hold 3 seconds but remains standing independently Total Score: 42/56, indicating patient is at significant risk for falls (>80%).  See FIM for current functional status  Therapy/Group: Individual Therapy  Lillia Abed. Ripa, PT, DPT  01/19/2014, 12:26 PM

## 2014-01-19 NOTE — Progress Notes (Signed)
Occupational Therapy Session Note  Patient Details  Name: Brenda [name] MRN: [id number] Date of Birth: 1931/04/06  Today's Date: 01/19/2014 Time: 4239-5320 Time Calculation (min): 60 min  Short Term Goals: Week 1:  OT Short Term Goal 1 (Week 1): Pt will complete toilet transfer with min assist and min cues OT Short Term Goal 2 (Week 1): Pt will complete LB dressing with min assist  OT Short Term Goal 3 (Week 1): Pt will attend to self-care task ~20 minutes with min cues  OT Short Term Goal 4 (Week 1): Pt will maintain dynamic standing balance ~5 minutes during self-care task with mod assist  OT Short Term Goal 5 (Week 1): Pt will demonstrate recall/carry over of functional transfers with min cues   Skilled Therapeutic Interventions/Progress Updates: Bed to w/c stand pivot transfer with CGA followed by ADL in w/c at sink with focus on static and dynamic balance, sit to stand and lower body dressing, as well as STM and processing skills.   Patient without reported posterior leans or any losses of balance and able to complete sit to stand and dynamic standing balance at the sink holding on at times with one hand to safely don lower body dressing.  She did require a little extra time to remember some short term memory facts and to initiate the next tasks.    With this clinician being a new clinician, may have been part of the reason for some of her delayed processing of initiation and sequence of tasks.  Husband who walked into patient's room with a rollater walker was present for last 10 minutes of session and stated he has been approved to assist wife with breakfast - as verified on the Speech Therapy instructions hanging above her bed.  Husband appeared supportive and able to recall important facts and instructions.  Patient stated, "The doctor said I can probably go home Monday and have therapy at home."  Safety place put in place on patient and her w/c.  Husband to place call bell near his  wife if he were to leave the room.     Therapy Documentation Precautions:  Precautions Precautions: Fall Restrictions Weight Bearing Restrictions: No  Pain: denied   See FIM for current functional status  Therapy/Group: Individual Therapy  Alfredia Ferguson [facility] 01/19/2014, 8:22 AM

## 2014-01-19 NOTE — Progress Notes (Signed)
Patient ID: Brenda [name], female   DOB: 02-19-1931, 80 y.o.   MRN: [id number]    [place] [facility]ILIT[facility]     PROGRESS NOTE   01/19/14.  Subjective/Complaints:  80 y/o WF admit for CIR with  functional deficits secondary to right subdural hematoma status post craniotomy 01/04/2014 with recurrent SDH and repeat craniotomy x2   Pain better. Had a pretty good day with therapy yesterday.   Objective: Vital Signs: Blood pressure 123/71, pulse 91, temperature 98.2 F (36.8 C), temperature source Oral, resp. rate 18, height 5\' 3"  (1.6 m), weight 153 lb 9.6 oz (69.673 kg), SpO2 96.00%. No results found.  Recent Labs  01/17/14 0450  WBC 7.0  HGB 8.5*  HCT 26.0*  PLT 245    Recent Labs  01/17/14 0450  NA 143  K 3.1*  CL 104  GLUCOSE 96  BUN 9  CREATININE 0.56  CALCIUM 6.3*   CBG (last 3)  No results found for this basename: GLUCAP,  in the last 72 hours  Wt Readings from Last 3 Encounters:  01/16/14 153 lb 9.6 oz (69.673 kg)  01/06/14 147 lb 0.8 oz (66.7 kg)  01/06/14 147 lb 0.8 oz (66.7 kg)    Intake/Output Summary (Last 24 hours) at 01/19/14 0918 Last data filed at 01/18/14 1800  Gross per 24 hour  Intake    600 ml  Output      0 ml  Net    600 ml    Patient Vitals for the past 24 hrs:  BP Temp Temp src Pulse Resp SpO2  01/19/14 0614 123/71 mmHg 98.2 F (36.8 C) Oral 91 18 96 %  01/18/14 2200 134/72 mmHg 97.8 F (36.6 C) Oral 70 18 93 %   Physical Exam:  Constitutional: She appears well-developed and well-nourished.  HENT: oral mucosa generally pink and moist Craniotomy sites clean and dry ; staples removed 6/26. Eyes:  Pupils round and reactive to light without nystagmus  Neck: Normal range of motion. Neck supple. No thyromegaly present.  Cardiovascular: Normal rate and regular rhythm. Exam reveals no friction rub.  Gr 3/6 SEM loudest at primary AA. Respiratory: Effort normal and breath sounds normal. No respiratory distress. She  has no wheezes. She has no rales.  GI: Soft. Bowel sounds are normal. She exhibits no distension. There is no tenderness.  Neurological: She is alert.  Patient is oriented to place, age and date of birth, todays date. She follows simple commands. Makes good eye contact with examiner. Borderline insight and awareness. Left facial weakness noted. Mild left inattention. Strength 4/5 RUE 4- LUE. RLE: 3+ to 4/5 proximal to distal. LLE 3- to 4-/5 prox to distal. Psychiatric: pt is pleasant, less anxious today Skin: incision clean    Assessment/Plan: Medical Problem List and Plan:  1. Functional deficits secondary to right subdural hematoma status post craniotomy 01/04/2014 with recurrent SDH and repeat craniotomy x2   -can remove staples today 2. DVT Prophylaxis/Anticoagulation: SCDs.   3. Pain Management: Hydrocodone as needed. Added lidoderm patches for chest wall pain 4. seizure prophylaxis. Keppra 1000 mg every 12 hours, Dilantin 100 mg every 8 hours. EEG negative  5. Neuropsych: This patient is not capable of making decisions on her own behalf.  6. Dysphagia. Dysphagia 3 nectar liquids. Monitor for any signs of aspiration. Followup speech therapy  7. Hypothyroidism. Synthroid  LOS (Days) 3 A FACE TO FACE EVALUATION WAS PERFORMED  Nyoka Cowden 01/19/2014 9:16 AM

## 2014-01-19 NOTE — Plan of Care (Signed)
Problem: RH Wheelchair Mobility Goal: LTG Patient will propel w/c in controlled environment (PT) LTG: Patient will propel wheelchair in controlled environment, # of feet with assist (PT)  Outcome: Not Applicable Date Met:  77/82/42 Goal discharged 01/19/14 secondary to patient ambulatory

## 2014-01-20 ENCOUNTER — Inpatient Hospital Stay (HOSPITAL_COMMUNITY): Payer: Medicare Other | Admitting: Occupational Therapy

## 2014-01-20 NOTE — Progress Notes (Signed)
Occupational Therapy Session Note  Patient Details  Name: Brenda [name] MRN: [id number] Date of Birth: 1931/03/14  Today's Date: 01/20/2014 Time: 1100-1200 Time Calculation (min): 60 min  Short Term Goals: Week 1:  OT Short Term Goal 1 (Week 1): Pt will complete toilet transfer with min assist and min cues OT Short Term Goal 2 (Week 1): Pt will complete LB dressing with min assist  OT Short Term Goal 3 (Week 1): Pt will attend to self-care task ~20 minutes with min cues  OT Short Term Goal 4 (Week 1): Pt will maintain dynamic standing balance ~5 minutes during self-care task with mod assist  OT Short Term Goal 5 (Week 1): Pt will demonstrate recall/carry over of functional transfers with min cues   Skilled Therapeutic Interventions/Progress Updates:ADL in w/c at sink right after she'd just finished toileting with nursing as follows:  Toilet transfer from regular toilet to her w/c in front of the sink via walker= close S; Mrs. Lagerquist was able to attend to her complete bathing and dressing tasks without cues to engage or continue or sequence today.   She did require a cue to lock her brakes x 1 before standing and a suggestion was made to slow down for safety during ADL, especially for functional mobility or dynamic standing tasks. As she hurried through the task, she did have one posterior loss of balance back into her wheel chair today while pericleansing today. However, when she stood to pull up pants and slowed down a bit, taking about 2 minutes to do so, she had no losses of balance.  She stated that she had four children and has much housework to do now and that is the reason why she hurries and completes all tasks fast.    Her husband was present and supportive for the entire session.     Therapy Documentation Precautions:  Precautions Precautions: Fall Precaution Comments: R crani Restrictions Weight Bearing Restrictions: No   Pain: denied     See FIM for current functional  status  Therapy/Group: Individual Therapy  Alfredia Ferguson [facility] 01/20/2014, 12:32 PM

## 2014-01-20 NOTE — Plan of Care (Signed)
Problem: RH COGNITION-NURSING Goal: RH STG ANTICIPATES NEEDS/CALLS FOR ASSIST W/ASSIST/CUES STG Anticipates Needs/Calls for Assist With Assistance/Cues.  Outcome: Not Progressing Patient not calling staff for assistance. Patient educated on using call bell for assistance. Adm

## 2014-01-20 NOTE — Progress Notes (Signed)
Patient ID: Brenda [name], female   DOB: [birth year], 80 y.o.   MRN: [id number]  Patient ID: Brenda [name], female   DOB: [birth year], 80 y.o.   MRN: [id number]    [place] [facility]IL[facility]     PROGRESS NOTE   01/20/14.  Subjective/Complaints:  80 y/o WF admit for CIR with  functional deficits secondary to right subdural hematoma status post craniotomy 01/04/2014 with recurrent SDH and repeat craniotomy x2   Pain better. Had a pretty good day with therapy yesterday.  Anxious for d/c to assist with care of husband who has ALS.  Past Medical History  Diagnosis Date  . Hypertension   . Hyperlipidemia   . GERD (gastroesophageal reflux disease)   . Osteoarthritis   . Sarcoidosis   . Goiter   . Sicca   . Anxiety   . IBS (irritable bowel syndrome)   . Hemorrhoids   . DJD (degenerative joint disease)   . Hx of colonic polyps   . History of colonoscopy   . Asthma   . Barrett's esophagus   . History of shingles     face/eye    Objective: Vital Signs: Blood pressure 155/68, pulse 75, temperature 98.2 F (36.8 C), temperature source Oral, resp. rate 18, height 5\' 3"  (1.6 m), weight 153 lb 9.6 oz (69.673 kg), SpO2 90.00%. No results found. No results found for this basename: WBC, HGB, HCT, PLT,  in the last 72 hours No results found for this basename: NA, K, CL, CO, GLUCOSE, BUN, CREATININE, CALCIUM,  in the last 72 hours CBG (last 3)  No results found for this basename: GLUCAP,  in the last 72 hours  Wt Readings from Last 3 Encounters:  01/16/14 153 lb 9.6 oz (69.673 kg)  01/06/14 147 lb 0.8 oz (66.7 kg)  01/06/14 147 lb 0.8 oz (66.7 kg)    Intake/Output Summary (Last 24 hours) at 01/20/14 0825 Last data filed at 01/19/14 1300  Gross per 24 hour  Intake    360 ml  Output      0 ml  Net    360 ml    Patient Vitals for the past 24 hrs:  BP Temp Temp src Pulse Resp SpO2  01/20/14 0542 155/68 mmHg 98.2 F (36.8 C) Oral 75 18 90 %  01/19/14 2148 143/81  mmHg 98.7 F (37.1 C) Oral 73 18 92 %  01/19/14 1500 126/66 mmHg 98.7 F (37.1 C) Oral 75 18 91 %   Physical Exam:  Constitutional: She appears well-developed and well-nourished.  HENT: oral mucosa generally pink and moist Craniotomy sites clean and dry ; staples removed 6/26. Eyes:  Pupils round and reactive to light without nystagmus  Neck: Normal range of motion. Neck supple. No thyromegaly present.  Cardiovascular: Normal rate and regular rhythm. Exam reveals no friction rub.  Gr 3/6 SEM loudest at primary AA. Respiratory: Effort normal and breath sounds normal. No respiratory distress. She has no wheezes. She has no rales.  GI: Soft. Bowel sounds are normal. She exhibits no distension. There is no tenderness.  Neurological: She is alert.  Patient is oriented  Mild left inattention. Strength 4/5 RUE 4- LUE. RLE: 3+ to 4/5 proximal to distal. LLE 3- to 4-/5 prox to distal.  Psychiatric: pt is pleasant, less anxious today Skin: incision clean    Assessment/Plan: Medical Problem List and Plan:  1. Functional deficits secondary to right subdural hematoma status post craniotomy 01/04/2014 with recurrent SDH and repeat craniotomy x2  staples removed 6/26. 2. DVT Prophylaxis/Anticoagulation: SCDs.   3. Pain Management: Hydrocodone as needed. Added lidoderm patches for chest wall pain 4. seizure prophylaxis. Keppra 1000 mg every 12 hours, Dilantin 100 mg every 8 hours. EEG negative  5. Neuropsych: This patient is not capable of making decisions on her own behalf.  6. Dysphagia. Dysphagia 3 nectar liquids. Monitor for any signs of aspiration. Followup speech therapy  7. Hypothyroidism. Synthroid  LOS (Days) 4 A FACE TO FACE EVALUATION WAS PERFORMED  Nyoka Cowden 01/20/2014 8:25 AM

## 2014-01-21 ENCOUNTER — Inpatient Hospital Stay (HOSPITAL_COMMUNITY): Payer: Medicare Other | Admitting: Occupational Therapy

## 2014-01-21 ENCOUNTER — Inpatient Hospital Stay (HOSPITAL_COMMUNITY): Payer: Medicare Other | Admitting: Physical Therapy

## 2014-01-21 ENCOUNTER — Inpatient Hospital Stay (HOSPITAL_COMMUNITY): Payer: Medicare Other

## 2014-01-21 DIAGNOSIS — I2510 Atherosclerotic heart disease of native coronary artery without angina pectoris: Secondary | ICD-10-CM

## 2014-01-21 DIAGNOSIS — D869 Sarcoidosis, unspecified: Secondary | ICD-10-CM

## 2014-01-21 DIAGNOSIS — W19XXXA Unspecified fall, initial encounter: Secondary | ICD-10-CM

## 2014-01-21 DIAGNOSIS — S069X9A Unspecified intracranial injury with loss of consciousness of unspecified duration, initial encounter: Secondary | ICD-10-CM

## 2014-01-21 DIAGNOSIS — S069XAA Unspecified intracranial injury with loss of consciousness status unknown, initial encounter: Secondary | ICD-10-CM

## 2014-01-21 DIAGNOSIS — I10 Essential (primary) hypertension: Secondary | ICD-10-CM

## 2014-01-21 MED ORDER — TRAZODONE HCL 50 MG PO TABS
25.0000 mg | ORAL_TABLET | Freq: Every evening | ORAL | Status: DC | PRN
  Administered 2014-01-21: 25 mg via ORAL
  Filled 2014-01-21: qty 1

## 2014-01-21 NOTE — Progress Notes (Signed)
OT session note reviewed and this OT agrees with information provided.

## 2014-01-21 NOTE — Progress Notes (Signed)
Speech Language Pathology Discharge Summary & Final Treatment Note  Patient Details  Name: Brenda [name] MRN: [id number] Date of Birth: 22-Aug-1930  Today's Date: 01/21/2014 Time: 1117-1201 Time Calculation (min): 44 min  Skilled Therapeutic Interventions:  Skilled treatment focused on education with husband present for session. SLP facilitated session with review of current level of function in regards to swallowing and cognition, as well as education about recommendations upon discharge with emphasis on advancing diet with [facility] SLP. Pt required supervision level question cues for anticipatory awareness throughout discussion about d/c planning. Pt and husband both verbalized their understanding of the information provided with all questions answered.    Patient has met 8 of 8 long term goals.  Patient to discharge at overall Supervision level.  Reasons goals not met: n/a   Clinical Impression/Discharge Summary: Pt has met 8 out of 8 LTGs during this admission due to improved oropharyngeal function, attention, awareness, and problem solving. Overall, pt and her spouse believe that she has returned to her baseline level of cognitive function, which was independent PTA. SLP strongly encouraged supervision upon return home, at least while she is being reintroduced to more challenging tasks. Pt is discharging on Dys 2 textures and nectar thick liquids with the water protocol, and will benefit from further [facility] SLP services to maximize swallowing safety and functional independence.   Care Partner:  Caregiver Able to Provide Assistance: Yes  Type of Caregiver Assistance: Cognitive  Recommendation:  Home Health SLP;24 hour supervision/assistance  Rationale for SLP Follow Up: Maximize cognitive function and independence;Maximize swallowing safety   Equipment:   n/a  Reasons for discharge: Treatment goals met;Discharged from hospital   Patient/Family Agrees with Progress Made and Goals Achieved: Yes    See FIM for current functional status   Germain Osgood, M.A. CCC-SLP [phone]  Germain Osgood 01/21/2014, 2:58 PM

## 2014-01-21 NOTE — Progress Notes (Signed)
Occupational Therapy Discharge Summary  Patient Details  Name: ZOELLE MARKUS MRN: [id number] Date of Birth: [birth year]  Today's Date: 01/22/2014  Patient has met 45 of 13 long term goals due to improved activity tolerance, improved balance, postural control, ability to compensate for deficits, improved attention, improved awareness and improved coordination.  Patient to discharge at overall supervision > mod I level.  Patient's care partner is independent to provide the necessary supervision prn assistance at discharge. Patient very happy and eager to discharge > home.   Reasons goals not met: n/a, all goals met at this time.  Recommendation:  Patient will benefit from ongoing skilled OT services in home health setting to continue to advance functional skills in the area of BADL, iADL and Reduce care partner burden.  Equipment: No equipment provided, patient already has needed equipment at home - [facility] & tub transfer bench  Reasons for discharge: treatment goals met and discharge from hospital  Patient/family agrees with progress made and goals achieved: Yes  Precautions/Restrictions  Precautions Precautions: Fall Precaution Comments: R crani Restrictions Weight Bearing Restrictions: No  ADL - See FIM   Vision/Perception  Vision- History Baseline Vision/History: Cataracts;No visual deficits (cataracts surgery) Wears Glasses: At all times Patient Visual Report: No change from baseline Vision- Assessment Vision Assessment?: No apparent visual deficits   Cognition Overall Cognitive Status: Impaired/Different from baseline Arousal/Alertness: Awake/alert Orientation Level: Oriented X4 Attention: Sustained;Selective Sustained Attention: Impaired Sustained Attention Impairment: Functional basic;Verbal basic Selective Attention: Impaired Selective Attention Impairment: Verbal basic;Functional basic Memory: Impaired Memory Impairment: Decreased short term memory;Decreased recall  of new information Decreased Short Term Memory: Verbal basic;Functional basic Awareness: Impaired Awareness Impairment: Anticipatory impairment;Emergent impairment Problem Solving: Impaired Problem Solving Impairment: Functional complex;Verbal complex Safety/Judgment: Impaired  Sensation Sensation Light Touch: Appears Intact Hot/Cold: Appears Intact Proprioception: Appears Intact Coordination Gross Motor Movements are Fluid and Coordinated: Yes Fine Motor Movements are Fluid and Coordinated: Yes Finger Nose Finger Test: intact  Motor  Motor Motor: Within Functional Limits  Trunk/Postural Assessment  Cervical Assessment Cervical Assessment: Exceptions to [facility] (forward head posture) Thoracic Assessment Thoracic Assessment: Within Functional Limits Lumbar Assessment Lumbar Assessment: Within Functional Limits Postural Control Postural Control: Deficits on evaluation Righting Reactions: delayed Protective Responses: delayed Postural Limitations: WFL   Balance Balance Balance Assessed: Yes Standardized Balance Assessment Standardized Balance Assessment: Berg Balance Test Berg Balance Test Sit to Stand: Able to stand without using hands and stabilize independently Standing Unsupported: Able to stand safely 2 minutes Sitting with Back Unsupported but Feet Supported on Floor or Stool: Able to sit safely and securely 2 minutes Stand to Sit: Sits safely with minimal use of hands Transfers: Able to transfer safely, minor use of hands Standing Unsupported with Eyes Closed: Able to stand 10 seconds safely Standing Ubsupported with Feet Together: Able to place feet together independently and stand 1 minute safely From Standing, Reach Forward with Outstretched Arm: Can reach forward >12 cm safely (5") From Standing Position, Pick up Object from Floor: Able to pick up shoe safely and easily From Standing Position, Turn to Look Behind Over each Shoulder: Turn sideways only but  maintains balance Turn 360 Degrees: Able to turn 360 degrees safely in 4 seconds or less Standing Unsupported, Alternately Place Feet on Step/Stool: Able to complete >2 steps/needs minimal assist Standing Unsupported, One Foot in Front: Loses balance while stepping or standing Standing on One Leg: Unable to try or needs assist to prevent fall Total Score: 42   Extremity/Trunk Assessment RUE Assessment RUE Assessment:  Within Functional Limits LUE Assessment LUE Assessment: Within Functional Limits  See FIM for current functional status  CLAY,PATRICIA 01/22/2014, 8:01 AM

## 2014-01-21 NOTE — Plan of Care (Signed)
Problem: RH Memory Goal: LTG Patient demonstrate ability for day to day recall (PT) LTG: Patient will demonstrate ability for day to day recall/carryover during mobility activities with assist (PT)  Outcome: Not Met (add Reason) Patient requires one verbal cue for hand placement during sit<>stand.

## 2014-01-21 NOTE — Progress Notes (Signed)
Physical Therapy Session Note  Patient Details  Name: Brenda [name] MRN: [id number] Date of Birth: Dec 19, 1930  Today's Date: 01/21/2014 Time: 7078-6754 and 4920-1007 Time Calculation (min): 60 min and 50 min  Short Term Goals: Week 1:  PT Short Term Goal 1 (Week 1): Patient will perform bed mobility with supervision. PT Short Term Goal 2 (Week 1): Patient will perform functional transfers with supervision. PT Short Term Goal 3 (Week 1): Patient will ambulate 150' with LRAD and minA. PT Short Term Goal 4 (Week 1): Patient will negotiate 3 steps with one handrail and minA.  Skilled Therapeutic Interventions/Progress Updates:   AM Session: Pt received sitting in w/c with husband present for session. Focus on community ambulation, basic transfers, and floor transfers. In ADL apartment, patient performed bed mobility and furniture transfers at overall mod I level using RW. Pt did require 1 verbal cue for safe hand placement with sit <> stand transfers. Gait training in controlled environment 2x >500 ft and in home environment x 25 ft using RW with mod I. Gait training in community/outdoor environment 2 x >1000 ft using RW with supervision, including negotiating on/off elevators, mildly crowded hospital lobby and carpeted gift shop, inclines, declines, concrete and brick surfaces, over thresholds, and  negotiating up/down 5 brick outdoor steps laterally using L rail. Reviewed with patient and husband about falls risk at home and indications vs. Contraindications for attempting floor transfer vs. Calling EMS s/p fall and use of sturdy furniture for UE support. Patient performed floor transfer with use of low couch for UE support and supervision, no cues for sequencing. Patient returned to room and made mod I in room with RW, left sitting in w/c with all needs within reach and husband present.   PM Session: Focus on car transfers, stair negotiation, and discharge assessment. Gait training using RW with mod  I in controlled environment 2 x 200 ft and home environment x 75 ft. Pt performed car transfer with supervision, vc's for sequencing with RW. Pt negotiated up/down three 6" stairs sideways using L rail as well as managing RW at top and bottom of stairs. Administered Berg Balance Scale with total score: 42/56, indicating patient is at significant risk for falls (>80%). Pt educated on fall risk and importance of safety with AD upon discharge for mobility. Pt and husband with no further questions/concerns regarding discharge. Pt reporting fatigue from increased activity this date and returned to room, left sitting in bedside chair with husband present.   Therapy Documentation Precautions:  Precautions Precautions: Fall Precaution Comments: R crani Restrictions Weight Bearing Restrictions: No General: Amount of Missed PT Time (min): 10 Minutes Missed Time Reason: Patient fatigue  See FIM for current functional status  Therapy/Group: Individual Therapy  Carney Living A 01/21/2014, 10:00 AM

## 2014-01-21 NOTE — Progress Notes (Signed)
[facility]ICAL [facility]ICINE [facility] RE[facility]ABILITATION     PROGRESS NOTE    Subjective/Complaints: Pain better.  Patient was supervision to modified independent with ADLs this morning. Discussed with OT  Review of systems is negative except for itching along the right scalp incision  Objective: Vital Signs: Blood pressure 150/75, pulse 78, temperature 97.9 F (36.6 C), temperature source Oral, resp. rate 18, height 5\' 3"  (1.6 m), weight 69.673 kg (153 lb 9.6 oz), SpO2 99.00%. No results found. No results found for this basename: WBC, HGB, HCT, PLT,  in the last 72 hours No results found for this basename: NA, K, CL, CO, GLUCOSE, BUN, CREATININE, CALCIUM,  in the last 72 hours CBG (last 3)  No results found for this basename: GLUCAP,  in the last 72 hours  Wt Readings from Last 3 Encounters:  01/16/14 69.673 kg (153 lb 9.6 oz)  01/06/14 66.7 kg (147 lb 0.8 oz)  01/06/14 66.7 kg (147 lb 0.8 oz)    Physical Exam:  Constitutional: She appears well-developed and well-nourished.  HENT: oral mucosa generally pink and moist Craniotomy sites clean and dry  Eyes:  Pupils round and reactive to light without nystagmus  Neck: Normal range of motion. Neck supple. No thyromegaly present.  Cardiovascular: Normal rate and regular rhythm. Exam reveals no friction rub.  No murmur heard.  Respiratory: Effort normal and breath sounds normal. No respiratory distress. She has no wheezes. She has no rales.  GI: Soft. Bowel sounds are normal. She exhibits no distension. There is no tenderness.  Neurological: She is alert.  Patient is oriented to place, age and date of birth, todays date. She follows simple commands. Makes good eye contact with examiner. Borderline insight and awareness. Left facial weakness noted. Mild left inattention. Strength 4/5 RUE 4- LUE. RLE: 3+ to 4/5 proximal to distal. LLE 3- to 4-/5 prox to distal.  Musc: some tenderness along left knee with palpation. Chest wall also tender on  left. Psychiatric: pt is pleasant, less anxious today Skin: incision clean with staples   Assessment/Plan: 1. Functional deficits secondary to right subdural hemorrhage which require 3+ hours per day of interdisciplinary therapy in a comprehensive inpatient rehab setting. Physiatrist is providing close team supervision and 24 hour management of active medical problems listed below. Physiatrist and rehab team continue to assess barriers to discharge/monitor patient progress toward functional and medical goals. Stable for discharge in the morning FIM: FIM - Bathing Bathing Steps Patient Completed: Chest;Right upper leg;Right Arm;Left upper leg;Left Arm;Right lower leg (including foot);Abdomen;Left lower leg (including foot);Front perineal area;Buttocks Bathing: 5: Supervision: Safety issues/verbal cues  FIM - Upper Body Dressing/Undressing Upper body dressing/undressing steps patient completed: Thread/unthread right sleeve of pullover shirt/dresss;Thread/unthread left sleeve of pullover shirt/dress;Put head through opening of pull over shirt/dress;Pull shirt over trunk Upper body dressing/undressing: 5: Supervision: Safety issues/verbal cues FIM - Lower Body Dressing/Undressing Lower body dressing/undressing steps patient completed: Thread/unthread right underwear leg;Thread/unthread left underwear leg;Pull underwear up/down;Thread/unthread right pants leg;Thread/unthread left pants leg;Pull pants up/down;Fasten/unfasten pants;Don/Doff right sock;Don/Doff left sock;Don/Doff right shoe;Don/Doff left shoe Lower body dressing/undressing: 5: Supervision: Safety issues/verbal cues  FIM - Toileting Toileting steps completed by patient: Adjust clothing prior to toileting;Performs perineal hygiene;Adjust clothing after toileting Toileting Assistive Devices: Grab bar or rail for support;Toilet Aid/prosthesis/orthosis Toileting: 6: More than reasonable amount of time  FIM - Sport and exercise psychologist Devices: Bedside commode;Elevated toilet seat;Grab bars Toilet Transfers: 0-Activity did not occur  FIM - Control and instrumentation engineer Devices: [employer]  rests Bed/Chair Transfer: 6: Supine > Sit: No assist;6: Sit > Supine: No assist;6: Chair or W/C > Bed: No assist;6: Bed > Chair or W/C: No assist  FIM - Locomotion: Wheelchair Distance: 70 Locomotion: Wheelchair: 0: Activity did not occur (pt ambulatory) FIM - Locomotion: Ambulation Locomotion: Ambulation Assistive Devices: Administrator Ambulation/Gait Assistance: 6: Modified independent (Device/Increase time) Locomotion: Ambulation: 6: Travels 150 ft or more with assistive device/no helper  Comprehension Comprehension Mode: Auditory Comprehension: 5-Understands complex 90% of the time/Cues < 10% of the time  Expression Expression Mode: Verbal Expression: 5-Expresses complex 90% of the time/cues < 10% of the time  Social Interaction Social Interaction: 5-Interacts appropriately 90% of the time - Needs monitoring or encouragement for participation or interaction.  Problem Solving Problem Solving: 5-Solves complex 90% of the time/cues < 10% of the time  Memory Memory: 4-Recognizes or recalls 75 - 89% of the time/requires cueing 10 - 24% of the time  Medical Problem List and Plan:  1. Functional deficits secondary to right subdural hematoma status post craniotomy 01/04/2014 with recurrent SDH and repeat craniotomy x2   -can remove staples today 2. DVT Prophylaxis/Anticoagulation: SCDs.   3. Pain Management: Hydrocodone as needed. Added lidoderm patches for chest wall pain 4. seizure prophylaxis. Keppra 1000 mg every 12 hours, Dilantin 100 mg every 8 hours. EEG negative  5. Neuropsych: This patient is not capable of making decisions on her own behalf.  6. Dysphagia. Dysphagia 3 nectar liquids. Monitor for any signs of aspiration. Followup speech therapy  7. Hypothyroidism.  Synthroid  LOS (Days) 5 A FACE TO FACE EVALUATION WAS PERFORMED  Charlett Blake 01/21/2014 4:41 PM

## 2014-01-21 NOTE — Progress Notes (Signed)
Physical Therapy Discharge Summary  Patient Details  Name: Brenda [name] MRN: [id number] Date of Birth: August 25, 1930  Today's Date: 01/22/2014  Patient has met 10 of 11 long term goals due to improved activity tolerance, improved balance, improved postural control, ability to compensate for deficits, improved attention, improved awareness and improved coordination.  Patient to discharge at an ambulatory level Modified Independent.   Patient's care partner not necessary secondary to patient discharging at mod I level to provide the necessary assistance at discharge.  Reasons goals not met: Patient did not meet floor transfer goal of mod I, requires supervision for balance/safety  Recommendation:  Patient will benefit from ongoing skilled PT services in home health setting to continue to advance safe functional mobility, address ongoing impairments in balance, strength, activity tolerance, overall functional mobility, and minimize fall risk.  Equipment: No equipment provided  Reasons for discharge: treatment goals met and discharge from hospital  Patient/family agrees with progress made and goals achieved: Yes  Please see PT treatment note from 01/21/14 for patient's CLOF, BERG Balance Score, and all functional mobility details.  PT Discharge Precautions/Restrictions Precautions Precautions: None Precaution Comments: R crani Restrictions Weight Bearing Restrictions: No Vision/Perception    Wears reading glasses; Cataracts surgery Praxis and Perception WFL Cognition Overall Cognitive Status: Impaired/Different from baseline Arousal/Alertness: Awake/alert Orientation Level: Oriented X4 Attention: Sustained;Selective Sustained Attention: Impaired Sustained Attention Impairment: Functional basic;Verbal basic Selective Attention: Impaired Selective Attention Impairment: Verbal basic;Functional basic Memory: Impaired Memory Impairment: Decreased short term memory;Decreased recall of  new information Decreased Short Term Memory: Verbal basic;Functional basic Awareness: Impaired Awareness Impairment: Anticipatory impairment;Emergent impairment Problem Solving: Impaired Problem Solving Impairment: Functional complex;Verbal complex Executive Function: Sequencing;Organizing;Initiating;Self Monitoring;Self Correcting Self Monitoring: Impaired Self Monitoring Impairment: Functional basic;Verbal basic Self Correcting: Impaired Self Correcting Impairment: Functional basic Safety/Judgment: Impaired [employer] Scales of Cognitive Functioning: Purposeful/appropriate Sensation Sensation Light Touch: Appears Intact Proprioception: Appears Intact Coordination Gross Motor Movements are Fluid and Coordinated: Yes Fine Motor Movements are Fluid and Coordinated: Yes Motor  Motor Motor: Within Functional Limits  Trunk/Postural Assessment  Cervical Assessment Cervical Assessment: Exceptions to [facility] (forward head posture) Thoracic Assessment Thoracic Assessment: Within Functional Limits Lumbar Assessment Lumbar Assessment: Within Functional Limits Postural Control Postural Control: Deficits on evaluation Righting Reactions: delayed Protective Responses: delayed Postural Limitations: WFL  Extremity Assessment  RLE Assessment RLE Assessment: Exceptions to [facility] RLE Strength RLE Overall Strength: Deficits RLE Overall Strength Comments: Overall WFL except hip flexion 3+/5 LLE Assessment LLE Assessment: Exceptions to [facility] LLE Strength LLE Overall Strength: Deficits LLE Overall Strength Comments: Overall WFL except hip flexion 3+/5  See FIM for current functional status  Bridget S Ripa Bridget S. Ripa, PT, DPT 01/21/2014, 9:35 PM

## 2014-01-21 NOTE — Progress Notes (Signed)
Occupational Therapy Session Note  Patient Details  Name: DEETYA DROUILLARD MRN: [id number] Date of Birth: 25-Feb-1931  Today's Date: 01/21/2014 Time: 1300-1340 Time Calculation (min): 40 min  Short Term Goals: Week 1:  OT Short Term Goal 1 (Week 1): Pt will complete toilet transfer with min assist and min cues OT Short Term Goal 2 (Week 1): Pt will complete LB dressing with min assist  OT Short Term Goal 3 (Week 1): Pt will attend to self-care task ~20 minutes with min cues  OT Short Term Goal 4 (Week 1): Pt will maintain dynamic standing balance ~5 minutes during self-care task with mod assist  OT Short Term Goal 5 (Week 1): Pt will demonstrate recall/carry over of functional transfers with min cues   Skilled Therapeutic Interventions/Progress Updates:  Patient received seated in bed side chair with husband present. Patient stood with RW and ambulated from room > ortho gym with distant supervision. Engaged patient in therapeutic activity (client centered approach of playing simulated game of tennis) focusing on dynamic sitting balance/tolerance/endurance, functional use of BUEs, and overall activity tolerance/endurance. Patient ambulated back to room and therapist left patient seated in bed side chair with all needs within reach. Patient nor husband had any questions regarding d/c tomorrow.   Precautions:  Precautions Precautions: Fall Precaution Comments: R crani Restrictions Weight Bearing Restrictions: No  See FIM for current functional status  Therapy/Group: Individual Therapy  CLAY,PATRICIA 01/21/2014, 1:44 PM

## 2014-01-21 NOTE — Progress Notes (Signed)
Occupational Therapy Session Note  Patient Details  Name: Brenda [name] MRN: [id number] Date of Birth: September 14, 1930  Today's Date: 01/21/2014 Time: 1000-1100 Time Calculation (min): 60 min  Short Term Goals: Week 1:  OT Short Term Goal 1 (Week 1): Pt will complete toilet transfer with min assist and min cues OT Short Term Goal 2 (Week 1): Pt will complete LB dressing with min assist  OT Short Term Goal 3 (Week 1): Pt will attend to self-care task ~20 minutes with min cues  OT Short Term Goal 4 (Week 1): Pt will maintain dynamic standing balance ~5 minutes during self-care task with mod assist  OT Short Term Goal 5 (Week 1): Pt will demonstrate recall/carry over of functional transfers with min cues   Skilled Therapeutic Interventions/Progress Updates:  Patient resting in w/c upon arrival with husband in the room seated in recliner.  Engaged in self care retraining to include shower, dress, groom and simple HM tasks at a walker level.  Focused session on preparing for discharge tomorrow to include increased safety and independence with all aspects of BADL & IADL.  Patient was made Mod I this morning for ambulating with RW to and from the toilet and overall supervision for all BADL & IADL.  Patient required mod-min reminders to lock breaks before stand and sit therefore w/c was replaced with straight back chair since she won't be using a w/c in the house at home.  Patient also required min-mod cues for walker safety to include placing a hand on a stable surface instead of the walker when reaching for items on the floor and keeping the walker close during tasks instead of putting it aside.  Husband agrees and will provide the reminders needed as well as let other family know.  Therapy Documentation Precautions:  Precautions Precautions: Fall Precaution Comments: R crani Restrictions Weight Bearing Restrictions: No Pain: No report of pain ADL: See FIM for current functional  status  Therapy/Group: Individual Therapy  SHAFFER, [place] 01/21/2014, 11:25 AM
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
## 2014-01-21 NOTE — Discharge Summary (Signed)
Discharge summary job # [id number]

## 2014-01-21 NOTE — Discharge Instructions (Signed)
Inpatient Rehab Discharge Instructions  Brenda [name] Discharge date and time: No discharge date for patient encounter.   Activities/Precautions/ Functional Status: Activity: activity as tolerated Diet: Dysphagia 2 nectar liquids Wound Care: keep wound clean and dry Functional status:  ___ No restrictions     ___ Walk up steps independently _x__ 24/7 supervision/assistance   ___ Walk up steps with assistance ___ Intermittent supervision/assistance  ___ Bathe/dress independently ___ Walk with walker     ___ Bathe/dress with assistance ___ Walk Independently    ___ Shower independently _x__ Walk with assistance    ___ Shower with assistance ___ No alcohol     ___ Return to work/school ________     COMMUNITY REFERRALS UPON DISCHARGE:    Home Health:   PT     OT     ST                     Agency: [facility] Phone: [phone]    GENERAL COMMUNITY RESOURCES FOR PATIENT/FAMILY:  Support Groups: Brain Injury Support Group    Caregiver Support: same      Special Instructions:    My questions have been answered and I understand these instructions. I will adhere to these goals and the provided educational materials after my discharge from the hospital.  Patient/Caregiver Signature _______________________________ Date __________  Clinician Signature _______________________________________ Date __________  Please bring this form and your medication list with you to all your follow-up doctor's appointments.

## 2014-01-22 DIAGNOSIS — S069XAA Unspecified intracranial injury with loss of consciousness status unknown, initial encounter: Secondary | ICD-10-CM

## 2014-01-22 DIAGNOSIS — D869 Sarcoidosis, unspecified: Secondary | ICD-10-CM

## 2014-01-22 DIAGNOSIS — I10 Essential (primary) hypertension: Secondary | ICD-10-CM

## 2014-01-22 DIAGNOSIS — I2510 Atherosclerotic heart disease of native coronary artery without angina pectoris: Secondary | ICD-10-CM

## 2014-01-22 DIAGNOSIS — W19XXXA Unspecified fall, initial encounter: Secondary | ICD-10-CM

## 2014-01-22 DIAGNOSIS — S069X9A Unspecified intracranial injury with loss of consciousness of unspecified duration, initial encounter: Secondary | ICD-10-CM

## 2014-01-22 LAB — PHENYTOIN LEVEL, TOTAL: Phenytoin Lvl: 9.9 ug/mL — ABNORMAL LOW (ref 10.0–20.0)

## 2014-01-22 MED ORDER — LEVOTHYROXINE SODIUM 75 MCG PO TABS: 75.0000 ug | ORAL_TABLET | Freq: Every day | ORAL | Status: DC

## 2014-01-22 MED ORDER — HYDROCODONE-ACETAMINOPHEN 5-325 MG PO TABS: 1.0000 | ORAL_TABLET | ORAL | Status: DC | PRN

## 2014-01-22 MED ORDER — LEVETIRACETAM 1000 MG PO TABS: 1000.0000 mg | ORAL_TABLET | Freq: Two times a day (BID) | ORAL | Status: DC

## 2014-01-22 MED ORDER — AMLODIPINE BESYLATE 5 MG PO TABS: 5.0000 mg | ORAL_TABLET | Freq: Every day | ORAL | Status: DC

## 2014-01-22 MED ORDER — PHENYTOIN SODIUM EXTENDED 100 MG PO CAPS: 100.0000 mg | ORAL_CAPSULE | Freq: Three times a day (TID) | ORAL | Status: DC

## 2014-01-22 MED ORDER — LEVETIRACETAM 100 MG/ML PO SOLN: 1000.0000 mg | Freq: Two times a day (BID) | ORAL | Status: DC

## 2014-01-22 MED ORDER — LEVETIRACETAM 500 MG PO TABS
1000.0000 mg | ORAL_TABLET | Freq: Two times a day (BID) | ORAL | Status: DC
  Filled 2014-01-22 (×3): qty 2

## 2014-01-22 MED ORDER — LIDOCAINE 5 % EX PTCH: 2.0000 | MEDICATED_PATCH | CUTANEOUS | Status: DC

## 2014-01-22 NOTE — Plan of Care (Signed)
Problem: RH Floor Transfers Goal: LTG Patient will perform floor transfers w/assist (PT) LTG: Patient will perform floor transfers with assistance (PT).  Outcome: Not Met (add Reason) Patient requires supervision for balance/safety.

## 2014-01-22 NOTE — Progress Notes (Signed)
Pt discharged to home at 36 with husband. Discharge instructions given by Marlowe Sh[place], PA with verbal understanding. Pt and husband have no further questions at this time.

## 2014-01-22 NOTE — Progress Notes (Signed)
Social Work  Discharge Note  The overall goal for the admission was met for:   Discharge location: Yes - home with husband providing 24/7 supervision  Length of Stay: Yes - 5 days  Discharge activity level: Yes - supervision  Home/community participation: Yes  Services provided included: MD, RD, PT, OT, SLP, RN, TR, Pharmacy and [place]: Medicare and Private Insurance: [place]  Follow-up services arranged: Home Health: PT, OT, ST via [place] and Patient/Family request agency HH: [facility], DME: NA  Comments (or additional information):  Patient/Family verbalized understanding of follow-up arrangements: Yes  Individual responsible for coordination of the follow-up plan: patient  Confirmed correct DME delivered: NA - had all needed DME    HOYLE, LUCY
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
## 2014-01-22 NOTE — Progress Notes (Signed)
[facility] PHYSICAL MEDICINE & REHABILITATION     PROGRESS NOTE    Subjective/Complaints: Slept poorly but no other c/os  Review of systems is negative except for itching along the right scalp incision  Objective: Vital Signs: Blood pressure 138/66, pulse 73, temperature 98.3 F (36.8 C), temperature source Oral, resp. rate 18, height 5\' 3"  (1.6 m), weight 69.673 kg (153 lb 9.6 oz), SpO2 94.00%. No results found. No results found for this basename: WBC, HGB, HCT, PLT,  in the last 72 hours No results found for this basename: NA, K, CL, CO, GLUCOSE, BUN, CREATININE, CALCIUM,  in the last 72 hours CBG (last 3)  No results found for this basename: GLUCAP,  in the last 72 hours  Wt Readings from Last 3 Encounters:  01/16/14 69.673 kg (153 lb 9.6 oz)  01/06/14 66.7 kg (147 lb 0.8 oz)  01/06/14 66.7 kg (147 lb 0.8 oz)    Physical Exam:  Constitutional: She appears well-developed and well-nourished.  HENT: oral mucosa generally pink and moist Craniotomy sites clean and dry  Eyes:  Pupils round and reactive to light without nystagmus  Neck: Normal range of motion. Neck supple. No thyromegaly present.  Cardiovascular: Normal rate and regular rhythm. Exam reveals no friction rub.  No murmur heard.  Respiratory: Effort normal and breath sounds normal. No respiratory distress. She has no wheezes. She has no rales.  GI: Soft. Bowel sounds are normal. She exhibits no distension. There is no tenderness.  Neurological: She is alert.  Patient is oriented to place, age and date of birth, todays date. She follows simple commands. Makes good eye contact with examiner. Borderline insight and awareness. Left facial weakness noted. Mild left inattention. Strength 4/5 RUE 4- LUE. RLE: 3+ to 4/5 proximal to distal. LLE 3- to 4-/5 prox to distal.  Musc: some tenderness along left knee with palpation. Chest wall also tender on left. Psychiatric: pt is pleasant, less anxious  today    Assessment/Plan: 1. Functional deficits secondary to right subdural hemorrhage which require 3+ hours per day of interdisciplinary therapy in a comprehensive inpatient rehab setting. Stable for D/C today F/u PCP in 1-2 weeks F/u PM&R 3 weeks See D/C summary See D/C instructions FIM: FIM - Bathing Bathing Steps Patient Completed: Chest;Right upper leg;Right Arm;Left upper leg;Left Arm;Right lower leg (including foot);Abdomen;Left lower leg (including foot);Front perineal area;Buttocks Bathing: 5: Supervision: Safety issues/verbal cues  FIM - Upper Body Dressing/Undressing Upper body dressing/undressing steps patient completed: Thread/unthread right sleeve of pullover shirt/dresss;Thread/unthread left sleeve of pullover shirt/dress;Put head through opening of pull over shirt/dress;Pull shirt over trunk Upper body dressing/undressing: 5: Supervision: Safety issues/verbal cues FIM - Lower Body Dressing/Undressing Lower body dressing/undressing steps patient completed: Thread/unthread right underwear leg;Thread/unthread left underwear leg;Pull underwear up/down;Thread/unthread right pants leg;Thread/unthread left pants leg;Pull pants up/down;Fasten/unfasten pants;Don/Doff right sock;Don/Doff left sock;Don/Doff right shoe;Don/Doff left shoe Lower body dressing/undressing: 5: Supervision: Safety issues/verbal cues  FIM - Toileting Toileting steps completed by patient: Adjust clothing prior to toileting;Performs perineal hygiene;Adjust clothing after toileting Toileting Assistive Devices: Grab bar or rail for support Toileting: 6: Assistive device: No helper  FIM - Radio producer Devices: Grab bars;Walker Toilet Transfers: 5-To toilet/BSC: Supervision (verbal cues/safety issues);5-From toilet/BSC: Supervision (verbal cues/safety issues)  FIM - Control and instrumentation engineer Devices: Walker;Arm rests Bed/Chair Transfer: 0: Activity did  not occur  FIM - Locomotion: Wheelchair Distance: 70 Locomotion: Wheelchair: 0: Activity did not occur (pt ambulatory) FIM - Locomotion: Ambulation Locomotion: Ambulation Assistive Devices: Environmental consultant -  Rolling Ambulation/Gait Assistance: 6: Modified independent (Device/Increase time) Locomotion: Ambulation: 6: Travels 150 ft or more with assistive device/no helper  Comprehension Comprehension Mode: Auditory Comprehension: 5-Understands basic 90% of the time/requires cueing < 10% of the time  Expression Expression Mode: Verbal Expression: 6-Expresses complex ideas: With extra time/assistive device  Social Interaction Social Interaction: 5-Interacts appropriately 90% of the time - Needs monitoring or encouragement for participation or interaction.  Problem Solving Problem Solving: 5-Solves complex 90% of the time/cues < 10% of the time  Memory Memory: 5-Recognizes or recalls 90% of the time/requires cueing < 10% of the time  Medical Problem List and Plan:  1. Functional deficits secondary to right subdural hematoma status post craniotomy 01/04/2014 with recurrent SDH and repeat craniotomy x2   -can remove staples today 2. DVT Prophylaxis/Anticoagulation: SCDs.   3. Pain Management: Hydrocodone as needed. Added lidoderm patches for chest wall pain 4. seizure prophylaxis. Keppra 1000 mg every 12 hours, Dilantin 100 mg every 8 hours. EEG negative  5. Neuropsych: This patient is not capable of making decisions on her own behalf.  6. Dysphagia. Dysphagia 3 nectar liquids. Monitor for any signs of aspiration. Followup speech therapy  7. Hypothyroidism. Synthroid  LOS (Days) 6 A FACE TO FACE EVALUATION WAS PERFORMED  Charlett Blake 01/22/2014 8:56 AM

## 2014-01-23 ENCOUNTER — Telehealth: Payer: Self-pay | Admitting: *Deleted

## 2014-01-23 DIAGNOSIS — IMO0001 Reserved for inherently not codable concepts without codable children: Secondary | ICD-10-CM | POA: Diagnosis not present

## 2014-01-23 DIAGNOSIS — S3609XA Other injury of spleen, initial encounter: Secondary | ICD-10-CM | POA: Diagnosis not present

## 2014-01-23 DIAGNOSIS — I10 Essential (primary) hypertension: Secondary | ICD-10-CM | POA: Diagnosis not present

## 2014-01-23 DIAGNOSIS — S065X6A Traumatic subdural hemorrhage with loss of consciousness greater than 24 hours without return to pre-existing conscious level with patient surviving, initial encounter: Secondary | ICD-10-CM | POA: Diagnosis not present

## 2014-01-23 NOTE — Telephone Encounter (Signed)
Called about some medications that Brenda [name] was taking previously and is not listed on the discharge sheet.  She has not been seen in the office and they are not listed in the discharge summary, so I have given her the pager of Terie Purser PA who discharged her form the rehab unit to answer her questions.

## 2014-01-24 DIAGNOSIS — IMO0001 Reserved for inherently not codable concepts without codable children: Secondary | ICD-10-CM | POA: Diagnosis not present

## 2014-01-24 DIAGNOSIS — S3609XA Other injury of spleen, initial encounter: Secondary | ICD-10-CM | POA: Diagnosis not present

## 2014-01-24 DIAGNOSIS — I10 Essential (primary) hypertension: Secondary | ICD-10-CM | POA: Diagnosis not present

## 2014-01-24 DIAGNOSIS — S065X6A Traumatic subdural hemorrhage with loss of consciousness greater than 24 hours without return to pre-existing conscious level with patient surviving, initial encounter: Secondary | ICD-10-CM | POA: Diagnosis not present

## 2014-01-25 DIAGNOSIS — IMO0001 Reserved for inherently not codable concepts without codable children: Secondary | ICD-10-CM | POA: Diagnosis not present

## 2014-01-25 DIAGNOSIS — I10 Essential (primary) hypertension: Secondary | ICD-10-CM | POA: Diagnosis not present

## 2014-01-25 DIAGNOSIS — S065X6A Traumatic subdural hemorrhage with loss of consciousness greater than 24 hours without return to pre-existing conscious level with patient surviving, initial encounter: Secondary | ICD-10-CM | POA: Diagnosis not present

## 2014-01-25 DIAGNOSIS — S3609XA Other injury of spleen, initial encounter: Secondary | ICD-10-CM | POA: Diagnosis not present

## 2014-01-28 ENCOUNTER — Telehealth: Payer: Self-pay | Admitting: *Deleted

## 2014-01-28 DIAGNOSIS — S065X6A Traumatic subdural hemorrhage with loss of consciousness greater than 24 hours without return to pre-existing conscious level with patient surviving, initial encounter: Secondary | ICD-10-CM | POA: Diagnosis not present

## 2014-01-28 DIAGNOSIS — S3609XA Other injury of spleen, initial encounter: Secondary | ICD-10-CM | POA: Diagnosis not present

## 2014-01-28 DIAGNOSIS — IMO0001 Reserved for inherently not codable concepts without codable children: Secondary | ICD-10-CM | POA: Diagnosis not present

## 2014-01-28 DIAGNOSIS — I10 Essential (primary) hypertension: Secondary | ICD-10-CM | POA: Diagnosis not present

## 2014-01-28 NOTE — Telephone Encounter (Signed)
FYI only--Brenda [name] [facility] called to notify us that there will be a delay in the start of Brenda [name]'s speech therapy.  Brenda [name] requested next week start because of other appointments she has scheduled.
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
## 2014-01-29 DIAGNOSIS — S065X6A Traumatic subdural hemorrhage with loss of consciousness greater than 24 hours without return to pre-existing conscious level with patient surviving, initial encounter: Secondary | ICD-10-CM | POA: Diagnosis not present

## 2014-01-29 DIAGNOSIS — S3609XA Other injury of spleen, initial encounter: Secondary | ICD-10-CM | POA: Diagnosis not present

## 2014-01-29 DIAGNOSIS — I10 Essential (primary) hypertension: Secondary | ICD-10-CM | POA: Diagnosis not present

## 2014-01-29 DIAGNOSIS — IMO0001 Reserved for inherently not codable concepts without codable children: Secondary | ICD-10-CM | POA: Diagnosis not present

## 2014-01-30 DIAGNOSIS — IMO0001 Reserved for inherently not codable concepts without codable children: Secondary | ICD-10-CM | POA: Diagnosis not present

## 2014-01-30 DIAGNOSIS — S065X6A Traumatic subdural hemorrhage with loss of consciousness greater than 24 hours without return to pre-existing conscious level with patient surviving, initial encounter: Secondary | ICD-10-CM | POA: Diagnosis not present

## 2014-01-30 DIAGNOSIS — I10 Essential (primary) hypertension: Secondary | ICD-10-CM | POA: Diagnosis not present

## 2014-01-30 DIAGNOSIS — S3609XA Other injury of spleen, initial encounter: Secondary | ICD-10-CM | POA: Diagnosis not present

## 2014-01-30 DIAGNOSIS — I6200 Nontraumatic subdural hemorrhage, unspecified: Secondary | ICD-10-CM | POA: Diagnosis not present

## 2014-01-30 DIAGNOSIS — G40909 Epilepsy, unspecified, not intractable, without status epilepticus: Secondary | ICD-10-CM | POA: Diagnosis not present

## 2014-01-31 DIAGNOSIS — I10 Essential (primary) hypertension: Secondary | ICD-10-CM | POA: Diagnosis not present

## 2014-01-31 DIAGNOSIS — IMO0001 Reserved for inherently not codable concepts without codable children: Secondary | ICD-10-CM | POA: Diagnosis not present

## 2014-01-31 DIAGNOSIS — S065X6A Traumatic subdural hemorrhage with loss of consciousness greater than 24 hours without return to pre-existing conscious level with patient surviving, initial encounter: Secondary | ICD-10-CM | POA: Diagnosis not present

## 2014-01-31 DIAGNOSIS — S3609XA Other injury of spleen, initial encounter: Secondary | ICD-10-CM | POA: Diagnosis not present

## 2014-02-01 DIAGNOSIS — I10 Essential (primary) hypertension: Secondary | ICD-10-CM | POA: Diagnosis not present

## 2014-02-01 DIAGNOSIS — S065X6A Traumatic subdural hemorrhage with loss of consciousness greater than 24 hours without return to pre-existing conscious level with patient surviving, initial encounter: Secondary | ICD-10-CM | POA: Diagnosis not present

## 2014-02-01 DIAGNOSIS — IMO0001 Reserved for inherently not codable concepts without codable children: Secondary | ICD-10-CM | POA: Diagnosis not present

## 2014-02-01 DIAGNOSIS — S3609XA Other injury of spleen, initial encounter: Secondary | ICD-10-CM | POA: Diagnosis not present

## 2014-02-05 DIAGNOSIS — I10 Essential (primary) hypertension: Secondary | ICD-10-CM | POA: Diagnosis not present

## 2014-02-05 DIAGNOSIS — IMO0001 Reserved for inherently not codable concepts without codable children: Secondary | ICD-10-CM | POA: Diagnosis not present

## 2014-02-05 DIAGNOSIS — S065X6A Traumatic subdural hemorrhage with loss of consciousness greater than 24 hours without return to pre-existing conscious level with patient surviving, initial encounter: Secondary | ICD-10-CM | POA: Diagnosis not present

## 2014-02-05 DIAGNOSIS — S3609XA Other injury of spleen, initial encounter: Secondary | ICD-10-CM | POA: Diagnosis not present

## 2014-02-06 ENCOUNTER — Other Ambulatory Visit: Payer: Self-pay | Admitting: Neurosurgery

## 2014-02-06 DIAGNOSIS — I6200 Nontraumatic subdural hemorrhage, unspecified: Secondary | ICD-10-CM

## 2014-02-07 DIAGNOSIS — S065X6A Traumatic subdural hemorrhage with loss of consciousness greater than 24 hours without return to pre-existing conscious level with patient surviving, initial encounter: Secondary | ICD-10-CM | POA: Diagnosis not present

## 2014-02-07 DIAGNOSIS — IMO0001 Reserved for inherently not codable concepts without codable children: Secondary | ICD-10-CM | POA: Diagnosis not present

## 2014-02-07 DIAGNOSIS — I10 Essential (primary) hypertension: Secondary | ICD-10-CM | POA: Diagnosis not present

## 2014-02-07 DIAGNOSIS — S3609XA Other injury of spleen, initial encounter: Secondary | ICD-10-CM | POA: Diagnosis not present

## 2014-02-08 DIAGNOSIS — S3609XA Other injury of spleen, initial encounter: Secondary | ICD-10-CM | POA: Diagnosis not present

## 2014-02-08 DIAGNOSIS — S065X6A Traumatic subdural hemorrhage with loss of consciousness greater than 24 hours without return to pre-existing conscious level with patient surviving, initial encounter: Secondary | ICD-10-CM | POA: Diagnosis not present

## 2014-02-08 DIAGNOSIS — IMO0001 Reserved for inherently not codable concepts without codable children: Secondary | ICD-10-CM | POA: Diagnosis not present

## 2014-02-08 DIAGNOSIS — I10 Essential (primary) hypertension: Secondary | ICD-10-CM | POA: Diagnosis not present

## 2014-02-11 DIAGNOSIS — S065X6A Traumatic subdural hemorrhage with loss of consciousness greater than 24 hours without return to pre-existing conscious level with patient surviving, initial encounter: Secondary | ICD-10-CM | POA: Diagnosis not present

## 2014-02-11 DIAGNOSIS — I10 Essential (primary) hypertension: Secondary | ICD-10-CM | POA: Diagnosis not present

## 2014-02-11 DIAGNOSIS — S3609XA Other injury of spleen, initial encounter: Secondary | ICD-10-CM | POA: Diagnosis not present

## 2014-02-11 DIAGNOSIS — IMO0001 Reserved for inherently not codable concepts without codable children: Secondary | ICD-10-CM | POA: Diagnosis not present

## 2014-02-11 DIAGNOSIS — E890 Postprocedural hypothyroidism: Secondary | ICD-10-CM | POA: Diagnosis not present

## 2014-02-12 ENCOUNTER — Ambulatory Visit
Admission: RE | Admit: 2014-02-12 | Discharge: 2014-02-12 | Disposition: A | Discharge status: Home / Self Care | Payer: Medicare Other | Source: Ambulatory Visit | Attending: Neurosurgery | Admitting: Neurosurgery

## 2014-02-12 DIAGNOSIS — IMO0001 Reserved for inherently not codable concepts without codable children: Secondary | ICD-10-CM | POA: Diagnosis not present

## 2014-02-12 DIAGNOSIS — S3609XA Other injury of spleen, initial encounter: Secondary | ICD-10-CM | POA: Diagnosis not present

## 2014-02-12 DIAGNOSIS — S065X6A Traumatic subdural hemorrhage with loss of consciousness greater than 24 hours without return to pre-existing conscious level with patient surviving, initial encounter: Secondary | ICD-10-CM | POA: Diagnosis not present

## 2014-02-12 DIAGNOSIS — I6200 Nontraumatic subdural hemorrhage, unspecified: Secondary | ICD-10-CM

## 2014-02-12 DIAGNOSIS — I10 Essential (primary) hypertension: Secondary | ICD-10-CM | POA: Diagnosis not present

## 2014-02-13 DIAGNOSIS — S3609XA Other injury of spleen, initial encounter: Secondary | ICD-10-CM | POA: Diagnosis not present

## 2014-02-13 DIAGNOSIS — I10 Essential (primary) hypertension: Secondary | ICD-10-CM | POA: Diagnosis not present

## 2014-02-13 DIAGNOSIS — IMO0001 Reserved for inherently not codable concepts without codable children: Secondary | ICD-10-CM | POA: Diagnosis not present

## 2014-02-13 DIAGNOSIS — S065X6A Traumatic subdural hemorrhage with loss of consciousness greater than 24 hours without return to pre-existing conscious level with patient surviving, initial encounter: Secondary | ICD-10-CM | POA: Diagnosis not present

## 2014-02-14 DIAGNOSIS — S3609XA Other injury of spleen, initial encounter: Secondary | ICD-10-CM | POA: Diagnosis not present

## 2014-02-14 DIAGNOSIS — IMO0001 Reserved for inherently not codable concepts without codable children: Secondary | ICD-10-CM | POA: Diagnosis not present

## 2014-02-14 DIAGNOSIS — I10 Essential (primary) hypertension: Secondary | ICD-10-CM | POA: Diagnosis not present

## 2014-02-14 DIAGNOSIS — S065X6A Traumatic subdural hemorrhage with loss of consciousness greater than 24 hours without return to pre-existing conscious level with patient surviving, initial encounter: Secondary | ICD-10-CM | POA: Diagnosis not present

## 2014-02-27 DIAGNOSIS — I6200 Nontraumatic subdural hemorrhage, unspecified: Secondary | ICD-10-CM | POA: Diagnosis not present

## 2014-02-28 ENCOUNTER — Telehealth: Payer: Self-pay | Admitting: *Deleted

## 2014-02-28 NOTE — Telephone Encounter (Signed)
May stop dilantin. Needs to continue keppra until follow up visit with me

## 2014-02-28 NOTE — Telephone Encounter (Signed)
Contacted patient to inform her that per Dr.Swartz said she could stop the Dilantin and continue Keppra until follow up visit.

## 2014-02-28 NOTE — Telephone Encounter (Signed)
Brenda [name] called to ask if she can stop the Keppra and Dilantin that Linna Hoff put her on at discharge because of 2 seizures while in hospital(?).  She saw Dr Hal Neer yesterday and had a scan and it was ok and he dismissed her from his care.

## 2014-03-18 DIAGNOSIS — I6200 Nontraumatic subdural hemorrhage, unspecified: Secondary | ICD-10-CM | POA: Diagnosis not present

## 2014-03-18 DIAGNOSIS — M47812 Spondylosis without myelopathy or radiculopathy, cervical region: Secondary | ICD-10-CM | POA: Diagnosis not present

## 2014-03-18 DIAGNOSIS — M542 Cervicalgia: Secondary | ICD-10-CM | POA: Diagnosis not present

## 2014-03-18 DIAGNOSIS — R51 Headache: Secondary | ICD-10-CM | POA: Diagnosis not present

## 2014-03-19 ENCOUNTER — Other Ambulatory Visit (HOSPITAL_COMMUNITY): Payer: Self-pay | Admitting: Anesthesiology

## 2014-03-19 DIAGNOSIS — I350 Nonrheumatic aortic (valve) stenosis: Secondary | ICD-10-CM

## 2014-03-25 DIAGNOSIS — E890 Postprocedural hypothyroidism: Secondary | ICD-10-CM | POA: Diagnosis not present

## 2014-03-25 DIAGNOSIS — Z8781 Personal history of (healed) traumatic fracture: Secondary | ICD-10-CM | POA: Diagnosis not present

## 2014-03-27 ENCOUNTER — Inpatient Hospital Stay: Payer: Medicare Other | Admitting: Physical Medicine & Rehabilitation

## 2014-04-02 ENCOUNTER — Telehealth: Payer: Self-pay | Admitting: Neurology

## 2014-04-02 ENCOUNTER — Ambulatory Visit: Payer: Medicare Other | Admitting: Neurology

## 2014-04-02 NOTE — Telephone Encounter (Signed)
Referring Dr was told by phone call about cancellation

## 2014-04-02 NOTE — Telephone Encounter (Signed)
Pt cancelled new patient appt

## 2014-05-09 DIAGNOSIS — E2839 Other primary ovarian failure: Secondary | ICD-10-CM | POA: Diagnosis not present

## 2014-05-09 DIAGNOSIS — Z780 Asymptomatic menopausal state: Secondary | ICD-10-CM | POA: Diagnosis not present

## 2014-05-10 ENCOUNTER — Other Ambulatory Visit: Payer: Self-pay

## 2014-05-13 DIAGNOSIS — I6203 Nontraumatic chronic subdural hemorrhage: Secondary | ICD-10-CM | POA: Diagnosis not present

## 2014-05-14 ENCOUNTER — Other Ambulatory Visit: Payer: Self-pay | Admitting: Neurosurgery

## 2014-05-14 DIAGNOSIS — S065X9A Traumatic subdural hemorrhage with loss of consciousness of unspecified duration, initial encounter: Secondary | ICD-10-CM

## 2014-05-14 DIAGNOSIS — S065XAA Traumatic subdural hemorrhage with loss of consciousness status unknown, initial encounter: Secondary | ICD-10-CM

## 2014-05-15 ENCOUNTER — Ambulatory Visit
Admission: RE | Admit: 2014-05-15 | Discharge: 2014-05-15 | Disposition: A | Discharge status: Home / Self Care | Payer: Medicare Other | Source: Ambulatory Visit | Attending: Neurosurgery | Admitting: Neurosurgery

## 2014-05-15 DIAGNOSIS — I6200 Nontraumatic subdural hemorrhage, unspecified: Secondary | ICD-10-CM | POA: Diagnosis not present

## 2014-05-15 DIAGNOSIS — S065XAA Traumatic subdural hemorrhage with loss of consciousness status unknown, initial encounter: Secondary | ICD-10-CM

## 2014-05-15 DIAGNOSIS — S065X9A Traumatic subdural hemorrhage with loss of consciousness of unspecified duration, initial encounter: Secondary | ICD-10-CM

## 2014-05-20 DIAGNOSIS — I6203 Nontraumatic chronic subdural hemorrhage: Secondary | ICD-10-CM | POA: Diagnosis not present

## 2014-05-20 DIAGNOSIS — I6200 Nontraumatic subdural hemorrhage, unspecified: Secondary | ICD-10-CM | POA: Diagnosis not present

## 2014-05-22 DIAGNOSIS — Z23 Encounter for immunization: Secondary | ICD-10-CM | POA: Diagnosis not present

## 2014-05-22 DIAGNOSIS — H8110 Benign paroxysmal vertigo, unspecified ear: Secondary | ICD-10-CM | POA: Diagnosis not present

## 2014-06-10 DIAGNOSIS — L570 Actinic keratosis: Secondary | ICD-10-CM | POA: Diagnosis not present

## 2014-06-10 DIAGNOSIS — L821 Other seborrheic keratosis: Secondary | ICD-10-CM | POA: Diagnosis not present

## 2014-06-10 DIAGNOSIS — D485 Neoplasm of uncertain behavior of skin: Secondary | ICD-10-CM | POA: Diagnosis not present

## 2014-06-10 DIAGNOSIS — D225 Melanocytic nevi of trunk: Secondary | ICD-10-CM | POA: Diagnosis not present

## 2014-07-24 ENCOUNTER — Other Ambulatory Visit: Payer: Self-pay | Admitting: Internal Medicine

## 2014-07-24 MED ORDER — AMLODIPINE BESYLATE 5 MG PO TABS: 5.0000 mg | ORAL_TABLET | Freq: Every day | ORAL | Status: DC

## 2014-08-12 DIAGNOSIS — L814 Other melanin hyperpigmentation: Secondary | ICD-10-CM | POA: Diagnosis not present

## 2014-08-12 DIAGNOSIS — L821 Other seborrheic keratosis: Secondary | ICD-10-CM | POA: Diagnosis not present

## 2014-08-12 DIAGNOSIS — D225 Melanocytic nevi of trunk: Secondary | ICD-10-CM | POA: Diagnosis not present

## 2014-08-12 DIAGNOSIS — L570 Actinic keratosis: Secondary | ICD-10-CM | POA: Diagnosis not present

## 2014-09-26 ENCOUNTER — Telehealth: Payer: Self-pay

## 2014-09-26 MED ORDER — AMLODIPINE BESYLATE 5 MG PO TABS: 5.0000 mg | ORAL_TABLET | Freq: Every day | ORAL | Status: DC

## 2014-09-26 NOTE — Telephone Encounter (Signed)
Refill sent in

## 2014-11-04 DIAGNOSIS — Z0000 Encounter for general adult medical examination without abnormal findings: Secondary | ICD-10-CM | POA: Diagnosis not present

## 2014-11-24 ENCOUNTER — Other Ambulatory Visit: Payer: Self-pay | Admitting: Cardiology

## 2014-12-19 ENCOUNTER — Encounter: Payer: Self-pay | Admitting: Internal Medicine

## 2014-12-19 DIAGNOSIS — I788 Other diseases of capillaries: Secondary | ICD-10-CM | POA: Diagnosis not present

## 2014-12-19 DIAGNOSIS — L570 Actinic keratosis: Secondary | ICD-10-CM | POA: Diagnosis not present

## 2014-12-19 DIAGNOSIS — L821 Other seborrheic keratosis: Secondary | ICD-10-CM | POA: Diagnosis not present

## 2015-01-03 DIAGNOSIS — Q253 Supravalvular aortic stenosis: Secondary | ICD-10-CM | POA: Diagnosis not present

## 2015-01-03 DIAGNOSIS — E890 Postprocedural hypothyroidism: Secondary | ICD-10-CM | POA: Diagnosis not present

## 2015-01-03 DIAGNOSIS — E876 Hypokalemia: Secondary | ICD-10-CM | POA: Diagnosis not present

## 2015-01-03 DIAGNOSIS — I10 Essential (primary) hypertension: Secondary | ICD-10-CM | POA: Diagnosis not present

## 2015-01-03 DIAGNOSIS — E780 Pure hypercholesterolemia: Secondary | ICD-10-CM | POA: Diagnosis not present

## 2015-01-30 ENCOUNTER — Other Ambulatory Visit: Payer: Self-pay | Admitting: Internal Medicine

## 2015-02-25 DIAGNOSIS — H35033 Hypertensive retinopathy, bilateral: Secondary | ICD-10-CM | POA: Diagnosis not present

## 2015-02-25 DIAGNOSIS — Z961 Presence of intraocular lens: Secondary | ICD-10-CM | POA: Diagnosis not present

## 2015-02-25 DIAGNOSIS — H40013 Open angle with borderline findings, low risk, bilateral: Secondary | ICD-10-CM | POA: Diagnosis not present

## 2015-02-25 DIAGNOSIS — M3501 Sicca syndrome with keratoconjunctivitis: Secondary | ICD-10-CM | POA: Diagnosis not present

## 2015-03-25 ENCOUNTER — Other Ambulatory Visit: Payer: Self-pay | Admitting: Internal Medicine

## 2015-04-01 DIAGNOSIS — Z1231 Encounter for screening mammogram for malignant neoplasm of breast: Secondary | ICD-10-CM | POA: Diagnosis not present

## 2015-04-18 DIAGNOSIS — M47892 Other spondylosis, cervical region: Secondary | ICD-10-CM | POA: Diagnosis not present

## 2015-04-18 DIAGNOSIS — M542 Cervicalgia: Secondary | ICD-10-CM | POA: Diagnosis not present

## 2015-04-18 DIAGNOSIS — R51 Headache: Secondary | ICD-10-CM | POA: Diagnosis not present

## 2015-04-18 DIAGNOSIS — Z8679 Personal history of other diseases of the circulatory system: Secondary | ICD-10-CM | POA: Diagnosis not present

## 2015-04-25 DIAGNOSIS — R350 Frequency of micturition: Secondary | ICD-10-CM | POA: Diagnosis not present

## 2015-04-25 DIAGNOSIS — Z23 Encounter for immunization: Secondary | ICD-10-CM | POA: Diagnosis not present

## 2015-05-20 ENCOUNTER — Other Ambulatory Visit: Payer: Self-pay | Admitting: Internal Medicine

## 2015-07-04 DIAGNOSIS — D1801 Hemangioma of skin and subcutaneous tissue: Secondary | ICD-10-CM | POA: Diagnosis not present

## 2015-07-04 DIAGNOSIS — D2271 Melanocytic nevi of right lower limb, including hip: Secondary | ICD-10-CM | POA: Diagnosis not present

## 2015-07-04 DIAGNOSIS — D2261 Melanocytic nevi of right upper limb, including shoulder: Secondary | ICD-10-CM | POA: Diagnosis not present

## 2015-07-04 DIAGNOSIS — D2262 Melanocytic nevi of left upper limb, including shoulder: Secondary | ICD-10-CM | POA: Diagnosis not present

## 2015-07-04 DIAGNOSIS — L814 Other melanin hyperpigmentation: Secondary | ICD-10-CM | POA: Diagnosis not present

## 2015-07-04 DIAGNOSIS — L821 Other seborrheic keratosis: Secondary | ICD-10-CM | POA: Diagnosis not present

## 2015-07-04 DIAGNOSIS — I8392 Asymptomatic varicose veins of left lower extremity: Secondary | ICD-10-CM | POA: Diagnosis not present

## 2015-07-04 DIAGNOSIS — D2239 Melanocytic nevi of other parts of face: Secondary | ICD-10-CM | POA: Diagnosis not present

## 2015-07-04 DIAGNOSIS — D225 Melanocytic nevi of trunk: Secondary | ICD-10-CM | POA: Diagnosis not present

## 2015-07-04 DIAGNOSIS — D485 Neoplasm of uncertain behavior of skin: Secondary | ICD-10-CM | POA: Diagnosis not present

## 2015-07-04 DIAGNOSIS — I8391 Asymptomatic varicose veins of right lower extremity: Secondary | ICD-10-CM | POA: Diagnosis not present

## 2015-07-08 DIAGNOSIS — H5319 Other subjective visual disturbances: Secondary | ICD-10-CM | POA: Diagnosis not present

## 2015-07-14 ENCOUNTER — Other Ambulatory Visit: Payer: Self-pay | Admitting: Internal Medicine

## 2015-07-17 DIAGNOSIS — E039 Hypothyroidism, unspecified: Secondary | ICD-10-CM | POA: Diagnosis not present

## 2015-08-07 ENCOUNTER — Encounter (INDEPENDENT_AMBULATORY_CARE_PROVIDER_SITE_OTHER): Payer: Medicare Other | Admitting: Ophthalmology

## 2015-08-07 DIAGNOSIS — H35033 Hypertensive retinopathy, bilateral: Secondary | ICD-10-CM

## 2015-08-07 DIAGNOSIS — H35372 Puckering of macula, left eye: Secondary | ICD-10-CM

## 2015-08-07 DIAGNOSIS — H43813 Vitreous degeneration, bilateral: Secondary | ICD-10-CM | POA: Diagnosis not present

## 2015-08-07 DIAGNOSIS — I10 Essential (primary) hypertension: Secondary | ICD-10-CM | POA: Diagnosis not present

## 2015-08-20 DIAGNOSIS — H43812 Vitreous degeneration, left eye: Secondary | ICD-10-CM | POA: Diagnosis not present

## 2015-08-20 DIAGNOSIS — H35422 Microcystoid degeneration of retina, left eye: Secondary | ICD-10-CM | POA: Diagnosis not present

## 2015-08-20 DIAGNOSIS — H43821 Vitreomacular adhesion, right eye: Secondary | ICD-10-CM | POA: Diagnosis not present

## 2015-08-20 DIAGNOSIS — H35371 Puckering of macula, right eye: Secondary | ICD-10-CM | POA: Diagnosis not present
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
## 2015-09-04 ENCOUNTER — Other Ambulatory Visit: Payer: Self-pay | Admitting: Internal Medicine

## 2015-09-23 DIAGNOSIS — H43813 Vitreous degeneration, bilateral: Secondary | ICD-10-CM | POA: Diagnosis not present

## 2015-09-23 DIAGNOSIS — H35371 Puckering of macula, right eye: Secondary | ICD-10-CM | POA: Diagnosis not present

## 2015-10-01 DIAGNOSIS — H35423 Microcystoid degeneration of retina, bilateral: Secondary | ICD-10-CM | POA: Diagnosis not present

## 2015-10-01 DIAGNOSIS — H35371 Puckering of macula, right eye: Secondary | ICD-10-CM | POA: Diagnosis not present

## 2015-10-01 DIAGNOSIS — H35362 Drusen (degenerative) of macula, left eye: Secondary | ICD-10-CM | POA: Diagnosis not present

## 2015-10-01 DIAGNOSIS — H43813 Vitreous degeneration, bilateral: Secondary | ICD-10-CM | POA: Diagnosis not present

## 2015-10-11 ENCOUNTER — Other Ambulatory Visit: Payer: Self-pay | Admitting: Cardiology

## 2016-02-12 DIAGNOSIS — M542 Cervicalgia: Secondary | ICD-10-CM | POA: Diagnosis not present

## 2016-02-12 DIAGNOSIS — E782 Mixed hyperlipidemia: Secondary | ICD-10-CM | POA: Diagnosis not present

## 2016-02-12 DIAGNOSIS — Q253 Supravalvular aortic stenosis: Secondary | ICD-10-CM | POA: Diagnosis not present

## 2016-02-12 DIAGNOSIS — E890 Postprocedural hypothyroidism: Secondary | ICD-10-CM | POA: Diagnosis not present

## 2016-02-12 DIAGNOSIS — N189 Chronic kidney disease, unspecified: Secondary | ICD-10-CM | POA: Diagnosis not present

## 2016-02-12 DIAGNOSIS — I10 Essential (primary) hypertension: Secondary | ICD-10-CM | POA: Diagnosis not present

## 2016-03-26 ENCOUNTER — Ambulatory Visit (HOSPITAL_COMMUNITY)
Admission: RE | Admit: 2016-03-26 | Discharge: 2016-03-26 | Disposition: A | Discharge status: Home / Self Care | Payer: Medicare Other | Source: Ambulatory Visit | Attending: Internal Medicine | Admitting: Internal Medicine

## 2016-03-26 ENCOUNTER — Encounter (HOSPITAL_COMMUNITY): Payer: Self-pay | Admitting: Internal Medicine

## 2016-03-26 VITALS — BP 178/80 | HR 98 | Ht 63.0 in | Wt 142.8 lb

## 2016-03-26 DIAGNOSIS — I2510 Atherosclerotic heart disease of native coronary artery without angina pectoris: Secondary | ICD-10-CM | POA: Diagnosis not present

## 2016-03-26 DIAGNOSIS — K219 Gastro-esophageal reflux disease without esophagitis: Secondary | ICD-10-CM | POA: Insufficient documentation

## 2016-03-26 DIAGNOSIS — I110 Hypertensive heart disease with heart failure: Secondary | ICD-10-CM | POA: Diagnosis not present

## 2016-03-26 DIAGNOSIS — I10 Essential (primary) hypertension: Secondary | ICD-10-CM | POA: Insufficient documentation

## 2016-03-26 DIAGNOSIS — I5032 Chronic diastolic (congestive) heart failure: Secondary | ICD-10-CM | POA: Insufficient documentation

## 2016-03-26 DIAGNOSIS — I350 Nonrheumatic aortic (valve) stenosis: Secondary | ICD-10-CM | POA: Insufficient documentation

## 2016-03-26 DIAGNOSIS — D869 Sarcoidosis, unspecified: Secondary | ICD-10-CM | POA: Diagnosis not present

## 2016-03-26 DIAGNOSIS — F419 Anxiety disorder, unspecified: Secondary | ICD-10-CM | POA: Diagnosis not present

## 2016-03-26 DIAGNOSIS — K589 Irritable bowel syndrome without diarrhea: Secondary | ICD-10-CM | POA: Diagnosis not present

## 2016-03-26 DIAGNOSIS — M1990 Unspecified osteoarthritis, unspecified site: Secondary | ICD-10-CM | POA: Diagnosis not present

## 2016-03-26 DIAGNOSIS — E785 Hyperlipidemia, unspecified: Secondary | ICD-10-CM | POA: Diagnosis not present

## 2016-03-26 DIAGNOSIS — R0600 Dyspnea, unspecified: Secondary | ICD-10-CM

## 2016-03-26 DIAGNOSIS — Z79899 Other long term (current) drug therapy: Secondary | ICD-10-CM | POA: Insufficient documentation

## 2016-03-26 DIAGNOSIS — J45909 Unspecified asthma, uncomplicated: Secondary | ICD-10-CM | POA: Insufficient documentation

## 2016-03-26 NOTE — Patient Instructions (Signed)
Will refer you to [facility] for general cardiology. Address: [address] ([address] Floor), [place], [place] [address]  Phone: [phone]  Will schedule you for an echocardiogram at [facility]. Address: [address] ([place]), [place],  [address]  Phone: [phone]
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
## 2016-04-02 ENCOUNTER — Telehealth: Payer: Self-pay

## 2016-04-02 NOTE — Telephone Encounter (Signed)
Rec'd from [place] forward 27 pages to Endo Historical Provider

## 2016-04-07 NOTE — Addendum Note (Signed)
Encounter addended by: Scarlette Calico, RN on: 04/07/2016  2:11 PM<BR>    Actions taken: Order Entry activity accessed, Diagnosis association updated

## 2016-04-21 ENCOUNTER — Other Ambulatory Visit: Payer: Self-pay

## 2016-04-21 ENCOUNTER — Ambulatory Visit (HOSPITAL_COMMUNITY): Payer: Medicare Other | Attending: Cardiovascular Disease

## 2016-04-21 DIAGNOSIS — D869 Sarcoidosis, unspecified: Secondary | ICD-10-CM | POA: Diagnosis not present

## 2016-04-21 DIAGNOSIS — E785 Hyperlipidemia, unspecified: Secondary | ICD-10-CM | POA: Diagnosis not present

## 2016-04-21 DIAGNOSIS — I059 Rheumatic mitral valve disease, unspecified: Secondary | ICD-10-CM | POA: Insufficient documentation

## 2016-04-21 DIAGNOSIS — I110 Hypertensive heart disease with heart failure: Secondary | ICD-10-CM | POA: Diagnosis not present

## 2016-04-21 DIAGNOSIS — I350 Nonrheumatic aortic (valve) stenosis: Secondary | ICD-10-CM | POA: Insufficient documentation

## 2016-04-21 DIAGNOSIS — I509 Heart failure, unspecified: Secondary | ICD-10-CM | POA: Diagnosis not present

## 2016-04-21 DIAGNOSIS — R0600 Dyspnea, unspecified: Secondary | ICD-10-CM | POA: Insufficient documentation

## 2016-04-23 ENCOUNTER — Encounter: Payer: Self-pay | Admitting: Internal Medicine

## 2016-04-29 DIAGNOSIS — M542 Cervicalgia: Secondary | ICD-10-CM | POA: Diagnosis not present

## 2016-04-29 DIAGNOSIS — H9202 Otalgia, left ear: Secondary | ICD-10-CM | POA: Diagnosis not present

## 2016-04-29 DIAGNOSIS — H903 Sensorineural hearing loss, bilateral: Secondary | ICD-10-CM | POA: Diagnosis not present

## 2016-05-04 ENCOUNTER — Other Ambulatory Visit: Payer: Self-pay | Admitting: Otolaryngology

## 2016-05-04 DIAGNOSIS — H903 Sensorineural hearing loss, bilateral: Secondary | ICD-10-CM

## 2016-05-04 DIAGNOSIS — M542 Cervicalgia: Secondary | ICD-10-CM

## 2016-05-04 DIAGNOSIS — H9202 Otalgia, left ear: Secondary | ICD-10-CM

## 2016-05-10 ENCOUNTER — Encounter: Payer: Self-pay | Admitting: Internal Medicine

## 2016-05-10 ENCOUNTER — Ambulatory Visit (INDEPENDENT_AMBULATORY_CARE_PROVIDER_SITE_OTHER): Payer: Medicare Other | Admitting: Internal Medicine

## 2016-05-10 VITALS — BP 160/86 | HR 76 | Ht 63.0 in | Wt 144.0 lb

## 2016-05-10 DIAGNOSIS — I10 Essential (primary) hypertension: Secondary | ICD-10-CM | POA: Diagnosis not present

## 2016-05-10 DIAGNOSIS — I350 Nonrheumatic aortic (valve) stenosis: Secondary | ICD-10-CM | POA: Diagnosis not present

## 2016-05-10 NOTE — Progress Notes (Signed)
Cardiology Office Note   Date:  05/10/2016   ID:  Sharri, Parrado 1931-05-20, MRN [id number]  PCP:  [name], MD  Cardiologist:   [name], MD   F/U of CAD and diastolic dysfunction   History of Present Illness: Brenda [name] is a 82 y.o. female with a history of mild to mod CAD by cath in [id number], diastolic dysfunction, HL, midl AS and sarcoid  SHe has been seen by D Bensimhon. Seen this September   Echo done in September LVEF was normal  Gr I diastolic dysfunction; Moderate to severe AS with a mean gradient of 32 mm Hg   The pt is very talkative  She denies CP  Breathing is OK  NO dizziness   She is not taking Losartan  Read SE  Was concerned.        Outpatient Medications Prior to Visit  Medication Sig Dispense Refill  . ALPRAZolam (XANAX) 0.5 MG tablet Take 0.5 mg by mouth.    . ALREX 0.2 % SUSP Place 1 drop into both eyes 2 (two) times daily.     Marland Kitchen amLODipine (NORVASC) 5 MG tablet TAKE 1 TABLET BY MOUTH EVERY DAY (NEED APPT) 15 tablet 0  . Biotin 5000 MCG TABS Take 5,000 mcg by mouth daily.     . Cholecalciferol (VITAMIN D3) 1000 UNITS CAPS Take 1,000 Units by mouth daily.     . cycloSPORINE (RESTASIS) 0.05 % ophthalmic emulsion Place 1 drop into both eyes every 12 (twelve) hours.     Marland Kitchen glucosamine-chondroitin 500-400 MG tablet Take 1 tablet by mouth 3 (three) times daily.    Marland Kitchen levothyroxine (SYNTHROID, LEVOTHROID) 75 MCG tablet Take 1 tablet (75 mcg total) by mouth daily. 30 tablet 1  . losartan (COZAAR) 25 MG tablet Take 25 mg by mouth daily.    . Multiple Vitamin (MULTIVITAMIN) capsule Take 1 capsule by mouth daily.      . Omega-3 Fatty Acids (FISH OIL) 1200 MG CAPS Take 1,200 mg by mouth daily.     . pantoprazole (PROTONIX) 40 MG tablet Take 40 mg by mouth daily.    . pravastatin (PRAVACHOL) 80 MG tablet Take 80 mg by mouth daily.     Marland Kitchen HYDROcodone-acetaminophen (NORCO/VICODIN) 5-325 MG per tablet Take 1 tablet by mouth every 4 (four) hours as needed  for moderate pain. (Patient not taking: Reported on 05/10/2016) 90 tablet 0  . lidocaine (LIDODERM) 5 % Place 2 patches onto the skin daily. Remove & Discard patch within 12 hours or as directed by MD (Patient not taking: Reported on 05/10/2016) 30 patch 0  . senna (SENOKOT) 8.6 MG tablet Take 3 tablets by mouth at bedtime.      No facility-administered medications prior to visit.      Allergies:   Review of patient's allergies indicates no known allergies.   Past Medical History:  Diagnosis Date  . Anxiety   . Asthma   . Barrett's esophagus   . DJD (degenerative joint disease)   . GERD (gastroesophageal reflux disease)   . Goiter   . Hemorrhoids   . History of colonoscopy   . History of shingles    face/eye  . Hx of colonic polyps   . Hyperlipidemia   . Hypertension   . IBS (irritable bowel syndrome)   . Osteoarthritis   . Sarcoidosis ([place])   . [facility])     Past Surgical History:  Procedure Laterality Date  . BREAST LUMPECTOMY  1974  left  . CRANIOTOMY Right 01/04/2014   Procedure: CRANIOTOMY HEMATOMA EVACUATION SUBDURAL;  Surgeon: [name], MD;  Location: [place] NEURO ORS;  Service: Neurosurgery;  Laterality: Right;  . CRANIOTOMY N/A 01/08/2014   Procedure: Redo CRANIOTOMY HEMATOMA EVACUATION SUBDURAL RIGHT;  Surgeon: [name], MD;  Location: [place] NEURO ORS;  Service: Neurosurgery;  Laterality: N/A;  Redo CRANIOTOMY HEMATOMA EVACUATION SUBDURAL RIGHT  . CRANIOTOMY Right 01/10/2014   Procedure: Redo CRANIOTOMY HEMATOMA EVACUATION SUBDURAL;  Surgeon: [name], MD;  Location: [place] NEURO ORS;  Service: Neurosurgery;  Laterality: Right;  . DILATION AND CURETTAGE OF UTERUS    . NASAL SINUS SURGERY    . NECK SURGERY     x 2  . TONSILLECTOMY AND ADENOIDECTOMY  1976  . TOTAL THYROIDECTOMY  06-05-08     Social History:  The patient  reports that she has never smoked. She has never used smokeless tobacco. She reports that she drinks alcohol. She reports that she  does not use drugs.   Family History:  The patient's family history includes Asthma in her sister; Colon cancer in her sister; Heart disease in her father; Heart failure in her father; Hyperlipidemia in her father and mother; Hypertension in her brother, father, mother, and sister; Kidney cancer in her mother; Lymphoma in her mother; Rheum arthritis in her father; Thyroid cancer in her sister.    ROS:  Please see the history of present illness. All other systems are reviewed and  Negative to the above problem except as noted.    PHYSICAL EXAM: VS:  BP (!) 160/86   Pulse 76   Ht 5\' 3"  (1.6 m)   Wt 144 lb (65.3 kg)   BMI 25.51 kg/m   GEN: Well nourished, well developed, in no acute distress  HEENT: normal  Neck: no JVD, carotid bruits, or masses Cardiac: RRR;  Gr III/VI systolic murmur  No rubs, or gallops,no edema  Respiratory:  clear to auscultation bilaterally, normal work of breathing GI: soft, nontender, nondistended, + BS  No hepatomegaly  MS: no deformity Moving all extremities   Skin: warm and dry, no rash Neuro:  Strength and sensation are intact Psych: euthymic mood, full affect   EKG:  EKG is ordered today.  SR 76 bpm     Lipid Panel No results found for: CHOL, TRIG, HDL, CHOLHDL, VLDL, LDLCALC, LDLDIRECT    Wt Readings from Last 3 Encounters:  05/10/16 144 lb (65.3 kg)  03/26/16 142 lb 12.8 oz (64.8 kg)  01/16/14 153 lb 9.6 oz (69.7 kg)      ASSESSMENT AND PLAN: 1  Aortic stenosis  Will set up for echo in AUgust prior to next clinic   2  CAD  No symptoms to sugg angina    3  HTN  BP is up  WOuld follow  Was better at other visits   Will review records from Dr [name]    4  Diastolic Dysfunctoin  Volume status is OK  Follow  Watch salt        Current medicines are reviewed at length with the patient today.  The patient does not have concerns regarding medicines.  Signed, [name], MD  05/10/2016 10:36 AM    [address], [place], [place]  [address] Phone: [phone]; Fax: [phone]

## 2016-05-10 NOTE — Patient Instructions (Signed)
Your physician recommends that you continue on your current medications as directed. Please refer to the Current Medication list given to you today. Your physician wants you to follow-up in: August, 2018 with Dr. Harrington Challenger. You will receive a reminder letter in the mail two months in advance. If you don't receive a letter, please call our office to schedule the follow-up appointment. Your physician has requested that you have an echocardiogram. Echocardiography is a painless test that uses sound waves to create images of your heart. It provides your doctor with information about the size and shape of your heart and how well your heart's chambers and valves are working. This procedure takes approximately one hour. There are no restrictions for this procedure. (PLEASE SCHEDULE ECHO FOR NEXT AUGUST BEFORE YOU RETURN TO SEE DR. Harrington Challenger)

## 2016-05-11 ENCOUNTER — Ambulatory Visit
Admission: RE | Admit: 2016-05-11 | Discharge: 2016-05-11 | Disposition: A | Discharge status: Home / Self Care | Payer: Medicare Other | Source: Ambulatory Visit | Attending: Otolaryngology | Admitting: Otolaryngology

## 2016-05-11 DIAGNOSIS — H903 Sensorineural hearing loss, bilateral: Secondary | ICD-10-CM

## 2016-05-11 DIAGNOSIS — M542 Cervicalgia: Secondary | ICD-10-CM

## 2016-05-11 DIAGNOSIS — H9202 Otalgia, left ear: Secondary | ICD-10-CM

## 2016-05-11 MED ORDER — IOPAMIDOL (ISOVUE-300) INJECTION 61%
50.0000 mL | Freq: Once | INTRAVENOUS | Status: AC | PRN
  Administered 2016-05-11: 50 mL via INTRAVENOUS

## 2016-05-14 ENCOUNTER — Telehealth: Payer: Self-pay | Admitting: Internal Medicine

## 2016-05-14 NOTE — Telephone Encounter (Signed)
New message      Calling to let Dr Harrington Challenger know that pt had her bp cuff/machine checked at her PCP's office and the matched.  They got the same readings.  Also, wanted to remind Dr Harrington Challenger that she was going to call pt regarding her medications----stop or continue with them.

## 2016-05-16 NOTE — Telephone Encounter (Signed)
Need to see what BP was in Dr Eddie Dibbles office  ? High or low

## 2016-05-18 NOTE — Telephone Encounter (Signed)
Patient's daughter Lynelle Smoke returned my call.   Patient did not keep her readings from recent blood pressures.   She thinks patient has started keeping track after they saw PCP and compared cuffs.   Daughter will find out from patient and will call me back Wed or Thurs.  Also,  Called back to Dr. Eddie Dibbles office to request any recent lab work, to include kidney function.  They will fax labs.

## 2016-05-18 NOTE — Telephone Encounter (Signed)
Called Dr. Eddie Dibbles office.  Pt's BP on 10/19 was 156/78 by the nurse and 158/77 with her own cuff Left message for daughter Lynelle Smoke to call back to find out what her recent home BP readings are.

## 2016-06-07 DIAGNOSIS — M542 Cervicalgia: Secondary | ICD-10-CM | POA: Insufficient documentation

## 2016-06-11 ENCOUNTER — Telehealth: Payer: Self-pay | Admitting: *Deleted

## 2016-06-11 DIAGNOSIS — E785 Hyperlipidemia, unspecified: Secondary | ICD-10-CM

## 2016-06-11 MED ORDER — ROSUVASTATIN CALCIUM 40 MG PO TABS: 40.0000 mg | ORAL_TABLET | Freq: Every day | ORAL | 3 refills | Status: DC

## 2016-06-11 NOTE — Telephone Encounter (Signed)
-----   Message from Fay Records, MD sent at 06/06/2016 10:24 PM EST ----- Received lipids from [facility].  LDL was 133.   Recomm:  Stop pravastatin   Start Crestor 40 mg  Check lpids and AST in 8 wks.

## 2016-06-11 NOTE — Telephone Encounter (Signed)
Spoke with patient. She will stop pravastatin and start Crestor.  Lab appointment made on Jan 16 for LIPIDS and AST.

## 2016-07-09 ENCOUNTER — Encounter: Payer: Self-pay | Admitting: Internal Medicine

## 2016-07-09 ENCOUNTER — Ambulatory Visit: Payer: Medicare Other | Admitting: Internal Medicine

## 2016-07-09 ENCOUNTER — Ambulatory Visit (INDEPENDENT_AMBULATORY_CARE_PROVIDER_SITE_OTHER): Payer: Medicare Other | Admitting: Internal Medicine

## 2016-07-09 VITALS — BP 106/54 | HR 63 | Ht 63.0 in | Wt 137.0 lb

## 2016-07-09 DIAGNOSIS — E890 Postprocedural hypothyroidism: Secondary | ICD-10-CM | POA: Diagnosis not present

## 2016-07-09 MED ORDER — SYNTHROID 88 MCG PO TABS: 88.0000 ug | ORAL_TABLET | Freq: Every day | ORAL | 1 refills | Status: DC
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
## 2016-07-09 NOTE — Patient Instructions (Signed)
Please stop Biotin for 5 days, then come back for labs.  Please continue Synthroid 88 mcg daily.  Take the thyroid hormone every day, with water, at least 30 minutes before breakfast, separated by at least 4 hours from: - acid reflux medications - calcium - iron - multivitamins  Please return in 6 months.

## 2016-07-13 DIAGNOSIS — M545 Low back pain: Secondary | ICD-10-CM | POA: Diagnosis not present

## 2016-07-13 DIAGNOSIS — N189 Chronic kidney disease, unspecified: Secondary | ICD-10-CM | POA: Diagnosis not present

## 2016-07-13 DIAGNOSIS — E782 Mixed hyperlipidemia: Secondary | ICD-10-CM | POA: Diagnosis not present

## 2016-07-16 ENCOUNTER — Other Ambulatory Visit (INDEPENDENT_AMBULATORY_CARE_PROVIDER_SITE_OTHER): Payer: Medicare Other

## 2016-07-16 DIAGNOSIS — E890 Postprocedural hypothyroidism: Secondary | ICD-10-CM

## 2016-07-16 LAB — T3, FREE: T3 FREE: 3.1 pg/mL (ref 2.3–4.2)

## 2016-07-16 LAB — TSH: TSH: 0.36 u[IU]/mL (ref 0.35–4.50)

## 2016-07-16 LAB — T4, FREE: FREE T4: 1.62 ng/dL — AB (ref 0.60–1.60)

## 2016-07-20 DIAGNOSIS — N189 Chronic kidney disease, unspecified: Secondary | ICD-10-CM | POA: Diagnosis not present

## 2016-07-20 DIAGNOSIS — I700 Atherosclerosis of aorta: Secondary | ICD-10-CM | POA: Diagnosis not present

## 2016-07-20 DIAGNOSIS — N289 Disorder of kidney and ureter, unspecified: Secondary | ICD-10-CM | POA: Diagnosis not present

## 2016-07-30 DIAGNOSIS — R8290 Unspecified abnormal findings in urine: Secondary | ICD-10-CM | POA: Diagnosis not present

## 2016-07-30 DIAGNOSIS — M542 Cervicalgia: Secondary | ICD-10-CM | POA: Diagnosis not present

## 2016-07-30 DIAGNOSIS — M549 Dorsalgia, unspecified: Secondary | ICD-10-CM | POA: Diagnosis not present

## 2016-07-30 DIAGNOSIS — N289 Disorder of kidney and ureter, unspecified: Secondary | ICD-10-CM | POA: Diagnosis not present

## 2016-08-05 ENCOUNTER — Telehealth: Payer: Self-pay | Admitting: Internal Medicine

## 2016-08-05 NOTE — Telephone Encounter (Signed)
°  New Prob   Calling requesting to speak to a nurse. States she is unsure why pt is scheduled for fasting lab work on 1/16. Please call.

## 2016-08-05 NOTE — Telephone Encounter (Signed)
Spoke with patient's daughter, Lynelle Smoke (DPR on file) Stated patient was not sure what lab appointment was for.  Advised of medication change on 11/17 from pravastatin to Cresotr.  Pt usually has labs at [facility] and if possible will get them there if other labs are also due.   She will try to coordinate and will call back.  Advised we can send a script to her to take with her to [place].  Tammy will call back to let me know.

## 2016-08-10 ENCOUNTER — Other Ambulatory Visit: Payer: Medicare Other | Admitting: *Deleted

## 2016-08-10 DIAGNOSIS — I2510 Atherosclerotic heart disease of native coronary artery without angina pectoris: Secondary | ICD-10-CM | POA: Diagnosis not present

## 2016-08-10 DIAGNOSIS — E7800 Pure hypercholesterolemia, unspecified: Secondary | ICD-10-CM | POA: Diagnosis not present

## 2016-08-10 DIAGNOSIS — I359 Nonrheumatic aortic valve disorder, unspecified: Secondary | ICD-10-CM | POA: Diagnosis not present

## 2016-08-11 LAB — LIPID PANEL
CHOLESTEROL TOTAL: 147 mg/dL (ref 100–199)
Chol/HDL Ratio: 2.6 ratio units (ref 0.0–4.4)
HDL: 56 mg/dL (ref >39)
LDL Calculated: 59 mg/dL (ref 0–99)
TRIGLYCERIDES: 161 mg/dL — AB (ref 0–149)
VLDL CHOLESTEROL CAL: 32 mg/dL (ref 5–40)

## 2016-08-11 LAB — AST: AST: 23 IU/L (ref 0–40)

## 2016-08-19 DIAGNOSIS — E785 Hyperlipidemia, unspecified: Secondary | ICD-10-CM | POA: Diagnosis not present

## 2016-08-19 DIAGNOSIS — N183 Chronic kidney disease, stage 3 (moderate): Secondary | ICD-10-CM | POA: Diagnosis not present

## 2016-08-19 DIAGNOSIS — I129 Hypertensive chronic kidney disease with stage 1 through stage 4 chronic kidney disease, or unspecified chronic kidney disease: Secondary | ICD-10-CM | POA: Diagnosis not present

## 2016-08-19 DIAGNOSIS — K219 Gastro-esophageal reflux disease without esophagitis: Secondary | ICD-10-CM | POA: Diagnosis not present

## 2016-09-06 DIAGNOSIS — N183 Chronic kidney disease, stage 3 (moderate): Secondary | ICD-10-CM | POA: Diagnosis not present

## 2016-09-09 DIAGNOSIS — R195 Other fecal abnormalities: Secondary | ICD-10-CM | POA: Diagnosis not present

## 2016-09-09 DIAGNOSIS — M542 Cervicalgia: Secondary | ICD-10-CM | POA: Diagnosis not present

## 2016-09-09 DIAGNOSIS — M25551 Pain in right hip: Secondary | ICD-10-CM | POA: Diagnosis not present

## 2016-09-16 DIAGNOSIS — D860 Sarcoidosis of lung: Secondary | ICD-10-CM | POA: Diagnosis not present

## 2016-09-16 DIAGNOSIS — E785 Hyperlipidemia, unspecified: Secondary | ICD-10-CM | POA: Diagnosis not present

## 2016-09-16 DIAGNOSIS — I129 Hypertensive chronic kidney disease with stage 1 through stage 4 chronic kidney disease, or unspecified chronic kidney disease: Secondary | ICD-10-CM | POA: Diagnosis not present

## 2016-09-16 DIAGNOSIS — N183 Chronic kidney disease, stage 3 (moderate): Secondary | ICD-10-CM | POA: Diagnosis not present

## 2016-09-22 DIAGNOSIS — M25551 Pain in right hip: Secondary | ICD-10-CM | POA: Diagnosis not present

## 2016-09-22 DIAGNOSIS — R509 Fever, unspecified: Secondary | ICD-10-CM | POA: Diagnosis not present

## 2016-09-22 DIAGNOSIS — M542 Cervicalgia: Secondary | ICD-10-CM | POA: Diagnosis not present

## 2016-09-22 DIAGNOSIS — R8290 Unspecified abnormal findings in urine: Secondary | ICD-10-CM | POA: Diagnosis not present

## 2016-09-22 DIAGNOSIS — D649 Anemia, unspecified: Secondary | ICD-10-CM | POA: Diagnosis not present

## 2016-09-30 DIAGNOSIS — D649 Anemia, unspecified: Secondary | ICD-10-CM | POA: Diagnosis not present

## 2016-10-07 DIAGNOSIS — Z981 Arthrodesis status: Secondary | ICD-10-CM | POA: Diagnosis not present

## 2016-10-07 DIAGNOSIS — M5030 Other cervical disc degeneration, unspecified cervical region: Secondary | ICD-10-CM | POA: Diagnosis not present

## 2016-10-07 DIAGNOSIS — M5136 Other intervertebral disc degeneration, lumbar region: Secondary | ICD-10-CM | POA: Diagnosis not present

## 2016-11-17 DIAGNOSIS — N183 Chronic kidney disease, stage 3 (moderate): Secondary | ICD-10-CM | POA: Diagnosis not present

## 2016-11-17 DIAGNOSIS — R600 Localized edema: Secondary | ICD-10-CM | POA: Diagnosis not present

## 2016-11-17 DIAGNOSIS — I129 Hypertensive chronic kidney disease with stage 1 through stage 4 chronic kidney disease, or unspecified chronic kidney disease: Secondary | ICD-10-CM | POA: Diagnosis not present

## 2016-11-30 DIAGNOSIS — N183 Chronic kidney disease, stage 3 (moderate): Secondary | ICD-10-CM | POA: Diagnosis not present

## 2016-12-14 DIAGNOSIS — F419 Anxiety disorder, unspecified: Secondary | ICD-10-CM | POA: Diagnosis not present

## 2016-12-14 DIAGNOSIS — I129 Hypertensive chronic kidney disease with stage 1 through stage 4 chronic kidney disease, or unspecified chronic kidney disease: Secondary | ICD-10-CM | POA: Diagnosis not present

## 2016-12-14 DIAGNOSIS — N183 Chronic kidney disease, stage 3 (moderate): Secondary | ICD-10-CM | POA: Diagnosis not present

## 2016-12-24 DIAGNOSIS — I129 Hypertensive chronic kidney disease with stage 1 through stage 4 chronic kidney disease, or unspecified chronic kidney disease: Secondary | ICD-10-CM | POA: Diagnosis not present

## 2016-12-27 DIAGNOSIS — M47892 Other spondylosis, cervical region: Secondary | ICD-10-CM | POA: Diagnosis not present

## 2016-12-27 DIAGNOSIS — M47812 Spondylosis without myelopathy or radiculopathy, cervical region: Secondary | ICD-10-CM | POA: Diagnosis not present

## 2016-12-27 DIAGNOSIS — Z8679 Personal history of other diseases of the circulatory system: Secondary | ICD-10-CM | POA: Diagnosis not present

## 2016-12-27 DIAGNOSIS — M542 Cervicalgia: Secondary | ICD-10-CM | POA: Diagnosis not present

## 2016-12-27 DIAGNOSIS — R51 Headache: Secondary | ICD-10-CM | POA: Diagnosis not present

## 2017-01-05 DIAGNOSIS — Q253 Supravalvular aortic stenosis: Secondary | ICD-10-CM | POA: Diagnosis not present

## 2017-01-05 DIAGNOSIS — N189 Chronic kidney disease, unspecified: Secondary | ICD-10-CM | POA: Diagnosis not present

## 2017-01-05 DIAGNOSIS — E782 Mixed hyperlipidemia: Secondary | ICD-10-CM | POA: Diagnosis not present

## 2017-01-05 DIAGNOSIS — E890 Postprocedural hypothyroidism: Secondary | ICD-10-CM | POA: Diagnosis not present

## 2017-01-05 DIAGNOSIS — I10 Essential (primary) hypertension: Secondary | ICD-10-CM | POA: Diagnosis not present

## 2017-01-05 DIAGNOSIS — D649 Anemia, unspecified: Secondary | ICD-10-CM | POA: Diagnosis not present

## 2017-01-07 ENCOUNTER — Encounter: Payer: Self-pay | Admitting: Internal Medicine

## 2017-01-07 ENCOUNTER — Ambulatory Visit (INDEPENDENT_AMBULATORY_CARE_PROVIDER_SITE_OTHER): Payer: Medicare Other | Admitting: Internal Medicine

## 2017-01-07 VITALS — BP 120/62 | HR 61 | Wt 138.0 lb

## 2017-01-07 DIAGNOSIS — I2510 Atherosclerotic heart disease of native coronary artery without angina pectoris: Secondary | ICD-10-CM

## 2017-01-07 DIAGNOSIS — E890 Postprocedural hypothyroidism: Secondary | ICD-10-CM | POA: Diagnosis not present

## 2017-01-07 LAB — T4, FREE: Free T4: 1.19 ng/dL (ref 0.60–1.60)

## 2017-01-07 LAB — TSH: TSH: 0.18 u[IU]/mL — AB (ref 0.35–4.50)

## 2017-01-07 MED ORDER — SYNTHROID 75 MCG PO TABS: 75.0000 ug | ORAL_TABLET | Freq: Every day | ORAL | 1 refills | Status: DC
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
## 2017-01-07 NOTE — Patient Instructions (Signed)
Please stop Biotin for 5 days, then come back for labs.  Please continue Synthroid 88 mcg daily.  Take the thyroid hormone every day, with water, at least 30 minutes before breakfast, separated by at least 4 hours from: - acid reflux medications - calcium - iron - multivitamins  Please return in 1 year.

## 2017-01-10 ENCOUNTER — Telehealth: Payer: Self-pay

## 2017-01-10 MED ORDER — SYNTHROID 75 MCG PO TABS: 75.0000 ug | ORAL_TABLET | Freq: Every day | ORAL | 1 refills | Status: DC

## 2017-01-10 NOTE — Telephone Encounter (Signed)
Patient daughter called, I discussed medication changes with her; she understood.Daughter had no questions.

## 2017-01-10 NOTE — Telephone Encounter (Signed)
Called and spoke to daughter. I advised of med changes, daughter had no questions at this time.

## 2017-01-10 NOTE — Telephone Encounter (Signed)
Called patient and gave lab results. Patient had no questions or concerns.  

## 2017-01-10 NOTE — Telephone Encounter (Signed)
Patient's daughter called to speak to clinical staff about a medication change that was discussed with the patient. Patient is confused of the change and needs clarity. Transferred call to [place].

## 2017-01-10 NOTE — Telephone Encounter (Signed)
-----   Message from Philemon Kingdom, MD sent at 01/07/2017  5:22 PM EDT ----- Brenda [name], can you please call pt:  TSH is slightly low >> will need to decrease Synthroid to 75 mcg daily (enetered but not sent). Will check labs again in 1.5 months. Labs are in.

## 2017-02-03 DIAGNOSIS — R609 Edema, unspecified: Secondary | ICD-10-CM | POA: Diagnosis not present

## 2017-02-03 DIAGNOSIS — R252 Cramp and spasm: Secondary | ICD-10-CM | POA: Diagnosis not present

## 2017-02-03 DIAGNOSIS — I10 Essential (primary) hypertension: Secondary | ICD-10-CM | POA: Diagnosis not present

## 2017-02-03 DIAGNOSIS — E890 Postprocedural hypothyroidism: Secondary | ICD-10-CM | POA: Diagnosis not present

## 2017-02-04 DIAGNOSIS — D485 Neoplasm of uncertain behavior of skin: Secondary | ICD-10-CM | POA: Diagnosis not present

## 2017-02-04 DIAGNOSIS — C44311 Basal cell carcinoma of skin of nose: Secondary | ICD-10-CM | POA: Diagnosis not present

## 2017-02-08 DIAGNOSIS — M25362 Other instability, left knee: Secondary | ICD-10-CM | POA: Diagnosis not present

## 2017-02-08 DIAGNOSIS — M25562 Pain in left knee: Secondary | ICD-10-CM | POA: Diagnosis not present

## 2017-02-24 DIAGNOSIS — I10 Essential (primary) hypertension: Secondary | ICD-10-CM | POA: Diagnosis not present

## 2017-02-24 DIAGNOSIS — R609 Edema, unspecified: Secondary | ICD-10-CM | POA: Diagnosis not present

## 2017-02-24 DIAGNOSIS — R252 Cramp and spasm: Secondary | ICD-10-CM | POA: Diagnosis not present

## 2017-03-02 ENCOUNTER — Other Ambulatory Visit (INDEPENDENT_AMBULATORY_CARE_PROVIDER_SITE_OTHER): Payer: Medicare Other

## 2017-03-02 DIAGNOSIS — E890 Postprocedural hypothyroidism: Secondary | ICD-10-CM | POA: Diagnosis not present

## 2017-03-02 LAB — T4, FREE: Free T4: 3 ng/dL — ABNORMAL HIGH (ref 0.60–1.60)

## 2017-03-02 LAB — TSH: TSH: 3.33 u[IU]/mL (ref 0.35–4.50)

## 2017-03-07 ENCOUNTER — Other Ambulatory Visit: Payer: Self-pay

## 2017-03-07 ENCOUNTER — Ambulatory Visit (HOSPITAL_COMMUNITY): Payer: Medicare Other | Attending: Cardiovascular Disease

## 2017-03-07 DIAGNOSIS — I5030 Unspecified diastolic (congestive) heart failure: Secondary | ICD-10-CM | POA: Insufficient documentation

## 2017-03-07 DIAGNOSIS — I350 Nonrheumatic aortic (valve) stenosis: Secondary | ICD-10-CM

## 2017-03-07 DIAGNOSIS — I420 Dilated cardiomyopathy: Secondary | ICD-10-CM | POA: Insufficient documentation

## 2017-03-07 DIAGNOSIS — I080 Rheumatic disorders of both mitral and aortic valves: Secondary | ICD-10-CM | POA: Insufficient documentation

## 2017-03-14 ENCOUNTER — Ambulatory Visit (INDEPENDENT_AMBULATORY_CARE_PROVIDER_SITE_OTHER): Payer: Medicare Other | Admitting: Internal Medicine

## 2017-03-14 ENCOUNTER — Encounter: Payer: Self-pay | Admitting: Internal Medicine

## 2017-03-14 VITALS — BP 128/64 | HR 61 | Ht 63.0 in | Wt 142.0 lb

## 2017-03-14 DIAGNOSIS — I350 Nonrheumatic aortic (valve) stenosis: Secondary | ICD-10-CM

## 2017-03-14 DIAGNOSIS — I2510 Atherosclerotic heart disease of native coronary artery without angina pectoris: Secondary | ICD-10-CM

## 2017-03-14 DIAGNOSIS — I10 Essential (primary) hypertension: Secondary | ICD-10-CM

## 2017-03-14 NOTE — Patient Instructions (Signed)
Your physician recommends that you continue on your current medications as directed. Please refer to the Current Medication list given to you today.   Your physician has requested that you have an echocardiogram. Echocardiography is a painless test that uses sound waves to create images of your heart. It provides your doctor with information about the size and shape of your heart and how well your heart's chambers and valves are working. This procedure takes approximately one hour. There are no restrictions for this procedure.  PLEASE SCHEDULE ECHOCARDIOGRAM SAME DAY AS APPOINTMENT WITH DR. Harrington Challenger.  Your physician wants you to follow-up in: 14 months with Dr. Harrington Challenger (OCTOBER 2019)  You will receive a reminder letter in the mail two months in advance. If you don't receive a letter, please call our office to schedule the follow-up appointment.

## 2017-03-14 NOTE — Progress Notes (Signed)
Cardiology Office Note   Date:  03/14/2017   ID:  [place], [place]rad Nov 21, 1930, MRN [id number]  PCP:  [name], MD  Cardiologist:   [name], MD   F/U of CAD and diastolic dysfunction   History of Present Illness: Brenda [name] is a 83 y.o. female with a history of mild to mod CAD by cath in 3546, diastolic dysfunction, HL, mild AS and sarcoid  SHe has been seen by D Bensimhon. Seen this September   Pt had echo on 8/13  Normal LVEF  Mod AS  Mean gradient 25 mm Hg    The pt says her breathing is OK  She denies CP    Outpatient Medications Prior to Visit  Medication Sig Dispense Refill  . ALPRAZolam (XANAX) 0.5 MG tablet Take 0.5 mg by mouth.    . Biotin 5000 MCG TABS Take 5,000 mcg by mouth daily.     . Cholecalciferol (VITAMIN D3) 1000 UNITS CAPS Take 5,000 Units by mouth daily.    . cycloSPORINE (RESTASIS) 0.05 % ophthalmic emulsion Place 1 drop into both eyes every 12 (twelve) hours.     . DiphenhydrAMINE HCl (BENADRYL ALLERGY PO) Take by mouth as directed.    Marland Kitchen glucosamine-chondroitin 500-400 MG tablet Take 1 tablet by mouth 3 (three) times daily.    . Homeopathic Products (ARTHRITIS RELIEF PO) Take by mouth as directed.    . Multiple Vitamin (MULTIVITAMIN) capsule Take 1 capsule by mouth daily.      . Psyllium (VEGETABLE LAXATIVE PO) Take by mouth as directed.    Marland Kitchen spironolactone (ALDACTONE) 50 MG tablet Take 50 mg by mouth daily.  3  . SYNTHROID 75 MCG tablet Take 1 tablet (75 mcg total) by mouth daily before breakfast. 45 tablet 1  . tiZANidine (ZANAFLEX) 2 MG tablet Take 2 mg by mouth as directed.    . triamterene-hydrochlorothiazide (MAXZIDE) 75-50 MG tablet Take 1 tablet by mouth daily.    . rosuvastatin (CRESTOR) 40 MG tablet Take 1 tablet (40 mg total) by mouth daily. 90 tablet 3  . ALREX 0.2 % SUSP Place 1 drop into both eyes 2 (two) times daily.     Marland Kitchen amLODipine (NORVASC) 5 MG tablet TAKE 1 TABLET BY MOUTH EVERY DAY (NEED APPT) (Patient not taking:  Reported on 03/14/2017) 15 tablet 0  . Omega-3 Fatty Acids (FISH OIL) 1200 MG CAPS Take 1,200 mg by mouth daily.      No facility-administered medications prior to visit.      Allergies:   Patient has no known allergies.   Past Medical History:  Diagnosis Date  . Anxiety   . Asthma   . Barrett's esophagus   . DJD (degenerative joint disease)   . GERD (gastroesophageal reflux disease)   . Goiter   . Hemorrhoids   . History of colonoscopy   . History of shingles    face/eye  . Hx of colonic polyps   . Hyperlipidemia   . Hypertension   . IBS (irritable bowel syndrome)   . Osteoarthritis   . Sarcoidosis   . [facility])     Past Surgical History:  Procedure Laterality Date  . BREAST LUMPECTOMY  1974   left  . CRANIOTOMY Right 01/04/2014   Procedure: CRANIOTOMY HEMATOMA EVACUATION SUBDURAL;  Surgeon: [name], MD;  Location: [place] NEURO ORS;  Service: Neurosurgery;  Laterality: Right;  . CRANIOTOMY N/A 01/08/2014   Procedure: Redo CRANIOTOMY HEMATOMA EVACUATION SUBDURAL RIGHT;  Surgeon: Olga Coaster  Kritzer, MD;  Location: [place] NEURO ORS;  Service: Neurosurgery;  Laterality: N/A;  Redo CRANIOTOMY HEMATOMA EVACUATION SUBDURAL RIGHT  . CRANIOTOMY Right 01/10/2014   Procedure: Redo CRANIOTOMY HEMATOMA EVACUATION SUBDURAL;  Surgeon: [name], MD;  Location: [place] NEURO ORS;  Service: Neurosurgery;  Laterality: Right;  . DILATION AND CURETTAGE OF UTERUS    . NASAL SINUS SURGERY    . NECK SURGERY     x 2  . TONSILLECTOMY AND ADENOIDECTOMY  1976  . TOTAL THYROIDECTOMY  06-05-08     Social History:  The patient  reports that she has never smoked. She has never used smokeless tobacco. She reports that she drinks alcohol. She reports that she does not use drugs.   Family History:  The patient's family history includes Asthma in her sister; Colon cancer in her sister; Heart disease in her father; Heart failure in her father; Hyperlipidemia in her father and mother; Hypertension in her  brother, father, mother, and sister; Kidney cancer in her mother; Lymphoma in her mother; Rheum arthritis in her father; Thyroid cancer in her sister.    ROS:  Please see the history of present illness. All other systems are reviewed and  Negative to the above problem except as noted.    PHYSICAL EXAM: VS:  BP 128/64   Pulse 61   Ht 5\' 3"  (1.6 m)   Wt 142 lb (64.4 kg)   BMI 25.15 kg/m   GEN: Well nourished, well developed, in no acute distress  HEENT: normal  Neck: no JVD, carotid bruits, or masses Cardiac: RRR;  Gr III/VI systolic murmur  No rubs, or gallops,no edema  Respiratory:  clear to auscultation bilaterally, normal work of breathing GI: soft, nontender, nondistended, + BS  No hepatomegaly  MS: no deformity Moving all extremities   Skin: warm and dry, no rash Neuro:  Strength and sensation are intact Psych: euthymic mood, full affect   EKG:  EKG is ordered today.  SR 61 bpm     Lipid Panel    Component Value Date/Time   CHOL 147 08/10/2016 0818   TRIG 161 (H) 08/10/2016 0818   HDL 56 08/10/2016 0818   CHOLHDL 2.6 08/10/2016 0818   LDLCALC 59 08/10/2016 0818      Wt Readings from Last 3 Encounters:  03/14/17 142 lb (64.4 kg)  01/07/17 138 lb (62.6 kg)  07/09/16 137 lb (62.1 kg)      ASSESSMENT AND PLAN: 1  Aortic stenosis Remains moderate  I do not think she is symptomatic  Follow periodically  Will sched for 1 year    2  CAD  No symptoms to sugg angina    3  HTN  BP is adequately controlled   4  Diastolic Dysfunctoin  Volume status is OK      F/U Fall 2019    Current medicines are reviewed at length with the patient today.  The patient does not have concerns regarding medicines.  Signed, [name], MD  03/14/2017 9:54 AM    [address], [place], [place]  [address] Phone: [phone]; Fax: [phone]

## 2017-03-22 DIAGNOSIS — H35423 Microcystoid degeneration of retina, bilateral: Secondary | ICD-10-CM | POA: Diagnosis not present

## 2017-03-22 DIAGNOSIS — H43813 Vitreous degeneration, bilateral: Secondary | ICD-10-CM | POA: Diagnosis not present

## 2017-03-22 DIAGNOSIS — H35362 Drusen (degenerative) of macula, left eye: Secondary | ICD-10-CM | POA: Diagnosis not present

## 2017-03-22 DIAGNOSIS — H35371 Puckering of macula, right eye: Secondary | ICD-10-CM | POA: Diagnosis not present

## 2017-04-05 DIAGNOSIS — Z981 Arthrodesis status: Secondary | ICD-10-CM | POA: Diagnosis not present

## 2017-04-05 DIAGNOSIS — M5030 Other cervical disc degeneration, unspecified cervical region: Secondary | ICD-10-CM | POA: Diagnosis not present

## 2017-04-11 ENCOUNTER — Other Ambulatory Visit: Payer: Self-pay | Admitting: Internal Medicine

## 2017-04-19 DIAGNOSIS — L570 Actinic keratosis: Secondary | ICD-10-CM | POA: Diagnosis not present

## 2017-04-19 DIAGNOSIS — C44311 Basal cell carcinoma of skin of nose: Secondary | ICD-10-CM | POA: Diagnosis not present

## 2017-04-26 DIAGNOSIS — C44311 Basal cell carcinoma of skin of nose: Secondary | ICD-10-CM | POA: Diagnosis not present

## 2017-06-28 DIAGNOSIS — M25562 Pain in left knee: Secondary | ICD-10-CM | POA: Diagnosis not present

## 2017-06-28 DIAGNOSIS — G8929 Other chronic pain: Secondary | ICD-10-CM | POA: Diagnosis not present

## 2017-06-28 DIAGNOSIS — M545 Low back pain: Secondary | ICD-10-CM | POA: Diagnosis not present

## 2017-07-06 ENCOUNTER — Other Ambulatory Visit: Payer: Self-pay | Admitting: Internal Medicine

## 2017-07-14 DIAGNOSIS — E890 Postprocedural hypothyroidism: Secondary | ICD-10-CM | POA: Diagnosis not present

## 2017-07-14 DIAGNOSIS — Q253 Supravalvular aortic stenosis: Secondary | ICD-10-CM | POA: Diagnosis not present

## 2017-07-14 DIAGNOSIS — R609 Edema, unspecified: Secondary | ICD-10-CM | POA: Diagnosis not present

## 2017-07-14 DIAGNOSIS — E663 Overweight: Secondary | ICD-10-CM | POA: Diagnosis not present

## 2017-07-14 DIAGNOSIS — I10 Essential (primary) hypertension: Secondary | ICD-10-CM | POA: Diagnosis not present

## 2017-07-14 DIAGNOSIS — N189 Chronic kidney disease, unspecified: Secondary | ICD-10-CM | POA: Diagnosis not present

## 2017-07-14 DIAGNOSIS — E782 Mixed hyperlipidemia: Secondary | ICD-10-CM | POA: Diagnosis not present

## 2017-07-27 DIAGNOSIS — N189 Chronic kidney disease, unspecified: Secondary | ICD-10-CM | POA: Diagnosis not present

## 2017-08-19 DIAGNOSIS — N189 Chronic kidney disease, unspecified: Secondary | ICD-10-CM | POA: Diagnosis not present

## 2017-08-25 DIAGNOSIS — M1712 Unilateral primary osteoarthritis, left knee: Secondary | ICD-10-CM | POA: Diagnosis not present

## 2017-08-25 DIAGNOSIS — M25552 Pain in left hip: Secondary | ICD-10-CM | POA: Diagnosis not present

## 2017-08-25 DIAGNOSIS — M25562 Pain in left knee: Secondary | ICD-10-CM | POA: Diagnosis not present

## 2017-09-27 DIAGNOSIS — H43813 Vitreous degeneration, bilateral: Secondary | ICD-10-CM | POA: Diagnosis not present

## 2017-09-27 DIAGNOSIS — H35363 Drusen (degenerative) of macula, bilateral: Secondary | ICD-10-CM | POA: Diagnosis not present

## 2017-09-27 DIAGNOSIS — H35423 Microcystoid degeneration of retina, bilateral: Secondary | ICD-10-CM | POA: Diagnosis not present

## 2017-09-27 DIAGNOSIS — H35373 Puckering of macula, bilateral: Secondary | ICD-10-CM | POA: Diagnosis not present

## 2017-10-01 DIAGNOSIS — J101 Influenza due to other identified influenza virus with other respiratory manifestations: Secondary | ICD-10-CM | POA: Diagnosis not present

## 2017-10-01 DIAGNOSIS — R509 Fever, unspecified: Secondary | ICD-10-CM | POA: Diagnosis not present

## 2017-10-08 DIAGNOSIS — R05 Cough: Secondary | ICD-10-CM | POA: Diagnosis not present

## 2017-11-02 ENCOUNTER — Other Ambulatory Visit: Payer: Self-pay | Admitting: Internal Medicine

## 2017-11-03 DIAGNOSIS — R0602 Shortness of breath: Secondary | ICD-10-CM | POA: Diagnosis not present

## 2017-11-03 DIAGNOSIS — I10 Essential (primary) hypertension: Secondary | ICD-10-CM | POA: Diagnosis not present

## 2017-11-10 DIAGNOSIS — R8290 Unspecified abnormal findings in urine: Secondary | ICD-10-CM | POA: Diagnosis not present

## 2017-11-10 DIAGNOSIS — N189 Chronic kidney disease, unspecified: Secondary | ICD-10-CM | POA: Diagnosis not present

## 2018-01-06 ENCOUNTER — Ambulatory Visit: Payer: Medicare Other | Admitting: Internal Medicine

## 2018-02-28 ENCOUNTER — Other Ambulatory Visit: Payer: Self-pay

## 2018-02-28 DIAGNOSIS — I350 Nonrheumatic aortic (valve) stenosis: Secondary | ICD-10-CM

## 2018-03-13 DIAGNOSIS — M71571 Other bursitis, not elsewhere classified, right ankle and foot: Secondary | ICD-10-CM | POA: Diagnosis not present

## 2018-03-13 DIAGNOSIS — M2041 Other hammer toe(s) (acquired), right foot: Secondary | ICD-10-CM | POA: Diagnosis not present

## 2018-03-20 ENCOUNTER — Ambulatory Visit (INDEPENDENT_AMBULATORY_CARE_PROVIDER_SITE_OTHER): Payer: Medicare Other | Admitting: Podiatry

## 2018-03-20 DIAGNOSIS — M898X7 Other specified disorders of bone, ankle and foot: Secondary | ICD-10-CM

## 2018-03-20 NOTE — Patient Instructions (Signed)
Pre-Operative Instructions  Congratulations, you have decided to take an important step towards improving your quality of life.  You can be assured that the doctors and staff at [facility] will be with you every step of the way.  Here are some important things you should know:  1. Plan to be at the surgery center/hospital at least 1 (one) hour prior to your scheduled time, unless otherwise directed by the surgical center/hospital staff.  You must have a responsible adult accompany you, remain during the surgery and drive you home.  Make sure you have directions to the surgical center/hospital to ensure you arrive on time. 2. If you are having surgery at [facility] or [facility] hospitals, you will need a copy of your medical history and physical form from your family physician within one month prior to the date of surgery. We will give you a form for your primary physician to complete.  3. We make every effort to accommodate the date you request for surgery.  However, there are times where surgery dates or times have to be moved.  We will contact you as soon as possible if a change in schedule is required.   4. No aspirin/ibuprofen for one week before surgery.  If you are on aspirin, any non-steroidal anti-inflammatory medications (Mobic, Aleve, Ibuprofen) should not be taken seven (7) days prior to your surgery.  You make take Tylenol for pain prior to surgery.  5. Medications - If you are taking daily heart and blood pressure medications, seizure, reflux, allergy, asthma, anxiety, pain or diabetes medications, make sure you notify the surgery center/hospital before the day of surgery so they can tell you which medications you should take or avoid the day of surgery. 6. No food or drink after midnight the night before surgery unless directed otherwise by surgical center/hospital staff. 7. No alcoholic beverages 24-hours prior to surgery.  No smoking 24-hours prior or 24-hours after  surgery. 8. Wear loose pants or shorts. They should be loose enough to fit over bandages, boots, and casts. 9. Don't wear slip-on shoes. Sneakers are preferred. 10. Bring your boot with you to the surgery center/hospital.  Also bring crutches or a walker if your physician has prescribed it for you.  If you do not have this equipment, it will be provided for you after surgery. 11. If you have not been contacted by the surgery center/hospital by the day before your surgery, call to confirm the date and time of your surgery. 12. Leave-time from work may vary depending on the type of surgery you have.  Appropriate arrangements should be made prior to surgery with your employer. 13. Prescriptions will be provided immediately following surgery by your doctor.  Fill these as soon as possible after surgery and take the medication as directed. Pain medications will not be refilled on weekends and must be approved by the doctor. 14. Remove nail polish on the operative foot and avoid getting pedicures prior to surgery. 15. Wash the night before surgery.  The night before surgery wash the foot and leg well with water and the antibacterial soap provided. Be sure to pay special attention to beneath the toenails and in between the toes.  Wash for at least three (3) minutes. Rinse thoroughly with water and dry well with a towel.  Perform this wash unless told not to do so by your physician.  Enclosed: 1 Ice pack (please put in freezer the night before surgery)   1 Hibiclens skin cleaner     Pre-op instructions  If you have any questions regarding the instructions, please do not hesitate to call our office.  [place]: [address], [place], [place] [address] -- [phone]  [place]: [address] [place], [place] [address] -- [phone]  [place]: [address]  [place], [place] [address] -- [phone]  [place]: [address], [address], [place], [place] [address] -- [phone]  Website:  [email or web address] 

## 2018-03-22 NOTE — Progress Notes (Signed)
   HPI: 84 year old female presenting today as a new patient, referred by Dr. Gershon Mussel, with a chief complaint of painful callus lesions of the right foot that have been present for several months. Walking and bearing weight increases the pain. She has not done anything at home for treatment. Patient is here for further evaluation and treatment.   Past Medical History:  Diagnosis Date  . Anxiety   . Asthma   . Barrett's esophagus   . DJD (degenerative joint disease)   . GERD (gastroesophageal reflux disease)   . Goiter   . Hemorrhoids   . History of colonoscopy   . History of shingles    face/eye  . Hx of colonic polyps   . Hyperlipidemia   . Hypertension   . IBS (irritable bowel syndrome)   . Osteoarthritis   . Sarcoidosis   . [facility])      Physical Exam: General: The patient is alert and oriented x3 in no acute distress.  Dermatology: Skin is warm, dry and supple bilateral lower extremities. Negative for open lesions or macerations.  Vascular: Palpable pedal pulses bilaterally. No edema or erythema noted. Capillary refill within normal limits.  Neurological: Epicritic and protective threshold grossly intact bilaterally.   Musculoskeletal Exam: Palpable exostosis noted to the right hallux and right 2nd toe. Pain with palpation of the areas noted. Range of motion within normal limits to all pedal and ankle joints bilateral. Muscle strength 5/5 in all groups bilateral.   Assessment: 1. Exostosis right hallux 2. Exostosis right 2nd toe    Plan of Care:  1. Patient evaluated.  2. Today we discussed the conservative versus surgical management of the presenting pathology. The patient opts for surgical management. All possible complications and details of the procedure were explained. All patient questions were answered. No guarantees were expressed or implied. 3. Authorization for surgery was initiated today. Surgery will consist of exostectomy right hallux, exostectomy right 2nd  toe with longitudinal skin incision.  4. Return to clinic one week post op.      Edrick Kins, DPM [facility]  Dr. Edrick Kins, DPM    [address], [place] [address]                Office [phone]  Fax [phone]

## 2018-03-28 ENCOUNTER — Telehealth: Payer: Self-pay | Admitting: *Deleted

## 2018-03-28 NOTE — Telephone Encounter (Signed)
"  I'm scheduled to have surgery on Thursday.  Nobody gave me the time I'm supposed to be there."  Someone from the surgical center will call you a day or two prior to your surgery date.  They will give you your arrival time.  "Oh okay, somebody will call me from the surgery center.  I wasn't supposed to call you?"  Yes, that is correct.

## 2018-03-30 ENCOUNTER — Encounter: Payer: Self-pay | Admitting: Podiatry

## 2018-03-30 DIAGNOSIS — E039 Hypothyroidism, unspecified: Secondary | ICD-10-CM | POA: Diagnosis not present

## 2018-03-30 DIAGNOSIS — D1631 Benign neoplasm of short bones of right lower limb: Secondary | ICD-10-CM | POA: Diagnosis not present

## 2018-03-30 DIAGNOSIS — M25774 Osteophyte, right foot: Secondary | ICD-10-CM | POA: Diagnosis not present

## 2018-03-30 DIAGNOSIS — M898X7 Other specified disorders of bone, ankle and foot: Secondary | ICD-10-CM | POA: Diagnosis not present

## 2018-04-05 ENCOUNTER — Ambulatory Visit (INDEPENDENT_AMBULATORY_CARE_PROVIDER_SITE_OTHER): Payer: Medicare Other | Admitting: Podiatry

## 2018-04-05 ENCOUNTER — Ambulatory Visit (INDEPENDENT_AMBULATORY_CARE_PROVIDER_SITE_OTHER): Payer: Medicare Other

## 2018-04-05 DIAGNOSIS — Z9889 Other specified postprocedural states: Secondary | ICD-10-CM

## 2018-04-05 DIAGNOSIS — M898X7 Other specified disorders of bone, ankle and foot: Secondary | ICD-10-CM

## 2018-04-05 MED ORDER — DOXYCYCLINE HYCLATE 100 MG PO TABS: 100.0000 mg | ORAL_TABLET | Freq: Two times a day (BID) | ORAL | 0 refills | Status: DC

## 2018-04-10 ENCOUNTER — Other Ambulatory Visit: Payer: Self-pay | Admitting: Internal Medicine

## 2018-04-11 NOTE — Progress Notes (Signed)
   Subjective:  Patient presents today status post exostectomy right hallux and right 2nd toe. DOS: 03/30/18. She states she is doing well overall. She states the pain has been tolerable and well controlled. She reports some redness and swelling around the incision site. She has been wearing the post op shoe as directed. Patient is here for further evaluation and treatment.    Past Medical History:  Diagnosis Date  . Anxiety   . Asthma   . Barrett's esophagus   . DJD (degenerative joint disease)   . GERD (gastroesophageal reflux disease)   . Goiter   . Hemorrhoids   . History of colonoscopy   . History of shingles    face/eye  . Hx of colonic polyps   . Hyperlipidemia   . Hypertension   . IBS (irritable bowel syndrome)   . Osteoarthritis   . Sarcoidosis   . [facility])       Objective/Physical Exam Neurovascular status intact.  Skin incisions appear to be well coapted with sutures and staples intact. No dehiscence. No active bleeding noted. Moderate edema noted to the surgical extremity. Moderate erythema noted around the incision sites.   Radiographic Exam:  Osteotomies sites appear to be stable with routine healing.  Assessment: 1. s/p exostectomy right hallux and right 2nd toe. DOS: 03/30/18   Plan of Care:  1. Patient was evaluated. X-rays reviewed 2. Prescription for Doxycycline #20 provided to patient.  3. Dressing changed. Keep clean, dry and intact for one week.  4. Continue weightbearing in post op shoe.  5. Return to clinic in one week.    Edrick Kins, DPM [facility]  Dr. Edrick Kins, [address]                                        [place], [place] [address]                Office [phone]  Fax [phone]

## 2018-04-12 ENCOUNTER — Encounter: Payer: Self-pay | Admitting: Podiatry

## 2018-04-12 ENCOUNTER — Ambulatory Visit (INDEPENDENT_AMBULATORY_CARE_PROVIDER_SITE_OTHER): Payer: Medicare Other | Admitting: Podiatry

## 2018-04-12 DIAGNOSIS — Z9889 Other specified postprocedural states: Secondary | ICD-10-CM

## 2018-04-12 DIAGNOSIS — M898X7 Other specified disorders of bone, ankle and foot: Secondary | ICD-10-CM

## 2018-04-12 NOTE — Progress Notes (Signed)
DOS: 03-30-2018 Exostectomy right hallux; Exostectomy right second toe  [place]

## 2018-04-14 NOTE — Progress Notes (Signed)
   Subjective:  Patient presents today status post exostectomy right hallux and right 2nd toe. DOS: 03/30/18. She states she is doing well. She denies any significant pain or modifying factors. She denies any new complaints at this time. She has been using the post op shoe as directed. Patient is here for further evaluation and treatment.    Past Medical History:  Diagnosis Date  . Anxiety   . Asthma   . Barrett's esophagus   . DJD (degenerative joint disease)   . GERD (gastroesophageal reflux disease)   . Goiter   . Hemorrhoids   . History of colonoscopy   . History of shingles    face/eye  . Hx of colonic polyps   . Hyperlipidemia   . Hypertension   . IBS (irritable bowel syndrome)   . Osteoarthritis   . Sarcoidosis   . [facility])       Objective/Physical Exam Neurovascular status intact.  Skin incisions appear to be well coapted with sutures and staples intact. No dehiscence. No active bleeding noted. Moderate edema noted to the surgical extremity. Moderate erythema noted around the incision sites.   Assessment: 1. s/p exostectomy right hallux and right 2nd toe. DOS: 03/30/18   Plan of Care:  1. Patient was evaluated.  2. Sutures left intact for one more week.  3. Recommended antibiotic ointment with a bandage daily to incision sites.  4. Discontinue using post op shoe.  5. Return to clinic in one week for suture removal.    Edrick Kins, DPM [facility]  Dr. Edrick Kins, [address]                                        [address], [place] [address]                Office [phone]  Fax [phone]

## 2018-04-17 ENCOUNTER — Ambulatory Visit (INDEPENDENT_AMBULATORY_CARE_PROVIDER_SITE_OTHER): Payer: Medicare Other | Admitting: Podiatry

## 2018-04-17 ENCOUNTER — Encounter: Payer: Self-pay | Admitting: Podiatry

## 2018-04-17 DIAGNOSIS — M898X7 Other specified disorders of bone, ankle and foot: Secondary | ICD-10-CM

## 2018-04-17 DIAGNOSIS — Z9889 Other specified postprocedural states: Secondary | ICD-10-CM

## 2018-04-18 NOTE — Progress Notes (Signed)
   Subjective:  Patient presents today status post exostectomy right hallux and right 2nd toe. DOS: 03/30/18. She states she is doing well overall. She reports some pain because she accidentally hits the foot on different things. She has been resting the foot, applying antibiotic cream and elevating it for treatment, all which help provide relief. Patient is here for further evaluation and treatment.    Past Medical History:  Diagnosis Date  . Anxiety   . Asthma   . Barrett's esophagus   . DJD (degenerative joint disease)   . GERD (gastroesophageal reflux disease)   . Goiter   . Hemorrhoids   . History of colonoscopy   . History of shingles    face/eye  . Hx of colonic polyps   . Hyperlipidemia   . Hypertension   . IBS (irritable bowel syndrome)   . Osteoarthritis   . Sarcoidosis   . [facility])       Objective/Physical Exam Neurovascular status intact.  Skin incisions appear to be well coapted with sutures and staples intact. No dehiscence. No active bleeding noted. Moderate edema noted to the surgical extremity. Moderate erythema noted around the incision sites.   Assessment: 1. s/p exostectomy right hallux and right 2nd toe. DOS: 03/30/18   Plan of Care:  1. Patient was evaluated.  2. Sutures removed.  3. Continue wearing good shoe gear.  4. May resume full activity with no restrictions.  5. Return to clinic in 4 weeks for final follow up visit.    Edrick Kins, DPM [facility]  Dr. Edrick Kins, [address]                                        [place], [place] [address]                Office [phone]  Fax [phone]

## 2018-04-19 ENCOUNTER — Encounter: Payer: Self-pay | Admitting: Podiatry

## 2018-04-19 NOTE — Progress Notes (Signed)
Patients office visit notes and operative report from 20 March 2018 - Present were placed up front to be mailed out today, 25 September or tomorrow, Thursday 26 September to patients PCP Dr. [name] with [facility]. Medical records are being mailed out per patients request via signed medical records release form. They are being mailed to the following address:  [facility].: Medical Records Dept./Dr. Rex Kras [address] [place], [place] [address]

## 2018-04-20 DIAGNOSIS — R8290 Unspecified abnormal findings in urine: Secondary | ICD-10-CM | POA: Diagnosis not present

## 2018-04-20 DIAGNOSIS — M546 Pain in thoracic spine: Secondary | ICD-10-CM | POA: Diagnosis not present

## 2018-04-20 DIAGNOSIS — R946 Abnormal results of thyroid function studies: Secondary | ICD-10-CM | POA: Diagnosis not present

## 2018-04-20 DIAGNOSIS — M542 Cervicalgia: Secondary | ICD-10-CM | POA: Diagnosis not present

## 2018-05-15 ENCOUNTER — Ambulatory Visit (INDEPENDENT_AMBULATORY_CARE_PROVIDER_SITE_OTHER): Payer: Medicare Other | Admitting: Podiatry

## 2018-05-15 ENCOUNTER — Ambulatory Visit (INDEPENDENT_AMBULATORY_CARE_PROVIDER_SITE_OTHER): Payer: Medicare Other

## 2018-05-15 ENCOUNTER — Encounter: Payer: Self-pay | Admitting: Podiatry

## 2018-05-15 ENCOUNTER — Encounter: Payer: Medicare Other | Admitting: Podiatry

## 2018-05-15 DIAGNOSIS — M898X7 Other specified disorders of bone, ankle and foot: Secondary | ICD-10-CM | POA: Diagnosis not present

## 2018-05-15 DIAGNOSIS — Z9889 Other specified postprocedural states: Secondary | ICD-10-CM

## 2018-05-21 NOTE — Progress Notes (Signed)
   Subjective:  Patient presents today status post exostectomy right hallux and right 2nd toe. DOS: 03/30/18. She reports significant continued pain of the 2nd toe. She reports associated redness and swelling of the toe that began today. She states her pain is greatest at night but denies modifying factors. She has not done anything for treatment. Patient is here for further evaluation and treatment.    Past Medical History:  Diagnosis Date  . Anxiety   . Asthma   . Barrett's esophagus   . DJD (degenerative joint disease)   . GERD (gastroesophageal reflux disease)   . Goiter   . Hemorrhoids   . History of colonoscopy   . History of shingles    face/eye  . Hx of colonic polyps   . Hyperlipidemia   . Hypertension   . IBS (irritable bowel syndrome)   . Osteoarthritis   . Sarcoidosis   . [facility])       Objective/Physical Exam Neurovascular status intact.  Skin incisions appear to be well coapted. No dehiscence. No active bleeding noted. Moderate edema noted to the surgical extremity. Moderate erythema noted around the incision sites.   Radiographic Exam:  Normal osseous mineralization. Joint spaces preserved. No fracture/dislocation/boney destruction.    Assessment: 1. s/p exostectomy right hallux and right 2nd toe. DOS: 03/30/18   Plan of Care:  1. Patient was evaluated. X-Rays reviewed.   2. Recommended wide fitting shoes.  3. Recommended interdigital padding.  4. Return to clinic as needed.    Edrick Kins, DPM [facility]  Dr. Edrick Kins, [place]                                        [place], [place] [address]                Office [phone]  Fax [phone]

## 2018-05-25 ENCOUNTER — Encounter: Payer: Self-pay | Admitting: Internal Medicine

## 2018-06-18 NOTE — Progress Notes (Signed)
Cardiology Office Note   Date:  06/19/2018   ID:  Brenda, [name] [birth year], MRN [id number]  PCP:  [name], MD  Cardiologist:   [name], MD   F/U of CAD and diastolic dysfunction   History of Present Illness: Brenda [name] is a 84 y.o. female with a history of mild to mod CAD by cath in 0102, diastolic dysfunction, HL, mild AS and sarcoid  SHe has been seen by D Bensimhon. Seen this September   Pt had echo on 8/13  Normal LVEF  Mod AS  Mean gradient 25 mm Hg    I saw the pt in Aug 2018  SInce seen breathing has been stable Had shingles on R eye and L back    SOB at times   Occasional dizziness   Occasional CP with activity   Outpatient Medications Prior to Visit  Medication Sig Dispense Refill  . ALPRAZolam (XANAX) 0.5 MG tablet Take 0.5 mg by mouth.    . Biotin 5000 MCG TABS Take 5,000 mcg by mouth daily.     . Cholecalciferol (VITAMIN D3) 1000 UNITS CAPS Take 5,000 Units by mouth daily.    . Cyanocobalamin (VITAMIN B-12 PO) Take by mouth daily.    . cycloSPORINE (RESTASIS) 0.05 % ophthalmic emulsion Place 1 drop into both eyes every 12 (twelve) hours.     . DiphenhydrAMINE HCl (BENADRYL ALLERGY PO) Take by mouth as directed.    Marland Kitchen glucosamine-chondroitin 500-400 MG tablet Take 1 tablet by mouth 3 (three) times daily.    . Homeopathic Products (ARTHRITIS RELIEF PO) Take by mouth as directed.    . Multiple Vitamin (MULTIVITAMIN) capsule Take 1 capsule by mouth daily.      . Psyllium (VEGETABLE LAXATIVE PO) Take by mouth as directed.    . rosuvastatin (CRESTOR) 40 MG tablet Take 1 tablet (40 mg total) by mouth daily. Please keep upcoming appt in November for future refills. Thank you 90 tablet 0  . spironolactone (ALDACTONE) 50 MG tablet Take 50 mg by mouth daily.  3  . SYNTHROID 75 MCG tablet TAKE 1 TABLET (75 MCG TOTAL) BY MOUTH DAILY BEFORE BREAKFAST. 45 tablet 1  . tiZANidine (ZANAFLEX) 2 MG tablet Take 2 mg by mouth as directed.    . Psyllium (VEGETABLE  LAXATIVE PO) Take by mouth as directed.    . calcium-vitamin D (OSCAL WITH D) 500-200 MG-UNIT per tablet Take 1 tablet by mouth daily.      Marland Kitchen doxycycline (VIBRA-TABS) 100 MG tablet Take 1 tablet (100 mg total) by mouth 2 (two) times daily. (Patient not taking: Reported on 06/19/2018) 20 tablet 0  . triamterene-hydrochlorothiazide (MAXZIDE) 75-50 MG tablet Take 1 tablet by mouth daily.     No facility-administered medications prior to visit.      Allergies:   Patient has no known allergies.   Past Medical History:  Diagnosis Date  . Anxiety   . Asthma   . Barrett's esophagus   . DJD (degenerative joint disease)   . GERD (gastroesophageal reflux disease)   . Goiter   . Hemorrhoids   . History of colonoscopy   . History of shingles    face/eye  . Hx of colonic polyps   . Hyperlipidemia   . Hypertension   . IBS (irritable bowel syndrome)   . Osteoarthritis   . Sarcoidosis   . [facility])     Past Surgical History:  Procedure Laterality Date  . BREAST LUMPECTOMY  1974  left  . CRANIOTOMY Right 01/04/2014   Procedure: CRANIOTOMY HEMATOMA EVACUATION SUBDURAL;  Surgeon: [name], MD;  Location: [place] [facility] OR[facility];  Service: Neurosurgery;  Laterality: Right;  . CRANIOTOMY N/A 01/08/2014   Procedure: Redo CRANIOTOMY HEMATOMA EVACUATION SUBDURAL RIGHT;  Surgeon: [name], MD;  Location: [place] [facility]UR[facility] ORS;  Service: Neurosurgery;  Laterality: N/A;  Redo CRANIOTOMY HEMATOMA EVACUATION SUBDURAL RIGHT  . CRANIOTOMY Right 01/10/2014   Procedure: Redo CRANIOTOMY HEMATOMA EVACUATION SUBDURAL;  Surgeon: [name], MD;  Location: [place] NEURO ORS;  Service: Neurosurgery;  Laterality: Right;  . DILATION AND CURETTAGE OF UTERUS    . NASAL SINUS SURGERY    . NECK SURGERY     x 2  . TONSILLECTOMY AND ADENOIDECTOMY  1976  . TOTAL THYROIDECTOMY  06-05-08     Social History:  The patient  reports that she has never smoked. She has never used smokeless tobacco. She reports that she drinks  alcohol. She reports that she does not use drugs.   Family History:  The patient's family history includes Asthma in her sister; Colon cancer in her sister; Heart disease in her father; Heart failure in her father; Hyperlipidemia in her father and mother; Hypertension in her brother, father, mother, and sister; Kidney cancer in her mother; Lymphoma in her mother; Rheum arthritis in her father; Thyroid cancer in her sister.    ROS:  Please see the history of present illness. All other systems are reviewed and  Negative to the above problem except as noted.    PHYSICAL EXAM: VS:  BP (!) 144/70   Pulse 65   Ht 5\' 3"  (1.6 m)   Wt 147 lb 3.2 oz (66.8 kg)   BMI 26.08 kg/m   GEN: overweight 84 yo, in no acute distress  HEENT: normal  Neck: no JVD, carotid bruits, or masses Cardiac: RRR;  Gr III/VI systolic murmur  No rubs, or gallops,no edema  Respiratory:  clear to auscultation bilaterally, normal work of breathing GI: soft, nontender, nondistended, + BS  No hepatomegaly  MS: no deformity Moving all extremities   Skin: warm and dry, no rash Neuro:  Strength and sensation are intact Psych: euthymic mood, full affect   EKG:  EKG is ordered today.  SR 65 bpm  LVH      Lipid Panel    Component Value Date/Time   CHOL 147 08/10/2016 0818   TRIG 161 (H) 08/10/2016 0818   HDL 56 08/10/2016 0818   CHOLHDL 2.6 08/10/2016 0818   LDLCALC 59 08/10/2016 0818      Wt Readings from Last 3 Encounters:  06/19/18 147 lb 3.2 oz (66.8 kg)  03/14/17 142 lb (64.4 kg)  01/07/17 138 lb (62.6 kg)      ASSESSMENT AND PLAN: 1  Aortic stenosis  Will repeat echo to reeval gradient across valve   Pt has occaional chesttightness    2  CAD   Mild to moderate on remote cath     3  HTN  BP is fair.    4  Diastolic Dysfunctoin  Volume status is OK    5  HL   LDL 83     F/U in 6 months  Sooner based on test results    Current medicines are reviewed at length with the patient today.  The patient  does not have concerns regarding medicines.  Signed, [name], MD  06/19/2018 9:40 AM    [address],  [place], [place]  [address] Phone: [phone]; Fax: [phone]

## 2018-06-19 ENCOUNTER — Encounter: Payer: Self-pay | Admitting: Internal Medicine

## 2018-06-19 ENCOUNTER — Ambulatory Visit (INDEPENDENT_AMBULATORY_CARE_PROVIDER_SITE_OTHER): Payer: Medicare Other | Admitting: Internal Medicine

## 2018-06-19 ENCOUNTER — Ambulatory Visit (HOSPITAL_COMMUNITY): Payer: Medicare Other | Attending: Internal Medicine

## 2018-06-19 ENCOUNTER — Other Ambulatory Visit: Payer: Self-pay

## 2018-06-19 ENCOUNTER — Encounter

## 2018-06-19 VITALS — BP 144/70 | HR 65 | Ht 63.0 in | Wt 147.2 lb

## 2018-06-19 DIAGNOSIS — I350 Nonrheumatic aortic (valve) stenosis: Secondary | ICD-10-CM | POA: Insufficient documentation

## 2018-06-19 DIAGNOSIS — I2510 Atherosclerotic heart disease of native coronary artery without angina pectoris: Secondary | ICD-10-CM

## 2018-06-19 DIAGNOSIS — E782 Mixed hyperlipidemia: Secondary | ICD-10-CM

## 2018-06-19 DIAGNOSIS — I359 Nonrheumatic aortic valve disorder, unspecified: Secondary | ICD-10-CM | POA: Diagnosis not present

## 2018-06-19 LAB — ECHOCARDIOGRAM COMPLETE
Height: 63 in
WEIGHTICAEL: 2355.2 [oz_av]

## 2018-06-19 NOTE — Patient Instructions (Signed)
Medication Instructions:  No changes If you need a refill on your cardiac medications before your next appointment, please call your pharmacy.   Lab work: none If you have labs (blood work) drawn today and your tests are completely normal, you will receive your results only by: Marland Kitchen MyChart Message (if you have MyChart) OR . A paper copy in the mail If you have any lab test that is abnormal or we need to change your treatment, we will call you to review the results.  Testing/Procedures: Echo already scheduled today  Follow-Up: At [facility], you and your health needs are our priority.  As part of our continuing mission to provide you with exceptional heart care, we have created designated Provider Care Teams.  These Care Teams include your primary Cardiologist (physician) and Advanced Practice Providers (APPs -  Physician Assistants and Nurse Practitioners) who all work together to provide you with the care you need, when you need it. You will need a follow up appointment in:  6 months.  Please call our office 2 months in advance to schedule this appointment.  You may see [name], MD or one of the following Advanced Practice Providers on your designated Care Team: Richardson Dopp, PA-C [place], [place] . Daune Perch, NP  Any Other Special Instructions Will Be Listed Below (If Applicable). none

## 2018-06-27 DIAGNOSIS — E039 Hypothyroidism, unspecified: Secondary | ICD-10-CM | POA: Diagnosis not present

## 2018-07-03 ENCOUNTER — Other Ambulatory Visit: Payer: Self-pay | Admitting: Internal Medicine

## 2018-07-21 DIAGNOSIS — I5189 Other ill-defined heart diseases: Secondary | ICD-10-CM | POA: Diagnosis not present

## 2018-07-21 DIAGNOSIS — E890 Postprocedural hypothyroidism: Secondary | ICD-10-CM | POA: Diagnosis not present

## 2018-07-21 DIAGNOSIS — I10 Essential (primary) hypertension: Secondary | ICD-10-CM | POA: Diagnosis not present

## 2018-07-21 DIAGNOSIS — E782 Mixed hyperlipidemia: Secondary | ICD-10-CM | POA: Diagnosis not present

## 2018-07-21 DIAGNOSIS — Q253 Supravalvular aortic stenosis: Secondary | ICD-10-CM | POA: Diagnosis not present

## 2018-07-21 DIAGNOSIS — F419 Anxiety disorder, unspecified: Secondary | ICD-10-CM | POA: Diagnosis not present

## 2018-07-21 DIAGNOSIS — Z23 Encounter for immunization: Secondary | ICD-10-CM | POA: Diagnosis not present

## 2018-07-21 DIAGNOSIS — N189 Chronic kidney disease, unspecified: Secondary | ICD-10-CM | POA: Diagnosis not present

## 2018-07-28 DIAGNOSIS — M25562 Pain in left knee: Secondary | ICD-10-CM | POA: Diagnosis not present

## 2018-07-28 DIAGNOSIS — M25561 Pain in right knee: Secondary | ICD-10-CM | POA: Diagnosis not present

## 2018-08-16 DIAGNOSIS — I129 Hypertensive chronic kidney disease with stage 1 through stage 4 chronic kidney disease, or unspecified chronic kidney disease: Secondary | ICD-10-CM | POA: Diagnosis not present

## 2018-08-16 DIAGNOSIS — N183 Chronic kidney disease, stage 3 (moderate): Secondary | ICD-10-CM | POA: Diagnosis not present

## 2018-08-16 DIAGNOSIS — I350 Nonrheumatic aortic (valve) stenosis: Secondary | ICD-10-CM | POA: Diagnosis not present

## 2018-08-25 ENCOUNTER — Ambulatory Visit (INDEPENDENT_AMBULATORY_CARE_PROVIDER_SITE_OTHER): Payer: Medicare Other | Admitting: Internal Medicine

## 2018-08-25 ENCOUNTER — Encounter: Payer: Self-pay | Admitting: Internal Medicine

## 2018-08-25 VITALS — BP 150/60 | HR 62 | Ht 63.0 in | Wt 142.0 lb

## 2018-08-25 DIAGNOSIS — I2510 Atherosclerotic heart disease of native coronary artery without angina pectoris: Secondary | ICD-10-CM

## 2018-08-25 DIAGNOSIS — I359 Nonrheumatic aortic valve disorder, unspecified: Secondary | ICD-10-CM

## 2018-08-25 DIAGNOSIS — E782 Mixed hyperlipidemia: Secondary | ICD-10-CM | POA: Diagnosis not present

## 2018-08-25 DIAGNOSIS — I10 Essential (primary) hypertension: Secondary | ICD-10-CM

## 2018-08-25 NOTE — Progress Notes (Signed)
Cardiology Office Note   Date:  08/25/2018   ID:  Tela, Kotecki 1930-08-13, MRN [id number]  PCP:  [name], MD  Cardiologist:   [name], MD   F/U of CAD and diastolic dysfunction   History of Present Illness: Brenda [name] is a 85 y.o. female with a history of mild to mod CAD by cath in 6606, diastolic dysfunction, HL, mild AS and sarcoid  SHe has been seen by D Bensimhon in 2017    Pt had echo on 03/07/2017  Normal LVEF  Mod AS  Mean gradient 25 mm Hg    I saw her in Nov 2019  Repeated echo  fLVEF was normal   Peak and mean gradients through the valve are 60 and 41 mm Hg respectiviey    The patient comes in with her daughter today to review the echo She says she is busy caring for her husband who has ALS She does note DOE    Takes things at a speed she can tolerate   Wishes she had more energy   Wants to keep moving She denies CP   No PND   Has chronic orhtopnea that is unchanged    Blood pressure readings from home are up and down with ranges from 106 to 174/ 60s to 80s   Average around 140/  Outpatient Medications Prior to Visit  Medication Sig Dispense Refill  . ALPRAZolam (XANAX) 0.5 MG tablet Take 0.5 mg by mouth.    . Biotin 5000 MCG TABS Take 5,000 mcg by mouth daily.     . Cholecalciferol (VITAMIN D3) 1000 UNITS CAPS Take 5,000 Units by mouth daily.    . Cyanocobalamin (VITAMIN B-12 PO) Take by mouth daily.    . cycloSPORINE (RESTASIS) 0.05 % ophthalmic emulsion Place 1 drop into both eyes every 12 (twelve) hours.     . DiphenhydrAMINE HCl (BENADRYL ALLERGY PO) Take by mouth as directed.    Marland Kitchen glucosamine-chondroitin 500-400 MG tablet Take 1 tablet by mouth 3 (three) times daily.    . Homeopathic Products (ARTHRITIS RELIEF PO) Take by mouth as directed.    . Multiple Vitamin (MULTIVITAMIN) capsule Take 1 capsule by mouth daily.      . Psyllium (VEGETABLE LAXATIVE PO) Take by mouth as directed.    . rosuvastatin (CRESTOR) 40 MG tablet TAKE 1 TABLET (40 MG  TOTAL) BY MOUTH DAILY. 90 tablet 3  . spironolactone (ALDACTONE) 50 MG tablet Take 50 mg by mouth daily.  3  . SYNTHROID 75 MCG tablet TAKE 1 TABLET (75 MCG TOTAL) BY MOUTH DAILY BEFORE BREAKFAST. 45 tablet 1  . tiZANidine (ZANAFLEX) 2 MG tablet Take 2 mg by mouth as directed.     No facility-administered medications prior to visit.      Allergies:   Patient has no known allergies.   Past Medical History:  Diagnosis Date  . Anxiety   . Asthma   . Barrett's esophagus   . DJD (degenerative joint disease)   . GERD (gastroesophageal reflux disease)   . Goiter   . Hemorrhoids   . History of colonoscopy   . History of shingles    face/eye  . Hx of colonic polyps   . Hyperlipidemia   . Hypertension   . IBS (irritable bowel syndrome)   . Osteoarthritis   . Sarcoidosis   . [facility])     Past Surgical History:  Procedure Laterality Date  . BREAST LUMPECTOMY  1974   left  .  CRANIOTOMY Right 01/04/2014   Procedure: CRANIOTOMY HEMATOMA EVACUATION SUBDURAL;  Surgeon: [name], MD;  Location: [place] NEURO ORS;  Service: Neurosurgery;  Laterality: Right;  . CRANIOTOMY N/A 01/08/2014   Procedure: Redo CRANIOTOMY HEMATOMA EVACUATION SUBDURAL RIGHT;  Surgeon: [name], MD;  Location: [place] NEUR[facility] ORS;  Service: Neurosurgery;  Laterality: N/A;  Redo CRANIOTOMY HEMATOMA EVACUATION SUBDURAL RIGHT  . CRANIOTOMY Right 01/10/2014   Procedure: Redo CRANIOTOMY HEMATOMA EVACUATION SUBDURAL;  Surgeon: [name], MD;  Location: [place] NEURO ORS;  Service: Neurosurgery;  Laterality: Right;  . DILATION AND CURETTAGE OF UTERUS    . NASAL SINUS SURGERY    . NECK SURGERY     x 2  . TONSILLECTOMY AND ADENOIDECTOMY  1976  . TOTAL THYROIDECTOMY  06-05-08     Social History:  The patient  reports that she has never smoked. She has never used smokeless tobacco. She reports current alcohol use. She reports that she does not use drugs.   Family History:  The patient's family history includes Asthma  in her sister; Colon cancer in her sister; Heart disease in her father; Heart failure in her father; Hyperlipidemia in her father and mother; Hypertension in her brother, father, mother, and sister; Kidney cancer in her mother; Lymphoma in her mother; Rheum arthritis in her father; Thyroid cancer in her sister.    ROS:  Please see the history of present illness. All other systems are reviewed and  Negative to the above problem except as noted.    PHYSICAL EXAM: VS:  BP (!) 150/60   Pulse 62   Ht 5\' 3"  (1.6 m)   Wt 142 lb (64.4 kg)   SpO2 92%   BMI 25.15 kg/m   GEN: overweight 85 yo, in no acute distress  HEENT: normal  Neck: JVp is normal  No  carotid bruits, or masses Cardiac: RRR;  Gr III/VI systolic murmur later peaking   No rubs, or gallops,no edema  Respiratory:  clear to auscultation bilaterally, normal work of breathing GI: soft, nontender, nondistended, + BS  No hepatomegaly  MS: no deformity Moving all extremities   Skin: warm and dry, no rash Neuro:  Strength and sensation are intact Psych: euthymic mood, full affect   EKG:  EKG is not ordered today.     Lipid Panel    Component Value Date/Time   CHOL 147 08/10/2016 0818   TRIG 161 (H) 08/10/2016 0818   HDL 56 08/10/2016 0818   CHOLHDL 2.6 08/10/2016 0818   LDLCALC 59 08/10/2016 0818      Wt Readings from Last 3 Encounters:  08/25/18 142 lb (64.4 kg)  06/19/18 147 lb 3.2 oz (66.8 kg)  03/14/17 142 lb (64.4 kg)      ASSESSMENT AND PLAN: 1  Aortic stenosis  Echo from Novmber shows AS is severe with mean gradient of 40 mm Hg   This is very different from echo from 2018   I have reviewed findings with the pt and her daughter   I am concerned that her dyspma and fatigue are related to the valve    We discussed therapeutic optsions     Risks / benefits for evaluation (CT, cath, intervention)     I will review her findings with our strutural heart team and aget back to the pt re evaluation.   The patient  understands potental testing , including risks and is agreeable to proceeding   She wants to feel better  2  CAD  Mild to moderate on remote cath   I am not convincied SOB / DOE is related to worsening CAD   Again, many need Cath   3  HTN  BP is labile   I would not make any changes now  4  Diastolic Dysfunctoin  Volume status is OK    5  HL   LDL 83         Current medicines are reviewed at length with the patient today.  The patient does not have concerns regarding medicines.  Signed, [name], MD  08/25/2018 10:27 AM    [address], [place], [place]  [address] Phone: [phone]; Fax: [phone]

## 2018-08-25 NOTE — Patient Instructions (Signed)
Medication Instructions:   No change If you need a refill on your cardiac medications before your next appointment, please call your pharmacy.   Lab work: none If you have labs (blood work) drawn today and your tests are completely normal, you will receive your results only by: Marland Kitchen MyChart Message (if you have MyChart) OR . A paper copy in the mail If you have any lab test that is abnormal or we need to change your treatment, we will call you to review the results.  Testing/Procedures: none  Follow-Up: We will contact you or your daughter regarding an appointment with Dr. Burt Knack or Dr. Angelena Form to discuss aortic valve repair.  Any Other Special Instructions Will Be Listed Below (If Applicable).
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
## 2018-09-08 ENCOUNTER — Ambulatory Visit (INDEPENDENT_AMBULATORY_CARE_PROVIDER_SITE_OTHER): Payer: Medicare Other | Admitting: Family Medicine

## 2018-09-08 ENCOUNTER — Encounter: Payer: Self-pay | Admitting: Family Medicine

## 2018-09-08 VITALS — BP 120/80 | HR 71 | Ht 63.0 in | Wt 138.0 lb

## 2018-09-08 DIAGNOSIS — I2510 Atherosclerotic heart disease of native coronary artery without angina pectoris: Secondary | ICD-10-CM | POA: Diagnosis not present

## 2018-09-08 DIAGNOSIS — M47812 Spondylosis without myelopathy or radiculopathy, cervical region: Secondary | ICD-10-CM

## 2018-09-08 DIAGNOSIS — M542 Cervicalgia: Secondary | ICD-10-CM | POA: Diagnosis not present

## 2018-09-08 MED ORDER — DICLOFENAC SODIUM 1 % TD GEL: 2.0000 g | Freq: Every day | TRANSDERMAL | 11 refills | Status: DC

## 2018-09-08 NOTE — Assessment & Plan Note (Signed)
Patient has significant neck arthritis.  I believe the mass on the left side of her neck is more secondary to the callus formation.  This is right where patient has severe facet arthropathy as well as near the infusion.  Discussed with with patient as well as her daughter and caregiver.  We discussed the possibility of ultrasound of the area but did not think it would change her management.  We also discussed once again repeating CT scan to see if anything is changed.  We discussed over-the-counter safe medications that could be somewhat beneficial and we discussed a topical anti-inflammatory to try.  Patient has had difficulty with stomach bleeds and warned potential side effects and to discontinue but likely would be much less likely trying to do it and to use it on an as-needed basis.  We discussed icing regimen, discussed home exercises working on the upper back musculature.  Patient will try these interventions and come back in 6 weeks

## 2018-09-08 NOTE — Patient Instructions (Addendum)
Good to see you  Voltaren gel 1 time daily as needed for pain. About a finger tip amount.  Ice 20 minutes 2 times daily. Usually after activity and before bed. Exercises 3 times a week.  Vitamin D 4000 IU daily  Vitamin C 500mg  daily  Tart cherry extract any dose at night Try to keep hands within peripheral vision  See me again in 6 weeks

## 2018-09-10 NOTE — Progress Notes (Signed)
Would like for pt to be seen in structural clinic   Reviewed with Kathlene November who will set up

## 2018-09-15 ENCOUNTER — Telehealth: Payer: Self-pay

## 2018-09-15 ENCOUNTER — Institutional Professional Consult (permissible substitution): Payer: Medicare Other | Admitting: Cardiovascular Disease
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
## 2018-09-21 ENCOUNTER — Encounter: Payer: Self-pay | Admitting: Cardiovascular Disease

## 2018-09-21 ENCOUNTER — Ambulatory Visit (INDEPENDENT_AMBULATORY_CARE_PROVIDER_SITE_OTHER): Payer: Medicare Other | Admitting: Cardiovascular Disease

## 2018-09-21 VITALS — BP 120/60 | HR 85 | Ht 63.0 in | Wt 140.4 lb

## 2018-09-21 DIAGNOSIS — I2510 Atherosclerotic heart disease of native coronary artery without angina pectoris: Secondary | ICD-10-CM | POA: Diagnosis not present

## 2018-09-21 DIAGNOSIS — I350 Nonrheumatic aortic (valve) stenosis: Secondary | ICD-10-CM | POA: Diagnosis not present

## 2018-09-21 NOTE — Patient Instructions (Addendum)
Medication Instructions:  Your provider recommends that you continue on your current medications as directed. Please refer to the Current Medication list given to you today.    Labwork: TODAY! BMET, CBC in preparation for your catheterization  Testing/Procedures: Your physician has requested that you have a cardiac catheterization. Cardiac catheterization is used to diagnose and/or treat various heart conditions. Doctors may recommend this procedure for a number of different reasons. The most common reason is to evaluate chest pain. Chest pain can be a symptom of coronary artery disease (CAD), and cardiac catheterization can show whether plaque is narrowing or blocking your heart's arteries. This procedure is also used to evaluate the valves, as well as measure the blood flow and oxygen levels in different parts of your heart. For further information please visit HugeFiesta.[email or web address]. Please follow instruction sheet, as given.  Follow-Up: The TAVR team will contact you for further arrangements after your catheterization is complete.  Any Other Special Instructions Will Be Listed Below (If Applicable).  CATHETERIZATION INSTRUCTIONS: You are scheduled for a Cardiac Catheterization on Wednesday, March 4 with Dr. Lauree Chandler.  1. Please arrive at the [facility] (Main Entrance A) at [facility]: [address] [place], [place] [address] at 9:30 AM (This time is two hours before your procedure to ensure your preparation). Free valet parking service is available.   Special note: Every effort is made to have your procedure done on time. Please understand that emergencies sometimes delay scheduled procedures.  2. Diet: Do not eat solid foods after midnight.  The patient may have clear liquids until 5am upon the day of the procedure.  3. Labs: Today!  4. Medication instructions in preparation for your procedure:  1) TAKE ASPIRIN 81 mg the morning of your procedure  2) you may take  your other medications as directed with sips of water  5. Plan for one night stay--bring personal belongings. 6. Bring a current list of your medications and current insurance cards. 7. You MUST have a responsible person to drive you home. 8. Someone MUST be with you the first 24 hours after you arrive home or your discharge will be delayed. 9. Please wear clothes that are easy to get on and off and wear slip-on shoes.  Thank you for allowing [place] to care for you!   -- [place] [facility]
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
## 2018-09-22 LAB — CBC WITH DIFFERENTIAL/PLATELET
Basophils Absolute: 0 10*3/uL (ref 0.0–0.2)
Basos: 0 %
EOS (ABSOLUTE): 0.2 10*3/uL (ref 0.0–0.4)
Eos: 3 %
HEMATOCRIT: 38 % (ref 34.0–46.6)
Hemoglobin: 12.8 g/dL (ref 11.1–15.9)
Immature Grans (Abs): 0 10*3/uL (ref 0.0–0.1)
Immature Granulocytes: 0 %
Lymphocytes Absolute: 1.5 10*3/uL (ref 0.7–3.1)
Lymphs: 23 %
MCH: 30.3 pg (ref 26.6–33.0)
MCHC: 33.7 g/dL (ref 31.5–35.7)
MCV: 90 fL (ref 79–97)
Monocytes Absolute: 0.8 10*3/uL (ref 0.1–0.9)
Monocytes: 12 %
Neutrophils Absolute: 3.9 10*3/uL (ref 1.4–7.0)
Neutrophils: 62 %
Platelets: 216 10*3/uL (ref 150–450)
RBC: 4.23 x10E6/uL (ref 3.77–5.28)
RDW: 13.4 % (ref 11.7–15.4)
WBC: 6.4 10*3/uL (ref 3.4–10.8)

## 2018-09-22 LAB — BASIC METABOLIC PANEL
BUN/Creatinine Ratio: 17 (ref 12–28)
BUN: 18 mg/dL (ref 8–27)
CO2: 23 mmol/L (ref 20–29)
Calcium: 9.6 mg/dL (ref 8.7–10.3)
Chloride: 103 mmol/L (ref 96–106)
Creatinine, Ser: 1.08 mg/dL — ABNORMAL HIGH (ref 0.57–1.00)
GFR calc Af Amer: 54 mL/min/{1.73_m2} — ABNORMAL LOW (ref >59)
GFR calc non Af Amer: 47 mL/min/{1.73_m2} — ABNORMAL LOW (ref >59)
Glucose: 92 mg/dL (ref 65–99)
Potassium: 4.3 mmol/L (ref 3.5–5.2)
Sodium: 143 mmol/L (ref 134–144)

## 2018-09-26 ENCOUNTER — Telehealth: Payer: Self-pay | Admitting: *Deleted

## 2018-09-26 NOTE — Telephone Encounter (Signed)
Pt contacted pre-catheterization scheduled at [facility] for: Wednesday September 27, 2018 11:30 AM Verified arrival time and place: [place] Entrance A at: 9:30 AM  No solid food after midnight prior to cath, clear liquids until 5 AM day of procedure. Contrast allergy: no  Hold: Spironolactone-day before and day of procedure. NSAIDS-day before and day of procedure.   Except hold medications AM meds can be  taken pre-cath with sip of water including: ASA 81 mg  Please increase your intake of fluids (water is best) in moderation to hydrate before the catheterization.  Confirmed patient has responsible person to drive home post procedure and observe 24 hours after arriving home: yes  I reviewed instructions with patient's daughter, Lynelle Smoke ([place]), she verbalized understanding, thanked me for call.

## 2018-09-27 ENCOUNTER — Encounter (HOSPITAL_COMMUNITY)
Admission: RE | Disposition: A | Discharge status: Home / Self Care | Payer: Self-pay | Source: Home / Self Care | Attending: Cardiovascular Disease

## 2018-09-27 ENCOUNTER — Other Ambulatory Visit: Payer: Self-pay

## 2018-09-27 ENCOUNTER — Ambulatory Visit (HOSPITAL_COMMUNITY)
Admission: RE | Admit: 2018-09-27 | Discharge: 2018-09-27 | Disposition: A | Discharge status: Home / Self Care | Payer: Medicare Other | Attending: Cardiovascular Disease | Admitting: Cardiovascular Disease

## 2018-09-27 DIAGNOSIS — I2510 Atherosclerotic heart disease of native coronary artery without angina pectoris: Secondary | ICD-10-CM | POA: Diagnosis not present

## 2018-09-27 DIAGNOSIS — Z006 Encounter for examination for normal comparison and control in clinical research program: Secondary | ICD-10-CM

## 2018-09-27 DIAGNOSIS — Z79899 Other long term (current) drug therapy: Secondary | ICD-10-CM | POA: Diagnosis not present

## 2018-09-27 DIAGNOSIS — I10 Essential (primary) hypertension: Secondary | ICD-10-CM | POA: Insufficient documentation

## 2018-09-27 DIAGNOSIS — K589 Irritable bowel syndrome without diarrhea: Secondary | ICD-10-CM | POA: Insufficient documentation

## 2018-09-27 DIAGNOSIS — R0602 Shortness of breath: Secondary | ICD-10-CM | POA: Diagnosis not present

## 2018-09-27 DIAGNOSIS — D869 Sarcoidosis, unspecified: Secondary | ICD-10-CM | POA: Diagnosis not present

## 2018-09-27 DIAGNOSIS — K2270 Barrett's esophagus without dysplasia: Secondary | ICD-10-CM | POA: Insufficient documentation

## 2018-09-27 DIAGNOSIS — Z79890 Hormone replacement therapy (postmenopausal): Secondary | ICD-10-CM | POA: Insufficient documentation

## 2018-09-27 DIAGNOSIS — Z8249 Family history of ischemic heart disease and other diseases of the circulatory system: Secondary | ICD-10-CM | POA: Diagnosis not present

## 2018-09-27 DIAGNOSIS — I350 Nonrheumatic aortic (valve) stenosis: Secondary | ICD-10-CM | POA: Diagnosis not present

## 2018-09-27 DIAGNOSIS — I060 Rheumatic aortic stenosis: Secondary | ICD-10-CM | POA: Diagnosis not present

## 2018-09-27 DIAGNOSIS — E785 Hyperlipidemia, unspecified: Secondary | ICD-10-CM | POA: Diagnosis not present

## 2018-09-27 DIAGNOSIS — I2584 Coronary atherosclerosis due to calcified coronary lesion: Secondary | ICD-10-CM | POA: Insufficient documentation

## 2018-09-27 DIAGNOSIS — M1990 Unspecified osteoarthritis, unspecified site: Secondary | ICD-10-CM | POA: Diagnosis not present

## 2018-09-27 HISTORY — PX: RIGHT/LEFT HEART CATH AND CORONARY ANGIOGRAPHY: CATH118266

## 2018-09-27 LAB — POCT I-STAT 7, (LYTES, BLD GAS, ICA,H+H)
Acid-base deficit: 1 mmol/L (ref 0.0–2.0)
Bicarbonate: 24.3 mmol/L (ref 20.0–28.0)
Calcium, Ion: 1.18 mmol/L (ref 1.15–1.40)
HCT: 32 % — ABNORMAL LOW (ref 36.0–46.0)
Hemoglobin: 10.9 g/dL — ABNORMAL LOW (ref 12.0–15.0)
O2 Saturation: 98 %
PCO2 ART: 41.9 mmHg (ref 32.0–48.0)
Potassium: 3.5 mmol/L (ref 3.5–5.1)
Sodium: 143 mmol/L (ref 135–145)
TCO2: 26 mmol/L (ref 22–32)
pH, Arterial: 7.371 (ref 7.350–7.450)
pO2, Arterial: 99 mmHg (ref 83.0–108.0)

## 2018-09-27 LAB — POCT I-STAT EG7
Bicarbonate: 24.9 mmol/L (ref 20.0–28.0)
Calcium, Ion: 1.2 mmol/L (ref 1.15–1.40)
HEMATOCRIT: 32 % — AB (ref 36.0–46.0)
Hemoglobin: 10.9 g/dL — ABNORMAL LOW (ref 12.0–15.0)
O2 Saturation: 65 %
Potassium: 3.6 mmol/L (ref 3.5–5.1)
SODIUM: 142 mmol/L (ref 135–145)
TCO2: 26 mmol/L (ref 22–32)
pCO2, Ven: 40.1 mmHg — ABNORMAL LOW (ref 44.0–60.0)
pH, Ven: 7.401 (ref 7.250–7.430)
pO2, Ven: 34 mmHg (ref 32.0–45.0)

## 2018-09-27 SURGERY — RIGHT/LEFT HEART CATH AND CORONARY ANGIOGRAPHY
Anesthesia: LOCAL

## 2018-09-27 MED ORDER — SODIUM CHLORIDE 0.9% FLUSH: 3.0000 mL | Freq: Two times a day (BID) | INTRAVENOUS | Status: DC

## 2018-09-27 MED ORDER — ASPIRIN 81 MG PO CHEW: 81.0000 mg | CHEWABLE_TABLET | ORAL | Status: DC

## 2018-09-27 MED ORDER — HEPARIN SODIUM (PORCINE) 1000 UNIT/ML IJ SOLN
INTRAMUSCULAR | Status: DC | PRN
  Administered 2018-09-27: 3000 [IU] via INTRAVENOUS

## 2018-09-27 MED ORDER — HEPARIN (PORCINE) IN NACL 1000-0.9 UT/500ML-% IV SOLN
INTRAVENOUS | Status: AC
  Filled 2018-09-27: qty 1000

## 2018-09-27 MED ORDER — SODIUM CHLORIDE 0.9 % IV SOLN: 250.0000 mL | INTRAVENOUS | Status: DC | PRN

## 2018-09-27 MED ORDER — LIDOCAINE HCL (PF) 1 % IJ SOLN
INTRAMUSCULAR | Status: AC
  Filled 2018-09-27: qty 30

## 2018-09-27 MED ORDER — VERAPAMIL HCL 2.5 MG/ML IV SOLN
INTRAVENOUS | Status: AC
  Filled 2018-09-27: qty 2

## 2018-09-27 MED ORDER — SODIUM CHLORIDE 0.9% FLUSH: 3.0000 mL | INTRAVENOUS | Status: DC | PRN

## 2018-09-27 MED ORDER — SODIUM CHLORIDE 0.9 % WEIGHT BASED INFUSION
3.0000 mL/kg/h | INTRAVENOUS | Status: AC
  Administered 2018-09-27: 3 mL/kg/h via INTRAVENOUS

## 2018-09-27 MED ORDER — MIDAZOLAM HCL 2 MG/2ML IJ SOLN
INTRAMUSCULAR | Status: AC
  Filled 2018-09-27: qty 2

## 2018-09-27 MED ORDER — ACETAMINOPHEN 325 MG PO TABS: 650.0000 mg | ORAL_TABLET | ORAL | Status: DC | PRN

## 2018-09-27 MED ORDER — IOPAMIDOL (ISOVUE-370) INJECTION 76%
INTRAVENOUS | Status: DC | PRN
  Administered 2018-09-27: 70 mL via INTRA_ARTERIAL

## 2018-09-27 MED ORDER — SODIUM CHLORIDE 0.9 % WEIGHT BASED INFUSION: 1.0000 mL/kg/h | INTRAVENOUS | Status: DC

## 2018-09-27 MED ORDER — SODIUM CHLORIDE 0.9 % IV SOLN: INTRAVENOUS | Status: AC

## 2018-09-27 MED ORDER — FENTANYL CITRATE (PF) 100 MCG/2ML IJ SOLN
INTRAMUSCULAR | Status: DC | PRN
  Administered 2018-09-27 (×2): 25 ug via INTRAVENOUS

## 2018-09-27 MED ORDER — MIDAZOLAM HCL 2 MG/2ML IJ SOLN
INTRAMUSCULAR | Status: DC | PRN
  Administered 2018-09-27 (×2): 1 mg via INTRAVENOUS

## 2018-09-27 MED ORDER — FENTANYL CITRATE (PF) 100 MCG/2ML IJ SOLN
INTRAMUSCULAR | Status: AC
  Filled 2018-09-27: qty 2

## 2018-09-27 MED ORDER — ONDANSETRON HCL 4 MG/2ML IJ SOLN: 4.0000 mg | Freq: Four times a day (QID) | INTRAMUSCULAR | Status: DC | PRN

## 2018-09-27 MED ORDER — LIDOCAINE HCL (PF) 1 % IJ SOLN
INTRAMUSCULAR | Status: DC | PRN
  Administered 2018-09-27: 2 mL
  Administered 2018-09-27: 15 mL
  Administered 2018-09-27: 2 mL

## 2018-09-27 MED ORDER — HEPARIN (PORCINE) IN NACL 1000-0.9 UT/500ML-% IV SOLN
INTRAVENOUS | Status: DC | PRN
  Administered 2018-09-27 (×2): 500 mL

## 2018-09-27 MED ORDER — VERAPAMIL HCL 2.5 MG/ML IV SOLN
INTRAVENOUS | Status: DC | PRN
  Administered 2018-09-27: 13:00:00 via INTRA_ARTERIAL

## 2018-09-27 SURGICAL SUPPLY — 19 items
CATH 5FR JL3.5 JR4 ANG PIG MP (CATHETERS) ×1 IMPLANT
CATH BALLN WEDGE 5F 110CM (CATHETERS) ×2 IMPLANT
CATH EXPO 5F MPA-1 (CATHETERS) ×2 IMPLANT
CATH INFINITI 5FR AL1 (CATHETERS) ×1 IMPLANT
CATH SWAN GANZ 7F STRAIGHT (CATHETERS) ×2 IMPLANT
DEVICE RAD COMP TR BAND LRG (VASCULAR PRODUCTS) ×1 IMPLANT
GLIDESHEATH SLEND A-KIT 6F 20G (SHEATH) ×2 IMPLANT
GLIDESHEATH SLEND SS 6F .021 (SHEATH) ×2 IMPLANT
GUIDEWIRE .025 260CM (WIRE) ×2 IMPLANT
GUIDEWIRE INQWIRE 1.5J.035X260 (WIRE) ×1 IMPLANT
INQWIRE 1.5J .035X260CM (WIRE) ×2
KIT HEART LEFT (KITS) ×2 IMPLANT
PACK CARDIAC CATHETERIZATION (CUSTOM PROCEDURE TRAY) ×2 IMPLANT
SHEATH GLIDE SLENDER 4/5FR (SHEATH) ×2 IMPLANT
SHEATH PINNACLE 7F 10CM (SHEATH) ×2 IMPLANT
SHEATH PROBE COVER 6X72 (BAG) ×2 IMPLANT
TRANSDUCER W/STOPCOCK (MISCELLANEOUS) ×2 IMPLANT
TUBING CIL FLEX 10 FLL-RA (TUBING) ×2 IMPLANT
WIRE EMERALD ST .035X150CM (WIRE) ×2 IMPLANT

## 2018-09-27 NOTE — Progress Notes (Signed)
Pure wix placed for pt to void

## 2018-09-27 NOTE — Progress Notes (Addendum)
Spoke with Dr Angelena Form .states pt needs to have 3 hours bedrest before discharge

## 2018-09-27 NOTE — Interval H&P Note (Signed)
History and Physical Interval Note:  09/27/2018 11:52 AM  [name]  has presented today for cardiac cath with the diagnosis of severe aortic stenosis  The various methods of treatment have been discussed with the patient and family. After consideration of risks, benefits and other options for treatment, the patient has consented to  Procedure(s): RIGHT/LEFT HEART CATH AND CORONARY ANGIOGRAPHY (N/A) as a surgical intervention .  The patient's history has been reviewed, patient examined, no change in status, stable for surgery.  I have reviewed the patient's chart and labs.  Questions were answered to the patient's satisfaction.    Cath Lab Visit (complete for each Cath Lab visit)  Clinical Evaluation Leading to the Procedure:   ACS: No.  Non-ACS:    Anginal Classification: CCS II  Anti-ischemic medical therapy: No Therapy  Non-Invasive Test Results: No non-invasive testing performed  Prior CABG: No previous CABG         Brenda [name]

## 2018-09-27 NOTE — Discharge Instructions (Signed)
Drink plenty of fluids and keep right arm at or above heart  Level Radial Site Care  This sheet gives you information about how to care for yourself after your procedure. Your health care provider may also give you more specific instructions. If you have problems or questions, contact your health care provider. What can I expect after the procedure? After the procedure, it is common to have:  Bruising and tenderness at the catheter insertion area. Follow these instructions at home: Medicines  Take over-the-counter and prescription medicines only as told by your health care provider. Insertion site care  Follow instructions from your health care provider about how to take care of your insertion site. Make sure you: ? Wash your hands with soap and water before you change your bandage (dressing). If soap and water are not available, use hand sanitizer. ? Change your dressing as told by your health care provider. ? Leave stitches (sutures), skin glue, or adhesive strips in place. These skin closures may need to stay in place for 2 weeks or longer. If adhesive strip edges start to loosen and curl up, you may trim the loose edges. Do not remove adhesive strips completely unless your health care provider tells you to do that.  Check your insertion site every day for signs of infection. Check for: ? Redness, swelling, or pain. ? Fluid or blood. ? Pus or a bad smell. ? Warmth.  Do not take baths, swim, or use a hot tub until your health care provider approves.  You may shower 24-48 hours after the procedure, or as directed by your health care provider. ? Remove the dressing and gently wash the site with plain soap and water. ? Pat the area dry with a clean towel. ? Do not rub the site. That could cause bleeding.  Do not apply powder or lotion to the site. Activity   For 24 hours after the procedure, or as directed by your health care provider: ? Do not flex or bend the affected  arm. ? Do not push or pull heavy objects with the affected arm. ? Do not drive yourself home from the hospital or clinic. You may drive 24 hours after the procedure unless your health care provider tells you not to. ? Do not operate machinery or power tools.  Do not lift anything that is heavier than 10 lb (4.5 kg), or the limit that you are told, until your health care provider says that it is safe.  Ask your health care provider when it is okay to: ? Return to work or school. ? Resume usual physical activities or sports. ? Resume sexual activity. General instructions  If the catheter site starts to bleed, raise your arm and put firm pressure on the site. If the bleeding does not stop, get help right away. This is a medical emergency.  If you went home on the same day as your procedure, a responsible adult should be with you for the first 24 hours after you arrive home.  Keep all follow-up visits as told by your health care provider. This is important. Contact a health care provider if:  You have a fever.  You have redness, swelling, or yellow drainage around your insertion site. Get help right away if:  You have unusual pain at the radial site.  The catheter insertion area swells very fast.  The insertion area is bleeding, and the bleeding does not stop when you hold steady pressure on the area.  Your arm or  hand becomes pale, cool, tingly, or numb. °These symptoms may represent a serious problem that is an emergency. Do not wait to see if the symptoms will go away. Get medical help right away. Call your local emergency services (911 in the [place] Do not drive yourself to the hospital. °Summary °· After the procedure, it is common to have bruising and tenderness at the site. °· Follow instructions from your health care provider about how to take care of your radial site wound. Check the wound every day for signs of infection. °· Do not lift anything that is heavier than 10 lb (4.5  kg), or the limit that you are told, until your health care provider says that it is safe. °This information is not intended to replace advice given to you by your health care provider. Make sure you discuss any questions you have with your health care provider. °Document Released: 08/14/2010 Document Revised: 08/17/2017 Document Reviewed: 08/17/2017 °Elsevier Interactive Patient Education © 2019 [employer] ° °

## 2018-09-27 NOTE — Research (Signed)
Brenda [name] met inclusion and exclusion criteria.  The informed consent form, study requirements and expectations were reviewed with the subject and questions and concerns were addressed prior to the signing of the consent form.  The subject verbalized understanding of the trial requirements.  The subject agreed to participate in the [facility] trial and signed the informed consent.  The informed consent was obtained prior to performance of any protocol-specific procedures for the subject.  A copy of the signed informed consent was given to the subject and a copy was placed in the subject's medical record.   [name]. [employer]

## 2018-09-28 ENCOUNTER — Encounter (HOSPITAL_COMMUNITY): Payer: Self-pay | Admitting: Cardiovascular Disease

## 2018-10-03 ENCOUNTER — Ambulatory Visit (HOSPITAL_COMMUNITY)
Admission: RE | Admit: 2018-10-03 | Discharge: 2018-10-03 | Disposition: A | Discharge status: Home / Self Care | Payer: Medicare Other | Source: Ambulatory Visit | Attending: Cardiovascular Disease | Admitting: Cardiovascular Disease

## 2018-10-03 ENCOUNTER — Other Ambulatory Visit: Payer: Self-pay

## 2018-10-03 ENCOUNTER — Ambulatory Visit (HOSPITAL_BASED_OUTPATIENT_CLINIC_OR_DEPARTMENT_OTHER)
Admission: RE | Admit: 2018-10-03 | Discharge: 2018-10-03 | Disposition: A | Discharge status: Home / Self Care | Payer: Medicare Other | Source: Ambulatory Visit | Attending: Cardiovascular Disease | Admitting: Cardiovascular Disease

## 2018-10-03 DIAGNOSIS — I2510 Atherosclerotic heart disease of native coronary artery without angina pectoris: Secondary | ICD-10-CM | POA: Diagnosis not present

## 2018-10-03 DIAGNOSIS — I350 Nonrheumatic aortic (valve) stenosis: Secondary | ICD-10-CM

## 2018-10-03 DIAGNOSIS — I700 Atherosclerosis of aorta: Secondary | ICD-10-CM | POA: Insufficient documentation

## 2018-10-03 DIAGNOSIS — R590 Localized enlarged lymph nodes: Secondary | ICD-10-CM | POA: Diagnosis not present

## 2018-10-03 DIAGNOSIS — R942 Abnormal results of pulmonary function studies: Secondary | ICD-10-CM | POA: Diagnosis not present

## 2018-10-03 DIAGNOSIS — I6523 Occlusion and stenosis of bilateral carotid arteries: Secondary | ICD-10-CM | POA: Insufficient documentation

## 2018-10-03 LAB — PULMONARY FUNCTION TEST
DL/VA % pred: 69 %
DL/VA: 2.85 ml/min/mmHg/L
DLCO unc % pred: 50 %
DLCO unc: 8.94 ml/min/mmHg
FEF 25-75 Post: 2.34 L/sec
FEF 25-75 Pre: 2.09 L/sec
FEF2575-%Change-Post: 11 %
FEF2575-%Pred-Post: 213 %
FEF2575-%Pred-Pre: 191 %
FEV1-%Change-Post: 2 %
FEV1-%PRED-PRE: 113 %
FEV1-%Pred-Post: 115 %
FEV1-POST: 1.93 L
FEV1-Pre: 1.89 L
FEV1FVC-%Change-Post: 3 %
FEV1FVC-%PRED-PRE: 111 %
FEV6-%Change-Post: 0 %
FEV6-%Pred-Post: 108 %
FEV6-%Pred-Pre: 109 %
FEV6-Post: 2.31 L
FEV6-Pre: 2.33 L
FEV6FVC-%Pred-Post: 106 %
FEV6FVC-%Pred-Pre: 106 %
FVC-%Change-Post: 0 %
FVC-%Pred-Post: 101 %
FVC-%Pred-Pre: 102 %
FVC-Post: 2.31 L
FVC-Pre: 2.33 L
PRE FEV1/FVC RATIO: 81 %
Post FEV1/FVC ratio: 84 %
Post FEV6/FVC ratio: 100 %
Pre FEV6/FVC Ratio: 100 %
RV % PRED: 69 %
RV: 1.7 L
TLC % pred: 84 %
TLC: 4.12 L

## 2018-10-03 MED ORDER — ALBUTEROL SULFATE (2.5 MG/3ML) 0.083% IN NEBU
2.5000 mg | INHALATION_SOLUTION | Freq: Once | RESPIRATORY_TRACT | Status: AC
  Administered 2018-10-03: 2.5 mg via RESPIRATORY_TRACT

## 2018-10-03 MED ORDER — IOHEXOL 350 MG/ML SOLN
95.0000 mL | Freq: Once | INTRAVENOUS | Status: AC | PRN
  Administered 2018-10-03: 95 mL via INTRAVENOUS

## 2018-10-05 ENCOUNTER — Encounter: Payer: Medicare Other | Admitting: Surgery

## 2018-10-05 ENCOUNTER — Ambulatory Visit: Payer: Medicare Other | Admitting: Physical Therapy

## 2018-10-09 ENCOUNTER — Encounter: Payer: Self-pay | Admitting: Thoracic Surgery (Cardiothoracic Vascular Surgery)

## 2018-10-09 ENCOUNTER — Encounter: Payer: Self-pay | Admitting: Physical Therapy

## 2018-10-09 ENCOUNTER — Other Ambulatory Visit: Payer: Self-pay

## 2018-10-09 ENCOUNTER — Ambulatory Visit: Payer: Medicare Other | Attending: Cardiovascular Disease | Admitting: Physical Therapy

## 2018-10-09 ENCOUNTER — Institutional Professional Consult (permissible substitution) (INDEPENDENT_AMBULATORY_CARE_PROVIDER_SITE_OTHER): Payer: Medicare Other | Admitting: Thoracic Surgery (Cardiothoracic Vascular Surgery)

## 2018-10-09 VITALS — BP 130/72 | HR 74 | Resp 20 | Ht 63.0 in | Wt 140.0 lb

## 2018-10-09 DIAGNOSIS — I2510 Atherosclerotic heart disease of native coronary artery without angina pectoris: Secondary | ICD-10-CM | POA: Diagnosis not present

## 2018-10-09 DIAGNOSIS — I350 Nonrheumatic aortic (valve) stenosis: Secondary | ICD-10-CM | POA: Diagnosis not present

## 2018-10-09 DIAGNOSIS — R2689 Other abnormalities of gait and mobility: Secondary | ICD-10-CM | POA: Insufficient documentation

## 2018-10-09 NOTE — Patient Instructions (Signed)
Continue all previous medications without any changes at this time  

## 2018-10-09 NOTE — Progress Notes (Signed)
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 Administrations occurring from 09/27/18 1202 to 09/27/18 1344)  Medication Rate/Dose/Volume Action  Date Time   fentaNYL (SUBLIMAZE) injection (mcg) 25 mcg Given 09/27/18 1231   Total dose as of 10/09/18 1256 25 mcg Given 1302   50 mcg        midazolam (VERSED) injection (mg) 1 mg Given 09/27/18 1232   Total dose as of 10/09/18 1256 1 mg Given 1301   2 mg        lidocaine (PF) (XYLOCAINE) 1 % injection (mL) 2 mL Given 09/27/18 1232   Total dose as of 10/09/18 1256 15 mL Given 1254   19 mL 2 mL Given 1306   heparin injection (Units) 3,000 Units Given 09/27/18 1309   Total dose as of 10/09/18 1256         3,000 Units        iopamidol (ISOVUE-370) 76 % injection (mL) 70 mL Given 09/27/18 1330   Total dose as of 10/09/18 1256        70 mL        Heparin (Porcine) in NaCl 1000-0.9 UT/500ML-% SOLN (mL) 500 mL Given 09/27/18 1341   Total dose as of 10/09/18 1256 500 mL Given 1341   1,000 mL        Radial Cocktail/Verapamil only (mL)  Given 09/27/18 1306   Total dose as of 10/09/18 1256        Cannot be calculated        Sedation Time   Sedation Time Physician-1: 54 minutes 57 seconds  Complications   Complications documented before study signed (09/27/2018 1:46 PM EST)    RIGHT/LEFT HEART CATH AND CORONARY ANGIOGRAPHY   None Documented by Burnell Blanks, MD 09/27/2018 1:30 PM EST  Time Range: Intraprocedure      Coronary Findings   Diagnostic  Dominance: Right  Left Anterior Descending  Vessel is large.  Ost LAD to Prox LAD lesion 20% stenosed  Ost LAD to Prox LAD lesion is 20% stenosed. The lesion is calcified.  Left Circumflex  Vessel is large.  Prox Cx lesion 20% stenosed  Prox Cx lesion is 20% stenosed. The lesion is calcified.  Mid Cx to Dist Cx lesion 20% stenosed  Mid Cx to Dist Cx lesion is 20% stenosed.  Right Coronary Artery  Vessel is large.  Prox RCA to Mid RCA lesion 20% stenosed  Prox RCA to Mid RCA lesion is 20% stenosed. The lesion is calcified.  Dist RCA lesion 20% stenosed  Dist RCA lesion is 20% stenosed. The lesion is calcified.  Intervention   No interventions have been documented.  Coronary Diagrams   Diagnostic  Dominance: Right    Intervention   Implants    No implant documentation for this case.  Syngo Images   Show images for CARDIAC CATHETERIZATION  MERGE Images   Show images for CARDIAC CATHETERIZATION   Link to  Procedure Log   Procedure Log    Hemo Data    Most Recent Value  Fick Cardiac Output 16.64 L/min  Fick Cardiac Output Index 9.98 (L/min)/BSA  Aortic Mean Gradient 21.4 mmHg  Aortic Peak Gradient 29 mmHg   Aortic Valve Area >3.50  Aortic Value Area Index 2.1 cm2/BSA  RA A Wave 9 mmHg  RA V Wave 6 mmHg  RA Mean 4 mmHg  RV Systolic Pressure 33 mmHg  RV Diastolic Pressure 1 mmHg  RV EDP 6 mmHg  PA Systolic Pressure 26 mmHg  PA Diastolic Pressure 9 mmHg  PA Mean 15 mmHg  PW A Wave 12 mmHg  PW V Wave 8 mmHg  PW Mean 8 mmHg  AO Systolic Pressure 621 mmHg  AO Diastolic Pressure 52 mmHg  AO Mean 78 mmHg  LV Systolic Pressure 308 mmHg  LV Diastolic Pressure 4 mmHg  LV EDP 10 mmHg  AOp Systolic Pressure 657 mmHg  AOp Diastolic Pressure 59 mmHg  AOp Mean Pressure 96 mmHg  LVp Systolic Pressure 846 mmHg  LVp Diastolic Pressure 1 mmHg  LVp EDP Pressure 9 mmHg  QP/QS 1  TPVR Index 1.5 HRUI  TSVR Index 7.81 HRUI  PVR SVR Ratio 0.09  TPVR/TSVR Ratio 0.19     Cardiac TAVR CT  TECHNIQUE: The patient was scanned on a [employer]. A 120 kV retrospective scan was triggered in the descending thoracic aorta at 111 HU's. Gantry rotation speed was 250 msecs and collimation was .6 mm. No beta blockade or nitro were given. The 3D data set was reconstructed in 5% intervals of the R-R cycle. Systolic and diastolic phases were analyzed on a dedicated work station using MPR, MIP and VRT modes. The patient received 80 cc of contrast.  FINDINGS: Aortic Valve: Trileaflet, severely thickened and calcified aortic valve with severely restricted leaflet opening and no calcifications extending into the LVOT.  Aorta: Normal size, mild diffuse calcifications, no dissection.  Sinotubular Junction: 24 x 23 mm  Ascending Thoracic Aorta: 30 x 28 mm  Aortic Arch: 26 x 24 mm  Descending Thoracic Aorta: 23 x 22 mm  Sinus of Valsalva Measurements:  Non-coronary: 26 mm  Right -coronary: 26 mm  Left -coronary: 25 mm  Coronary Artery Height above Annulus:  Left Main: 13 mm  Right Coronary: 14 mm  Virtual Basal Annulus Measurements:  Maximum/Minimum Diameter: 23.5 x  19.1 mm  Mean Diameter: 21.1 mm  Perimeter: 67.3  Area: 350 mm2  Optimum Fluoroscopic Angle for Delivery: LAO 24 CAU 13  IMPRESSION: 1. Trileaflet, severely thickened and calcified aortic valve with severely restricted leaflet opening and no calcifications extending into the LVOT. Annular measurements suitable for delivery of a 23 mm Edwards-SAPIEN 3 valve.  2. Calcium score of the aortic valve 2122 consistent with severe aortic stenosis.  3. Sufficient coronary to annulus distance.  4. Optimum Fluoroscopic Angle for Delivery: LAO 24 CAU 13.  5. No thrombus in the left atrial appendage.   Electronically Signed   By: Ena Dawley   On: 10/05/2018 08:50   CT ANGIOGRAPHY CHEST, ABDOMEN AND PELVIS  TECHNIQUE: Multidetector CT imaging through the chest, abdomen and pelvis was performed using the standard protocol during bolus administration of intravenous contrast. Multiplanar reconstructed images and MIPs were obtained and reviewed to evaluate the vascular anatomy.  CONTRAST:  26mL OMNIPAQUE IOHEXOL 350 MG/ML SOLN  COMPARISON:  Chest CT 12/18/2013.  FINDINGS: CTA CHEST FINDINGS  Cardiovascular: Heart size is borderline enlarged. There is no  significant pericardial fluid, thickening or pericardial calcification. There is aortic atherosclerosis, as well as atherosclerosis of the great vessels of the mediastinum and the coronary arteries, including calcified atherosclerotic plaque in the left anterior descending, left circumflex and right coronary arteries. Severe thickening calcification of the aortic valve. Moderate calcifications of the mitral annulus.  Mediastinum/Lymph Nodes: Multiple borderline enlarged and enlarged mediastinal and bilateral hilar lymph nodes measuring up to 2 cm in short axis (low right paratracheal nodal station on axial image 24 of series 16), many of which are partially calcified, similar to the prior study from 2015  and presumably benign (per clinical record patient has a history of sarcoidosis) esophagus is unremarkable in appearance. No axillary lymphadenopathy.  Lungs/Pleura: No suspicious pulmonary nodules or masses are noted. No acute consolidative airspace disease. No pleural effusions.  Musculoskeletal/Soft Tissues: There are no aggressive appearing lytic or blastic lesions noted in the visualized portions of the skeleton.  CTA ABDOMEN AND PELVIS FINDINGS  Hepatobiliary: No suspicious cystic or solid hepatic lesions. No intra or extrahepatic biliary ductal dilatation. Gallbladder is normal in appearance.  Pancreas: No pancreatic mass. No pancreatic ductal dilatation. No pancreatic or peripancreatic fluid or inflammatory changes.  Spleen: Unremarkable.  Adrenals/Urinary Tract: Bilateral kidneys and bilateral adrenal glands are normal in appearance. No hydroureteronephrosis. Urinary bladder is normal in appearance.  Stomach/Bowel: Normal appearance of the stomach. No pathologic dilatation of small bowel or colon. Normal appendix.  Vascular/Lymphatic: Aortic atherosclerosis, without evidence of aneurysm or dissection noted in the abdominal or pelvic vasculature. Vascular findings and measurements pertinent to potential TAVR procedure, as detailed below. No lymphadenopathy noted in the abdomen or pelvis.  Reproductive: Uterus and ovaries are unremarkable in appearance.  Other: No significant volume of ascites.  No pneumoperitoneum.  Musculoskeletal: There are no aggressive appearing lytic or blastic lesions noted in the visualized portions of the skeleton.  VASCULAR MEASUREMENTS PERTINENT TO TAVR:  AORTA:  Minimal Aortic Diameter-12 x 12 mm  Severity of Aortic Calcification-moderate to severe  RIGHT PELVIS:  Right Common Iliac Artery -  Minimal Diameter-10.1 x 9.3 mm  Tortuosity-mild  Calcification-moderate  Right External Iliac Artery  -  Minimal Diameter-5.5 x 5.7 mm  Tortuosity-moderate to severe  Calcification-none  Right Common Femoral Artery -  Minimal Diameter-6.7 x 6.5 mm  Tortuosity-mild  Calcification-minimal  LEFT PELVIS:  Left Common Iliac Artery -  Minimal Diameter-10.4 x 9.2 mm  Tortuosity-mild  Calcification-mild  Left External Iliac Artery -  Minimal Diameter-5.9 x 6.1 mm  Tortuosity-severe  Calcification-minimal  Left Common Femoral Artery -  Minimal Diameter-7.0 x 6.8 mm  Tortuosity-mild  Calcification-minimal  Review of the MIP images confirms the above findings.  IMPRESSION: 1. Vascular findings and measurements pertinent to potential TAVR procedure, as detailed above. 2. Severe thickening calcification of the aortic valve, compatible with the reported clinical history of severe aortic stenosis. 3. Aortic atherosclerosis, in addition to three-vessel coronary artery disease. Please 4. There is also moderate calcifications of the mitral annulus. 5. Multiple borderline enlarged and mildly enlarged mediastinal and bilateral hilar lymph nodes, many of which are partially calcified, presumably related to patient's history of sarcoidosis. 6. Additional incidental findings, as above.   Electronically Signed   By: Vinnie Langton M.D.   On: 10/03/2018 14:18   Impression:  Patient has stage D severe symptomatic aortic stenosis.  She describes recent development of symptoms of exertional shortness of breath and fatigue consistent with chronic diastolic congestive heart failure, New York Heart Association functional class II.  She is otherwise physically active and doing remarkably well for her age.  I have personally reviewed the patient's recent transthoracic echocardiogram, diagnostic cardiac catheterization, and CT angiograms.  The aortic valve is trileaflet with severe thickening, calcification, and restricted leaflet mobility involving all 3  leaflets of the aortic valve.  Peak velocity across the aortic valve measured 4.3 m/s corresponding to mean transvalvular gradient estimated 41 mmHg.  Left ventricular systolic function remains normal.  Diagnostic cardiac catheterization is notable for mild nonobstructive coronary artery disease.  I agree the patient needs aortic valve replacement.  Risks associated with conventional surgery would be moderately elevated because of the patient's age, but overall I suspect the patient could do well with either open surgery or transcatheter valve replacement.  Cardiac-gated CTA of the heart reveals anatomical characteristics consistent with aortic stenosis suitable for treatment by transcatheter aortic valve replacement without any significant complicating features and CTA of the aorta and iliac vessels demonstrate what appears to be adequate pelvic vascular access to facilitate a transfemoral approach.    Plan:  The patient and her daughter were counseled at length regarding treatment alternatives for management of severe symptomatic aortic stenosis. Alternative approaches such as conventional aortic valve replacement, transcatheter aortic valve replacement, and continued medical therapy without intervention were compared and contrasted at length.  The risks associated with conventional surgical aortic valve replacement were discussed in detail, as were expectations for post-operative convalescence.  Issues specific to transcatheter aortic valve replacement were discussed including questions about long term valve durability, the potential for paravalvular leak, possible increased risk of need for permanent pacemaker placement, and other technical complications related to the procedure itself.  Long-term prognosis with medical therapy was discussed. This discussion was placed in the context of the patient's own specific clinical presentation and past medical history.  All of their questions have been addressed.   The patient desires to proceed with transcatheter aortic valve replacement as soon as practical.  All of her preoperative testing has been completed and we can make plans for surgery once issues related to scheduling as a result of the COVID19 virus pandemic have been answered.  Following the decision to proceed with transcatheter aortic valve replacement, a discussion has been held regarding what types of management strategies would be attempted intraoperatively in the event of life-threatening complications, including whether or not the patient would be considered a candidate for the use of cardiopulmonary bypass and/or conversion to open sternotomy for attempted surgical intervention.  The patient has been advised of a variety of complications that might develop including but not limited to risks of death, stroke, paravalvular leak, aortic dissection or other major vascular complications, aortic annulus rupture, device embolization, cardiac rupture or perforation, mitral regurgitation, acute myocardial infarction, arrhythmia, heart block or bradycardia requiring permanent pacemaker placement, congestive heart failure, respiratory failure, renal failure, pneumonia, infection, other late complications related to structural valve deterioration or migration, or other complications that might ultimately cause a temporary or permanent loss of functional independence or other long term morbidity.  The patient provides full informed consent for the procedure as described and all questions were answered.    I spent in excess of 90 minutes during the conduct of this office consultation and >50% of this time involved direct face-to-face encounter with the patient for counseling and/or coordination of their care.    Valentina Gu. Roxy Manns, MD 10/09/2018 12:55 PM
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
## 2018-10-18 ENCOUNTER — Other Ambulatory Visit: Payer: Self-pay

## 2018-10-18 MED ORDER — ROSUVASTATIN CALCIUM 40 MG PO TABS: ORAL_TABLET | ORAL | 2 refills | Status: DC

## 2018-10-20 ENCOUNTER — Telehealth: Payer: Self-pay | Admitting: Cardiovascular Disease

## 2018-10-20 NOTE — Telephone Encounter (Signed)
Informed patient's daughter that current COVID-19 restrictions are in place until 4/17 and could extend further.  She understands to call if her mother's symptoms worsen in the meantime.  She was grateful for call.

## 2018-10-20 NOTE — Telephone Encounter (Signed)
New Message   Patient's daughter calling stating patient is suppose to have a TAVR procedure done and she will like to know when whill it be scheduled.

## 2018-10-23 ENCOUNTER — Ambulatory Visit: Payer: Medicare Other | Admitting: Family Medicine

## 2018-10-26 ENCOUNTER — Telehealth: Payer: Self-pay

## 2018-10-26 NOTE — Telephone Encounter (Signed)
Sounds like we should move forward with TAVR. What do you think Cub?

## 2018-10-26 NOTE — Telephone Encounter (Signed)
  [facility]   MULTIDISCIPLINARY HEART VALVE TEAM  I contacted the pt's daughter Tammy to discuss how her mother has been feeling this week. The pt is pending TAVR but due to Covid-19 restrictions we elected to hold off on proceeding with surgery at this time.  Per Tammy the pt has not left her home.  Tammy is bringing groceries and food to the pt's home and checking on her parents frequently.  Per Tammy the pt has commented about symptoms that she has noticed. The pt has complained of worsening SOB when lying down and is now propping herself up in bed.  Also, the pt feels SOB when lying on her left side.  The pt is no longer going up the stairs due to worsening SOB.  The pt has also mentioned chest discomfort with her SOB.    Dr Roxy Manns evaluated the pt on 10/09/2018:   Patient is married and lives locally in [place] with her husband who suffers from [place].  She is his primary caregiver.  She has been physically active and functionally independent all of her life.  She does admit to recent development of decreased exercise tolerance, increased fatigue, and mild exertional shortness of breath such as when she goes up a flight of stairs.  She has had some mild exertional tightness across her chest as well.  She feels short of breath if she tries to lay flat in bed.  She denies any resting shortness of breath or PND.  She denies any dizzy spells or syncope.  At this time the pt does not weigh on a daily basis.  I have asked Tammy to have the pt weigh daily in the morning. Per medication list the pt is taking Spironolactone 25mg  at bedtime.   09/21/18 Creat 1.08/BUN 18.  I will forward this information to Dr Burt Knack and Dr Roxy Manns for review and further recommendations. Tammy agreed with plan.

## 2018-10-27 ENCOUNTER — Telehealth: Payer: Self-pay | Admitting: Cardiovascular Disease

## 2018-10-27 ENCOUNTER — Encounter: Payer: Self-pay | Admitting: Cardiovascular Disease

## 2018-10-27 NOTE — Telephone Encounter (Signed)
Please see 10/27/18 telephone note from Dr Burt Knack.

## 2018-10-27 NOTE — Telephone Encounter (Signed)
I spoke with the patient at length.  She has some progression of shortness of breath with activity, but she is not short of breath at rest.  She is able to do low-level activity in her house without symptoms.  The patient has some discomfort in her chest only when she lies on her left side.  She denies symptoms of orthopnea, edema, or PND.  We discussed the need for TAVR and the current restrictions related to the Covid-19 pandemic.  I think at this point the most prudent course is to continue to monitor her symptoms closely and wait for the restrictions to be lifted before proceeding with TAVR.  She understands to call if she becomes increasingly symptomatic.  I called her daughter and discussed this with her as well.  The patient's daughter is also in agreement with the plan of watchful waiting and close observation.  Sherren Mocha 10/27/2018 2:03 PM

## 2018-11-20 ENCOUNTER — Other Ambulatory Visit: Payer: Self-pay

## 2018-11-20 DIAGNOSIS — I350 Nonrheumatic aortic (valve) stenosis: Secondary | ICD-10-CM
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
## 2018-11-22 ENCOUNTER — Other Ambulatory Visit: Payer: Self-pay | Admitting: Physician Assistant

## 2018-11-22 NOTE — Pre-Procedure Instructions (Signed)
Brenda [name]  11/22/2018      [id number] - [place], [address] AT [address] [address] Phone: [phone] Fax: [phone]    Your procedure is scheduled on Tues., Nov 28, 2018 from 10:00AM-11:54AM  Report to [facility] Entrance "A" at 8:00AM  Call this number if you have problems the morning of surgery:  [phone]   Remember:  Do not eat or drink after midnight on May 4th    Take these medicines the morning of surgery with A SIP OF WATER: NONE  As of today, stop taking all Aspirin Products, Vitamins, Fish oils, and Herbal medications. Also stop all NSAIDS i.e. Advil, Ibuprofen, Motrin, Aleve, Anaprox, Naproxen, BC, Goody Powders, and all Supplements.   Special instructions:   [facility]- Preparing For Surgery  Before surgery, you can play an important role. Because skin is not sterile, your skin needs to be as free of germs as possible. You can reduce the number of germs on your skin by washing with CHG (chlorahexidine gluconate) Soap before surgery.  CHG is an antiseptic cleaner which kills germs and bonds with the skin to continue killing germs even after washing.    Please do not use if you have an allergy to CHG or antibacterial soaps. If your skin becomes reddened/irritated stop using the CHG.  Do not shave (including legs and underarms) for at least 48 hours prior to first CHG shower. It is OK to shave your face.  Please follow these instructions carefully.   1. Shower the NIGHT BEFORE SURGERY and the MORNING OF SURGERY with CHG.   2. If you chose to wash your hair, wash your hair first as usual with your normal shampoo.  3. After you shampoo, rinse your hair and body thoroughly to remove the shampoo.  4. Use CHG as you would any other liquid soap. You can apply CHG directly to the skin and wash gently with a scrungie or a clean washcloth.   5. Apply the CHG  Soap to your body ONLY FROM THE NECK DOWN.  Do not use on open wounds or open sores. Avoid contact with your eyes, ears, mouth and genitals (private parts). Wash Face and genitals (private parts)  with your normal soap.  6. Wash thoroughly, paying special attention to the area where your surgery will be performed.  7. Thoroughly rinse your body with warm water from the neck down.  8. DO NOT shower/wash with your normal soap after using and rinsing off the CHG Soap.  9. Pat yourself dry with a CLEAN TOWEL.  10. Wear CLEAN PAJAMAS to bed the night before surgery, wear comfortable clothes the morning of surgery  11. Place CLEAN SHEETS on your bed the night of your first shower and DO NOT SLEEP WITH PETS.  Day of Surgery:  Oral Hygiene is also important to reduce your risk of infection.  Remember - BRUSH YOUR TEETH THE MORNING OF SURGERY WITH YOUR REGULAR TOOTHPASTE   Do not wear jewelry, make-up or nail polish.  Do not wear lotions, powders, or perfumes, or deodorant.  Do not shave 48 hours prior to surgery.    Do not bring valuables to the hospital.             Please wear clean clothes to the hospital/surgery center.  [facility] is not responsible for any belongings or valuables.  Contacts, dentures or bridgework may not  be worn into surgery.  Leave your suitcase in the car.  After surgery it may be brought to your room.  For patients admitted to the hospital, discharge time will be determined by your treatment team.  Patients discharged the day of surgery will not be allowed to drive home.   Please read over the following fact sheets that you were given. Pain Booklet, Coughing and Deep Breathing, MRSA Information and Surgical Site Infection Prevention

## 2018-11-23 ENCOUNTER — Encounter (HOSPITAL_COMMUNITY)
Admission: RE | Admit: 2018-11-23 | Discharge: 2018-11-23 | Disposition: A | Discharge status: Home / Self Care | Payer: Medicare Other | Source: Ambulatory Visit | Attending: Cardiovascular Disease | Admitting: Cardiovascular Disease

## 2018-11-23 ENCOUNTER — Other Ambulatory Visit: Payer: Self-pay

## 2018-11-23 ENCOUNTER — Ambulatory Visit (HOSPITAL_COMMUNITY)
Admission: RE | Admit: 2018-11-23 | Discharge: 2018-11-23 | Disposition: A | Discharge status: Home / Self Care | Payer: Medicare Other | Source: Ambulatory Visit | Attending: Cardiovascular Disease | Admitting: Cardiovascular Disease

## 2018-11-23 ENCOUNTER — Encounter (HOSPITAL_COMMUNITY): Payer: Self-pay

## 2018-11-23 DIAGNOSIS — Z1159 Encounter for screening for other viral diseases: Secondary | ICD-10-CM | POA: Diagnosis not present

## 2018-11-23 DIAGNOSIS — E785 Hyperlipidemia, unspecified: Secondary | ICD-10-CM | POA: Insufficient documentation

## 2018-11-23 DIAGNOSIS — I10 Essential (primary) hypertension: Secondary | ICD-10-CM | POA: Insufficient documentation

## 2018-11-23 DIAGNOSIS — I350 Nonrheumatic aortic (valve) stenosis: Secondary | ICD-10-CM | POA: Insufficient documentation

## 2018-11-23 DIAGNOSIS — I2510 Atherosclerotic heart disease of native coronary artery without angina pectoris: Secondary | ICD-10-CM | POA: Diagnosis not present

## 2018-11-23 DIAGNOSIS — R590 Localized enlarged lymph nodes: Secondary | ICD-10-CM | POA: Insufficient documentation

## 2018-11-23 DIAGNOSIS — K589 Irritable bowel syndrome without diarrhea: Secondary | ICD-10-CM | POA: Diagnosis not present

## 2018-11-23 DIAGNOSIS — Z79899 Other long term (current) drug therapy: Secondary | ICD-10-CM | POA: Diagnosis not present

## 2018-11-23 DIAGNOSIS — F419 Anxiety disorder, unspecified: Secondary | ICD-10-CM | POA: Insufficient documentation

## 2018-11-23 DIAGNOSIS — J45909 Unspecified asthma, uncomplicated: Secondary | ICD-10-CM | POA: Diagnosis not present

## 2018-11-23 DIAGNOSIS — I700 Atherosclerosis of aorta: Secondary | ICD-10-CM | POA: Insufficient documentation

## 2018-11-23 DIAGNOSIS — M1990 Unspecified osteoarthritis, unspecified site: Secondary | ICD-10-CM | POA: Diagnosis not present

## 2018-11-23 DIAGNOSIS — Z01818 Encounter for other preprocedural examination: Secondary | ICD-10-CM | POA: Diagnosis not present

## 2018-11-23 DIAGNOSIS — D869 Sarcoidosis, unspecified: Secondary | ICD-10-CM | POA: Insufficient documentation

## 2018-11-23 HISTORY — DX: Cardiac murmur, unspecified: R01.1

## 2018-11-23 HISTORY — DX: Malignant (primary) neoplasm, unspecified: C80.1

## 2018-11-23 LAB — BLOOD GAS, ARTERIAL
Acid-Base Excess: 0.5 mmol/L (ref 0.0–2.0)
Bicarbonate: 24.5 mmol/L (ref 20.0–28.0)
Drawn by: 470591
FIO2: 21
O2 Saturation: 95.2 %
Patient temperature: 98.6
pCO2 arterial: 38.8 mmHg (ref 32.0–48.0)
pH, Arterial: 7.417 (ref 7.350–7.450)
pO2, Arterial: 78.1 mmHg — ABNORMAL LOW (ref 83.0–108.0)

## 2018-11-23 LAB — TYPE AND SCREEN
ABO/RH(D): O POS
Antibody Screen: NEGATIVE

## 2018-11-23 LAB — COMPREHENSIVE METABOLIC PANEL
ALT: 27 U/L (ref 0–44)
AST: 34 U/L (ref 15–41)
Albumin: 4 g/dL (ref 3.5–5.0)
Alkaline Phosphatase: 47 U/L (ref 38–126)
Anion gap: 15 (ref 5–15)
BUN: 20 mg/dL (ref 8–23)
CO2: 19 mmol/L — ABNORMAL LOW (ref 22–32)
Calcium: 9.4 mg/dL (ref 8.9–10.3)
Chloride: 103 mmol/L (ref 98–111)
Creatinine, Ser: 1.08 mg/dL — ABNORMAL HIGH (ref 0.44–1.00)
GFR calc Af Amer: 54 mL/min — ABNORMAL LOW (ref >60)
GFR calc non Af Amer: 47 mL/min — ABNORMAL LOW (ref >60)
Glucose, Bld: 98 mg/dL (ref 70–99)
Potassium: 3.9 mmol/L (ref 3.5–5.1)
Sodium: 137 mmol/L (ref 135–145)
Total Bilirubin: 0.9 mg/dL (ref 0.3–1.2)
Total Protein: 7.1 g/dL (ref 6.5–8.1)

## 2018-11-23 LAB — APTT: aPTT: 35 seconds (ref 24–36)

## 2018-11-23 LAB — URINALYSIS, ROUTINE W REFLEX MICROSCOPIC
Bacteria, UA: NONE SEEN
Bilirubin Urine: NEGATIVE
Glucose, UA: NEGATIVE mg/dL
Ketones, ur: NEGATIVE mg/dL
Leukocytes,Ua: NEGATIVE
Nitrite: NEGATIVE
Protein, ur: NEGATIVE mg/dL
Specific Gravity, Urine: 1.011 (ref 1.005–1.030)
pH: 5 (ref 5.0–8.0)

## 2018-11-23 LAB — HEMOGLOBIN A1C
Hgb A1c MFr Bld: 5.4 % (ref 4.8–5.6)
Mean Plasma Glucose: 108.28 mg/dL

## 2018-11-23 LAB — PROTIME-INR
INR: 1.1 (ref 0.8–1.2)
Prothrombin Time: 14 seconds (ref 11.4–15.2)

## 2018-11-23 LAB — CBC
HCT: 39.9 % (ref 36.0–46.0)
Hemoglobin: 13.1 g/dL (ref 12.0–15.0)
MCH: 30.8 pg (ref 26.0–34.0)
MCHC: 32.8 g/dL (ref 30.0–36.0)
MCV: 93.9 fL (ref 80.0–100.0)
Platelets: 158 10*3/uL (ref 150–400)
RBC: 4.25 MIL/uL (ref 3.87–5.11)
RDW: 12.7 % (ref 11.5–15.5)
WBC: 6.5 10*3/uL (ref 4.0–10.5)
nRBC: 0 % (ref 0.0–0.2)

## 2018-11-23 LAB — SURGICAL PCR SCREEN
MRSA, PCR: NEGATIVE
Staphylococcus aureus: NEGATIVE

## 2018-11-23 LAB — BRAIN NATRIURETIC PEPTIDE: B Natriuretic Peptide: 37.4 pg/mL (ref 0.0–100.0)

## 2018-11-23 NOTE — Progress Notes (Signed)
PCP - Dr. [name] Cardiologist - Dr. [name]  Chest x-ray - 11/23/2018 EKG - 11/23/2018 Stress Test - 01/18/2011 ECHO - 06/19/18 Cardiac Cath - 09/27/2018  Sleep Study - denies CPAP - denies  Blood Thinner Instructions: N/A Aspirin Instructions: N/A  Anesthesia review: YES, heart hx  Coronavirus Screening  Have you experienced the following symptoms:  Cough yes/no: No Fever (>100.76F)  yes/no: No Runny nose yes/no: No Sore throat yes/no: No Difficulty breathing/shortness of breath  yes/no: No  Have you or a family member traveled in the last 14 days and where? yes/no: No  If the patient indicates "YES" to the above questions, their PAT will be rescheduled to limit the exposure to others and, the surgeon will be notified. THE PATIENT WILL NEED TO BE ASYMPTOMATIC FOR 14 DAYS.   If the patient is not experiencing any of these symptoms, the PAT nurse will instruct them to NOT bring anyone with them to their appointment since they may have these symptoms or traveled as well.   Please remind your patients and families that hospital visitation restrictions are in effect and the importance of the restrictions.   Patient denies shortness of breath, fever, cough and chest pain at PAT appointment  Patient verbalized understanding of instructions that were given to them at the PAT appointment. Patient was also instructed that they will need to review over the PAT instructions again at home before surgery.
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
## 2018-11-24 LAB — NOVEL CORONAVIRUS, NAA (HOSP ORDER, SEND-OUT TO REF LAB; TAT 18-24 HRS): SARS-CoV-2, NAA: NOT DETECTED

## 2018-11-27 ENCOUNTER — Telehealth: Payer: Self-pay | Admitting: Cardiovascular Disease

## 2018-11-27 MED ORDER — DEXMEDETOMIDINE HCL IN NACL 400 MCG/100ML IV SOLN
0.1000 ug/kg/h | INTRAVENOUS | Status: AC
  Administered 2018-11-28: 1 ug/kg/h via INTRAVENOUS
  Filled 2018-11-27: qty 100

## 2018-11-27 MED ORDER — VANCOMYCIN HCL 10 G IV SOLR
1250.0000 mg | INTRAVENOUS | Status: AC
  Administered 2018-11-28: 1250 mg via INTRAVENOUS
  Filled 2018-11-27: qty 1250

## 2018-11-27 MED ORDER — POTASSIUM CHLORIDE 2 MEQ/ML IV SOLN
80.0000 meq | INTRAVENOUS | Status: DC
  Filled 2018-11-27: qty 40

## 2018-11-27 MED ORDER — MAGNESIUM SULFATE 50 % IJ SOLN
40.0000 meq | INTRAMUSCULAR | Status: DC
  Filled 2018-11-27: qty 9.85

## 2018-11-27 MED ORDER — NOREPINEPHRINE BITARTRATE 1 MG/ML IV SOLN
0.0000 ug/min | INTRAVENOUS | Status: DC
  Filled 2018-11-27: qty 4

## 2018-11-27 MED ORDER — SODIUM CHLORIDE 0.9 % IV SOLN
INTRAVENOUS | Status: DC
  Filled 2018-11-27: qty 30

## 2018-11-27 MED ORDER — SODIUM CHLORIDE 0.9 % IV SOLN
1.5000 g | INTRAVENOUS | Status: AC
  Administered 2018-11-28: 1.5 g via INTRAVENOUS
  Filled 2018-11-27: qty 1.5

## 2018-11-27 NOTE — Telephone Encounter (Signed)
I spoke with Tammy and made her aware that the pt was not prescribed Mupirocin because her Surgical pcr Screen was negative for MRSA and Staph.  Per Tammy the pt was very confused with all the information that was reviewed during PAT apt.  I reviewed pre surgery instructions with Tammy and answered questions.   Tammy is afraid that the pt will be confused after her surgery.  I advised Tammy that the hospital staff can ask for an exception if needed to allow Tammy to remain with the pt. Tammy's contact number is [phone].

## 2018-11-27 NOTE — Telephone Encounter (Signed)
Daughter called. Pt has Tavr (aortic valve replacement) scheduled for tomorrow. Daughter was given information for pre-op, and it stated that the pt will be getting antibiotics (Mupirocin). She is concerned because no one has called those meds in yet, and she just wants to make sure the meds will be safe for her mom post procedure

## 2018-11-28 ENCOUNTER — Inpatient Hospital Stay (HOSPITAL_COMMUNITY): Payer: Medicare Other | Admitting: Vascular Surgery

## 2018-11-28 ENCOUNTER — Inpatient Hospital Stay (HOSPITAL_COMMUNITY)
Admission: RE | Admit: 2018-11-28 | Discharge: 2018-11-29 | DRG: 267 | Disposition: A | Discharge status: Home / Self Care | Payer: Medicare Other | Attending: Cardiovascular Disease | Admitting: Cardiovascular Disease

## 2018-11-28 ENCOUNTER — Encounter (HOSPITAL_COMMUNITY)
Admission: RE | Disposition: A | Discharge status: Home / Self Care | Payer: Self-pay | Source: Home / Self Care | Attending: Cardiovascular Disease

## 2018-11-28 ENCOUNTER — Inpatient Hospital Stay (HOSPITAL_COMMUNITY): Payer: Medicare Other

## 2018-11-28 ENCOUNTER — Encounter (HOSPITAL_COMMUNITY): Payer: Self-pay | Admitting: *Deleted

## 2018-11-28 ENCOUNTER — Inpatient Hospital Stay (HOSPITAL_COMMUNITY): Payer: Medicare Other | Admitting: Anesthesiology

## 2018-11-28 DIAGNOSIS — I2510 Atherosclerotic heart disease of native coronary artery without angina pectoris: Secondary | ICD-10-CM | POA: Diagnosis present

## 2018-11-28 DIAGNOSIS — Z79899 Other long term (current) drug therapy: Secondary | ICD-10-CM

## 2018-11-28 DIAGNOSIS — E039 Hypothyroidism, unspecified: Secondary | ICD-10-CM | POA: Diagnosis not present

## 2018-11-28 DIAGNOSIS — I5032 Chronic diastolic (congestive) heart failure: Secondary | ICD-10-CM | POA: Diagnosis not present

## 2018-11-28 DIAGNOSIS — Z01810 Encounter for preprocedural cardiovascular examination: Secondary | ICD-10-CM | POA: Diagnosis not present

## 2018-11-28 DIAGNOSIS — F419 Anxiety disorder, unspecified: Secondary | ICD-10-CM | POA: Diagnosis present

## 2018-11-28 DIAGNOSIS — S065X9A Traumatic subdural hemorrhage with loss of consciousness of unspecified duration, initial encounter: Secondary | ICD-10-CM | POA: Diagnosis present

## 2018-11-28 DIAGNOSIS — Z79890 Hormone replacement therapy (postmenopausal): Secondary | ICD-10-CM

## 2018-11-28 DIAGNOSIS — I350 Nonrheumatic aortic (valve) stenosis: Secondary | ICD-10-CM | POA: Diagnosis not present

## 2018-11-28 DIAGNOSIS — Z8679 Personal history of other diseases of the circulatory system: Secondary | ICD-10-CM

## 2018-11-28 DIAGNOSIS — J45909 Unspecified asthma, uncomplicated: Secondary | ICD-10-CM | POA: Diagnosis present

## 2018-11-28 DIAGNOSIS — M1990 Unspecified osteoarthritis, unspecified site: Secondary | ICD-10-CM | POA: Diagnosis present

## 2018-11-28 DIAGNOSIS — Z952 Presence of prosthetic heart valve: Secondary | ICD-10-CM

## 2018-11-28 DIAGNOSIS — Z825 Family history of asthma and other chronic lower respiratory diseases: Secondary | ICD-10-CM

## 2018-11-28 DIAGNOSIS — K2270 Barrett's esophagus without dysplasia: Secondary | ICD-10-CM | POA: Diagnosis present

## 2018-11-28 DIAGNOSIS — E890 Postprocedural hypothyroidism: Secondary | ICD-10-CM | POA: Diagnosis present

## 2018-11-28 DIAGNOSIS — R0602 Shortness of breath: Secondary | ICD-10-CM | POA: Diagnosis present

## 2018-11-28 DIAGNOSIS — E785 Hyperlipidemia, unspecified: Secondary | ICD-10-CM | POA: Diagnosis present

## 2018-11-28 DIAGNOSIS — Z006 Encounter for examination for normal comparison and control in clinical research program: Secondary | ICD-10-CM | POA: Diagnosis not present

## 2018-11-28 DIAGNOSIS — D869 Sarcoidosis, unspecified: Secondary | ICD-10-CM | POA: Diagnosis not present

## 2018-11-28 DIAGNOSIS — Z8619 Personal history of other infectious and parasitic diseases: Secondary | ICD-10-CM | POA: Diagnosis not present

## 2018-11-28 DIAGNOSIS — K589 Irritable bowel syndrome without diarrhea: Secondary | ICD-10-CM | POA: Diagnosis present

## 2018-11-28 DIAGNOSIS — Z86010 Personal history of colonic polyps: Secondary | ICD-10-CM

## 2018-11-28 DIAGNOSIS — I110 Hypertensive heart disease with heart failure: Secondary | ICD-10-CM | POA: Diagnosis present

## 2018-11-28 DIAGNOSIS — Z87828 Personal history of other (healed) physical injury and trauma: Secondary | ICD-10-CM

## 2018-11-28 DIAGNOSIS — Z8249 Family history of ischemic heart disease and other diseases of the circulatory system: Secondary | ICD-10-CM | POA: Diagnosis not present

## 2018-11-28 DIAGNOSIS — I10 Essential (primary) hypertension: Secondary | ICD-10-CM | POA: Diagnosis not present

## 2018-11-28 DIAGNOSIS — K219 Gastro-esophageal reflux disease without esophagitis: Secondary | ICD-10-CM | POA: Diagnosis not present

## 2018-11-28 DIAGNOSIS — S065XAA Traumatic subdural hemorrhage with loss of consciousness status unknown, initial encounter: Secondary | ICD-10-CM | POA: Diagnosis present

## 2018-11-28 HISTORY — PX: TRANSCATHETER AORTIC VALVE REPLACEMENT, TRANSFEMORAL: SHX6400

## 2018-11-28 HISTORY — PX: TEE WITHOUT CARDIOVERSION: SHX5443

## 2018-11-28 HISTORY — DX: Presence of prosthetic heart valve: Z95.2

## 2018-11-28 HISTORY — DX: Personal history of other (healed) physical injury and trauma: Z87.828

## 2018-11-28 HISTORY — DX: Personal history of other diseases of the circulatory system: Z86.79

## 2018-11-28 LAB — POCT I-STAT 4, (NA,K, GLUC, HGB,HCT)
Glucose, Bld: 103 mg/dL — ABNORMAL HIGH (ref 70–99)
Glucose, Bld: 98 mg/dL (ref 70–99)
Glucose, Bld: 118 mg/dL — ABNORMAL HIGH (ref 70–99)
Glucose, Bld: 119 mg/dL — ABNORMAL HIGH (ref 70–99)
HCT: 40 % (ref 36.0–46.0)
HCT: 34 % — ABNORMAL LOW (ref 36.0–46.0)
HCT: 31 % — ABNORMAL LOW (ref 36.0–46.0)
HCT: 32 % — ABNORMAL LOW (ref 36.0–46.0)
Hemoglobin: 13.6 g/dL (ref 12.0–15.0)
Hemoglobin: 11.6 g/dL — ABNORMAL LOW (ref 12.0–15.0)
Hemoglobin: 10.5 g/dL — ABNORMAL LOW (ref 12.0–15.0)
Hemoglobin: 10.9 g/dL — ABNORMAL LOW (ref 12.0–15.0)
Potassium: 3.8 mmol/L (ref 3.5–5.1)
Potassium: 3.7 mmol/L (ref 3.5–5.1)
Potassium: 3.8 mmol/L (ref 3.5–5.1)
Potassium: 4 mmol/L (ref 3.5–5.1)
Sodium: 141 mmol/L (ref 135–145)
Sodium: 140 mmol/L (ref 135–145)
Sodium: 141 mmol/L (ref 135–145)
Sodium: 141 mmol/L (ref 135–145)

## 2018-11-28 LAB — POCT I-STAT CREATININE: Creatinine, Ser: 0.9 mg/dL (ref 0.44–1.00)

## 2018-11-28 LAB — POCT I-STAT 7, (LYTES, BLD GAS, ICA,H+H)
Acid-base deficit: 3 mmol/L — ABNORMAL HIGH (ref 0.0–2.0)
Bicarbonate: 23.3 mmol/L (ref 20.0–28.0)
Calcium, Ion: 1.22 mmol/L (ref 1.15–1.40)
HCT: 30 % — ABNORMAL LOW (ref 36.0–46.0)
Hemoglobin: 10.2 g/dL — ABNORMAL LOW (ref 12.0–15.0)
O2 Saturation: 90 %
Potassium: 3.7 mmol/L (ref 3.5–5.1)
Sodium: 142 mmol/L (ref 135–145)
TCO2: 25 mmol/L (ref 22–32)
pCO2 arterial: 43.6 mmHg (ref 32.0–48.0)
pH, Arterial: 7.335 — ABNORMAL LOW (ref 7.350–7.450)
pO2, Arterial: 63 mmHg — ABNORMAL LOW (ref 83.0–108.0)

## 2018-11-28 SURGERY — IMPLANTATION, AORTIC VALVE, TRANSCATHETER, FEMORAL APPROACH
Anesthesia: Monitor Anesthesia Care | Site: Chest

## 2018-11-28 MED ORDER — ALPRAZOLAM 0.5 MG PO TABS: 0.5000 mg | ORAL_TABLET | ORAL | Status: DC

## 2018-11-28 MED ORDER — MORPHINE SULFATE (PF) 2 MG/ML IV SOLN: 1.0000 mg | INTRAVENOUS | Status: DC | PRN

## 2018-11-28 MED ORDER — OXYCODONE HCL 5 MG PO TABS
5.0000 mg | ORAL_TABLET | ORAL | Status: DC | PRN
  Administered 2018-11-28: 5 mg via ORAL
  Filled 2018-11-28: qty 1

## 2018-11-28 MED ORDER — PHENYLEPHRINE HCL-NACL 20-0.9 MG/250ML-% IV SOLN
0.0000 ug/min | INTRAVENOUS | Status: DC
  Filled 2018-11-28: qty 250

## 2018-11-28 MED ORDER — HEPARIN SODIUM (PORCINE) 1000 UNIT/ML IJ SOLN
INTRAMUSCULAR | Status: DC | PRN
  Administered 2018-11-28: 7000 [IU] via INTRAVENOUS

## 2018-11-28 MED ORDER — 0.9 % SODIUM CHLORIDE (POUR BTL) OPTIME
TOPICAL | Status: DC | PRN
  Administered 2018-11-28: 1000 mL

## 2018-11-28 MED ORDER — ALPRAZOLAM 0.5 MG PO TABS
0.5000 mg | ORAL_TABLET | Freq: Every day | ORAL | Status: DC
  Administered 2018-11-29: 0.5 mg via ORAL
  Filled 2018-11-28: qty 1

## 2018-11-28 MED ORDER — EPHEDRINE SULFATE-NACL 50-0.9 MG/10ML-% IV SOSY
PREFILLED_SYRINGE | INTRAVENOUS | Status: DC | PRN
  Administered 2018-11-28: 5 mg via INTRAVENOUS

## 2018-11-28 MED ORDER — SODIUM CHLORIDE 0.9% FLUSH: 3.0000 mL | INTRAVENOUS | Status: DC | PRN

## 2018-11-28 MED ORDER — LACTATED RINGERS IV SOLN
INTRAVENOUS | Status: DC | PRN
  Administered 2018-11-28: 09:00:00 via INTRAVENOUS

## 2018-11-28 MED ORDER — IODIXANOL 320 MG/ML IV SOLN
INTRAVENOUS | Status: DC | PRN
  Administered 2018-11-28: 11:00:00 40.6 mL via INTRAVENOUS

## 2018-11-28 MED ORDER — PROPOFOL 500 MG/50ML IV EMUL
INTRAVENOUS | Status: DC | PRN
  Administered 2018-11-28: 10 ug/kg/min via INTRAVENOUS

## 2018-11-28 MED ORDER — VANCOMYCIN HCL IN DEXTROSE 1-5 GM/200ML-% IV SOLN
1000.0000 mg | Freq: Once | INTRAVENOUS | Status: AC
  Administered 2018-11-28: 1000 mg via INTRAVENOUS
  Filled 2018-11-28: qty 200

## 2018-11-28 MED ORDER — SODIUM CHLORIDE 0.9 % IV SOLN
INTRAVENOUS | Status: DC | PRN
  Administered 2018-11-28: 12:00:00 40 ug/min via INTRAVENOUS

## 2018-11-28 MED ORDER — SODIUM CHLORIDE 0.9% FLUSH
3.0000 mL | Freq: Two times a day (BID) | INTRAVENOUS | Status: DC
  Administered 2018-11-28: 3 mL via INTRAVENOUS

## 2018-11-28 MED ORDER — FENTANYL CITRATE (PF) 100 MCG/2ML IJ SOLN
INTRAMUSCULAR | Status: DC | PRN
  Administered 2018-11-28: 50 ug via INTRAVENOUS

## 2018-11-28 MED ORDER — LIDOCAINE HCL (PF) 1 % IJ SOLN
INTRAMUSCULAR | Status: AC
  Filled 2018-11-28: qty 30

## 2018-11-28 MED ORDER — SODIUM CHLORIDE 0.9 % IV SOLN
INTRAVENOUS | Status: DC
  Administered 2018-11-28: 09:00:00 via INTRAVENOUS

## 2018-11-28 MED ORDER — SENNOSIDES-DOCUSATE SODIUM 8.6-50 MG PO TABS
3.0000 | ORAL_TABLET | Freq: Every day | ORAL | Status: DC
  Administered 2018-11-28: 3 via ORAL
  Filled 2018-11-28: qty 3

## 2018-11-28 MED ORDER — SODIUM CHLORIDE 0.9 % IV SOLN
INTRAVENOUS | Status: AC
  Administered 2018-11-28: 50 mL/h via INTRAVENOUS

## 2018-11-28 MED ORDER — CYCLOSPORINE 0.05 % OP EMUL
1.0000 [drp] | Freq: Two times a day (BID) | OPHTHALMIC | Status: DC
  Administered 2018-11-28: 1 [drp] via OPHTHALMIC
  Filled 2018-11-28 (×3): qty 30

## 2018-11-28 MED ORDER — SODIUM CHLORIDE 0.9 % IV SOLN
INTRAVENOUS | Status: AC
  Filled 2018-11-28 (×3): qty 1.2

## 2018-11-28 MED ORDER — ASPIRIN 81 MG PO CHEW: 81.0000 mg | CHEWABLE_TABLET | Freq: Every day | ORAL | Status: DC

## 2018-11-28 MED ORDER — SODIUM CHLORIDE 0.9 % IV SOLN: 250.0000 mL | INTRAVENOUS | Status: DC | PRN

## 2018-11-28 MED ORDER — MORPHINE SULFATE (PF) 10 MG/ML IV SOLN: 1.0000 mg | INTRAVENOUS | Status: DC | PRN

## 2018-11-28 MED ORDER — LEVOTHYROXINE SODIUM 75 MCG PO TABS
75.0000 ug | ORAL_TABLET | ORAL | Status: DC
  Administered 2018-11-29: 75 ug via ORAL
  Filled 2018-11-28: qty 1

## 2018-11-28 MED ORDER — LIDOCAINE HCL (PF) 1 % IJ SOLN
INTRAMUSCULAR | Status: DC | PRN
  Administered 2018-11-28: 15 mL

## 2018-11-28 MED ORDER — ACETAMINOPHEN 650 MG RE SUPP: 650.0000 mg | Freq: Four times a day (QID) | RECTAL | Status: DC | PRN

## 2018-11-28 MED ORDER — CLOPIDOGREL BISULFATE 75 MG PO TABS
75.0000 mg | ORAL_TABLET | Freq: Every day | ORAL | Status: DC
  Administered 2018-11-29: 75 mg via ORAL
  Filled 2018-11-28: qty 1

## 2018-11-28 MED ORDER — ONDANSETRON HCL 4 MG/2ML IJ SOLN: 4.0000 mg | Freq: Four times a day (QID) | INTRAMUSCULAR | Status: DC | PRN

## 2018-11-28 MED ORDER — ESMOLOL HCL 100 MG/10ML IV SOLN
INTRAVENOUS | Status: DC | PRN
  Administered 2018-11-28: 5 mg via INTRAVENOUS

## 2018-11-28 MED ORDER — NITROGLYCERIN IN D5W 200-5 MCG/ML-% IV SOLN: 0.0000 ug/min | INTRAVENOUS | Status: DC

## 2018-11-28 MED ORDER — FENTANYL CITRATE (PF) 250 MCG/5ML IJ SOLN
INTRAMUSCULAR | Status: AC
  Filled 2018-11-28: qty 5

## 2018-11-28 MED ORDER — ROSUVASTATIN CALCIUM 20 MG PO TABS
40.0000 mg | ORAL_TABLET | Freq: Every day | ORAL | Status: DC
  Administered 2018-11-28: 40 mg via ORAL
  Filled 2018-11-28 (×2): qty 2

## 2018-11-28 MED ORDER — PROTAMINE SULFATE 10 MG/ML IV SOLN
INTRAVENOUS | Status: DC | PRN
  Administered 2018-11-28: 70 mg via INTRAVENOUS

## 2018-11-28 MED ORDER — LEVOTHYROXINE SODIUM 88 MCG PO TABS: 88.0000 ug | ORAL_TABLET | ORAL | Status: DC

## 2018-11-28 MED ORDER — CHLORHEXIDINE GLUCONATE 4 % EX LIQD: 30.0000 mL | CUTANEOUS | Status: DC

## 2018-11-28 MED ORDER — CHLORHEXIDINE GLUCONATE 4 % EX LIQD: 60.0000 mL | Freq: Once | CUTANEOUS | Status: DC

## 2018-11-28 MED ORDER — TRAMADOL HCL 50 MG PO TABS: 50.0000 mg | ORAL_TABLET | ORAL | Status: DC | PRN

## 2018-11-28 MED ORDER — ALPRAZOLAM 0.5 MG PO TABS
1.0000 mg | ORAL_TABLET | Freq: Every day | ORAL | Status: DC
  Administered 2018-11-28: 1 mg via ORAL
  Filled 2018-11-28: qty 2

## 2018-11-28 MED ORDER — CHLORHEXIDINE GLUCONATE 0.12 % MT SOLN
15.0000 mL | Freq: Once | OROMUCOSAL | Status: AC
  Administered 2018-11-28: 15 mL via OROMUCOSAL
  Filled 2018-11-28: qty 15

## 2018-11-28 MED ORDER — SODIUM CHLORIDE 0.9 % IV SOLN
1.5000 g | Freq: Two times a day (BID) | INTRAVENOUS | Status: DC
  Administered 2018-11-28: 1.5 g via INTRAVENOUS
  Filled 2018-11-28 (×3): qty 1.5

## 2018-11-28 MED ORDER — SODIUM CHLORIDE 0.9 % IV SOLN
INTRAVENOUS | Status: DC | PRN
  Administered 2018-11-28: 1500 mL

## 2018-11-28 MED ORDER — ACETAMINOPHEN 325 MG PO TABS
650.0000 mg | ORAL_TABLET | Freq: Four times a day (QID) | ORAL | Status: DC | PRN
  Administered 2018-11-28: 650 mg via ORAL
  Filled 2018-11-28: qty 2

## 2018-11-28 MED ORDER — LORATADINE 10 MG PO TABS: 10.0000 mg | ORAL_TABLET | Freq: Every day | ORAL | Status: DC

## 2018-11-28 SURGICAL SUPPLY — 96 items
ADH SKN CLS APL DERMABOND .7 (GAUZE/BANDAGES/DRESSINGS) ×2
BAG DECANTER FOR FLEXI CONT (MISCELLANEOUS) ×1 IMPLANT
BAG SNAP BAND KOVER 36X36 (MISCELLANEOUS) ×6 IMPLANT
BLADE CLIPPER SURG (BLADE) IMPLANT
BLADE STERNUM SYSTEM 6 (BLADE) IMPLANT
BLADE SURG 10 STRL SS (BLADE) ×2 IMPLANT
CABLE ADAPT CONN TEMP 6FT (ADAPTER) ×3 IMPLANT
CANISTER SUCT 3000ML PPV (MISCELLANEOUS) IMPLANT
CANNULA FEM VENOUS REMOTE 22FR (CANNULA) IMPLANT
CANNULA OPTISITE PERFUSION 16F (CANNULA) IMPLANT
CANNULA OPTISITE PERFUSION 18F (CANNULA) IMPLANT
CATH DIAG EXPO 6F VENT PIG 145 (CATHETERS) ×6 IMPLANT
CATH EXPO 5FR AL1 (CATHETERS) ×1 IMPLANT
CATH EXTERNAL FEMALE PUREWICK (CATHETERS) IMPLANT
CATH INFINITI 6F AL2 (CATHETERS) IMPLANT
CATH S G BIP PACING (CATHETERS) ×3 IMPLANT
CHLORAPREP W/TINT 26ML (MISCELLANEOUS) ×3 IMPLANT
CLIP VESOCCLUDE MED 24/CT (CLIP) ×2 IMPLANT
CLIP VESOCCLUDE SM WIDE 24/CT (CLIP) ×2 IMPLANT
CONT SPEC 4OZ CLIKSEAL STRL BL (MISCELLANEOUS) ×7 IMPLANT
COVER BACK TABLE 80X110 HD (DRAPES) ×1 IMPLANT
COVER DOME SNAP 22 D (MISCELLANEOUS) IMPLANT
COVER WAND RF STERILE (DRAPES) ×2 IMPLANT
CRADLE DONUT ADULT HEAD (MISCELLANEOUS) ×3 IMPLANT
DECANTER SPIKE VIAL GLASS SM (MISCELLANEOUS) ×3 IMPLANT
DERMABOND ADVANCED (GAUZE/BANDAGES/DRESSINGS) ×1
DERMABOND ADVANCED .7 DNX12 (GAUZE/BANDAGES/DRESSINGS) ×2 IMPLANT
DEVICE CLOSURE PERCLS PRGLD 6F (VASCULAR PRODUCTS) ×4 IMPLANT
DRAPE INCISE IOBAN 66X45 STRL (DRAPES) IMPLANT
DRSG TEGADERM 4X4.75 (GAUZE/BANDAGES/DRESSINGS) ×5 IMPLANT
ELECT CAUTERY BLADE 6.4 (BLADE) IMPLANT
ELECT REM PT RETURN 9FT ADLT (ELECTROSURGICAL) ×6
ELECTRODE REM PT RTRN 9FT ADLT (ELECTROSURGICAL) ×4 IMPLANT
FELT TEFLON 6X6 (MISCELLANEOUS) ×2 IMPLANT
FEMORAL VENOUS CANN RAP (CANNULA) IMPLANT
GAUZE 4X4 16PLY RFD (DISPOSABLE) ×1 IMPLANT
GAUZE SPONGE 4X4 12PLY STRL (GAUZE/BANDAGES/DRESSINGS) ×3 IMPLANT
GAUZE SPONGE 4X4 12PLY STRL LF (GAUZE/BANDAGES/DRESSINGS) ×1 IMPLANT
GLOVE BIO SURGEON STRL SZ7.5 (GLOVE) IMPLANT
GLOVE BIO SURGEON STRL SZ8 (GLOVE) ×1 IMPLANT
GLOVE EUDERMIC 7 POWDERFREE (GLOVE) IMPLANT
GLOVE ORTHO TXT STRL SZ7.5 (GLOVE) ×1 IMPLANT
GOWN STRL REUS W/ TWL LRG LVL3 (GOWN DISPOSABLE) IMPLANT
GOWN STRL REUS W/ TWL XL LVL3 (GOWN DISPOSABLE) ×2 IMPLANT
GOWN STRL REUS W/TWL LRG LVL3 (GOWN DISPOSABLE) ×6
GOWN STRL REUS W/TWL XL LVL3 (GOWN DISPOSABLE) ×6
GUIDEWIRE SAF TJ AMPL .035X180 (WIRE) ×3 IMPLANT
GUIDEWIRE SAFE TJ AMPLATZ EXST (WIRE) ×3 IMPLANT
GUIDEWIRE STRAIGHT .035 260CM (WIRE) ×3 IMPLANT
INSERT FOGARTY SM (MISCELLANEOUS) IMPLANT
KIT BASIN OR (CUSTOM PROCEDURE TRAY) ×3 IMPLANT
KIT DILATOR VASC 18G NDL (KITS) IMPLANT
KIT HEART LEFT (KITS) ×3 IMPLANT
KIT SUCTION CATH 14FR (SUCTIONS) ×4 IMPLANT
KIT TURNOVER KIT B (KITS) ×3 IMPLANT
LOOP VESSEL MAXI BLUE (MISCELLANEOUS) IMPLANT
LOOP VESSEL MINI RED (MISCELLANEOUS) IMPLANT
NEEDLE 22X1 1/2 (OR ONLY) (NEEDLE) ×3 IMPLANT
NS IRRIG 1000ML POUR BTL (IV SOLUTION) ×3 IMPLANT
PACK ENDO MINOR (CUSTOM PROCEDURE TRAY) ×3 IMPLANT
PAD ARMBOARD 7.5X6 YLW CONV (MISCELLANEOUS) ×6 IMPLANT
PAD ELECT DEFIB RADIOL ZOLL (MISCELLANEOUS) ×3 IMPLANT
PENCIL BUTTON HOLSTER BLD 10FT (ELECTRODE) IMPLANT
PERCLOSE PROGLIDE 6F (VASCULAR PRODUCTS) ×6
SET MICROPUNCTURE 5F STIFF (MISCELLANEOUS) ×3 IMPLANT
SHEATH BRITE TIP 6FR 35CM (SHEATH) ×3 IMPLANT
SHEATH PINNACLE 6F 10CM (SHEATH) ×3 IMPLANT
SHEATH PINNACLE 8F 10CM (SHEATH) ×3 IMPLANT
SLEEVE REPOSITIONING LENGTH 30 (MISCELLANEOUS) ×3 IMPLANT
SPONGE LAP 18X18 X RAY DECT (DISPOSABLE) ×1 IMPLANT
SPONGE LAP 4X18 RFD (DISPOSABLE) ×3 IMPLANT
STOPCOCK MORSE 400PSI 3WAY (MISCELLANEOUS) ×6 IMPLANT
SUT ETHIBOND X763 2 0 SH 1 (SUTURE) IMPLANT
SUT GORETEX CV 4 TH 22 36 (SUTURE) IMPLANT
SUT GORETEX CV4 TH-18 (SUTURE) IMPLANT
SUT MNCRL AB 3-0 PS2 18 (SUTURE) IMPLANT
SUT PROLENE 5 0 C 1 36 (SUTURE) IMPLANT
SUT PROLENE 6 0 C 1 30 (SUTURE) IMPLANT
SUT SILK  1 MH (SUTURE) ×1
SUT SILK 1 MH (SUTURE) ×2 IMPLANT
SUT VIC AB 2-0 CT1 27 (SUTURE)
SUT VIC AB 2-0 CT1 TAPERPNT 27 (SUTURE) IMPLANT
SUT VIC AB 2-0 CTX 36 (SUTURE) IMPLANT
SUT VIC AB 3-0 SH 8-18 (SUTURE) IMPLANT
SYR 50ML LL SCALE MARK (SYRINGE) ×3 IMPLANT
SYR BULB IRRIGATION 50ML (SYRINGE) IMPLANT
SYR CONTROL 10ML LL (SYRINGE) IMPLANT
SYR MEDRAD MARK V 150ML (SYRINGE) ×3 IMPLANT
TOWEL GREEN STERILE (TOWEL DISPOSABLE) ×6 IMPLANT
TRANSDUCER W/STOPCOCK (MISCELLANEOUS) ×6 IMPLANT
TRAY FOLEY SLVR 14FR TEMP STAT (SET/KITS/TRAYS/PACK) IMPLANT
TUBE SUCT INTRACARD DLP 20F (MISCELLANEOUS) IMPLANT
URINAL MALE W/LID DISP 1000CC (MISCELLANEOUS) IMPLANT
VALVE HEART TRANSCATH SZ3 23MM (Valve) ×1 IMPLANT
WIRE .035 3MM-J 145CM (WIRE) ×3 IMPLANT
WIRE HI TORQ VERSACORE J 260CM (WIRE) ×1 IMPLANT

## 2018-11-28 NOTE — Progress Notes (Signed)
PM Round: Pt doing well. No CP or dyspnea. BL groin sites clear. We called her daughter so they could talk. Will FU in am likely DC tomorrow.  Sherren Mocha 11/28/2018 3:54 PM

## 2018-11-28 NOTE — Progress Notes (Signed)
Left femoral arterial and venous sheaths pulled and pressure held for 20 minutes. Patient remained hemodynamically stable throughout.

## 2018-11-28 NOTE — Progress Notes (Signed)
  [place] VALVE TEAM  Patient doing well s/p TAVR. She is hemodynamically stable. Groin sites stable. ECG with sinus and no high grade block. Plan to DC arterial line and transfer  to 4E. Plan for early ambulation after bedrest completed and hopeful discharge over the next 24-48 hours.   Angelena Form PA-C  [facility]  Pager [phone]

## 2018-11-28 NOTE — Progress Notes (Signed)
  Echocardiogram 2D Echocardiogram has been performed.  Bobbye Charleston 11/28/2018, 12:01 PM

## 2018-11-28 NOTE — Transfer of Care (Signed)
Immediate Anesthesia Transfer of Care Note  Patient: Brenda [name]  Procedure(s) Performed: TRANSCATHETER AORTIC VALVE REPLACEMENT, TRANSFEMORAL (N/A Chest) TRANSESOPHAGEAL ECHOCARDIOGRAM (TEE) (N/A )  Patient Location: Cath Lab  Anesthesia Type:MAC  Level of Consciousness: drowsy and patient cooperative  Airway & Oxygen Therapy: Patient Spontanous Breathing and Patient connected to nasal cannula oxygen  Post-op Assessment: Report given to RN and Post -op Vital signs reviewed and stable  Post vital signs: Reviewed and stable  Last Vitals:  Vitals Value Taken Time  BP    Temp    Pulse    Resp    SpO2      Last Pain:  Vitals:   11/28/18 0822  TempSrc: Oral  PainSc:       Patients Stated Pain Goal: 3 (17/91/50 5697)  Complications: No apparent anesthesia complications

## 2018-11-28 NOTE — Progress Notes (Signed)
PT bedrest over. Pt ambulated 10 feet. Sitting in chair. Pt denies any complaints. Vitals stable. Will continue to monitor.  Jerald Kief, RN.

## 2018-11-28 NOTE — Anesthesia Procedure Notes (Signed)
Arterial Line Insertion Start/End5/11/2018 9:30 AM Performed by: Candis Shine, CRNA, CRNA  Patient location: Pre-op. Preanesthetic checklist: patient identified, IV checked, site marked, risks and benefits discussed, surgical consent, monitors and equipment checked, pre-op evaluation, timeout performed and anesthesia consent Lidocaine 1% used for infiltration Right, radial was placed Catheter size: 20 Fr Hand hygiene performed  and maximum sterile barriers used   Attempts: 2 Procedure performed without using ultrasound guided technique. Following insertion, dressing applied and Biopatch. Post procedure assessment: normal and unchanged  Patient tolerated the procedure well with no immediate complications.

## 2018-11-28 NOTE — Progress Notes (Signed)
Pt received from PACU. Telebox 22 applied/ccmd notified. CHG bath given. Pt vitals stable. Both groin sites level 0. Pt denies any pain. Call bell within reach/oriented to room. Will continue to monitor.  Jerald Kief, RN

## 2018-11-28 NOTE — Op Note (Signed)
[facility]   MULTIDISCIPLINARY HEART VALVE TEAM   TAVR OPERATIVE NOTE   Date of Procedure:  11/28/2018  Preoperative Diagnosis: Severe Aortic Stenosis   Postoperative Diagnosis: Same   Procedure:    Transcatheter Aortic Valve Replacement - Percutaneous Transfemoral Approach  Edwards Sapien 3 THV (size 23 mm, model # 9600TFX, serial # [id number])   Co-Surgeons:  Valentina Gu. Roxy Manns, MD and Sherren Mocha, MD  Anesthesiologist:  Laurie Panda, MD  Echocardiographer:  Sanda Klein, MD  Pre-operative Echo Findings:  Severe aortic stenosis  Normal left ventricular systolic function  Post-operative Echo Findings:  Trivial paravalvular leak  Normal left ventricular systolic function  BRIEF CLINICAL NOTE AND INDICATIONS FOR SURGERY  Please see Dr Guy Sandifer full note for indications and background history.  During the course of the patient's preoperative work up they have been evaluated comprehensively by a multidisciplinary team of specialists coordinated through the [facility] in the [facility].  They have been demonstrated to suffer from symptomatic severe aortic stenosis as noted above. The patient has been counseled extensively as to the relative risks and benefits of all options for the treatment of severe aortic stenosis including long term medical therapy, conventional surgery for aortic valve replacement, and transcatheter aortic valve replacement. Based upon review of all of the patient's preoperative diagnostic tests they are felt to be candidate for transcatheter aortic valve replacement using the transfemoral approach as an alternative to high risk conventional surgery.    Following the decision to proceed with transcatheter aortic valve replacement, a discussion has been held regarding what types of management strategies would be attempted intraoperatively in the event of life-threatening complications,  including whether or not the patient would be considered a candidate for the use of cardiopulmonary bypass and/or conversion to open sternotomy for attempted surgical intervention.  The patient has been advised of a variety of complications that might develop peculiar to this approach including but not limited to risks of death, stroke, paravalvular leak, aortic dissection or other major vascular complications, aortic annulus rupture, device embolization, cardiac rupture or perforation, acute myocardial infarction, arrhythmia, heart block or bradycardia requiring permanent pacemaker placement, congestive heart failure, respiratory failure, renal failure, pneumonia, infection, other late complications related to structural valve deterioration or migration, or other complications that might ultimately cause a temporary or permanent loss of functional independence or other long term morbidity.  The patient provides full informed consent for the procedure as described and all questions were answered preoperatively.  DETAILS OF THE OPERATIVE PROCEDURE  PREPARATION:   The patient is brought to the operating room on the above mentioned date and central monitoring was established by the anesthesia team including placement of a central venous catheter and radial arterial line. The patient is placed in the supine position on the operating table.  Intravenous antibiotics are administered. The patient is monitored closely throughout the procedure under conscious sedation.  Baseline transthoracic echocardiogram is performed. The patient's chest, abdomen, both groins, and both lower extremities are prepared and draped in a sterile manner. A time out procedure is performed.  PERIPHERAL ACCESS:   Using ultrasound guidance, femoral arterial and venous access is obtained with placement of 6 Fr sheaths on the left side.  A pigtail diagnostic catheter was passed through the femoral arterial sheath under fluoroscopic guidance  into the aortic root.  A temporary transvenous pacemaker catheter was passed through the femoral venous sheath under fluoroscopic guidance into the right ventricle.  The  pacemaker was tested to ensure stable lead placement and pacemaker capture. Aortic root angiography was performed in order to determine the optimal angiographic angle for valve deployment.  TRANSFEMORAL ACCESS:  A micropuncture technique is used to access the right femoral artery under fluoroscopic and ultrasound guidance.  2 Perclose devices are deployed at 10' and 2' positions to 'PreClose' the femoral artery. An 8 French sheath is placed and then an Amplatz Superstiff wire is advanced through the sheath. This is changed out for a 14 French transfemoral E-Sheath after progressively dilating over the Superstiff wire.  An AL-1 catheter was used to direct a straight-tip exchange length wire across the native aortic valve into the left ventricle. This was exchanged out for a pigtail catheter and position was confirmed in the LV apex. Simultaneous LV and Ao pressures were recorded.  The pigtail catheter was exchanged for an Amplatz Extra-stiff wire in the LV apex.  Echocardiography was utilized to confirm appropriate wire position and no sign of entanglement in the mitral subvalvular apparatus.  BALLOON AORTIC VALVULOPLASTY:  Not performed  TRANSCATHETER HEART VALVE DEPLOYMENT:  An Edwards Sapien 3 transcatheter heart valve (size 23 mm, model #9600TFX, serial # [id number]) was prepared and crimped per manufacturer's guidelines, and the proper orientation of the valve is confirmed on the [employer] delivery system. The valve was advanced through the introducer sheath using normal technique until in an appropriate position in the abdominal aorta beyond the sheath tip. The balloon was then retracted and using the fine-tuning wheel was centered on the valve. The valve was then advanced across the aortic arch using appropriate flexion of the  catheter. The valve was carefully positioned across the aortic valve annulus. The Commander catheter was retracted using normal technique. Once final position of the valve has been confirmed by angiographic assessment, the valve is deployed while temporarily holding ventilation and during rapid ventricular pacing to maintain systolic blood pressure < 50 mmHg and pulse pressure < 10 mmHg. The balloon inflation is held for >3 seconds after reaching full deployment volume. Once the balloon has fully deflated the balloon is retracted into the ascending aorta and valve function is assessed using echocardiography. There is felt to be trace paravalvular leak and no central aortic insufficiency.  The patient's hemodynamic recovery following valve deployment is good.  The deployment balloon and guidewire are both removed. Echo demostrated acceptable post-procedural gradients, stable mitral valve function, and trace aortic insufficiency.    PROCEDURE COMPLETION:  The sheath was removed and femoral artery closure is performed using the 2 previously deployed Perclose devices.  Protamine is administered once femoral arterial repair was complete. The site is clear with no evidence of bleeding or hematoma after the sutures are tightened. The temporary pacemaker, pigtail catheters and femoral sheaths were removed with manual pressure used for hemostasis.   The patient tolerated the procedure well and is transported to the surgical intensive care in stable condition. There were no immediate intraoperative complications. All sponge instrument and needle counts are verified correct at completion of the operation.   The patient received a total of 40 mL of intravenous contrast during the procedure.  Sherren Mocha, MD 11/28/2018 1:27 PM

## 2018-11-28 NOTE — Anesthesia Procedure Notes (Signed)
Procedure Name: MAC Date/Time: 11/28/2018 10:45 AM Performed by: Leonor Liv, CRNA Pre-anesthesia Checklist: Patient identified, Emergency Drugs available, Suction available, Patient being monitored and Timeout performed Oxygen Delivery Method: Simple face mask Placement Confirmation: positive ETCO2
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
## 2018-11-28 NOTE — OR Nursing (Signed)
First call to [facility] at 1145. Spoke to [place].

## 2018-11-28 NOTE — Progress Notes (Signed)
[facility] BRIEF PROGRESS NOTE  Day of Surgery  S/P Procedure(s) (LRB): TRANSCATHETER AORTIC VALVE REPLACEMENT, TRANSFEMORAL (N/A) TRANSESOPHAGEAL ECHOCARDIOGRAM (TEE) (N/A)   Looks great Sitting up eating supper No pain NSR w/ stable BP Both groins look good  Plan: Continue routine care, anticipate likely d/c home tomorrow  Rexene Alberts, MD 11/28/2018 5:54 PM
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
## 2018-11-29 ENCOUNTER — Other Ambulatory Visit: Payer: Self-pay | Admitting: Physician Assistant

## 2018-11-29 ENCOUNTER — Telehealth: Payer: Self-pay | Admitting: Physician Assistant

## 2018-11-29 ENCOUNTER — Other Ambulatory Visit: Payer: Self-pay

## 2018-11-29 ENCOUNTER — Inpatient Hospital Stay (HOSPITAL_COMMUNITY): Payer: Medicare Other

## 2018-11-29 DIAGNOSIS — Z952 Presence of prosthetic heart valve: Secondary | ICD-10-CM

## 2018-11-29 LAB — CBC
HCT: 33.8 % — ABNORMAL LOW (ref 36.0–46.0)
Hemoglobin: 11.3 g/dL — ABNORMAL LOW (ref 12.0–15.0)
MCH: 30.5 pg (ref 26.0–34.0)
MCHC: 33.4 g/dL (ref 30.0–36.0)
MCV: 91.1 fL (ref 80.0–100.0)
Platelets: 125 10*3/uL — ABNORMAL LOW (ref 150–400)
RBC: 3.71 MIL/uL — ABNORMAL LOW (ref 3.87–5.11)
RDW: 12.8 % (ref 11.5–15.5)
WBC: 7.6 10*3/uL (ref 4.0–10.5)
nRBC: 0 % (ref 0.0–0.2)

## 2018-11-29 LAB — BASIC METABOLIC PANEL
Anion gap: 12 (ref 5–15)
BUN: 16 mg/dL (ref 8–23)
CO2: 24 mmol/L (ref 22–32)
Calcium: 8.6 mg/dL — ABNORMAL LOW (ref 8.9–10.3)
Chloride: 103 mmol/L (ref 98–111)
Creatinine, Ser: 1.05 mg/dL — ABNORMAL HIGH (ref 0.44–1.00)
GFR calc Af Amer: 56 mL/min — ABNORMAL LOW (ref >60)
GFR calc non Af Amer: 48 mL/min — ABNORMAL LOW (ref >60)
Glucose, Bld: 104 mg/dL — ABNORMAL HIGH (ref 70–99)
Potassium: 3.9 mmol/L (ref 3.5–5.1)
Sodium: 139 mmol/L (ref 135–145)

## 2018-11-29 LAB — ECHOCARDIOGRAM LIMITED
Height: 63 in
Weight: 2273.6 oz

## 2018-11-29 LAB — MAGNESIUM: Magnesium: 1.8 mg/dL (ref 1.7–2.4)

## 2018-11-29 MED ORDER — ASPIRIN 81 MG PO CHEW: 81.0000 mg | CHEWABLE_TABLET | Freq: Every day | ORAL | Status: DC

## 2018-11-29 MED ORDER — CLOPIDOGREL BISULFATE 75 MG PO TABS: 75.0000 mg | ORAL_TABLET | Freq: Every day | ORAL | 1 refills | Status: DC

## 2018-11-29 NOTE — Telephone Encounter (Signed)
[facility]ENTER   MULTIDISCIPLINARY HEART VALVE TEAM   YOUR CARDIOLOGY TEAM HAS ARRANGED FOR AN E-VISIT FOR YOUR APPOINTMENT - PLEASE REVIEW IMPORTANT INFORMATION BELOW SEVERAL DAYS PRIOR TO YOUR APPOINTMENT  Due to the recent COVID-19 pandemic, we are transitioning in-person office visits to tele-medicine visits in an effort to decrease unnecessary exposure to our patients, their families, and staff. These visits are billed to your insurance just like a normal visit is. We also encourage you to sign up for MyChart if you have not already done so. You will need a smartphone if possible. For patients that do not have this, we can still complete the visit using a regular telephone but do prefer a smartphone to enable video when possible. You may have a family member that lives with you that can help. If possible, we also ask that you have a blood pressure cuff and scale at home to measure your blood pressure, heart rate and weight prior to your scheduled appointment. Patients with clinical needs that need an in-person evaluation and testing will still be able to come to the office if absolutely necessary. If you have any questions, feel free to call our office.    YOUR PROVIDER WILL BE USING THE FOLLOWING PLATFORM TO COMPLETE YOUR VISIT: [email or web address] All you need is a smart phone. You will receive a text message from the provider through the [email or web address] app. You will follow the prompts and it will take you to a virtual visit with your provider. Please check your blood pressure, heart rate and weight prior to your scheduled appointment.  CONSENT FOR TELE-HEALTH VISIT - PLEASE REVIEW  I hereby voluntarily request, consent and authorize [facility] and its employed or contracted physicians, physician assistants, nurse practitioners or other licensed health care professionals (the Practitioner), to provide me with telemedicine health care services (the "Services") as deemed necessary by the treating  Practitioner. I acknowledge and consent to receive the Services by the Practitioner via telemedicine. I understand that the telemedicine visit will involve communicating with the Practitioner through live audiovisual communication technology and the disclosure of certain medical information by electronic transmission. I acknowledge that I have been given the opportunity to request an in-person assessment or other available alternative prior to the telemedicine visit and am voluntarily participating in the telemedicine visit.  I understand that I have the right to withhold or withdraw my consent to the use of telemedicine in the course of my care at any time, without affecting my right to future care or treatment, and that the Practitioner or I may terminate the telemedicine visit at any time. I understand that I have the right to inspect all information obtained and/or recorded in the course of the telemedicine visit and may receive copies of available information for a reasonable fee.  I understand that some of the potential risks of receiving the Services via telemedicine include:  Marland Kitchen Delay or interruption in medical evaluation due to technological equipment failure or disruption; . Information transmitted may not be sufficient (e.g. poor resolution of images) to allow for appropriate medical decision making by the Practitioner; and/or  . In rare instances, security protocols could fail, causing a breach of personal health information.  Furthermore, I acknowledge that it is my responsibility to provide information about my medical history, conditions and care that is complete and accurate to the best of my ability. I acknowledge that Practitioner's advice, recommendations, and/or decision may be based on factors not within their control, such as incomplete  or inaccurate data provided by me or distortions of diagnostic images or specimens that may result from electronic transmissions. I understand that the  practice of medicine is not an exact science and that Practitioner makes no warranties or guarantees regarding treatment outcomes. I acknowledge that I will receive a copy of this consent concurrently upon execution via email to the email address I last provided but may also request a printed copy by calling the office of [place].    I understand that my insurance will be billed for this visit.   I have read or had this consent read to me. . I understand the contents of this consent, which adequately explains the benefits and risks of the Services being provided via telemedicine.  . I have been provided ample opportunity to ask questions regarding this consent and the Services and have had my questions answered to my satisfaction. . I give my informed consent for the services to be provided through the use of telemedicine in my medical care  By participating in this telemedicine visit I agree to the above.

## 2018-11-29 NOTE — Progress Notes (Signed)
  Echocardiogram 2D Echocardiogram has been performed.  Jennette Dubin 11/29/2018, 11:00 AM

## 2018-11-29 NOTE — Progress Notes (Signed)
Pt provided discharge instructions and education. IV's removed and intact. Pt denies any complaints. Pt has all belongings. Volunteers called to tx pt via wheelchair to valet to meet ride. Jerald Kief, RN

## 2018-11-29 NOTE — Progress Notes (Signed)
Cardiac Rehab Advisory Cardiac Rehab Phase I is not seeing pts face to face at this time due to Covid 19 restrictions. Ambulation is occurring through nursing, PT, and mobility teams. We will help facilitate that process as needed. We are calling pts in their rooms and discussing education. We will then deliver education materials to pts RN for delivery to pt.   [phone] Patient given discharge education including restrictions, heart healthy diet, and exercise guidelines. Discussed phase 2 cardiac rehab, and pt is interested in the program at [facility], referral sent.  Sol Passer, MS, ACSM CEP

## 2018-11-29 NOTE — Discharge Instructions (Signed)
ACTIVITY AND EXERCISE °• Daily activity and exercise are an important part of your recovery. People recover at different rates depending on their general health and type of valve procedure. °• Most people recovering from TAVR feel better relatively quickly  °• No lifting, pushing, pulling more than 10 pounds (examples to avoid: groceries, vacuuming, gardening, golfing): °            - For one week with a procedure through the groin. °            - For six weeks for procedures through the chest wall or neck °NOTE: You will typically see one of our providers 7-14 days after your procedure to discuss WHEN TO RESUME the above activities.  °  °  °DRIVING °• Do not drive for until you are seen for follow up and cleared by a provider. Generally, we ask patient to not drive for 1 week after their procedure. °• If you have been told by your doctor in the past that you may not drive, you must talk with him/her before you begin driving again. °  °  °DRESSING °• Groin site: you may leave the clear dressing over the site for up to one week or until it falls off. °  °  °HYGIENE °• If you had a femoral (leg) procedure, you may take a shower when you return home. After the shower, pat the site dry. Do NOT use powder, oils or lotions in your groin area until the site has completely healed. °• If you had a chest procedure, you may shower when you return home unless specifically instructed not to by your discharging practitioner. °            - DO NOT scrub incision; pat dry with a towel °            - DO NOT apply any lotions, oils, powders to the incision °            - No tub baths / swimming for at least 2 weeks. °• If you notice any fevers, chills, increased pain, swelling, bleeding or pus, please contact your doctor. °  °ADDITIONAL INFORMATION °• If you are going to have an upcoming dental procedure, please contact our office as you will require antibiotics ahead of time to prevent infection on your heart valve.  ° ° °If you  have any questions or concerns you can call the structural heart phone during normal business hours 8am-4pm. If you have an urgent need after hours or weekends please call [phone] to talk to the on call provider for general cardiology. If you have an emergency that requires immediate attention, please call 911.  ° ° °After TAVR Checklist ° °Check  Test Description  ° Follow up appointment in 1-2 weeks  You will see our structural heart physician assistant, Katie Kaydyn Sayas. Your incision sites will be checked and you will be cleared to drive and resume all normal activities if you are doing well.    ° 1 month echo and follow up  You will have an echo to check on your new heart valve and be seen back in the office by Katie Ameerah Huffstetler. Many times the echo is not read by your appointment time, but Katie will call you later that day or the following day to report your results.  ° Follow up with your primary cardiologist You will need to be seen by your primary cardiologist in the following 3-6 months after your 1   month appointment in the valve clinic. Often times your Plavix or Aspirin will be discontinued during this time, but this is decided on a case by case basis.   ° 1 year echo and follow up You will have another echo to check on your heart valve after 1 year and be seen back in the office by Katie Tameisha Covell. This your last structural heart visit.  ° Bacterial endocarditis prophylaxis  You will have to take antibiotics for the rest of your life before all dental procedures (even teeth cleanings) to protect your heart valve. Antibiotics are also required before some surgeries. Please check with your cardiologist before scheduling any surgeries. Also, please make sure to tell us if you have a penicillin allergy as you will require an alternative antibiotic.   ° ° ° ° °[facility] AND V[facility]ENTER   °MULTIDISCIPLINARY HEART VALVE TEAM ° ° °YOUR CARDIOLOGY TEAM HAS ARRANGED FOR AN E-VISIT FOR YOUR APPOINTMENT - PLEASE  REVIEW IMPORTANT INFORMATION BELOW SEVERAL DAYS PRIOR TO YOUR APPOINTMENT ° °Due to the recent COVID-19 pandemic, we are transitioning in-person office visits to tele-medicine visits in an effort to decrease unnecessary exposure to our patients, their families, and staff. These visits are billed to your insurance just like a normal visit is. We also encourage you to sign up for MyChart if you have not already done so. You will need a smartphone if possible. For patients that do not have this, we can still complete the visit using a regular telephone but do prefer a smartphone to enable video when possible. You may have a family member that lives with you that can help. If possible, we also ask that you have a blood pressure cuff and scale at home to measure your blood pressure, heart rate and weight prior to your scheduled appointment. Patients with clinical needs that need an in-person evaluation and testing will still be able to come to the office if absolutely necessary. If you have any questions, feel free to call our office. ° ° ° °YOUR PROVIDER WILL BE USING THE FOLLOWING PLATFORM TO COMPLETE YOUR VISIT: [email or web address] °All you need is a smart phone. You will receive a text message from the provider through the [email or web address] app. You will follow the prompts and it will take you to a virtual visit with your provider. Please check your blood pressure, heart rate and weight prior to your scheduled appointment. ° °CONSENT FOR TELE-HEALTH VISIT - PLEASE REVIEW ° °I hereby voluntarily request, consent and authorize [facility] and its employed or contracted physicians, physician assistants, nurse practitioners or other licensed health care professionals (the Practitioner), to provide me with telemedicine health care services (the “Services") as deemed necessary by the treating Practitioner. I acknowledge and consent to receive the Services by the Practitioner via telemedicine. I understand that the telemedicine visit will  involve communicating with the Practitioner through live audiovisual communication technology and the disclosure of certain medical information by electronic transmission. I acknowledge that I have been given the opportunity to request an in-person assessment or other available alternative prior to the telemedicine visit and am voluntarily participating in the telemedicine visit. ° °I understand that I have the right to withhold or withdraw my consent to the use of telemedicine in the course of my care at any time, without affecting my right to future care or treatment, and that the Practitioner or I may terminate the telemedicine visit at any time. I understand that I have the right to inspect all information obtained   and/or recorded in the course of the telemedicine visit and may receive copies of available information for a reasonable fee.  I understand that some of the potential risks of receiving the Services via telemedicine include:  °• Delay or interruption in medical evaluation due to technological equipment failure or disruption; °• Information transmitted may not be sufficient (e.g. poor resolution of images) to allow for appropriate medical decision making by the Practitioner; and/or  °• In rare instances, security protocols could fail, causing a breach of personal health information. ° °Furthermore, I acknowledge that it is my responsibility to provide information about my medical history, conditions and care that is complete and accurate to the best of my ability. I acknowledge that Practitioner's advice, recommendations, and/or decision may be based on factors not within their control, such as incomplete or inaccurate data provided by me or distortions of diagnostic images or specimens that may result from electronic transmissions. I understand that the practice of medicine is not an exact science and that Practitioner makes no warranties or guarantees regarding treatment outcomes. I acknowledge that  I will receive a copy of this consent concurrently upon execution via email to the email address I last provided but may also request a printed copy by calling the office of [facility].   ° °I understand that my insurance will be billed for this visit.  ° °I have read or had this consent read to me. °• I understand the contents of this consent, which adequately explains the benefits and risks of the Services being provided via telemedicine.  °• I have been provided ample opportunity to ask questions regarding this consent and the Services and have had my questions answered to my satisfaction. °• I give my informed consent for the services to be provided through the use of telemedicine in my medical care ° °By participating in this telemedicine visit I agree to the above. °
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
## 2018-11-30 ENCOUNTER — Encounter (HOSPITAL_COMMUNITY): Payer: Self-pay | Admitting: Cardiovascular Disease

## 2018-11-30 ENCOUNTER — Telehealth: Payer: Self-pay | Admitting: Physician Assistant

## 2018-11-30 NOTE — Telephone Encounter (Signed)
  [place] VALVE TEAM   Patient contacted regarding discharge from [facility] on 11/29/18  Patient understands to follow up with provider Nell Range on 5/12 over the telephone for a virtual visit Patient understands discharge instructions? yes Patient understands medications and regiment? yes Patient understands to bring all medications to this visit? yes  Angelena Form PA-C  [facility]

## 2018-11-30 NOTE — Anesthesia Postprocedure Evaluation (Signed)
Anesthesia Post Note  Patient: Brenda [name]  Procedure(s) Performed: TRANSCATHETER AORTIC VALVE REPLACEMENT, TRANSFEMORAL (N/A Chest) TRANSESOPHAGEAL ECHOCARDIOGRAM (TEE) (N/A )     Patient location during evaluation: ICU Anesthesia Type: MAC Level of consciousness: awake and alert Pain management: pain level controlled Vital Signs Assessment: post-procedure vital signs reviewed and stable Respiratory status: spontaneous breathing, nonlabored ventilation, respiratory function stable and patient connected to nasal cannula oxygen Cardiovascular status: stable Postop Assessment: no apparent nausea or vomiting Anesthetic complications: no    Last Vitals:  Vitals:   11/29/18 0746 11/29/18 1134  BP: (!) 123/59 137/64  Pulse: 81 85  Resp: 17 (!) 25  Temp: 37.6 C 37.8 C  SpO2: 90% 93%    Last Pain:  Vitals:   11/29/18 1134  TempSrc: Oral  PainSc:                  Hiyab Nhem

## 2018-12-01 MED FILL — Magnesium Sulfate Inj 50%: INTRAMUSCULAR | Qty: 10 | Status: AC

## 2018-12-01 MED FILL — Heparin Sodium (Porcine) Inj 1000 Unit/ML: INTRAMUSCULAR | Qty: 30 | Status: AC

## 2018-12-01 MED FILL — Potassium Chloride Inj 2 mEq/ML: INTRAVENOUS | Qty: 40 | Status: AC

## 2018-12-04 ENCOUNTER — Telehealth: Payer: Self-pay | Admitting: Physician Assistant
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
## 2018-12-04 NOTE — Telephone Encounter (Signed)
New Message   Please call patient's daughter to help setup for virtual appointment for 5/12.

## 2018-12-04 NOTE — Telephone Encounter (Signed)
Called and spoke to patient and patient's daughter regarding her VIDEO Visit via [email or web address] tomorrow with Nell Range, PA. They are going to use daughter's smart phone for the visit.      Virtual Visit Pre-Appointment Phone Call TELEPHONE CALL NOTE  Brenda [name] has been deemed a candidate for a follow-up tele-health visit to limit community exposure during the Covid-19 pandemic. I spoke with the patient via phone to ensure availability of phone/video source, confirm preferred email & phone number, and discuss instructions and expectations.  I reminded Brenda [name] to be prepared with any vital sign and/or heart rhythm information that could potentially be obtained via home monitoring, at the time of her visit. I reminded Brenda [name] to expect a phone call prior to her visit.   Patient agrees to consent below.  Cleon Gustin, RN 12/04/2018 2:23 PM   FULL LENGTH CONSENT FOR TELE-HEALTH VISIT   I hereby voluntarily request, consent and authorize [facility] and its employed or contracted physicians, physician assistants, nurse practitioners or other licensed health care professionals (the Practitioner), to provide me with telemedicine health care services (the "Services") as deemed necessary by the treating Practitioner. I acknowledge and consent to receive the Services by the Practitioner via telemedicine. I understand that the telemedicine visit will involve communicating with the Practitioner through live audiovisual communication technology and the disclosure of certain medical information by electronic transmission. I acknowledge that I have been given the opportunity to request an in-person assessment or other available alternative prior to the telemedicine visit and am voluntarily participating in the telemedicine visit.  I understand that I have the right to withhold or withdraw my consent to the use of telemedicine in the course of my care at any time, without affecting my  right to future care or treatment, and that the Practitioner or I may terminate the telemedicine visit at any time. I understand that I have the right to inspect all information obtained and/or recorded in the course of the telemedicine visit and may receive copies of available information for a reasonable fee.  I understand that some of the potential risks of receiving the Services via telemedicine include:  Marland Kitchen Delay or interruption in medical evaluation due to technological equipment failure or disruption; . Information transmitted may not be sufficient (e.g. poor resolution of images) to allow for appropriate medical decision making by the Practitioner; and/or  . In rare instances, security protocols could fail, causing a breach of personal health information.  Furthermore, I acknowledge that it is my responsibility to provide information about my medical history, conditions and care that is complete and accurate to the best of my ability. I acknowledge that Practitioner's advice, recommendations, and/or decision may be based on factors not within their control, such as incomplete or inaccurate data provided by me or distortions of diagnostic images or specimens that may result from electronic transmissions. I understand that the practice of medicine is not an exact science and that Practitioner makes no warranties or guarantees regarding treatment outcomes. I acknowledge that I will receive a copy of this consent concurrently upon execution via email to the email address I last provided but may also request a printed copy by calling the office of [place].    I understand that my insurance will be billed for this visit.   I have read or had this consent read to me. . I understand the contents of this consent, which adequately explains the benefits and risks of the  Services being provided via telemedicine.  . I have been provided ample opportunity to ask questions regarding this consent and the  Services and have had my questions answered to my satisfaction. . I give my informed consent for the services to be provided through the use of telemedicine in my medical care  By participating in this telemedicine visit I agree to the above.

## 2018-12-05 ENCOUNTER — Other Ambulatory Visit: Payer: Self-pay | Admitting: Physician Assistant

## 2018-12-05 ENCOUNTER — Other Ambulatory Visit: Payer: Self-pay

## 2018-12-05 ENCOUNTER — Telehealth (INDEPENDENT_AMBULATORY_CARE_PROVIDER_SITE_OTHER): Payer: Medicare Other | Admitting: Physician Assistant

## 2018-12-05 ENCOUNTER — Encounter: Payer: Self-pay | Admitting: Physician Assistant

## 2018-12-05 VITALS — BP 148/80 | HR 74 | Wt 140.0 lb

## 2018-12-05 DIAGNOSIS — E039 Hypothyroidism, unspecified: Secondary | ICD-10-CM

## 2018-12-05 DIAGNOSIS — Z952 Presence of prosthetic heart valve: Secondary | ICD-10-CM | POA: Diagnosis not present

## 2018-12-05 DIAGNOSIS — E782 Mixed hyperlipidemia: Secondary | ICD-10-CM

## 2018-12-05 DIAGNOSIS — Z7189 Other specified counseling: Secondary | ICD-10-CM

## 2018-12-05 DIAGNOSIS — I10 Essential (primary) hypertension: Secondary | ICD-10-CM

## 2018-12-05 MED ORDER — AMLODIPINE BESYLATE 5 MG PO TABS: 5.0000 mg | ORAL_TABLET | Freq: Every day | ORAL | 0 refills | Status: DC

## 2018-12-05 MED ORDER — AMOXICILLIN 500 MG PO TABS: 2000.0000 mg | ORAL_TABLET | ORAL | 12 refills | Status: DC

## 2018-12-05 NOTE — Progress Notes (Signed)
thx Katie 

## 2018-12-05 NOTE — Patient Instructions (Signed)
Hello Mrs. Tobey,   It was so nice to talk to you on the audio/video chat today. I am so glad you are doing so well. I just wanted to send you a recap of our discussion.   Please remember you need antibiotics prior to any dental work including cleanings (amoxicillin has been called into your pharmacy). Please continue on all your same medications. I have added amlodipine 5mg  daily. This has been called into your pharmacy. Please continue to log your BPs and let me know if it is consistently running >130/80 and we can double the dose to 10mg  daily.   I will see you next month for an echo and follow up virtual visit.   All appointment details are listed in upcoming appointments in MyChart or attached to this letter in your "after visit summary."  Please call us with any questions or concerns you may have and please stay safe during these uncertain times.  Nell Range [phone]

## 2018-12-06 ENCOUNTER — Telehealth: Payer: Medicare Other | Admitting: Physician Assistant

## 2018-12-07 ENCOUNTER — Telehealth (HOSPITAL_COMMUNITY): Payer: Self-pay | Admitting: *Deleted

## 2018-12-07 NOTE — Telephone Encounter (Signed)
Called and left message for pt regarding general well being and cardiac rehab.  Requested a call back.  Contact information provided. Cherre Huger, BSN Cardiac and Training and development officer

## 2018-12-26 ENCOUNTER — Telehealth (HOSPITAL_COMMUNITY): Payer: Self-pay | Admitting: Cardiology

## 2018-12-26 NOTE — Telephone Encounter (Signed)

## 2018-12-27 ENCOUNTER — Ambulatory Visit (HOSPITAL_COMMUNITY): Payer: Medicare Other | Attending: Physician Assistant

## 2018-12-27 ENCOUNTER — Other Ambulatory Visit: Payer: Self-pay

## 2018-12-27 DIAGNOSIS — Z952 Presence of prosthetic heart valve: Secondary | ICD-10-CM
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
## 2018-12-28 ENCOUNTER — Telehealth: Payer: Medicare Other | Admitting: Physician Assistant

## 2018-12-28 ENCOUNTER — Telehealth (INDEPENDENT_AMBULATORY_CARE_PROVIDER_SITE_OTHER): Payer: Medicare Other | Admitting: Physician Assistant

## 2018-12-28 VITALS — BP 133/76 | HR 81 | Wt 140.0 lb

## 2018-12-28 DIAGNOSIS — E039 Hypothyroidism, unspecified: Secondary | ICD-10-CM

## 2018-12-28 DIAGNOSIS — Z7189 Other specified counseling: Secondary | ICD-10-CM

## 2018-12-28 DIAGNOSIS — Z952 Presence of prosthetic heart valve: Secondary | ICD-10-CM | POA: Diagnosis not present

## 2018-12-28 DIAGNOSIS — E782 Mixed hyperlipidemia: Secondary | ICD-10-CM

## 2018-12-28 DIAGNOSIS — I10 Essential (primary) hypertension: Secondary | ICD-10-CM

## 2018-12-28 MED ORDER — AMLODIPINE BESYLATE 5 MG PO TABS: 5.0000 mg | ORAL_TABLET | Freq: Every day | ORAL | 3 refills | Status: DC

## 2018-12-28 NOTE — Patient Instructions (Signed)
Medication Instructions:  1) STOP PLAVIX when your pills run out in November 2020.  Follow-Up: You have an appointment scheduled with Dr. [name] on April 16, 2019 at 11:00AM.  You are scheduled for your 1 year TAVR echo and subsequent office visit with Nell Range on November 21, 2019. Please arrive at the office at 1:30PM for those appointments.

## 2018-12-31 NOTE — Progress Notes (Signed)
Awesome thanks!

## 2019-01-12 ENCOUNTER — Encounter: Payer: Self-pay | Admitting: Thoracic Surgery (Cardiothoracic Vascular Surgery)

## 2019-01-19 ENCOUNTER — Telehealth (HOSPITAL_COMMUNITY): Payer: Self-pay

## 2019-01-19 NOTE — Telephone Encounter (Signed)
No response from pt, closed referral. °

## 2019-01-25 DIAGNOSIS — E890 Postprocedural hypothyroidism: Secondary | ICD-10-CM | POA: Diagnosis not present

## 2019-01-25 DIAGNOSIS — I10 Essential (primary) hypertension: Secondary | ICD-10-CM | POA: Diagnosis not present

## 2019-01-25 DIAGNOSIS — N189 Chronic kidney disease, unspecified: Secondary | ICD-10-CM | POA: Diagnosis not present

## 2019-01-25 DIAGNOSIS — E7800 Pure hypercholesterolemia, unspecified: Secondary | ICD-10-CM | POA: Diagnosis not present

## 2019-01-25 DIAGNOSIS — F419 Anxiety disorder, unspecified: Secondary | ICD-10-CM | POA: Diagnosis not present

## 2019-01-25 DIAGNOSIS — Z953 Presence of xenogenic heart valve: Secondary | ICD-10-CM | POA: Diagnosis not present

## 2019-02-05 ENCOUNTER — Telehealth: Payer: Self-pay | Admitting: Cardiovascular Disease

## 2019-02-05 NOTE — Telephone Encounter (Signed)
New message:    Patient daughter calling concerning her mother need to be seen. Patient has not slept in 2 nights.  Patient headache on the top of her head and patient is dizzy and BP has gone up last night 152/88 it runs 127/70. Please call patient daughter she also fell like some one she see her today or soon.

## 2019-02-05 NOTE — Telephone Encounter (Signed)
I spoke to the patient's daughter Lynelle Smoke) who expressed concerns because the patient's BP has been increasing lately with symptoms of headaches and dizziness.  She would like to be evaluated in the office so I arranged an OV with Vin 7/14 @ 11:30.      COVID-19 Pre-Screening Questions:  . In the past 7 to 10 days have you had a cough,  shortness of breath, headache, congestion, fever (100 or greater) body aches, chills, sore throat, or sudden loss of taste or sense of smell?  no . Have you been around anyone with known Covid 19  no . Have you been around anyone who is awaiting Covid 19 test results in the past 7 to 10 days?  no . Have you been around anyone who has been exposed to Covid 19, or has mentioned symptoms of Covid 19 within the past 7 to 10 days?  no  If you have any concerns/questions about symptoms patients report during screening (either on the phone or at threshold). Contact the provider seeing the patient or DOD for further guidance.  If neither are available contact a member of the leadership team.

## 2019-02-06 ENCOUNTER — Other Ambulatory Visit: Payer: Self-pay

## 2019-02-06 ENCOUNTER — Ambulatory Visit (INDEPENDENT_AMBULATORY_CARE_PROVIDER_SITE_OTHER): Payer: Medicare Other | Admitting: Physician Assistant

## 2019-02-06 ENCOUNTER — Encounter: Payer: Self-pay | Admitting: Physician Assistant

## 2019-02-06 VITALS — BP 132/74 | HR 84 | Ht 63.5 in | Wt 140.1 lb

## 2019-02-06 DIAGNOSIS — Z952 Presence of prosthetic heart valve: Secondary | ICD-10-CM

## 2019-02-06 DIAGNOSIS — M5412 Radiculopathy, cervical region: Secondary | ICD-10-CM | POA: Diagnosis not present

## 2019-02-06 DIAGNOSIS — I2510 Atherosclerotic heart disease of native coronary artery without angina pectoris: Secondary | ICD-10-CM

## 2019-02-06 DIAGNOSIS — I10 Essential (primary) hypertension: Secondary | ICD-10-CM | POA: Diagnosis not present

## 2019-02-06 DIAGNOSIS — R42 Dizziness and giddiness: Secondary | ICD-10-CM | POA: Diagnosis not present
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
## 2019-02-06 NOTE — Patient Instructions (Signed)
Medication Instructions:  Your physician recommends that you continue on your current medications as directed. Please refer to the Current Medication list given to you today.  If you need a refill on your cardiac medications before your next appointment, please call your pharmacy.   Lab work: None ordered If you have labs (blood work) drawn today and your tests are completely normal, you will receive your results only by: Marland Kitchen MyChart Message (if you have MyChart) OR . A paper copy in the mail If you have any lab test that is abnormal or we need to change your treatment, we will call you to review the results.  Testing/Procedures: None ordered  Follow-Up: At [facility], you and your health needs are our priority.  As part of our continuing mission to provide you with exceptional heart care, we have created designated Provider Care Teams.  These Care Teams include your primary Cardiologist (physician) and Advanced Practice Providers (APPs -  Physician Assistants and Nurse Practitioners) who all work together to provide you with the care you need, when you need it. . Follow-up as scheduled  Any Other Special Instructions Will Be Listed Below (If Applicable).

## 2019-02-09 DIAGNOSIS — E782 Mixed hyperlipidemia: Secondary | ICD-10-CM | POA: Diagnosis not present

## 2019-02-09 DIAGNOSIS — I10 Essential (primary) hypertension: Secondary | ICD-10-CM | POA: Diagnosis not present

## 2019-02-18 NOTE — Progress Notes (Signed)
[name] [address] [place], [place] [address] Phone: [phone] Subjective:      CC: Neck pain follow-up [facility]:[id number]  Brenda [name] is a 85 y.o. female coming in with complaint of neck pain.  Known to have arthritic changes in multiple joints.  Patient also has had a history of ACDF.  Has been doing the home exercises occasionally in the topical anti-inflammatories which both she thinks does help.  Patient is still the primary caregiver of her husband who does have ALS.  Patient states that the anxiety from that seems to make this worse from time to time.  Patient is accompanied with daughter who states that she still complains of her neck pain at least on a daily basis.  Patient states that the nighttime pain seems to be the worsening portion of it.    Past Medical History:  Diagnosis Date   Anxiety    Asthma    Barrett's esophagus    Cancer ([facility])    DJD (degenerative joint disease)    GERD (gastroesophageal reflux disease)    Goiter    Hemorrhoids    History of colonoscopy    History of shingles    face/eye   History of subdural hematoma    History of traumatic subdural hematoma    Hx of colonic polyps    Hyperlipidemia    Hypertension    IBS (irritable bowel syndrome)    Osteoarthritis    S/P TAVR (transcatheter aortic valve replacement)    23 mm Edwards Sapien 3 transcatheter heart valve placed via percutaneous right transfemoral approach    Sarcoidosis    [facility] ([place])    Past Surgical History:  Procedure Laterality Date   BREAST LUMPECTOMY  1974   left   CATARACT EXTRACTION W/ INTRAOCULAR LENS  IMPLANT, BILATERAL     CRANIOTOMY Right 01/04/2014   Procedure: CRANIOTOMY HEMATOMA EVACUATION SUBDURAL;  Surgeon: [name], MD;  Location: [facility];  Service: Neurosurgery;  Laterality: Right;   CRANIOTOMY N/A 01/08/2014   Procedure: Redo CRANIOTOMY HEMATOMA EVACUATION SUBDURAL RIGHT;  Surgeon: [name], MD;  Location: [place] NEURO ORS;  Service: Neurosurgery;  Laterality: N/A;  Redo CRANIOTOMY HEMATOMA EVACUATION SUBDURAL RIGHT   CRANIOTOMY Right 01/10/2014   Procedure: Redo CRANIOTOMY HEMATOMA EVACUATION SUBDURAL;  Surgeon: [name], MD;  Location: [place] NEURO ORS;  Service: Neurosurgery;  Laterality: Right;   DILATION AND CURETTAGE OF UTERUS     EYE SURGERY     NASAL SINUS SURGERY     NECK SURGERY     x 2   RIGHT/LEFT HEART CATH AND CORONARY ANGIOGRAPHY N/A 09/27/2018   Procedure: RIGHT/LEFT HEART CATH AND CORONARY ANGIOGRAPHY;  Surgeon: Burnell Blanks, MD;  Location: [place] CV LAB;  Service: Cardiovascular;  Laterality: N/A;   TEE WITHOUT CARDIOVERSION N/A 11/28/2018   Procedure: TRANSESOPHAGEAL ECHOCARDIOGRAM (TEE);  Surgeon: Sherren Mocha, MD;  Location: [place];  Service: Open Heart Surgery;  Laterality: N/A;   TONSILLECTOMY AND ADENOIDECTOMY  1976   TOTAL THYROIDECTOMY  06-05-08   TRANSCATHETER AORTIC VALVE REPLACEMENT, TRANSFEMORAL N/A 11/28/2018   Procedure: TRANSCATHETER AORTIC VALVE REPLACEMENT, TRANSFEMORAL;  Surgeon: Sherren Mocha, MD;  Location: [place];  Service: Open Heart Surgery;  Laterality: N/A;   Social History   Socioeconomic History   Marital status: Married    Spouse name: Not on file   Number of children: 4   Years of education: Not on file   Highest education  level: Not on file  Occupational History   Occupation: Retired    Fish farm manager: RETIRED    Comment: worked for [employer] resource strain: Not on file   Food insecurity    Worry: Not on file    Inability: Not on Lexicographer needs    Medical: Not on file    Non-medical: Not on file  Tobacco Use   Smoking status: Never Smoker   Smokeless tobacco: Never Used  Substance and Sexual Activity   Alcohol use: Yes    Comment: Occ glass of wine with dinner   Drug use: No   Sexual activity: Not on file  Lifestyle    Physical activity    Days per week: Not on file    Minutes per session: Not on file   Stress: Not on file  Relationships   Social connections    Talks on phone: Not on file    Gets together: Not on file    Attends religious service: Not on file    Active member of club or organization: Not on file    Attends meetings of clubs or organizations: Not on file    Relationship status: Not on file  Other Topics Concern   Not on file  Social History Narrative   Not on file   No Active Allergies Family History  Problem Relation Age of Onset   Hypertension Father    Hyperlipidemia Father    Rheum arthritis Father    Heart failure Father    Heart disease Father    Hypertension Mother    Hyperlipidemia Mother    Kidney cancer Mother    Lymphoma Mother    Hypertension Brother    Hypertension Sister    Asthma Sister    Thyroid cancer Sister    Colon cancer Sister     Current Outpatient Medications (Endocrine & Metabolic):    levothyroxine (SYNTHROID, LEVOTHROID) 75 MCG tablet, Take 75 mcg by mouth every other day. Alternate 75 mcg one day and 88 mcg the next   levothyroxine (SYNTHROID, LEVOTHROID) 88 MCG tablet, Take 88 mcg by mouth every other day. Alternate 75 mcg one day and 88 mcg the next  Current Outpatient Medications (Cardiovascular):    amLODipine (NORVASC) 5 MG tablet, Take 1 tablet (5 mg total) by mouth daily.   rosuvastatin (CRESTOR) 40 MG tablet, TAKE 1 TABLET (40 MG TOTAL) BY MOUTH DAILY.   spironolactone (ALDACTONE) 25 MG tablet, Take 25 mg by mouth daily.  Current Outpatient Medications (Respiratory):    Fluticasone Propionate (FLONASE ALLERGY RELIEF NA), Place into the nose daily.  Current Outpatient Medications (Analgesics):    acetaminophen (TYLENOL) 650 MG CR tablet, Take 650 mg by mouth daily.    aspirin 81 MG chewable tablet, Chew 1 tablet (81 mg total) by mouth daily.  Current Outpatient Medications (Hematological):     clopidogrel (PLAVIX) 75 MG tablet, Take 1 tablet (75 mg total) by mouth daily with breakfast.   vitamin B-12 (CYANOCOBALAMIN) 1000 MCG tablet, Take 1,000 mcg by mouth at bedtime.  Current Outpatient Medications (Other):    ALPRAZolam (XANAX) 0.5 MG tablet, Take 0.5 mg by mouth See admin instructions. Take 0.5 mg in the morning and 1 mg at night   amoxicillin (AMOXIL) 500 MG tablet, Take 4 tablets (2,000 mg total) by mouth as directed. 1 hour prior to dental work including cleanings   Biotin 5000 MCG TABS, Take 5,000 mcg by mouth at bedtime.  Cholecalciferol (VITAMIN D) 125 MCG (5000 UT) CAPS, Take 5,000 Units by mouth at bedtime.    cycloSPORINE (RESTASIS) 0.05 % ophthalmic emulsion, Place 1 drop into both eyes every 12 (twelve) hours.    diclofenac sodium (VOLTAREN) 1 % GEL, Apply 2 g topically daily. To affected joint.   diphenhydramine-acetaminophen (TYLENOL PM) 25-500 MG TABS tablet, Take 1 tablet by mouth at bedtime.   GLUCOSAMINE-CHONDROITIN PO, Take 1 capsule by mouth 3 (three) times daily.   Liniments (SALONPAS PAIN RELIEF PATCH EX), Apply 1 patch topically daily.   Menthol, Topical Analgesic, (BLUE-EMU MAXIMUM STRENGTH EX), Apply 1 application topically daily as needed (knee pain).   Multiple Vitamins-Minerals (MULTIVITAMIN ADULT) CHEW, Chew 2 each by mouth daily.   senna-docusate (SENNA S) 8.6-50 MG tablet, Take 3 tablets by mouth at bedtime.     Past medical history, social, surgical and family history all reviewed in electronic medical record.  No pertanent information unless stated regarding to the chief complaint.   Review of Systems:  No headache, visual changes, nausea, vomiting, diarrhea, constipation, dizziness, abdominal pain, skin rash, fevers, chills, night sweats, weight loss, swollen lymph nodes, body aches, joint swelling, chest pain, shortness of breath, mood changes.  Positive muscle aches  Objective  Blood pressure (!) 150/74, pulse 85, resp. rate  15, height 5' 3.5" (1.613 m), weight 143 lb (64.9 kg), SpO2 98 %.     General: No apparent distress alert and oriented x3 mood and affect normal, dressed appropriately.  Patient does appear to be anxious at baseline HEENT: Pupils equal, extraocular movements intact  Respiratory: Patient's speak in full sentences and does not appear short of breath  Cardiovascular: Trace lower extremity edema, mild tender, no erythema  Skin: Warm dry intact with no signs of infection or rash on extremities or on axial skeleton.  Abdomen: Soft nontender  Neuro: Cranial nerves II through XII are intact, neurovascularly intact in all extremities with 2+ DTRs and 2+ pulses.  Lymph: No lymphadenopathy of posterior or anterior cervical chain or axillae bilaterally.  Gait n antalgic MSK:  tender with limited range of motion and stability and strength and tone of shoulders, elbows, wrist, hip, knee and ankles bilaterally.  Arthritic changes of multiple joints Neck: Inspection loss of lordosis. No palpable stepoffs. Negative Spurling's maneuver. Limited range of motion in all planes by least 10 degrees.  Patient has minimal extension. Grip strength and sensation normal in bilateral hands Strength good C4 to T1 distribution No sensory change to C4 to T1 Negative Hoffman sign bilaterally Reflexes normal Severe tenderness to palpation in the paraspinal musculature on the left side of the neck especially C4-C7   Impression and Recommendations:     This case required medical decision making of moderate complexity. The above documentation has been reviewed and is accurate and complete Lyndal Pulley, DO       Note: This dictation was prepared with Dragon dictation along with smaller phrase technology. Any transcriptional errors that result from this process are unintentional.

## 2019-02-19 ENCOUNTER — Encounter: Payer: Self-pay | Admitting: Family Medicine

## 2019-02-19 ENCOUNTER — Ambulatory Visit (INDEPENDENT_AMBULATORY_CARE_PROVIDER_SITE_OTHER): Payer: Medicare Other | Admitting: Family Medicine

## 2019-02-19 ENCOUNTER — Other Ambulatory Visit: Payer: Self-pay

## 2019-02-19 VITALS — BP 150/74 | HR 85 | Resp 15 | Ht 63.5 in | Wt 143.0 lb

## 2019-02-19 DIAGNOSIS — M47812 Spondylosis without myelopathy or radiculopathy, cervical region: Secondary | ICD-10-CM | POA: Diagnosis not present

## 2019-02-19 DIAGNOSIS — M542 Cervicalgia: Secondary | ICD-10-CM | POA: Diagnosis not present

## 2019-02-19 DIAGNOSIS — I2510 Atherosclerotic heart disease of native coronary artery without angina pectoris: Secondary | ICD-10-CM | POA: Diagnosis not present

## 2019-02-19 NOTE — Patient Instructions (Addendum)
Good to see you  PT will be calling you  Gabapentin 100-200mg  at night Keep doing the exercises  Continue the pennsaid and then switch to the voltaren over the counter  See me again in 6-8 weeks

## 2019-02-19 NOTE — Assessment & Plan Note (Signed)
Severe overall with history of an ACDF.  Patient does have loss of range of motion in all planes.  Patient does have a very large bone spur on the left side that I think contributes to most of the pain.  Started a very low dose of gabapentin to see if this will help with some of the nighttime pain.  Discussed sleeping position, start formal physical therapy for other modalities such as TENS unit, dry needling and massage that could be beneficial.  Patient will follow-up with me again in 6 to 8 weeks.  Depending on the left response may need to consider either trigger points for future consideration or discussed epidurals.  Patient is on a blood thinner now.

## 2019-02-20 ENCOUNTER — Other Ambulatory Visit: Payer: Self-pay | Admitting: *Deleted

## 2019-02-20 MED ORDER — GABAPENTIN 100 MG PO CAPS: ORAL_CAPSULE | ORAL | 1 refills | Status: DC

## 2019-03-05 ENCOUNTER — Ambulatory Visit: Payer: Medicare Other | Attending: Family Medicine | Admitting: Physical Therapy

## 2019-03-05 ENCOUNTER — Other Ambulatory Visit: Payer: Self-pay

## 2019-03-05 ENCOUNTER — Encounter: Payer: Self-pay | Admitting: Physical Therapy

## 2019-03-05 DIAGNOSIS — M542 Cervicalgia: Secondary | ICD-10-CM | POA: Diagnosis not present

## 2019-03-05 DIAGNOSIS — R2689 Other abnormalities of gait and mobility: Secondary | ICD-10-CM | POA: Insufficient documentation

## 2019-03-05 NOTE — Therapy (Signed)
[place], [place], [address] Phone: [phone]   Fax:  [phone]  Physical Therapy Evaluation  Patient Details  Name: Brenda [name] MRN: [id number] Date of Birth: 1931/06/24 Referring Provider (PT): Hulan Saas, DO   Encounter Date: 03/05/2019  PT End of Session - 03/05/19 1301    Visit Number  1    Number of Visits  12    Date for PT Re-Evaluation  04/16/19    Authorization Type  Medicare, Kx at visit 15 and progress note by visit 10    PT Start Time  1150    PT Stop Time  1233    PT Time Calculation (min)  43 min    Activity Tolerance  Patient tolerated treatment well    Behavior During Therapy  [facility] for tasks assessed/performed       Past Medical History:  Diagnosis Date  . Anxiety   . Asthma   . Barrett's esophagus   . Cancer ([place])   . DJD (degenerative joint disease)   . GERD (gastroesophageal reflux disease)   . Goiter   . Hemorrhoids   . History of colonoscopy   . History of shingles    face/eye  . History of subdural hematoma   . History of traumatic subdural hematoma   . Hx of colonic polyps   . Hyperlipidemia   . Hypertension   . IBS (irritable bowel syndrome)   . Osteoarthritis   . S/P TAVR (transcatheter aortic valve replacement)    23 mm Edwards Sapien 3 transcatheter heart valve placed via percutaneous right transfemoral approach   . Sarcoidosis   . [facility])     Past Surgical History:  Procedure Laterality Date  . BREAST LUMPECTOMY  1974   left  . CATARACT EXTRACTION W/ INTRAOCULAR LENS  IMPLANT, BILATERAL    . CRANIOTOMY Right 01/04/2014   Procedure: CRANIOTOMY HEMATOMA EVACUATION SUBDURAL;  Surgeon: [name], MD;  Location: [place] NEURO ORS;  Service: Neurosurgery;  Laterality: Right;  . CRANIOTOMY N/A 01/08/2014   Procedure: Redo CRANIOTOMY HEMATOMA EVACUATION SUBDURAL RIGHT;  Surgeon: [name], MD;  Location: [place] NEURO ORS;  Service: Neurosurgery;   Laterality: N/A;  Redo CRANIOTOMY HEMATOMA EVACUATION SUBDURAL RIGHT  . CRANIOTOMY Right 01/10/2014   Procedure: Redo CRANIOTOMY HEMATOMA EVACUATION SUBDURAL;  Surgeon: [name], MD;  Location: [place] NEURO ORS;  Service: Neurosurgery;  Laterality: Right;  . DILATION AND CURETTAGE OF UTERUS    . EYE SURGERY    . NASAL SINUS SURGERY    . NECK SURGERY     x 2  . RIGHT/LEFT HEART CATH AND CORONARY ANGIOGRAPHY N/A 09/27/2018   Procedure: RIGHT/LEFT HEART CATH AND CORONARY ANGIOGRAPHY;  Surgeon: Burnell Blanks, MD;  Location: [place] CV LAB;  Service: Cardiovascular;  Laterality: N/A;  . TEE WITHOUT CARDIOVERSION N/A 11/28/2018   Procedure: TRANSESOPHAGEAL ECHOCARDIOGRAM (TEE);  Surgeon: Sherren Mocha, MD;  Location: [place];  Service: Open Heart Surgery;  Laterality: N/A;  . TONSILLECTOMY AND ADENOIDECTOMY  1976  . TOTAL THYROIDECTOMY  06-05-08  . TRANSCATHETER AORTIC VALVE REPLACEMENT, TRANSFEMORAL N/A 11/28/2018   Procedure: TRANSCATHETER AORTIC VALVE REPLACEMENT, TRANSFEMORAL;  Surgeon: Sherren Mocha, MD;  Location: [place];  Service: Open Heart Surgery;  Laterality: N/A;    There were no vitals filed for this visit.   Subjective Assessment - 03/05/19 1157    Subjective  Pt. is a 85 y/o female referred to PT with c/o neck pain. See  past medical history which includes multiple surgeries for neck including ACDF which per pt. was 15-20 years ago. She reports that about 2 months ago she began having radiating pain from her neck to right arm and started having numbness and tingling in bilateral hands. About 4 weeks ago she began to have significant neck pain on the left side. She noticed "hard" area suspected by referring provider as bone spur and in general notes increased left>right neck pain.    Patient is accompained by:  Family member   daughter   Pertinent History  cervical surgical history including ACDF, pt. had TVAR 5/20, history craniotomy 2015    Limitations  Lifting;House  hold activities;Other (comment)   sleep disturbance   Diagnostic tests  no recent imaging    Patient Stated Goals  have less pain    Currently in Pain?  Yes    Pain Score  3     Pain Location  Neck    Pain Orientation  Left    Pain Descriptors / Indicators  Dull    Pain Type  Chronic pain   acute on chronic   Pain Radiating Towards  has intermittent radiating pain into right arm and hand as well as bilateral hand parasthesias, pain local to left cervical region at time of eval    Pain Onset  More than a month ago    Pain Frequency  Constant    Aggravating Factors   lying on left side, chores/activity such as vacumming    Pain Relieving Factors  topical medication    Effect of Pain on Daily Activities  limits tolerance and ability for chores at home, sleep disturbance         [facility] PT Assessment - 03/05/19 0001      Assessment   Medical Diagnosis  Neck pain    Referring Provider (PT)  Hulan Saas, DO    Onset Date/Surgical Date  01/03/19   estimated per subjective report   Hand Dominance  Right    Prior Therapy  none for current episodes      Precautions   Precaution Comments  TAVR cardiac procedure  11/28/18      Restrictions   Weight Bearing Restrictions  No      Balance Screen   Has the patient fallen in the past 6 months  No      [place] residence    Living Arrangements  Spouse/significant other    Home Access  Stairs to enter    Entrance Stairs-Number of Steps  3    Entrance Stairs-Rails  Left    Additional Comments  Pt. lives with her spouse who had ALS for whom she assists as caregiver, home is 2 story but does not have to access 2nd floor/bedroom is downstairs      Prior Function   Level of Independence  Independent with basic ADLs      Cognition   Overall Cognitive Status  Within Functional Limits for tasks assessed      Observation/Other Assessments   Focus on Therapeutic Outcomes (FOTO)   44% limited       Sensation   Light Touch  Appears Intact   bilat. C4-T1 dermatomes     Posture/Postural Control   Posture Comments  rounded shoulders with left scapula elevated>right      ROM / Strength   AROM / PROM / Strength  AROM;Strength      AROM   Overall AROM Comments  Bilat. shoulder AROM grossly [facility]    AROM Assessment Site  Cervical    Cervical Flexion  18    Cervical Extension  38    Cervical - Right Side Bend  16    Cervical - Left Side Bend  14    Cervical - Right Rotation  30    Cervical - Left Rotation  28      Strength   Strength Assessment Site  Shoulder;Elbow;Wrist    Right/Left Shoulder  Right;Left    Right Shoulder Flexion  4+/5    Right Shoulder ABduction  4+/5    Right Shoulder Internal Rotation  5/5    Right Shoulder External Rotation  5/5    Left Shoulder Flexion  4+/5    Left Shoulder ABduction  4+/5    Left Shoulder Internal Rotation  5/5    Left Shoulder External Rotation  5/5    Right/Left Elbow  Right;Left    Right Elbow Flexion  5/5    Right Elbow Extension  5/5    Left Elbow Flexion  5/5    Left Elbow Extension  5/5    Right/Left Wrist  Right;Left    Right Wrist Flexion  5/5    Right Wrist Extension  5/5    Left Wrist Flexion  5/5    Left Wrist Extension  5/5      Flexibility   Soft Tissue Assessment /Muscle Length  --      Palpation   Palpation comment  Palpable bone spur left lateral cervical region, Tender to palpation with trigger points left upper trapezius and levator scapula, TTP bilat. cevrvical paraspinals left>right      Special Tests   Other special tests  unable to assess Spulring's due to insufficient cervical lateral flexion ROM                Objective measurements completed on examination: See above findings.      [place] Adult PT Treatment/Exercise - 03/05/19 0001      Exercises   Exercises  Neck      Neck Exercises: Seated   Other Seated Exercise  Brief HEP instruction gentle upper trapezius and levator stretches,  scapular retraction             PT Education - 03/05/19 1300    Education Details  Symptom etiology, cervical anatomy, HEP, posture, dry needling    Person(s) Educated  Patient;Child(ren)    Methods  Explanation;Demonstration;Tactile cues;Verbal cues;Handout    Comprehension  Verbalized understanding;Returned demonstration;Verbal cues required;Tactile cues required       PT Short Term Goals - 03/05/19 1310      PT SHORT TERM GOAL #1   Title  Independent with HEP    Baseline  needs HEP    Time  3    Period  Weeks    Status  New      PT SHORT TERM GOAL #2   Title  Increase bilat. cervical rotation AROM at least 5-10 deg to improve ability to turn head while driving    Baseline  right 30 deg, left 28 deg    Time  3    Period  Weeks    Status  New        PT Long Term Goals - 03/05/19 1312      PT LONG TERM GOAL #1   Title  Improve FOTO outcome measure score to 44% or less impairment    Baseline  48% impaired    Time  6    Period  Weeks    Status  New    Target Date  04/16/19      PT LONG TERM GOAL #2   Title  Increase bilat. cervical rotation AROM to 45 deg or greater to improve ability to turn head while driving    Baseline  righ 30 deg, left 28 deg    Time  6    Period  Weeks    Status  New    Target Date  04/16/19      PT LONG TERM GOAL #3   Title  Bilat. shoulder strength 5/5 to improve ability for chores such as vacuuming at home    Baseline  4+/5 for flexion and abduction    Time  6    Period  Weeks    Status  New    Target Date  04/16/19      PT LONG TERM GOAL #4   Title  Decrease sleep disturbance symptoms at least 50% or greater from current status    Time  6    Period  Weeks    Status  New    Target Date  04/16/19      PT LONG TERM GOAL #5   Title  Perform ADL/IADLs at home with cervical pain <3/10 at worst    Baseline  high pain level with chores >5/10    Time  6    Period  Weeks    Status  New    Target Date  04/16/19              Plan - 03/05/19 1302    Clinical Impression Statement  Pt. presents with left cervical pain with significant muscle tightness/decreased ROM with mild shoulder weakness. Differential diagnosis for UE radiating symptoms could include cervical radiculopathy. Overall suspect symptoms multi-factorial with underlying bone spur along with myofascial pain and potential radicular symptoms. Pt. would benefit from PT to help relieve pain and address current associated functional limitations.    Personal Factors and Comorbidities  Age;Comorbidity 2    Comorbidities  surgical history, cardiac history    Examination-Activity Limitations  Sleep;Lift;Carry;Reach Overhead;Caring for Others    Examination-Participation Restrictions  Laundry;Cleaning    Stability/Clinical Decision Making  Evolving/Moderate complexity    Clinical Decision Making  Moderate    Rehab Potential  Fair    PT Frequency  2x / week    PT Duration  6 weeks    PT Treatment/Interventions  Ultrasound;Cryotherapy;ADLs/Self Care Home Management;Electrical Stimulation;Moist Heat;Functional mobility training;Therapeutic activities;Therapeutic exercise;Manual techniques;Dry needling;Taping;Neuromuscular re-education;Patient/family education    PT Next Visit Plan  review HEP as needed, dry needle left upper trapezius, levator, cervical paraspinals as needed, cervical retractions, STM, gentle stretches, potential trial flexion bias ROM, modalities prn for pain    PT Home Exercise Plan  upper trapezius and levator stretches, scapular retractions    Consulted and Agree with Plan of Care  Patient;Family member/caregiver    Family Member Consulted  daughter       Patient will benefit from skilled therapeutic intervention in order to improve the following deficits and impairments:  Pain, Postural dysfunction, Impaired UE functional use, Impaired flexibility, Increased fascial restricitons, Decreased strength, Decreased range of motion,  Impaired tone, Hypomobility  Visit Diagnosis: 1. Cervicalgia        Problem List Patient Active Problem List   Diagnosis Date Noted  . History of traumatic subdural hematoma   . S/P TAVR (transcatheter aortic valve replacement)   . Severe aortic  stenosis   . Neck arthritis 09/08/2018  . Multiple fractures of ribs of left side 12/24/2013  . Urinary retention 12/24/2013  . CAD (coronary artery disease) 04/20/2013  . Hypothyroidism 07/09/2010  . CONSTIPATION 07/09/2010  . HEMATEMESIS 08/14/2008  . Sarcoidosis 08/09/2008  . Hyperlipidemia 08/09/2008  . ANXIETY 08/09/2008  . HEMORRHOIDS 08/09/2008  . GERD 08/09/2008  . BARRETTS ESOPHAGUS 08/09/2008  . Irritable bowel syndrome 08/09/2008  . DEGENERATIVE JOINT DISEASE 08/09/2008  . COLONIC POLYPS, ADENOMATOUS, HX OF 08/09/2008  . HELICOBACTER PYLORI GASTRITIS, HX OF 08/09/2008    Beaulah Dinning, PT, DPT 03/05/19 1:18 PM  [facility] [address] [place], [place], [address] Phone: [phone]   Fax:  [phone]  Name: JO BOOZE MRN: [id number] Date of Birth: 1930-10-16

## 2019-03-07 ENCOUNTER — Other Ambulatory Visit: Payer: Self-pay

## 2019-03-07 ENCOUNTER — Encounter: Payer: Self-pay | Admitting: Physical Therapy

## 2019-03-07 ENCOUNTER — Ambulatory Visit: Payer: Medicare Other | Admitting: Physical Therapy

## 2019-03-07 DIAGNOSIS — M542 Cervicalgia: Secondary | ICD-10-CM | POA: Diagnosis not present

## 2019-03-07 DIAGNOSIS — R2689 Other abnormalities of gait and mobility: Secondary | ICD-10-CM

## 2019-03-07 NOTE — Therapy (Signed)
[place], [place], [address] Phone: [phone]   Fax:  [phone]  Physical Therapy Treatment  Patient Details  Name: Brenda [name] MRN: [id number] Date of Birth: [birth year] Referring Provider (PT): Hulan Saas, DO   Encounter Date: 03/07/2019  PT End of Session - 03/07/19 1646    Visit Number  2    Number of Visits  12    Date for PT Re-Evaluation  04/16/19    Authorization Type  Medicare, Kx at visit 15 and progress note by visit 10    PT Start Time  1645    PT Stop Time  1711    PT Time Calculation (min)  26 min    Activity Tolerance  Patient tolerated treatment well    Behavior During Therapy  [facility] for tasks assessed/performed       Past Medical History:  Diagnosis Date  . Anxiety   . Asthma   . Barrett's esophagus   . Cancer ([place])   . DJD (degenerative joint disease)   . GERD (gastroesophageal reflux disease)   . Goiter   . Hemorrhoids   . History of colonoscopy   . History of shingles    face/eye  . History of subdural hematoma   . History of traumatic subdural hematoma   . Hx of colonic polyps   . Hyperlipidemia   . Hypertension   . IBS (irritable bowel syndrome)   . Osteoarthritis   . S/P TAVR (transcatheter aortic valve replacement)    23 mm Edwards Sapien 3 transcatheter heart valve placed via percutaneous right transfemoral approach   . Sarcoidosis   . [facility])     Past Surgical History:  Procedure Laterality Date  . BREAST LUMPECTOMY  1974   left  . CATARACT EXTRACTION W/ INTRAOCULAR LENS  IMPLANT, BILATERAL    . CRANIOTOMY Right 01/04/2014   Procedure: CRANIOTOMY HEMATOMA EVACUATION SUBDURAL;  Surgeon: [name], MD;  Location: [place] NEURO ORS;  Service: Neurosurgery;  Laterality: Right;  . CRANIOTOMY N/A 01/08/2014   Procedure: Redo CRANIOTOMY HEMATOMA EVACUATION SUBDURAL RIGHT;  Surgeon: [name], MD;  Location: [place] NEURO ORS;  Service: Neurosurgery;   Laterality: N/A;  Redo CRANIOTOMY HEMATOMA EVACUATION SUBDURAL RIGHT  . CRANIOTOMY Right 01/10/2014   Procedure: Redo CRANIOTOMY HEMATOMA EVACUATION SUBDURAL;  Surgeon: [name], MD;  Location: [place] NEURO ORS;  Service: Neurosurgery;  Laterality: Right;  . DILATION AND CURETTAGE OF UTERUS    . EYE SURGERY    . NASAL SINUS SURGERY    . NECK SURGERY     x 2  . RIGHT/LEFT HEART CATH AND CORONARY ANGIOGRAPHY N/A 09/27/2018   Procedure: RIGHT/LEFT HEART CATH AND CORONARY ANGIOGRAPHY;  Surgeon: Burnell Blanks, MD;  Location: [place] CV LAB;  Service: Cardiovascular;  Laterality: N/A;  . TEE WITHOUT CARDIOVERSION N/A 11/28/2018   Procedure: TRANSESOPHAGEAL ECHOCARDIOGRAM (TEE);  Surgeon: Sherren Mocha, MD;  Location: [place];  Service: Open Heart Surgery;  Laterality: N/A;  . TONSILLECTOMY AND ADENOIDECTOMY  1976  . TOTAL THYROIDECTOMY  06-05-08  . TRANSCATHETER AORTIC VALVE REPLACEMENT, TRANSFEMORAL N/A 11/28/2018   Procedure: TRANSCATHETER AORTIC VALVE REPLACEMENT, TRANSFEMORAL;  Surgeon: Sherren Mocha, MD;  Location: [place];  Service: Open Heart Surgery;  Laterality: N/A;    There were no vitals filed for this visit.  Subjective Assessment - 03/07/19 1646    Subjective  the knot is sore in my neck and there is a new on ein my  shoulder.                       [place] Adult PT Treatment/Exercise - 03/07/19 0001      Neck Exercises: Seated   Shoulder Shrugs  10 reps    Shoulder Rolls  10 reps;Backwards    Other Seated Exercise  external rotation yellow tband      Manual Therapy   Manual therapy comments  skilled palpation and monitoring during TPDN       Trigger Point Dry Needling - 03/07/19 0001    Consent Given?  Yes    Education Handout Provided  --   verbal education   Muscles Treated Head and Neck  Upper trapezius    Upper Trapezius Response  Twitch reponse elicited;Palpable increased muscle length   Left          PT Education - 03/07/19 1713     Education Details  TPDN & expected outcomes    Person(s) Educated  Patient;Child(ren)    Methods  Explanation    Comprehension  Verbalized understanding       PT Short Term Goals - 03/05/19 1310      PT SHORT TERM GOAL #1   Title  Independent with HEP    Baseline  needs HEP    Time  3    Period  Weeks    Status  New      PT SHORT TERM GOAL #2   Title  Increase bilat. cervical rotation AROM at least 5-10 deg to improve ability to turn head while driving    Baseline  right 30 deg, left 28 deg    Time  3    Period  Weeks    Status  New        PT Long Term Goals - 03/05/19 1312      PT LONG TERM GOAL #1   Title  Improve FOTO outcome measure score to 44% or less impairment    Baseline  48% impaired    Time  6    Period  Weeks    Status  New    Target Date  04/16/19      PT LONG TERM GOAL #2   Title  Increase bilat. cervical rotation AROM to 45 deg or greater to improve ability to turn head while driving    Baseline  righ 30 deg, left 28 deg    Time  6    Period  Weeks    Status  New    Target Date  04/16/19      PT LONG TERM GOAL #3   Title  Bilat. shoulder strength 5/5 to improve ability for chores such as vacuuming at home    Baseline  4+/5 for flexion and abduction    Time  6    Period  Weeks    Status  New    Target Date  04/16/19      PT LONG TERM GOAL #4   Title  Decrease sleep disturbance symptoms at least 50% or greater from current status    Time  6    Period  Weeks    Status  New    Target Date  04/16/19      PT LONG TERM GOAL #5   Title  Perform ADL/IADLs at home with cervical pain <3/10 at worst    Baseline  high pain level with chores >5/10    Time  6    Period  Weeks  Status  New    Target Date  04/16/19            Plan - 03/07/19 1711    Clinical Impression Statement  Pt frustrated that daughter would not be allowed into building but she was brought back for assitance with memory loss and subjective assessment. Good tolerance  to dry needling and educated on importance of posture/exercises to maintain gains.    PT Treatment/Interventions  Ultrasound;Cryotherapy;ADLs/Self Care Home Management;Electrical Stimulation;Moist Heat;Functional mobility training;Therapeutic activities;Therapeutic exercise;Manual techniques;Dry needling;Taping;Neuromuscular re-education;Patient/family education    PT Next Visit Plan  DN PRN, stretching, cervical ROM-potential flexion bias, modalities PRN    PT Home Exercise Plan  upper trapezius and levator stretches, scapular retractions, ER yellow, shoulder rolls    Consulted and Agree with Plan of Care  Patient;Family member/caregiver    Family Member Consulted  daughter       Patient will benefit from skilled therapeutic intervention in order to improve the following deficits and impairments:  Pain, Postural dysfunction, Impaired UE functional use, Impaired flexibility, Increased fascial restricitons, Decreased strength, Decreased range of motion, Impaired tone, Hypomobility  Visit Diagnosis: 1. Cervicalgia   2. Other abnormalities of gait and mobility        Problem List Patient Active Problem List   Diagnosis Date Noted  . History of traumatic subdural hematoma   . S/P TAVR (transcatheter aortic valve replacement)   . Severe aortic stenosis   . Neck arthritis 09/08/2018  . Multiple fractures of ribs of left side 12/24/2013  . Urinary retention 12/24/2013  . CAD (coronary artery disease) 04/20/2013  . Hypothyroidism 07/09/2010  . CONSTIPATION 07/09/2010  . HEMATEMESIS 08/14/2008  . Sarcoidosis 08/09/2008  . Hyperlipidemia 08/09/2008  . ANXIETY 08/09/2008  . HEMORRHOIDS 08/09/2008  . GERD 08/09/2008  . BARRETTS ESOPHAGUS 08/09/2008  . Irritable bowel syndrome 08/09/2008  . DEGENERATIVE JOINT DISEASE 08/09/2008  . COLONIC POLYPS, ADENOMATOUS, HX OF 08/09/2008  . [place], HX OF 08/09/2008  Jamil Armwood C. Taygen Newsome PT, DPT 03/07/19 5:15 PM   [facility]  Health Outpatient Rehabilitation [facility] [address] [place], [place], [address] Phone: [phone]   Fax:  [phone]  Name: Brenda [name] MRN: [id number] Date of Birth: 16-Jan-1931

## 2019-03-15 ENCOUNTER — Ambulatory Visit: Payer: Medicare Other | Admitting: Physical Therapy

## 2019-03-15 ENCOUNTER — Encounter: Payer: Self-pay | Admitting: Physical Therapy

## 2019-03-15 ENCOUNTER — Other Ambulatory Visit: Payer: Self-pay

## 2019-03-15 DIAGNOSIS — M542 Cervicalgia: Secondary | ICD-10-CM | POA: Diagnosis not present

## 2019-03-15 DIAGNOSIS — R2689 Other abnormalities of gait and mobility: Secondary | ICD-10-CM | POA: Diagnosis not present

## 2019-03-15 NOTE — Therapy (Signed)
[place], [place], [address] Phone: [phone]   Fax:  [phone]  Physical Therapy Treatment  Patient Details  Name: Brenda [name] MRN: [id number] Date of Birth: [birth year] Referring Provider (PT): Hulan Saas, DO   Encounter Date: 03/15/2019  PT End of Session - 03/15/19 1456    Visit Number  3    Number of Visits  12    Date for PT Re-Evaluation  04/16/19    Authorization Type  Medicare, Kx at visit 15 and progress note by visit 10    PT Start Time  1416    PT Stop Time  1456    PT Time Calculation (min)  40 min    Activity Tolerance  Patient tolerated treatment well    Behavior During Therapy  [facility] for tasks assessed/performed       Past Medical History:  Diagnosis Date  . Anxiety   . Asthma   . Barrett's esophagus   . Cancer ([place])   . DJD (degenerative joint disease)   . GERD (gastroesophageal reflux disease)   . Goiter   . Hemorrhoids   . History of colonoscopy   . History of shingles    face/eye  . History of subdural hematoma   . History of traumatic subdural hematoma   . Hx of colonic polyps   . Hyperlipidemia   . Hypertension   . IBS (irritable bowel syndrome)   . Osteoarthritis   . S/P TAVR (transcatheter aortic valve replacement)    23 mm Edwards Sapien 3 transcatheter heart valve placed via percutaneous right transfemoral approach   . Sarcoidosis   . [facility])     Past Surgical History:  Procedure Laterality Date  . BREAST LUMPECTOMY  1974   left  . CATARACT EXTRACTION W/ INTRAOCULAR LENS  IMPLANT, BILATERAL    . CRANIOTOMY Right 01/04/2014   Procedure: CRANIOTOMY HEMATOMA EVACUATION SUBDURAL;  Surgeon: [name], MD;  Location: [place] [facility];  Service: Neurosurgery;  Laterality: Right;  . CRANIOTOMY N/A 01/08/2014   Procedure: Redo CRANIOTOMY HEMATOMA EVACUATION SUBDURAL RIGHT;  Surgeon: [name], MD;  Location: [place] [facility];  Service: Neurosurgery;   Laterality: N/A;  Redo CRANIOTOMY HEMATOMA EVACUATION SUBDURAL RIGHT  . CRANIOTOMY Right 01/10/2014   Procedure: Redo CRANIOTOMY HEMATOMA EVACUATION SUBDURAL;  Surgeon: [name], MD;  Location: [place] [facility];  Service: Neurosurgery;  Laterality: Right;  . DILATION AND CURETTAGE OF UTERUS    . EYE SURGERY    . NASAL SINUS SURGERY    . NECK SURGERY     x 2  . RIGHT/LEFT HEART CATH AND CORONARY ANGIOGRAPHY N/A 09/27/2018   Procedure: RIGHT/LEFT HEART CATH AND CORONARY ANGIOGRAPHY;  Surgeon: Burnell Blanks, MD;  Location: [place] CV LAB;  Service: Cardiovascular;  Laterality: N/A;  . TEE WITHOUT CARDIOVERSION N/A 11/28/2018   Procedure: TRANSESOPHAGEAL ECHOCARDIOGRAM (TEE);  Surgeon: Sherren Mocha, MD;  Location: [place];  Service: Open Heart Surgery;  Laterality: N/A;  . TONSILLECTOMY AND ADENOIDECTOMY  1976  . TOTAL THYROIDECTOMY  06-05-08  . TRANSCATHETER AORTIC VALVE REPLACEMENT, TRANSFEMORAL N/A 11/28/2018   Procedure: TRANSCATHETER AORTIC VALVE REPLACEMENT, TRANSFEMORAL;  Surgeon: Sherren Mocha, MD;  Location: [place];  Service: Open Heart Surgery;  Laterality: N/A;    There were no vitals filed for this visit.  Subjective Assessment - 03/15/19 1417    Subjective  Pt. reports last treatment seemed to help-able to sleep on left side last night. Minimal  soreness after treatment. She requests to defer dry needling today.    Pertinent History  cervical surgical history including ACDF, pt. had TVAR 5/20, history craniotomy 2015    Limitations  Lifting;House hold activities;Other (comment)    Diagnostic tests  no recent imaging    Patient Stated Goals  have less pain    Currently in Pain?  Yes    Pain Score  1     Pain Location  Neck    Pain Orientation  Left    Pain Descriptors / Indicators  Dull    Pain Type  Chronic pain    Pain Onset  More than a month ago    Pain Frequency  Constant    Aggravating Factors   lyying on left side, chores, activity    Pain Relieving  Factors  topical medication    Effect of Pain on Daily Activities  limits ability for chores at home, sleep disturbance         [facility] PT Assessment - 03/15/19 0001      AROM   Cervical - Right Rotation  37    Cervical - Left Rotation  37                   [facility] Adult PT Treatment/Exercise - 03/15/19 0001      Neck Exercises: Supine   Neck Retraction  15 reps    Neck Retraction Limitations  supine with gentle manual assistance    Capital Flexion Limitations  cervical retraction to manually assisted flexion x 15 reps    Other Supine Exercise  supine Theraband horizontal abduction x 15 reps red band      Manual Therapy   Manual Therapy  Soft tissue mobilization;Manual Traction    Soft tissue mobilization  supine and seated for cervical paraspainals and left>right upper trapezius and levator region    Manual Traction  gentle manual traction and suboccipital release      Neck Exercises: Stretches   Upper Trapezius Stretch  Left;3 reps;20 seconds   supine manual stretch   Levator Stretch  Left;3 reps;20 seconds   supine manual stretch   Chest Stretch  3 reps;20 seconds   supine manual pec minor stretch            PT Education - 03/15/19 1456    Education Details  Progress, POC    Person(s) Educated  Patient;Child(ren)    Methods  Explanation    Comprehension  Verbalized understanding       PT Short Term Goals - 03/15/19 1454      PT SHORT TERM GOAL #1   Title  Independent with HEP    Baseline  met    Time  3    Period  Weeks    Status  Achieved      PT SHORT TERM GOAL #2   Title  Increase bilat. cervical rotation AROM at least 5-10 deg to improve ability to turn head while driving    Baseline  37 deg bilat.    Time  3    Period  Weeks        PT Long Term Goals - 03/05/19 1312      PT LONG TERM GOAL #1   Title  Improve FOTO outcome measure score to 44% or less impairment    Baseline  48% impaired    Time  6    Period  Weeks    Status   New    Target Date  04/16/19      PT LONG TERM GOAL #2   Title  Increase bilat. cervical rotation AROM to 45 deg or greater to improve ability to turn head while driving    Baseline  righ 30 deg, left 28 deg    Time  6    Period  Weeks    Status  New    Target Date  04/16/19      PT LONG TERM GOAL #3   Title  Bilat. shoulder strength 5/5 to improve ability for chores such as vacuuming at home    Baseline  4+/5 for flexion and abduction    Time  6    Period  Weeks    Status  New    Target Date  04/16/19      PT LONG TERM GOAL #4   Title  Decrease sleep disturbance symptoms at least 50% or greater from current status    Time  6    Period  Weeks    Status  New    Target Date  04/16/19      PT LONG TERM GOAL #5   Title  Perform ADL/IADLs at home with cervical pain <3/10 at worst    Baseline  high pain level with chores >5/10    Time  6    Period  Weeks    Status  New    Target Date  04/16/19            Plan - 03/15/19 1457    Clinical Impression Statement  Pt. showing progress from baseline at eval with decreased pain/improved positional tolerance with decreased sleep disturbance as well as cervical ROM gains for rotation AROM with STG for this met.    Personal Factors and Comorbidities  Age;Comorbidity 2    Comorbidities  surgical history, cardiac history    Examination-Activity Limitations  Sleep;Lift;Carry;Reach Overhead;Caring for Others    Stability/Clinical Decision Making  Evolving/Moderate complexity    Clinical Decision Making  Moderate    Rehab Potential  Fair    PT Frequency  2x / week    PT Duration  6 weeks    PT Treatment/Interventions  Ultrasound;Cryotherapy;ADLs/Self Care Home Management;Electrical Stimulation;Moist Heat;Functional mobility training;Therapeutic activities;Therapeutic exercise;Manual techniques;Dry needling;Taping;Neuromuscular re-education;Patient/family education    PT Next Visit Plan  DN PRN, stretching, cervical ROM-potential flexion  bias, modalities PRN    PT Home Exercise Plan  upper trapezius and levator stretches, scapular retractions, ER yellow, shoulder rolls    Consulted and Agree with Plan of Care  Patient;Family member/caregiver       Patient will benefit from skilled therapeutic intervention in order to improve the following deficits and impairments:  Pain, Postural dysfunction, Impaired UE functional use, Impaired flexibility, Increased fascial restricitons, Decreased strength, Decreased range of motion, Impaired tone, Hypomobility  Visit Diagnosis: Cervicalgia     Problem List Patient Active Problem List   Diagnosis Date Noted  . History of traumatic subdural hematoma   . S/P TAVR (transcatheter aortic valve replacement)   . Severe aortic stenosis   . Neck arthritis 09/08/2018  . Multiple fractures of ribs of left side 12/24/2013  . Urinary retention 12/24/2013  . CAD (coronary artery disease) 04/20/2013  . Hypothyroidism 07/09/2010  . CONSTIPATION 07/09/2010  . HEMATEMESIS 08/14/2008  . Sarcoidosis 08/09/2008  . Hyperlipidemia 08/09/2008  . ANXIETY 08/09/2008  . HEMORRHOIDS 08/09/2008  . GERD 08/09/2008  . BARRETTS ESOPHAGUS 08/09/2008  . Irritable bowel syndrome 08/09/2008  . DEGENERATIVE JOINT DISEASE 08/09/2008  . COLONIC  POLYPS, ADENOMATOUS, HX OF 08/09/2008  . HELICOBACTER PYLORI GASTRITIS, HX OF 08/09/2008   Beaulah Dinning, PT, DPT 03/15/19 2:58 PM  [place] [facility] [address] [place], [place], [address] Phone: [phone]   Fax:  [phone]  Name: DORIANN ZUCH MRN: [id number] Date of Birth: 1931-02-09

## 2019-03-19 ENCOUNTER — Ambulatory Visit: Payer: Medicare Other | Admitting: Physical Therapy

## 2019-03-22 ENCOUNTER — Ambulatory Visit: Payer: Medicare Other | Admitting: Physical Therapy

## 2019-03-22 ENCOUNTER — Telehealth: Payer: Self-pay | Admitting: Physical Therapy

## 2019-03-22 NOTE — Telephone Encounter (Signed)
Called patient to check on status due to no show for 11 AM appointment-left voicemail to return call to reschedule.

## 2019-03-26 DIAGNOSIS — I10 Essential (primary) hypertension: Secondary | ICD-10-CM | POA: Diagnosis not present

## 2019-03-26 DIAGNOSIS — E890 Postprocedural hypothyroidism: Secondary | ICD-10-CM | POA: Diagnosis not present

## 2019-03-26 DIAGNOSIS — E782 Mixed hyperlipidemia: Secondary | ICD-10-CM | POA: Diagnosis not present

## 2019-03-26 DIAGNOSIS — N189 Chronic kidney disease, unspecified: Secondary | ICD-10-CM | POA: Diagnosis not present

## 2019-03-26 DIAGNOSIS — E039 Hypothyroidism, unspecified: Secondary | ICD-10-CM | POA: Diagnosis not present

## 2019-03-27 DIAGNOSIS — H35363 Drusen (degenerative) of macula, bilateral: Secondary | ICD-10-CM | POA: Diagnosis not present

## 2019-03-27 DIAGNOSIS — H3540 Unspecified peripheral retinal degeneration: Secondary | ICD-10-CM | POA: Diagnosis not present

## 2019-03-27 DIAGNOSIS — H35373 Puckering of macula, bilateral: Secondary | ICD-10-CM | POA: Diagnosis not present

## 2019-03-27 DIAGNOSIS — H35423 Microcystoid degeneration of retina, bilateral: Secondary | ICD-10-CM | POA: Diagnosis not present

## 2019-04-03 ENCOUNTER — Ambulatory Visit: Payer: Medicare Other | Admitting: Family Medicine

## 2019-04-03 DIAGNOSIS — E7800 Pure hypercholesterolemia, unspecified: Secondary | ICD-10-CM | POA: Diagnosis not present

## 2019-04-03 DIAGNOSIS — E782 Mixed hyperlipidemia: Secondary | ICD-10-CM | POA: Diagnosis not present

## 2019-04-03 DIAGNOSIS — N189 Chronic kidney disease, unspecified: Secondary | ICD-10-CM | POA: Diagnosis not present

## 2019-04-03 DIAGNOSIS — I10 Essential (primary) hypertension: Secondary | ICD-10-CM | POA: Diagnosis not present

## 2019-04-03 DIAGNOSIS — E890 Postprocedural hypothyroidism: Secondary | ICD-10-CM | POA: Diagnosis not present

## 2019-04-03 DIAGNOSIS — E039 Hypothyroidism, unspecified: Secondary | ICD-10-CM | POA: Diagnosis not present

## 2019-04-14 NOTE — Progress Notes (Signed)
Cardiology Office Note   Date:  04/16/2019   ID:  Shikara, Priestley [birth year], MRN [id number]  PCP:  [name], MD  Cardiologist:   [name], MD   F/U of AV disease and HTN      History of Present Illness: YASLINE WILEY is a 85 y.o. female with a history ofaortic stenosiss/p TAVR 11/28/18, HTN, HLD, sarcoidosis, GE reflux disease with Barrett's esophagus, chronic  cervical spine issue  and osteoarthritis   She underwentsuccessful TAVR with a44mm Edwards Sapien 3 THV via the TF approach on 11/28/2018. Post operativeecho showed normally functioning TAVR with mean gradient of 8 mm Hg and no PVL. She was started on norvasc 5 mg on follow up for elevated blood pressure.  The pt was last seen by B Bhagat in July   BP elevated, dizzy at time    Since seen she denies dizziness    Breathing OK   No CP   No palpitations   Busy at home taking care of husband who has ALS        Current Meds  Medication Sig  . acetaminophen (TYLENOL) 650 MG CR tablet Take 650 mg by mouth daily.   Marland Kitchen ALPRAZolam (XANAX) 0.5 MG tablet Take 0.5 mg by mouth See admin instructions. Take 0.5 mg in the morning and 1 mg at night  . amLODipine (NORVASC) 5 MG tablet Take 1 tablet (5 mg total) by mouth daily.  Marland Kitchen amoxicillin (AMOXIL) 500 MG tablet Take 4 tablets (2,000 mg total) by mouth as directed. 1 hour prior to dental work including cleanings  . aspirin 81 MG chewable tablet Chew 1 tablet (81 mg total) by mouth daily.  . Biotin 5000 MCG TABS Take 5,000 mcg by mouth at bedtime.   . Cholecalciferol (VITAMIN D) 125 MCG (5000 UT) CAPS Take 5,000 Units by mouth at bedtime.   . clopidogrel (PLAVIX) 75 MG tablet Take 1 tablet (75 mg total) by mouth daily with breakfast.  . cycloSPORINE (RESTASIS) 0.05 % ophthalmic emulsion Place 1 drop into both eyes every 12 (twelve) hours.   . diclofenac sodium (VOLTAREN) 1 % GEL Apply 2 g topically daily. To affected joint.  . diphenhydramine-acetaminophen (TYLENOL PM)  25-500 MG TABS tablet Take 1 tablet by mouth at bedtime.  . Fluticasone Propionate (FLONASE ALLERGY RELIEF NA) Place into the nose daily.  Marland Kitchen GLUCOSAMINE-CHONDROITIN PO Take 1 capsule by mouth 3 (three) times daily.  Marland Kitchen levothyroxine (SYNTHROID, LEVOTHROID) 75 MCG tablet Take 75 mcg by mouth every other day. Alternate 75 mcg one day and 88 mcg the next  . Liniments (SALONPAS PAIN RELIEF PATCH EX) Apply 1 patch topically daily.  . Menthol, Topical Analgesic, (BLUE-EMU MAXIMUM STRENGTH EX) Apply 1 application topically daily as needed (knee pain).  . Multiple Vitamins-Minerals (MULTIVITAMIN ADULT) CHEW Chew 2 each by mouth daily.  . rosuvastatin (CRESTOR) 40 MG tablet TAKE 1 TABLET (40 MG TOTAL) BY MOUTH DAILY.  Marland Kitchen senna-docusate (SENNA S) 8.6-50 MG tablet Take 3 tablets by mouth at bedtime.   Marland Kitchen spironolactone (ALDACTONE) 25 MG tablet Take 25 mg by mouth daily.  . vitamin B-12 (CYANOCOBALAMIN) 1000 MCG tablet Take 1,000 mcg by mouth at bedtime.     Allergies:   Patient has no known allergies.   Past Medical History:  Diagnosis Date  . Anxiety   . Asthma   . Barrett's esophagus   . Cancer ([place])   . DJD (degenerative joint disease)   . GERD (gastroesophageal reflux disease)   .  Goiter   . Hemorrhoids   . History of colonoscopy   . History of shingles    face/eye  . History of subdural hematoma   . History of traumatic subdural hematoma   . Hx of colonic polyps   . Hyperlipidemia   . Hypertension   . IBS (irritable bowel syndrome)   . Osteoarthritis   . S/P TAVR (transcatheter aortic valve replacement)    23 mm Edwards Sapien 3 transcatheter heart valve placed via percutaneous right transfemoral approach   . Sarcoidosis   . [facility])     Past Surgical History:  Procedure Laterality Date  . BREAST LUMPECTOMY  1974   left  . CATARACT EXTRACTION W/ INTRAOCULAR LENS  IMPLANT, BILATERAL    . CRANIOTOMY Right 01/04/2014   Procedure: CRANIOTOMY HEMATOMA EVACUATION SUBDURAL;   Surgeon: [name], MD;  Location: [place] [facility];  Service: Neurosurgery;  Laterality: Right;  . CRANIOTOMY N/A 01/08/2014   Procedure: Redo CRANIOTOMY HEMATOMA EVACUATION SUBDURAL RIGHT;  Surgeon: [name], MD;  Location: [place] [facility];  Service: Neurosurgery;  Laterality: N/A;  Redo CRANIOTOMY HEMATOMA EVACUATION SUBDURAL RIGHT  . CRANIOTOMY Right 01/10/2014   Procedure: Redo CRANIOTOMY HEMATOMA EVACUATION SUBDURAL;  Surgeon: [name], MD;  Location: [place] [facility];  Service: Neurosurgery;  Laterality: Right;  . DILATION AND CURETTAGE OF UTERUS    . EYE SURGERY    . NASAL SINUS SURGERY    . NECK SURGERY     x 2  . RIGHT/LEFT HEART CATH AND CORONARY ANGIOGRAPHY N/A 09/27/2018   Procedure: RIGHT/LEFT HEART CATH AND CORONARY ANGIOGRAPHY;  Surgeon: Burnell Blanks, MD;  Location: [place] CV LAB;  Service: Cardiovascular;  Laterality: N/A;  . TEE WITHOUT CARDIOVERSION N/A 11/28/2018   Procedure: TRANSESOPHAGEAL ECHOCARDIOGRAM (TEE);  Surgeon: Sherren Mocha, MD;  Location: [place];  Service: Open Heart Surgery;  Laterality: N/A;  . TONSILLECTOMY AND ADENOIDECTOMY  1976  . TOTAL THYROIDECTOMY  06-05-08  . TRANSCATHETER AORTIC VALVE REPLACEMENT, TRANSFEMORAL N/A 11/28/2018   Procedure: TRANSCATHETER AORTIC VALVE REPLACEMENT, TRANSFEMORAL;  Surgeon: Sherren Mocha, MD;  Location: [place];  Service: Open Heart Surgery;  Laterality: N/A;     Social History:  The patient  reports that she has never smoked. She has never used smokeless tobacco. She reports current alcohol use. She reports that she does not use drugs.   Family History:  The patient's family history includes Asthma in her sister; Colon cancer in her sister; Heart disease in her father; Heart failure in her father; Hyperlipidemia in her father and mother; Hypertension in her brother, father, mother, and sister; Kidney cancer in her mother; Lymphoma in her mother; Rheum arthritis in her father; Thyroid cancer in her  sister.    ROS:  Please see the history of present illness. All other systems are reviewed and  Negative to the above problem except as noted.    PHYSICAL EXAM: VS:  BP 133/80   Pulse 78   Ht 5' 3.5" (1.613 m)   Wt 144 lb 9.6 oz (65.6 kg)   BMI 25.21 kg/m   GEN: Well nourished, well developed, in no acute distress  HEENT: normal  Neck: no JVD, carotid bruits Cardiac: RRR; Gr I-II/VI systolic murmur base   No diastolic murmurs  No  rubs, or gallops,no edema  Respiratory:  clear to auscultation bilaterally, normal work of breathing GI: soft, nontender, nondistended, + BS  No hepatomegaly  MS: no deformity Moving all extremities   Skin:  warm and dry, no rash Neuro:  Strength and sensation are intact Psych: euthymic mood, full affect   EKG:  EKG is not ordered today.   Lipid Panel    Component Value Date/Time   CHOL 147 08/10/2016 0818   TRIG 161 (H) 08/10/2016 0818   HDL 56 08/10/2016 0818   CHOLHDL 2.6 08/10/2016 0818   LDLCALC 59 08/10/2016 0818      Wt Readings from Last 3 Encounters:  04/16/19 144 lb 9.6 oz (65.6 kg)  02/19/19 143 lb (64.9 kg)  02/06/19 140 lb 1.9 oz (63.6 kg)      ASSESSMENT AND PLAN:  1  AV disease   Pt s/p TAVR in May 2020   Echo after shows valve is functioning well    She will finish plavix in November and continue with ASA  2  HTN  BP is controlled  Keep on same regimen  3   HL   LIpids good in 2018   She has appt soon with Dr little for labs     Will get   4  Dizziness  Pt deneis  May have been related to BP   Follow     F/U in the spring   Sooner for problems     Current medicines are reviewed at length with the patient today.  The patient does not have concerns regarding medicines.  Signed, [name], MD  04/16/2019 11:18 AM    [place], [place], [place]  [address] Phone: [phone]; Fax: [phone]

## 2019-04-16 ENCOUNTER — Other Ambulatory Visit: Payer: Self-pay

## 2019-04-16 ENCOUNTER — Encounter: Payer: Self-pay | Admitting: Internal Medicine

## 2019-04-16 ENCOUNTER — Ambulatory Visit (INDEPENDENT_AMBULATORY_CARE_PROVIDER_SITE_OTHER): Payer: Medicare Other | Admitting: Internal Medicine

## 2019-04-16 VITALS — BP 133/80 | HR 78 | Ht 63.5 in | Wt 144.6 lb

## 2019-04-16 DIAGNOSIS — I359 Nonrheumatic aortic valve disorder, unspecified: Secondary | ICD-10-CM

## 2019-04-16 DIAGNOSIS — I10 Essential (primary) hypertension: Secondary | ICD-10-CM

## 2019-04-16 DIAGNOSIS — R42 Dizziness and giddiness: Secondary | ICD-10-CM | POA: Diagnosis not present

## 2019-04-16 DIAGNOSIS — I2510 Atherosclerotic heart disease of native coronary artery without angina pectoris: Secondary | ICD-10-CM | POA: Diagnosis not present

## 2019-04-16 DIAGNOSIS — E782 Mixed hyperlipidemia: Secondary | ICD-10-CM

## 2019-04-16 NOTE — Patient Instructions (Signed)
Medication Instructions:  No changes If you need a refill on your cardiac medications before your next appointment, please call your pharmacy.   Lab work: none If you have labs (blood work) drawn today and your tests are completely normal, you will receive your results only by: Marland Kitchen MyChart Message (if you have MyChart) OR . A paper copy in the mail If you have any lab test that is abnormal or we need to change your treatment, we will call you to review the results.  Testing/Procedures: none  Follow-Up: At [facility], you and your health needs are our priority.  As part of our continuing mission to provide you with exceptional heart care, we have created designated Provider Care Teams.  These Care Teams include your primary Cardiologist (physician) and Advanced Practice Providers (APPs -  Physician Assistants and Nurse Practitioners) who all work together to provide you with the care you need, when you need it. You will need a follow up appointment in:  6 months.  Please call our office 2 months in advance to schedule this appointment.  You may see one of the following Advanced Practice Providers on your designated Care Team: Richardson Dopp, PA-C Vin Gardi, [place] . Daune Perch, NP  Any Other Special Instructions Will Be Listed Below (If Applicable).

## 2019-04-17 DIAGNOSIS — E039 Hypothyroidism, unspecified: Secondary | ICD-10-CM | POA: Diagnosis not present

## 2019-05-10 NOTE — Therapy (Signed)
[place], [place], [address] Phone: [phone]   Fax:  [phone]  Physical Therapy Treatment/Discharge  Patient Details  Name: Brenda [name] MRN: [id number] Date of Birth: 1931-05-21 Referring Provider (PT): Hulan Saas, DO   Encounter Date: 03/15/2019    Past Medical History:  Diagnosis Date  . Anxiety   . Asthma   . Barrett's esophagus   . Cancer ([place])   . DJD (degenerative joint disease)   . GERD (gastroesophageal reflux disease)   . Goiter   . Hemorrhoids   . History of colonoscopy   . History of shingles    face/eye  . History of subdural hematoma   . History of traumatic subdural hematoma   . Hx of colonic polyps   . Hyperlipidemia   . Hypertension   . IBS (irritable bowel syndrome)   . Osteoarthritis   . S/P TAVR (transcatheter aortic valve replacement)    23 mm Edwards Sapien 3 transcatheter heart valve placed via percutaneous right transfemoral approach   . Sarcoidosis   . [facility])     Past Surgical History:  Procedure Laterality Date  . BREAST LUMPECTOMY  1974   left  . CATARACT EXTRACTION W/ INTRAOCULAR LENS  IMPLANT, BILATERAL    . CRANIOTOMY Right 01/04/2014   Procedure: CRANIOTOMY HEMATOMA EVACUATION SUBDURAL;  Surgeon: [name], MD;  Location: [place] NEURO ORS;  Service: Neurosurgery;  Laterality: Right;  . CRANIOTOMY N/A 01/08/2014   Procedure: Redo CRANIOTOMY HEMATOMA EVACUATION SUBDURAL RIGHT;  Surgeon: [name], MD;  Location: [place] NEURO ORS;  Service: Neurosurgery;  Laterality: N/A;  Redo CRANIOTOMY HEMATOMA EVACUATION SUBDURAL RIGHT  . CRANIOTOMY Right 01/10/2014   Procedure: Redo CRANIOTOMY HEMATOMA EVACUATION SUBDURAL;  Surgeon: [name], MD;  Location: [place] NEURO ORS;  Service: Neurosurgery;  Laterality: Right;  . DILATION AND CURETTAGE OF UTERUS    . EYE SURGERY    . NASAL SINUS SURGERY    . NECK SURGERY     x 2  . RIGHT/LEFT HEART CATH AND  CORONARY ANGIOGRAPHY N/A 09/27/2018   Procedure: RIGHT/LEFT HEART CATH AND CORONARY ANGIOGRAPHY;  Surgeon: Burnell Blanks, MD;  Location: [place] CV LAB;  Service: Cardiovascular;  Laterality: N/A;  . TEE WITHOUT CARDIOVERSION N/A 11/28/2018   Procedure: TRANSESOPHAGEAL ECHOCARDIOGRAM (TEE);  Surgeon: Sherren Mocha, MD;  Location: [place];  Service: Open Heart Surgery;  Laterality: N/A;  . TONSILLECTOMY AND ADENOIDECTOMY  1976  . TOTAL THYROIDECTOMY  06-05-08  . TRANSCATHETER AORTIC VALVE REPLACEMENT, TRANSFEMORAL N/A 11/28/2018   Procedure: TRANSCATHETER AORTIC VALVE REPLACEMENT, TRANSFEMORAL;  Surgeon: Sherren Mocha, MD;  Location: [place];  Service: Open Heart Surgery;  Laterality: N/A;    There were no vitals filed for this visit.                              PT Short Term Goals - 03/15/19 1454      PT SHORT TERM GOAL #1   Title  Independent with HEP    Baseline  met    Time  3    Period  Weeks    Status  Achieved      PT SHORT TERM GOAL #2   Title  Increase bilat. cervical rotation AROM at least 5-10 deg to improve ability to turn head while driving    Baseline  37 deg bilat.    Time  3  Period  Weeks        PT Long Term Goals - 03/05/19 1312      PT LONG TERM GOAL #1   Title  Improve FOTO outcome measure score to 44% or less impairment    Baseline  48% impaired    Time  6    Period  Weeks    Status  New    Target Date  04/16/19      PT LONG TERM GOAL #2   Title  Increase bilat. cervical rotation AROM to 45 deg or greater to improve ability to turn head while driving    Baseline  righ 30 deg, left 28 deg    Time  6    Period  Weeks    Status  New    Target Date  04/16/19      PT LONG TERM GOAL #3   Title  Bilat. shoulder strength 5/5 to improve ability for chores such as vacuuming at home    Baseline  4+/5 for flexion and abduction    Time  6    Period  Weeks    Status  New    Target Date  04/16/19      PT LONG TERM  GOAL #4   Title  Decrease sleep disturbance symptoms at least 50% or greater from current status    Time  6    Period  Weeks    Status  New    Target Date  04/16/19      PT LONG TERM GOAL #5   Title  Perform ADL/IADLs at home with cervical pain <3/10 at worst    Baseline  high pain level with chores >5/10    Time  6    Period  Weeks    Status  New    Target Date  04/16/19              Patient will benefit from skilled therapeutic intervention in order to improve the following deficits and impairments:  Pain, Postural dysfunction, Impaired UE functional use, Impaired flexibility, Increased fascial restricitons, Decreased strength, Decreased range of motion, Impaired tone, Hypomobility  Visit Diagnosis: Cervicalgia     Problem List Patient Active Problem List   Diagnosis Date Noted  . History of traumatic subdural hematoma   . S/P TAVR (transcatheter aortic valve replacement)   . Severe aortic stenosis   . Neck arthritis 09/08/2018  . Multiple fractures of ribs of left side 12/24/2013  . Urinary retention 12/24/2013  . CAD (coronary artery disease) 04/20/2013  . Hypothyroidism 07/09/2010  . CONSTIPATION 07/09/2010  . HEMATEMESIS 08/14/2008  . Sarcoidosis 08/09/2008  . Hyperlipidemia 08/09/2008  . ANXIETY 08/09/2008  . HEMORRHOIDS 08/09/2008  . GERD 08/09/2008  . BARRETTS ESOPHAGUS 08/09/2008  . Irritable bowel syndrome 08/09/2008  . DEGENERATIVE JOINT DISEASE 08/09/2008  . COLONIC POLYPS, ADENOMATOUS, HX OF 08/09/2008  . HELICOBACTER PYLORI GASTRITIS, HX OF 08/09/2008      PHYSICAL THERAPY DISCHARGE SUMMARY  Visits from Start of Care: 3  Current functional level related to goals / functional outcomes: Current status unknown-patient did not return for further therapy after last session 03/15/19   Remaining deficits: Unknown   Education / Equipment: NA Plan: Patient agrees to discharge.  Patient goals were not met. Patient is being discharged due to  not returning since the last visit.  ?????         Beaulah Dinning, PT, DPT 05/10/19 4:27 PM    [facility]  [place], [place], [address] Phone: [phone]   Fax:  [phone]  Name: Brenda [name] MRN: [id number] Date of Birth: 1930/10/03

## 2019-06-01 ENCOUNTER — Telehealth: Payer: Self-pay | Admitting: Internal Medicine

## 2019-06-01 NOTE — Telephone Encounter (Signed)
° °  Pt c/o BP issue: STAT if pt c/o blurred vision, one-sided weakness or slurred speech  1. What are your last 5 BP readings? Today: 118/56 HR 76 (L Arm)/ 108/54 HR 76 (R Arm) taken ~5 min apart  Patient only has today's BP reading   2. Are you having any other symptoms (ex. Dizziness, headache, blurred vision, passed out)? Fatigue, fever for about a week , sore throat, runny nose. The patient had a COVID test at [facility] on Tuesday, but they have not gotten the results yet.   3. What is your BP issue? Daughter is worried because the patient's BP is low for her   The patient came off of her blood thinner on 11/4 following a TAVR procedure. The daughter states that Tylenol seems to bring her fever down temporarily, but the fever goes back up as soon as the tylenol wears off.  The patient does not report any SOB. The daughter does not know what to do.

## 2019-06-01 NOTE — Telephone Encounter (Signed)
I spoke to the patient's daughter Lynelle Smoke) who was calling because they are concerned about the patient's BP.  It has been low today, but the patient is not dizzy, SOB or having CP.  She demonstrates CoVid symptoms, and was tested this past Tuesday, no results.    I told her to hydrate herself and monitor over the weekend.  They will reach out on Monday with an update.

## 2019-06-03 DIAGNOSIS — R630 Anorexia: Secondary | ICD-10-CM | POA: Diagnosis not present

## 2019-06-03 DIAGNOSIS — U071 COVID-19: Secondary | ICD-10-CM | POA: Diagnosis not present

## 2019-06-03 DIAGNOSIS — R5383 Other fatigue: Secondary | ICD-10-CM | POA: Diagnosis not present

## 2019-06-03 DIAGNOSIS — R509 Fever, unspecified: Secondary | ICD-10-CM | POA: Diagnosis not present

## 2019-06-03 DIAGNOSIS — J029 Acute pharyngitis, unspecified: Secondary | ICD-10-CM | POA: Diagnosis not present

## 2019-06-03 DIAGNOSIS — R300 Dysuria: Secondary | ICD-10-CM | POA: Diagnosis not present

## 2019-06-05 NOTE — Telephone Encounter (Signed)
Unable to reach pt on any of lines listed Do not see results on COVID test   Must have been done outside [facility] system

## 2019-06-06 ENCOUNTER — Encounter: Payer: Self-pay | Admitting: Internal Medicine

## 2019-06-06 ENCOUNTER — Ambulatory Visit (INDEPENDENT_AMBULATORY_CARE_PROVIDER_SITE_OTHER): Payer: Medicare Other | Admitting: Internal Medicine

## 2019-06-06 DIAGNOSIS — Z5329 Procedure and treatment not carried out because of patient's decision for other reasons: Secondary | ICD-10-CM
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
## 2019-06-07 ENCOUNTER — Telehealth: Payer: Self-pay | Admitting: Internal Medicine

## 2019-06-07 ENCOUNTER — Encounter (HOSPITAL_COMMUNITY): Payer: Self-pay | Admitting: Emergency Medicine

## 2019-06-07 ENCOUNTER — Inpatient Hospital Stay (HOSPITAL_COMMUNITY)
Admission: EM | Admit: 2019-06-07 | Discharge: 2019-06-11 | DRG: 871 | Disposition: A | Discharge status: Home Health | Payer: Medicare Other | Attending: Internal Medicine | Admitting: Internal Medicine

## 2019-06-07 ENCOUNTER — Emergency Department (HOSPITAL_COMMUNITY): Payer: Medicare Other

## 2019-06-07 ENCOUNTER — Other Ambulatory Visit: Payer: Self-pay

## 2019-06-07 DIAGNOSIS — D869 Sarcoidosis, unspecified: Secondary | ICD-10-CM | POA: Diagnosis present

## 2019-06-07 DIAGNOSIS — I2510 Atherosclerotic heart disease of native coronary artery without angina pectoris: Secondary | ICD-10-CM | POA: Diagnosis present

## 2019-06-07 DIAGNOSIS — F419 Anxiety disorder, unspecified: Secondary | ICD-10-CM | POA: Diagnosis present

## 2019-06-07 DIAGNOSIS — J1282 Pneumonia due to coronavirus disease 2019: Secondary | ICD-10-CM | POA: Diagnosis present

## 2019-06-07 DIAGNOSIS — E860 Dehydration: Secondary | ICD-10-CM | POA: Diagnosis present

## 2019-06-07 DIAGNOSIS — E890 Postprocedural hypothyroidism: Secondary | ICD-10-CM | POA: Diagnosis present

## 2019-06-07 DIAGNOSIS — I9589 Other hypotension: Secondary | ICD-10-CM | POA: Diagnosis not present

## 2019-06-07 DIAGNOSIS — Z7902 Long term (current) use of antithrombotics/antiplatelets: Secondary | ICD-10-CM

## 2019-06-07 DIAGNOSIS — I10 Essential (primary) hypertension: Secondary | ICD-10-CM | POA: Diagnosis not present

## 2019-06-07 DIAGNOSIS — Z952 Presence of prosthetic heart valve: Secondary | ICD-10-CM

## 2019-06-07 DIAGNOSIS — N179 Acute kidney failure, unspecified: Secondary | ICD-10-CM | POA: Diagnosis present

## 2019-06-07 DIAGNOSIS — A419 Sepsis, unspecified organism: Secondary | ICD-10-CM | POA: Diagnosis not present

## 2019-06-07 DIAGNOSIS — N170 Acute kidney failure with tubular necrosis: Secondary | ICD-10-CM | POA: Diagnosis not present

## 2019-06-07 DIAGNOSIS — E785 Hyperlipidemia, unspecified: Secondary | ICD-10-CM | POA: Diagnosis present

## 2019-06-07 DIAGNOSIS — R0902 Hypoxemia: Secondary | ICD-10-CM | POA: Diagnosis not present

## 2019-06-07 DIAGNOSIS — Z961 Presence of intraocular lens: Secondary | ICD-10-CM | POA: Diagnosis present

## 2019-06-07 DIAGNOSIS — Z79899 Other long term (current) drug therapy: Secondary | ICD-10-CM

## 2019-06-07 DIAGNOSIS — Z8349 Family history of other endocrine, nutritional and metabolic diseases: Secondary | ICD-10-CM

## 2019-06-07 DIAGNOSIS — N189 Chronic kidney disease, unspecified: Secondary | ICD-10-CM | POA: Diagnosis present

## 2019-06-07 DIAGNOSIS — J1289 Other viral pneumonia: Secondary | ICD-10-CM | POA: Diagnosis present

## 2019-06-07 DIAGNOSIS — N183 Chronic kidney disease, stage 3 unspecified: Secondary | ICD-10-CM | POA: Diagnosis present

## 2019-06-07 DIAGNOSIS — Z8249 Family history of ischemic heart disease and other diseases of the circulatory system: Secondary | ICD-10-CM

## 2019-06-07 DIAGNOSIS — J9601 Acute respiratory failure with hypoxia: Secondary | ICD-10-CM

## 2019-06-07 DIAGNOSIS — Z9089 Acquired absence of other organs: Secondary | ICD-10-CM | POA: Diagnosis not present

## 2019-06-07 DIAGNOSIS — R0602 Shortness of breath: Secondary | ICD-10-CM

## 2019-06-07 DIAGNOSIS — Z808 Family history of malignant neoplasm of other organs or systems: Secondary | ICD-10-CM

## 2019-06-07 DIAGNOSIS — A4189 Other specified sepsis: Principal | ICD-10-CM | POA: Diagnosis present

## 2019-06-07 DIAGNOSIS — K2270 Barrett's esophagus without dysplasia: Secondary | ICD-10-CM | POA: Diagnosis not present

## 2019-06-07 DIAGNOSIS — I130 Hypertensive heart and chronic kidney disease with heart failure and stage 1 through stage 4 chronic kidney disease, or unspecified chronic kidney disease: Secondary | ICD-10-CM | POA: Diagnosis present

## 2019-06-07 DIAGNOSIS — K589 Irritable bowel syndrome without diarrhea: Secondary | ICD-10-CM | POA: Diagnosis present

## 2019-06-07 DIAGNOSIS — U071 COVID-19: Secondary | ICD-10-CM | POA: Diagnosis not present

## 2019-06-07 DIAGNOSIS — E039 Hypothyroidism, unspecified: Secondary | ICD-10-CM | POA: Diagnosis present

## 2019-06-07 DIAGNOSIS — Z86010 Personal history of colonic polyps: Secondary | ICD-10-CM

## 2019-06-07 DIAGNOSIS — Z8051 Family history of malignant neoplasm of kidney: Secondary | ICD-10-CM

## 2019-06-07 DIAGNOSIS — Z79890 Hormone replacement therapy (postmenopausal): Secondary | ICD-10-CM

## 2019-06-07 DIAGNOSIS — R6520 Severe sepsis without septic shock: Secondary | ICD-10-CM | POA: Diagnosis present

## 2019-06-07 DIAGNOSIS — J45909 Unspecified asthma, uncomplicated: Secondary | ICD-10-CM | POA: Diagnosis present

## 2019-06-07 DIAGNOSIS — E876 Hypokalemia: Secondary | ICD-10-CM | POA: Diagnosis present

## 2019-06-07 DIAGNOSIS — E7800 Pure hypercholesterolemia, unspecified: Secondary | ICD-10-CM | POA: Diagnosis not present

## 2019-06-07 DIAGNOSIS — I350 Nonrheumatic aortic (valve) stenosis: Secondary | ICD-10-CM | POA: Diagnosis not present

## 2019-06-07 DIAGNOSIS — Z825 Family history of asthma and other chronic lower respiratory diseases: Secondary | ICD-10-CM

## 2019-06-07 DIAGNOSIS — I5032 Chronic diastolic (congestive) heart failure: Secondary | ICD-10-CM | POA: Diagnosis present

## 2019-06-07 DIAGNOSIS — Z8619 Personal history of other infectious and parasitic diseases: Secondary | ICD-10-CM

## 2019-06-07 DIAGNOSIS — M1990 Unspecified osteoarthritis, unspecified site: Secondary | ICD-10-CM | POA: Diagnosis present

## 2019-06-07 DIAGNOSIS — Z9842 Cataract extraction status, left eye: Secondary | ICD-10-CM | POA: Diagnosis not present

## 2019-06-07 DIAGNOSIS — Z800 Family history of malignant neoplasm of digestive organs: Secondary | ICD-10-CM

## 2019-06-07 DIAGNOSIS — Z791 Long term (current) use of non-steroidal anti-inflammatories (NSAID): Secondary | ICD-10-CM

## 2019-06-07 DIAGNOSIS — Z7982 Long term (current) use of aspirin: Secondary | ICD-10-CM

## 2019-06-07 DIAGNOSIS — E782 Mixed hyperlipidemia: Secondary | ICD-10-CM | POA: Diagnosis not present

## 2019-06-07 DIAGNOSIS — Z807 Family history of other malignant neoplasms of lymphoid, hematopoietic and related tissues: Secondary | ICD-10-CM

## 2019-06-07 DIAGNOSIS — Z9841 Cataract extraction status, right eye: Secondary | ICD-10-CM | POA: Diagnosis not present

## 2019-06-07 DIAGNOSIS — I959 Hypotension, unspecified: Secondary | ICD-10-CM | POA: Diagnosis present

## 2019-06-07 DIAGNOSIS — E038 Other specified hypothyroidism: Secondary | ICD-10-CM | POA: Diagnosis not present

## 2019-06-07 DIAGNOSIS — R918 Other nonspecific abnormal finding of lung field: Secondary | ICD-10-CM | POA: Diagnosis not present

## 2019-06-07 DIAGNOSIS — Z8261 Family history of arthritis: Secondary | ICD-10-CM

## 2019-06-07 DIAGNOSIS — Z7951 Long term (current) use of inhaled steroids: Secondary | ICD-10-CM

## 2019-06-07 DIAGNOSIS — Z8679 Personal history of other diseases of the circulatory system: Secondary | ICD-10-CM

## 2019-06-07 LAB — CBC
HCT: 34.8 % — ABNORMAL LOW (ref 36.0–46.0)
Hemoglobin: 11.5 g/dL — ABNORMAL LOW (ref 12.0–15.0)
MCH: 29.9 pg (ref 26.0–34.0)
MCHC: 33 g/dL (ref 30.0–36.0)
MCV: 90.6 fL (ref 80.0–100.0)
Platelets: 188 10*3/uL (ref 150–400)
RBC: 3.84 MIL/uL — ABNORMAL LOW (ref 3.87–5.11)
RDW: 13.9 % (ref 11.5–15.5)
WBC: 6.3 10*3/uL (ref 4.0–10.5)
nRBC: 0 % (ref 0.0–0.2)

## 2019-06-07 LAB — LACTIC ACID, PLASMA
Lactic Acid, Venous: 1.1 mmol/L (ref 0.5–1.9)
Lactic Acid, Venous: 0.8 mmol/L (ref 0.5–1.9)

## 2019-06-07 LAB — COMPREHENSIVE METABOLIC PANEL
ALT: 25 U/L (ref 0–44)
AST: 54 U/L — ABNORMAL HIGH (ref 15–41)
Albumin: 2.8 g/dL — ABNORMAL LOW (ref 3.5–5.0)
Alkaline Phosphatase: 50 U/L (ref 38–126)
Anion gap: 13 (ref 5–15)
BUN: 23 mg/dL (ref 8–23)
CO2: 18 mmol/L — ABNORMAL LOW (ref 22–32)
Calcium: 7.7 mg/dL — ABNORMAL LOW (ref 8.9–10.3)
Chloride: 104 mmol/L (ref 98–111)
Creatinine, Ser: 1.34 mg/dL — ABNORMAL HIGH (ref 0.44–1.00)
GFR calc Af Amer: 41 mL/min — ABNORMAL LOW (ref >60)
GFR calc non Af Amer: 36 mL/min — ABNORMAL LOW (ref >60)
Glucose, Bld: 104 mg/dL — ABNORMAL HIGH (ref 70–99)
Potassium: 3.4 mmol/L — ABNORMAL LOW (ref 3.5–5.1)
Sodium: 135 mmol/L (ref 135–145)
Total Bilirubin: 0.5 mg/dL (ref 0.3–1.2)
Total Protein: 6.6 g/dL (ref 6.5–8.1)

## 2019-06-07 LAB — LIPID PANEL
Cholesterol: 95 mg/dL (ref 0–200)
HDL: 33 mg/dL — ABNORMAL LOW (ref >40)
LDL Cholesterol: 46 mg/dL (ref 0–99)
Total CHOL/HDL Ratio: 2.9 RATIO
Triglycerides: 78 mg/dL (ref <150)
VLDL: 16 mg/dL (ref 0–40)

## 2019-06-07 LAB — CREATININE, SERUM
Creatinine, Ser: 1.23 mg/dL — ABNORMAL HIGH (ref 0.44–1.00)
GFR calc Af Amer: 46 mL/min — ABNORMAL LOW (ref >60)
GFR calc non Af Amer: 40 mL/min — ABNORMAL LOW (ref >60)

## 2019-06-07 LAB — CBC WITH DIFFERENTIAL/PLATELET
Abs Immature Granulocytes: 0.12 10*3/uL — ABNORMAL HIGH (ref 0.00–0.07)
Basophils Absolute: 0 10*3/uL (ref 0.0–0.1)
Basophils Relative: 0 %
Eosinophils Absolute: 0 10*3/uL (ref 0.0–0.5)
Eosinophils Relative: 0 %
HCT: 35.5 % — ABNORMAL LOW (ref 36.0–46.0)
Hemoglobin: 12 g/dL (ref 12.0–15.0)
Immature Granulocytes: 2 %
Lymphocytes Relative: 10 %
Lymphs Abs: 0.6 10*3/uL — ABNORMAL LOW (ref 0.7–4.0)
MCH: 30.5 pg (ref 26.0–34.0)
MCHC: 33.8 g/dL (ref 30.0–36.0)
MCV: 90.3 fL (ref 80.0–100.0)
Monocytes Absolute: 0.4 10*3/uL (ref 0.1–1.0)
Monocytes Relative: 6 %
Neutro Abs: 4.8 10*3/uL (ref 1.7–7.7)
Neutrophils Relative %: 82 %
Platelets: UNDETERMINED 10*3/uL (ref 150–400)
RBC: 3.93 MIL/uL (ref 3.87–5.11)
RDW: 13.6 % (ref 11.5–15.5)
WBC: 5.9 10*3/uL (ref 4.0–10.5)
nRBC: 0 % (ref 0.0–0.2)

## 2019-06-07 LAB — LACTATE DEHYDROGENASE: LDH: 398 U/L — ABNORMAL HIGH (ref 98–192)

## 2019-06-07 LAB — PROCALCITONIN: Procalcitonin: 0.11 ng/mL

## 2019-06-07 LAB — TSH: TSH: 1.119 u[IU]/mL (ref 0.350–4.500)

## 2019-06-07 LAB — D-DIMER, QUANTITATIVE: D-Dimer, Quant: 3.29 ug/mL-FEU — ABNORMAL HIGH (ref 0.00–0.50)

## 2019-06-07 LAB — FERRITIN: Ferritin: 358 ng/mL — ABNORMAL HIGH (ref 11–307)

## 2019-06-07 LAB — BRAIN NATRIURETIC PEPTIDE: B Natriuretic Peptide: 87.3 pg/mL (ref 0.0–100.0)

## 2019-06-07 LAB — TROPONIN I (HIGH SENSITIVITY)
Troponin I (High Sensitivity): 11 ng/L (ref <18)
Troponin I (High Sensitivity): 10 ng/L (ref <18)

## 2019-06-07 LAB — FIBRINOGEN: Fibrinogen: 664 mg/dL — ABNORMAL HIGH (ref 210–475)

## 2019-06-07 LAB — C-REACTIVE PROTEIN: CRP: 18.8 mg/dL — ABNORMAL HIGH (ref <1.0)

## 2019-06-07 MED ORDER — ASPIRIN 81 MG PO CHEW
81.0000 mg | CHEWABLE_TABLET | Freq: Every day | ORAL | Status: DC
  Administered 2019-06-08 – 2019-06-11 (×4): 81 mg via ORAL
  Filled 2019-06-07 (×5): qty 1

## 2019-06-07 MED ORDER — ALPRAZOLAM 0.5 MG PO TABS
0.5000 mg | ORAL_TABLET | Freq: Every day | ORAL | Status: DC
  Administered 2019-06-08: 0.5 mg via ORAL
  Filled 2019-06-07: qty 2

## 2019-06-07 MED ORDER — ONDANSETRON HCL 4 MG/2ML IJ SOLN: 4.0000 mg | Freq: Four times a day (QID) | INTRAMUSCULAR | Status: DC | PRN

## 2019-06-07 MED ORDER — AMLODIPINE BESYLATE 5 MG PO TABS
5.0000 mg | ORAL_TABLET | Freq: Every day | ORAL | Status: DC
  Administered 2019-06-07: 5 mg via ORAL
  Filled 2019-06-07: qty 1

## 2019-06-07 MED ORDER — SODIUM CHLORIDE 0.9 % IV SOLN
100.0000 mg | INTRAVENOUS | Status: AC
  Administered 2019-06-08 – 2019-06-11 (×4): 100 mg via INTRAVENOUS
  Filled 2019-06-07 (×2): qty 100
  Filled 2019-06-07: qty 20
  Filled 2019-06-07 (×2): qty 100

## 2019-06-07 MED ORDER — ACETAMINOPHEN 325 MG PO TABS: 650.0000 mg | ORAL_TABLET | Freq: Four times a day (QID) | ORAL | Status: DC | PRN

## 2019-06-07 MED ORDER — ZINC SULFATE 220 (50 ZN) MG PO CAPS
220.0000 mg | ORAL_CAPSULE | Freq: Every day | ORAL | Status: DC
  Administered 2019-06-08 – 2019-06-11 (×5): 220 mg via ORAL
  Filled 2019-06-07 (×6): qty 1

## 2019-06-07 MED ORDER — ENOXAPARIN SODIUM 30 MG/0.3ML ~~LOC~~ SOLN
30.0000 mg | SUBCUTANEOUS | Status: DC
  Administered 2019-06-07: 30 mg via SUBCUTANEOUS
  Filled 2019-06-07: qty 0.3

## 2019-06-07 MED ORDER — METHYLPREDNISOLONE SODIUM SUCC 40 MG IJ SOLR
0.5000 mg/kg | Freq: Two times a day (BID) | INTRAMUSCULAR | Status: DC
  Administered 2019-06-07 – 2019-06-09 (×4): 31.6 mg via INTRAVENOUS
  Filled 2019-06-07 (×4): qty 1

## 2019-06-07 MED ORDER — VITAMIN C 500 MG PO TABS
500.0000 mg | ORAL_TABLET | Freq: Every day | ORAL | Status: DC
  Administered 2019-06-07 – 2019-06-11 (×5): 500 mg via ORAL
  Filled 2019-06-07 (×5): qty 1

## 2019-06-07 MED ORDER — ALPRAZOLAM 0.5 MG PO TABS
1.0000 mg | ORAL_TABLET | Freq: Every day | ORAL | Status: DC
  Administered 2019-06-07: 1 mg via ORAL
  Filled 2019-06-07: qty 4

## 2019-06-07 MED ORDER — SODIUM CHLORIDE 0.9 % IV SOLN
200.0000 mg | Freq: Once | INTRAVENOUS | Status: AC
  Administered 2019-06-07: 200 mg via INTRAVENOUS
  Filled 2019-06-07: qty 40

## 2019-06-07 MED ORDER — CLOPIDOGREL BISULFATE 75 MG PO TABS
75.0000 mg | ORAL_TABLET | Freq: Every day | ORAL | Status: DC
  Filled 2019-06-07: qty 1

## 2019-06-07 MED ORDER — ONDANSETRON HCL 4 MG PO TABS: 4.0000 mg | ORAL_TABLET | Freq: Four times a day (QID) | ORAL | Status: DC | PRN

## 2019-06-07 MED ORDER — POTASSIUM CHLORIDE IN NACL 20-0.9 MEQ/L-% IV SOLN
INTRAVENOUS | Status: DC
  Administered 2019-06-07: 22:00:00 via INTRAVENOUS
  Filled 2019-06-07 (×2): qty 1000

## 2019-06-07 MED ORDER — ROSUVASTATIN CALCIUM 20 MG PO TABS
40.0000 mg | ORAL_TABLET | Freq: Every day | ORAL | Status: DC
  Administered 2019-06-07 – 2019-06-11 (×5): 40 mg via ORAL
  Filled 2019-06-07 (×5): qty 2

## 2019-06-07 MED ORDER — LEVOTHYROXINE SODIUM 75 MCG PO TABS
75.0000 ug | ORAL_TABLET | Freq: Every day | ORAL | Status: DC
  Administered 2019-06-08 – 2019-06-11 (×4): 75 ug via ORAL
  Filled 2019-06-07 (×4): qty 1

## 2019-06-07 NOTE — ED Triage Notes (Signed)
Pt coming in POV. Pt known Coivd-19 positive coming in today with worsening shortness of breath.

## 2019-06-07 NOTE — H&P (Signed)
History and Physical    Brenda [name] [id number] DOB: 1930-12-13 DOA: 06/07/2019  PCP: [name], MD  Patient coming from: Home I have personally briefly reviewed patient's old medical records in [place]  Chief Complaint: Worsening shortness of breath and fever.  Recently tested positive for COVID-19 5 days ago.  HPI: Brenda [name] is a 85 y.o. female with medical history significant of severe aortic stenosis status post TAVR in 11/2018, hypertension, hyperlipidemia, hypothyroidism, anxiety, chronic diastolic congestive heart failure with preserved ejection fraction of 60 to 65% presents to emergency department due to fever, shortness of breath and generalized weakness.  Patient reports that she was tested positive for COVID-19 about 5 days ago after she had a previously negative test.  Reports that her symptoms are getting worse, she had fever of 102, she feels weak, lethargic and tired, has decreased appetite and worsening shortness of breath.    She denies cough, congestion, wheezing, chest pain, N/V/D, leg swelling, palpitation, urinary or bowel changes.  Patient reports that she likely got the COVID-19 infection from her husband and sister who passed away this morning due to COVID-19 and she is concerned about it.  She lives with her husband at home and denies smoking, alcohol, illicit drug use.  ED Course: Upon arrival: Patient was tachycardic, tachypneic, hypoxic and 90s-patient placed on 4 L of oxygen via nasal cannula.  X-ray shows groundglass opacities throughout both lungs suggestive of atypical/viral pneumonia.  Review of Systems: As per HPI otherwise negative.    Past Medical History:  Diagnosis Date   Anxiety    Asthma    Barrett's esophagus    Cancer (HCC)    DJD (degenerative joint disease)    GERD (gastroesophageal reflux disease)    Goiter    Hemorrhoids    History of colonoscopy    History of shingles    face/eye   History of  subdural hematoma    History of traumatic subdural hematoma    Hx of colonic polyps    Hyperlipidemia    Hypertension    IBS (irritable bowel syndrome)    Osteoarthritis    S/P TAVR (transcatheter aortic valve replacement)    23 mm Edwards Sapien 3 transcatheter heart valve placed via percutaneous right transfemoral approach    Sarcoidosis    [facility] ([place])     Past Surgical History:  Procedure Laterality Date   BREAST LUMPECTOMY  1974   left   CATARACT EXTRACTION W/ INTRAOCULAR LENS  IMPLANT, BILATERAL     CRANIOTOMY Right 01/04/2014   Procedure: CRANIOTOMY HEMATOMA EVACUATION SUBDURAL;  Surgeon: [name], MD;  Location: [facility];  Service: Neurosurgery;  Laterality: Right;   CRANIOTOMY N/A 01/08/2014   Procedure: Redo CRANIOTOMY HEMATOMA EVACUATION SUBDURAL RIGHT;  Surgeon: [name], MD;  Location: [facility];  Service: Neurosurgery;  Laterality: N/A;  Redo CRANIOTOMY HEMATOMA EVACUATION SUBDURAL RIGHT   CRANIOTOMY Right 01/10/2014   Procedure: Redo CRANIOTOMY HEMATOMA EVACUATION SUBDURAL;  Surgeon: [name], MD;  Location: [facility];  Service: Neurosurgery;  Laterality: Right;   DILATION AND CURETTAGE OF UTERUS     EYE SURGERY     NASAL SINUS SURGERY     NECK SURGERY     x 2   RIGHT/LEFT HEART CATH AND CORONARY ANGIOGRAPHY N/A 09/27/2018   Procedure: RIGHT/LEFT HEART CATH AND CORONARY ANGIOGRAPHY;  Surgeon: Burnell Blanks, MD;  Location: [facility];  Service: Cardiovascular;  Laterality: N/A;  TEE WITHOUT CARDIOVERSION N/A 11/28/2018   Procedure: TRANSESOPHAGEAL ECHOCARDIOGRAM (TEE);  Surgeon: Sherren Mocha, MD;  Location: [facility];  Service: Open Heart Surgery;  Laterality: N/A;   TONSILLECTOMY AND ADENOIDECTOMY  1976   TOTAL THYROIDECTOMY  06-05-08   TRANSCATHETER AORTIC VALVE REPLACEMENT, TRANSFEMORAL N/A 11/28/2018   Procedure: TRANSCATHETER AORTIC VALVE REPLACEMENT, TRANSFEMORAL;  Surgeon: Sherren Mocha, MD;   Location: [facility];  Service: Open Heart Surgery;  Laterality: N/A;     reports that she has never smoked. She has never used smokeless tobacco. She reports current alcohol use. She reports that she does not use drugs.  No Known Allergies  Family History  Problem Relation Age of Onset   Hypertension Father    Hyperlipidemia Father    Rheum arthritis Father    Heart failure Father    Heart disease Father    Hypertension Mother    Hyperlipidemia Mother    Kidney cancer Mother    Lymphoma Mother    Hypertension Brother    Hypertension Sister    Asthma Sister    Thyroid cancer Sister    Colon cancer Sister     Prior to Admission medications   Medication Sig Start Date End Date Taking? Authorizing Provider  acetaminophen (TYLENOL) 650 MG CR tablet Take 650 mg by mouth daily.    Yes [provider]  ALPRAZolam Duanne Moron) 0.5 MG tablet Take 0.5 mg by mouth See admin instructions. Take 0.5 mg in the morning and 1 mg at night 03/06/14  Yes [provider]  amLODipine (NORVASC) 5 MG tablet Take 1 tablet (5 mg total) by mouth daily. 12/28/18 12/23/19 Yes Eileen Stanford, PA-C  aspirin 81 MG chewable tablet Chew 1 tablet (81 mg total) by mouth daily. 11/29/18  Yes Eileen Stanford, PA-C  Biotin 5000 MCG TABS Take 5,000 mcg by mouth at bedtime.    Yes [provider]  Cholecalciferol (VITAMIN D) 125 MCG (5000 UT) CAPS Take 5,000 Units by mouth at bedtime.    Yes [provider]  clopidogrel (PLAVIX) 75 MG tablet Take 1 tablet (75 mg total) by mouth daily with breakfast. 11/30/18  Yes Eileen Stanford, PA-C  cycloSPORINE (RESTASIS) 0.05 % ophthalmic emulsion Place 1 drop into both eyes every 12 (twelve) hours.    Yes [provider]  diclofenac sodium (VOLTAREN) 1 % GEL Apply 2 g topically daily. To affected joint. 09/08/18  Yes Hulan Saas M, DO  diphenhydramine-acetaminophen (TYLENOL PM) 25-500 MG TABS tablet Take 1 tablet by mouth at  bedtime.   Yes [provider]  Fluticasone Propionate (FLONASE ALLERGY RELIEF NA) Place 1 spray into the nose daily.    Yes [provider]  GLUCOSAMINE-CHONDROITIN PO Take 1 capsule by mouth 3 (three) times daily.   Yes [provider]  levothyroxine (SYNTHROID, LEVOTHROID) 75 MCG tablet Take 75 mcg by mouth daily.    Yes [provider]  Liniments (SALONPAS PAIN RELIEF PATCH EX) Apply 1 patch topically daily.   Yes [provider]  Menthol, Topical Analgesic, (BLUE-EMU MAXIMUM STRENGTH EX) Apply 1 application topically daily as needed (knee pain).   Yes [provider]  Multiple Vitamins-Minerals (MULTIVITAMIN ADULT) CHEW Chew 2 each by mouth daily.   Yes [provider]  rosuvastatin (CRESTOR) 40 MG tablet TAKE 1 TABLET (40 MG TOTAL) BY MOUTH DAILY. Patient taking differently: Take 40 mg by mouth daily.  10/18/18  Yes Fay Records, MD  senna-docusate (SENNA S) 8.6-50 MG tablet Take 3  tablets by mouth at bedtime.    Yes [provider]  spironolactone (ALDACTONE) 25 MG tablet Take 25 mg by mouth daily.   Yes [provider]  vitamin B-12 (CYANOCOBALAMIN) 1000 MCG tablet Take 1,000 mcg by mouth at bedtime.   Yes [provider]    Physical Exam: Vitals:   06/07/19 1815 06/07/19 1830 06/07/19 1845 06/07/19 1900  BP: (!) 112/55 (!) 111/53 (!) 110/52 (!) 114/58  Pulse: (!) 48 (!) 116 (!) 117 (!) 49  Resp: (!) 30 (!) 27 (!) 28 (!) 22  Temp:      TempSrc:      SpO2: 92% 90% 91% 91%  Weight:      Height:        Constitutional: NAD, calm, comfortable, on 4 L of oxygen via nasal cannula.  Appears lethargic and weak. Eyes: PERRL, lids and conjunctivae normal ENMT: Mucous membranes are moist. Posterior pharynx clear of any exudate or lesions.Normal dentition.  Neck: normal, supple, no masses, no thyromegaly Respiratory: Patient has crackles bilaterally, no wheezing, Normal respiratory effort. No accessory  muscle use.  Cardiovascular: Regular rate and rhythm, no murmurs / rubs / gallops. No extremity edema. 2+ pedal pulses. No carotid bruits.  Abdomen: no tenderness, no masses palpated. No hepatosplenomegaly. Bowel sounds positive.  Musculoskeletal: no clubbing / cyanosis. No joint deformity upper and lower extremities. Good ROM, no contractures. Normal muscle tone.  Skin: no rashes, lesions, ulcers. No induration Neurologic: CN 2-12 grossly intact. Sensation intact, DTR normal. Strength 5/5 in all 4.  Psychiatric: Normal judgment and insight. Alert and oriented x 3. Normal mood.    Labs on Admission: I have personally reviewed following labs and imaging studies  CBC: Recent Labs  Lab 06/07/19 1658  WBC 5.9  NEUTROABS 4.8  HGB 12.0  HCT 35.5*  MCV 90.3  PLT PLATELET CLUMPS NOTED ON SMEAR, UNABLE TO ESTIMATE   Basic Metabolic Panel: Recent Labs  Lab 06/07/19 1658  NA 135  K 3.4*  CL 104  CO2 18*  GLUCOSE 104*  BUN 23  CREATININE 1.34*  CALCIUM 7.7*   GFR: Estimated Creatinine Clearance: 27 mL/min (A) (by C-G formula based on SCr of 1.34 mg/dL (H)). Liver Function Tests: Recent Labs  Lab 06/07/19 1658  AST 54*  ALT 25  ALKPHOS 50  BILITOT 0.5  PROT 6.6  ALBUMIN 2.8*   No results for input(s): LIPASE, AMYLASE in the last 168 hours. No results for input(s): AMMONIA in the last 168 hours. Coagulation Profile: No results for input(s): INR, PROTIME in the last 168 hours. Cardiac Enzymes: No results for input(s): CKTOTAL, CKMB, CKMBINDEX, TROPONINI in the last 168 hours. BNP (last 3 results) No results for input(s): PROBNP in the last 8760 hours. HbA1C: No results for input(s): HGBA1C in the last 72 hours. CBG: No results for input(s): GLUCAP in the last 168 hours. Lipid Profile: No results for input(s): CHOL, HDL, LDLCALC, TRIG, CHOLHDL, LDLDIRECT in the last 72 hours. Thyroid Function Tests: Recent Labs    06/07/19 1718  TSH 1.119   Anemia Panel: No  results for input(s): VITAMINB12, FOLATE, FERRITIN, TIBC, IRON, RETICCTPCT in the last 72 hours. Urine analysis:    Component Value Date/Time   COLORURINE YELLOW 11/23/2018 1054   APPEARANCEUR CLEAR 11/23/2018 1054   LABSPEC 1.011 11/23/2018 1054   PHURINE 5.0 11/23/2018 1054   GLUCOSEU NEGATIVE 11/23/2018 1054   HGBUR SMALL (A) 11/23/2018 1054   [place] 11/23/2018 1054   [place] 11/23/2018 1054  PROTEINUR NEGATIVE 11/23/2018 1054   UROBILINOGEN 1.0 01/16/2014 1036   NITRITE NEGATIVE 11/23/2018 1054   LEUKOCYTESUR NEGATIVE 11/23/2018 1054    Radiological Exams on Admission: Dg Chest Portable 1 View  Result Date: 06/07/2019 CLINICAL DATA:  COVID-19 positive, hypoxia EXAM: PORTABLE CHEST 1 VIEW COMPARISON:  11/28/2018 FINDINGS: The heart size and mediastinal contours are stable. Postsurgical changes of TAVR. Calcific aortic knob. There are mild diffuse ground-glass opacities throughout both lungs in a predominantly peripheral distribution. Chronic left 4 and 5 rib fractures. IMPRESSION: Diffuse ground-glass opacities throughout both lungs in a predominantly peripheral distribution. Findings suggestive of atypical/viral pneumonia given the patient's history. Electronically Signed   By: Davina Poke M.D.   On: 06/07/2019 17:38    EKG: Sinus rhythm, atrial premature complexes noted, no ST elevation or depression.  Assessment/Plan Principal Problem:   Acute respiratory failure with hypoxia (HCC) Active Problems:   Hyperlipidemia   Anxiety   Hypertension   Hypothyroidism   Hypokalemia   Severe aortic stenosis   S/P TAVR (transcatheter aortic valve replacement)   Pneumonia due to COVID-19 virus   Acute on chronic kidney failure (HCC)   Chronic diastolic CHF (congestive heart failure) (HCC)   Acute hypoxic respiratory failure: -Secondary from underlying Covid pneumonia.  Patient tested positive for COVID-19, 5 days ago. -She presented with tachycardia,  tachypnea and hypoxia-currently on 4 L of oxygen via nasal cannula. -We will admit patient at [facility] -On continuous pulse ox.  Reviewed chest x-ray. -Ordered inflammatory markers.  Blood culture is obtained and is pending.  Patient is afebrile with no leukocytosis, lactic acid: WNL. -Start on IV steroids and consulted pharmacy for remdesivir -Patient was told that if COVID-19 pneumonitis gets worse we might potentially use Actemra off label, she denies any known history of tuberculosis or hepatitis, understands the risk and benefits and wants to proceed with Actemra treatment if required. -Ordered inflammatory markers for tomorrow morning.  AKI on CKD stage III: -Likely secondary to dehydration. -Continue IV fluids and monitor BMP closely  Hypokalemia: -Replaced.  Repeat BMP tomorrow a.m.  Hypertension: Well-controlled -Continue amlodipine, hold Aldactone due to AKI  Hyperlipidemia: Check lipid panel -Continue statin  Hypothyroidism: Check TSH -Continue levothyroxine  Anxiety: Stable -Continue Xanax  Severe aortic stenosis s/p TAVR: -Continue aspirin, Plavix.  Chronic diastolic congestive heart failure: -No signs of acute exacerbation. -Continue aspirin, statin, hold Aldactone for now due to AKI  DVT prophylaxis: TED/SCD Code Status: Full code-confirmed with the patient Family Communication: None present at bedside.  Plan of care discussed with patient in length and she verbalized understanding and agreed with it. Disposition Plan: TBD Consults called: None  Admission status: Inpatient at [facility] hypokalemia   Mckinley Jewel MD [facility] Pager [phone]  If 7PM-7AM, please contact night-coverage [email or web address] Password [facility]  06/07/2019, 7:33 PM

## 2019-06-07 NOTE — ED Notes (Signed)
Tarry Kos [phone] daughter looking for an update on patient

## 2019-06-07 NOTE — Telephone Encounter (Signed)
Patients daughter is calling in regards to the patient being hospitalized today for a low oxygen level and wanted to inform Dr. Harrington Challenger.

## 2019-06-07 NOTE — Progress Notes (Signed)
Pharmacy Note - Remdesivir Dosing  O:  ALT: 25 CXR: Diffuse ground-glass opacities throughout both lungs in a predominantly peripheral distribution. Findings suggestive of atypical/viral pneumonia given the patient's history. Requiring supplemental O2: 4L [place]   A: 85 yo female presented on 06/07/2019 with worsening SOB and fever after testing positive for COVID-19 5 days prior per MD note. Patient meets criteria for remdesivir.   P: Begin remdesivir 200 mg IV x 1, followed by 100 mg IV daily x 4 days  Monitor ALT, clinical progress  Cristela Felt, PharmD PGY1 Pharmacy Resident [employer]: [phone]  06/07/2019 7:37 PM

## 2019-06-07 NOTE — Telephone Encounter (Signed)
Contacted daughter   Pt's fever may be getting a little better  Still very fatigued Agreed with recommendations made by Ronnie Derby Getting pulse oxy Stay hydreated    If sats drop then should go to hosp  Note: younger sister who was initial covid positive died today  74 years younger

## 2019-06-07 NOTE — Telephone Encounter (Signed)
pts daughter called back.  Left pt at the ER and is worried that she wont receive updates because she is not POA.  She is listed on DPR, however.  Daughter requesting any updates from Dr. Harrington Challenger if she is able to obtain any updates on patient.

## 2019-06-07 NOTE — Telephone Encounter (Signed)
Driving pt to [facility] ER at this time.  Pt's pulse ox is 83-84 percent on room air, no O2 at home..  Pulse ox accurate on daughter's finger.  Pts hands are warm to touch.  Pt is alert and feels okay according to daughter. Adv to inform when they drive up that she is COVID +, to determine if she should be at [facility] or [facility].

## 2019-06-07 NOTE — ED Provider Notes (Signed)
[place] EMERGENCY DEPARTMENT Provider Note   CSN: [id number] Arrival date & time: 06/07/19  1614     History   Chief Complaint Chief Complaint  Patient presents with   Shortness of Breath    HPI Brenda [name] is a 85 y.o. female.     The history is provided by the patient and medical records. No language interpreter was used.  Shortness of Breath Severity:  Severe Onset quality:  Gradual Duration:  3 days Timing:  Constant Progression:  Worsening Chronicity:  New Context: URI   Relieved by:  Nothing Worsened by:  Nothing Ineffective treatments:  None tried Associated symptoms: cough (improving) and fever   Associated symptoms: no abdominal pain, no chest pain, no diaphoresis, no headaches, no sputum production, no vomiting and no wheezing     Past Medical History:  Diagnosis Date   Anxiety    Asthma    Barrett's esophagus    Cancer (HCC)    DJD (degenerative joint disease)    GERD (gastroesophageal reflux disease)    Goiter    Hemorrhoids    History of colonoscopy    History of shingles    face/eye   History of subdural hematoma    History of traumatic subdural hematoma    Hx of colonic polyps    Hyperlipidemia    Hypertension    IBS (irritable bowel syndrome)    Osteoarthritis    S/P TAVR (transcatheter aortic valve replacement)    23 mm Edwards Sapien 3 transcatheter heart valve placed via percutaneous right transfemoral approach    Sarcoidosis    [facility])     Patient Active Problem List   Diagnosis Date Noted   History of traumatic subdural hematoma    S/P TAVR (transcatheter aortic valve replacement)    Severe aortic stenosis    Neck arthritis 09/08/2018   Multiple fractures of ribs of left side 12/24/2013   Urinary retention 12/24/2013   CAD (coronary artery disease) 04/20/2013   Hypothyroidism 07/09/2010   CONSTIPATION 07/09/2010   HEMATEMESIS 08/14/2008   Sarcoidosis 08/09/2008     Hyperlipidemia 08/09/2008   ANXIETY 08/09/2008   HEMORRHOIDS 08/09/2008   GERD 08/09/2008   BARRETTS ESOPHAGUS 08/09/2008   Irritable bowel syndrome 08/09/2008   DEGENERATIVE JOINT DISEASE 08/09/2008   COLONIC POLYPS, ADENOMATOUS, HX OF [id number]   HELICOBACTER PYLORI GASTRITIS, HX OF 08/09/2008    Past Surgical History:  Procedure Laterality Date   BREAST LUMPECTOMY  1974   left   CATARACT EXTRACTION W/ INTRAOCULAR LENS  IMPLANT, BILATERAL     CRANIOTOMY Right 01/04/2014   Procedure: CRANIOTOMY HEMATOMA EVACUATION SUBDURAL;  Surgeon: [name], MD;  Location: [facility];  Service: Neurosurgery;  Laterality: Right;   CRANIOTOMY N/A 01/08/2014   Procedure: Redo CRANIOTOMY HEMATOMA EVACUATION SUBDURAL RIGHT;  Surgeon: [name], MD;  Location: [place] [facility];  Service: Neurosurgery;  Laterality: N/A;  Redo CRANIOTOMY HEMATOMA EVACUATION SUBDURAL RIGHT   CRANIOTOMY Right 01/10/2014   Procedure: Redo CRANIOTOMY HEMATOMA EVACUATION SUBDURAL;  Surgeon: [name], MD;  Location: [place] [facility];  Service: Neurosurgery;  Laterality: Right;   DILATION AND CURETTAGE OF UTERUS     EYE SURGERY     NASAL SINUS SURGERY     NECK SURGERY     x 2   RIGHT/LEFT HEART CATH AND CORONARY ANGIOGRAPHY N/A 09/27/2018   Procedure: RIGHT/LEFT HEART CATH AND CORONARY ANGIOGRAPHY;  Surgeon: Burnell Blanks, MD;  Location: [place] CV [facility];  Service: Cardiovascular;  Laterality: N/A;   TEE WITHOUT CARDIOVERSION N/A 11/28/2018   Procedure: TRANSESOPHAGEAL ECHOCARDIOGRAM (TEE);  Surgeon: Sherren Mocha, MD;  Location: [place];  Service: Open Heart Surgery;  Laterality: N/A;   TONSILLECTOMY AND ADENOIDECTOMY  1976   TOTAL THYROIDECTOMY  06-05-08   TRANSCATHETER AORTIC VALVE REPLACEMENT, TRANSFEMORAL N/A 11/28/2018   Procedure: TRANSCATHETER AORTIC VALVE REPLACEMENT, TRANSFEMORAL;  Surgeon: Sherren Mocha, MD;  Location: [place];  Service: Open Heart Surgery;  Laterality:  N/A;     OB History   No obstetric history on file.      Home Medications    Prior to Admission medications   Medication Sig Start Date End Date Taking? Authorizing Provider  acetaminophen (TYLENOL) 650 MG CR tablet Take 650 mg by mouth daily.     [provider]  ALPRAZolam Duanne Moron) 0.5 MG tablet Take 0.5 mg by mouth See admin instructions. Take 0.5 mg in the morning and 1 mg at night 03/06/14   [provider]  amLODipine (NORVASC) 5 MG tablet Take 1 tablet (5 mg total) by mouth daily. 12/28/18 12/23/19  Eileen Stanford, PA-C  amoxicillin (AMOXIL) 500 MG tablet Take 4 tablets (2,000 mg total) by mouth as directed. 1 hour prior to dental work including cleanings 12/05/18   Eileen Stanford, PA-C  aspirin 81 MG chewable tablet Chew 1 tablet (81 mg total) by mouth daily. 11/29/18   Eileen Stanford, PA-C  Biotin 5000 MCG TABS Take 5,000 mcg by mouth at bedtime.     [provider]  Cholecalciferol (VITAMIN D) 125 MCG (5000 UT) CAPS Take 5,000 Units by mouth at bedtime.     [provider]  clopidogrel (PLAVIX) 75 MG tablet Take 1 tablet (75 mg total) by mouth daily with breakfast. 11/30/18   Eileen Stanford, PA-C  cycloSPORINE (RESTASIS) 0.05 % ophthalmic emulsion Place 1 drop into both eyes every 12 (twelve) hours.     [provider]  diclofenac sodium (VOLTAREN) 1 % GEL Apply 2 g topically daily. To affected joint. 09/08/18   Lyndal Pulley, DO  diphenhydramine-acetaminophen (TYLENOL PM) 25-500 MG TABS tablet Take 1 tablet by mouth at bedtime.    [provider]  Fluticasone Propionate (FLONASE ALLERGY RELIEF NA) Place into the nose daily.    [provider]  GLUCOSAMINE-CHONDROITIN PO Take 1 capsule by mouth 3 (three) times daily.    [provider]  levothyroxine (SYNTHROID, LEVOTHROID) 75 MCG tablet Take 75 mcg by mouth every other day. Alternate 75 mcg one day and 88 mcg the next    [provider]   Liniments (SALONPAS PAIN RELIEF PATCH EX) Apply 1 patch topically daily.    [provider]  Menthol, Topical Analgesic, (BLUE-EMU MAXIMUM STRENGTH EX) Apply 1 application topically daily as needed (knee pain).    [provider]  Multiple Vitamins-Minerals (MULTIVITAMIN ADULT) CHEW Chew 2 each by mouth daily.    [provider]  rosuvastatin (CRESTOR) 40 MG tablet TAKE 1 TABLET (40 MG TOTAL) BY MOUTH DAILY. 10/18/18   Fay Records, MD  senna-docusate (SENNA S) 8.6-50 MG tablet Take 3 tablets by mouth at bedtime.     [provider]  spironolactone (ALDACTONE) 25 MG tablet Take 25 mg by mouth daily.    [provider]  vitamin B-12 (CYANOCOBALAMIN) 1000 MCG tablet Take 1,000 mcg by mouth at bedtime.    [provider]    Family History Family History  Problem Relation Age of  Onset   Hypertension Father    Hyperlipidemia Father    Rheum arthritis Father    Heart failure Father    Heart disease Father    Hypertension Mother    Hyperlipidemia Mother    Kidney cancer Mother    Lymphoma Mother    Hypertension Brother    Hypertension Sister    Asthma Sister    Thyroid cancer Sister    Colon cancer Sister     Social History Social History   Tobacco Use   Smoking status: Never Smoker   Smokeless tobacco: Never Used  Substance Use Topics   Alcohol use: Yes    Comment: Occ glass of wine with dinner   Drug use: No     Allergies   Patient has no known allergies.   Review of Systems Review of Systems  Constitutional: Positive for chills, fatigue and fever. Negative for diaphoresis.  HENT: Negative for congestion.   Respiratory: Positive for cough (improving), chest tightness and shortness of breath. Negative for sputum production, wheezing and stridor.   Cardiovascular: Negative for chest pain, palpitations and leg swelling.  Gastrointestinal: Negative for abdominal pain, constipation, diarrhea, nausea and  vomiting.  Genitourinary: Negative for dysuria.  Musculoskeletal: Negative for back pain.  Skin: Negative for wound.  Neurological: Negative for headaches.  Psychiatric/Behavioral: Negative for agitation.  All other systems reviewed and are negative.    Physical Exam Updated Vital Signs BP (!) 106/56 (BP Location: Right Arm)    Pulse 67    Temp 98.1 F (36.7 C) (Oral)    Resp (!) 23    Ht 5\' 3"  (1.6 m)    Wt 63.5 kg    SpO2 95%    BMI 24.80 kg/m   Physical Exam Vitals signs and nursing note reviewed.  Constitutional:      General: She is not in acute distress.    Appearance: She is well-developed. She is not ill-appearing, toxic-appearing or diaphoretic.  HENT:     Head: Normocephalic and atraumatic.     Mouth/Throat:     Mouth: Mucous membranes are moist.     Pharynx: No pharyngeal swelling or oropharyngeal exudate.  Eyes:     Extraocular Movements: Extraocular movements intact.     Pupils: Pupils are equal, round, and reactive to light.  Neck:     Musculoskeletal: Normal range of motion.  Cardiovascular:     Rate and Rhythm: Normal rate and regular rhythm.     Heart sounds: Murmur present.  Pulmonary:     Effort: Pulmonary effort is normal. Tachypnea present.     Breath sounds: Rhonchi present.  Chest:     Chest wall: No tenderness.  Musculoskeletal:     Right lower leg: She exhibits no tenderness. No edema.     Left lower leg: She exhibits no tenderness. No edema.  Skin:    Capillary Refill: Capillary refill takes less than 2 seconds.  Neurological:     Mental Status: She is alert.  Psychiatric:        Mood and Affect: Mood normal.      ED Treatments / Results  Labs (all labs ordered are listed, but only abnormal results are displayed) Labs Reviewed  CBC WITH DIFFERENTIAL/PLATELET - Abnormal; Notable for the following components:      Result Value   HCT 35.5 (*)    Lymphs Abs 0.6 (*)    Abs Immature Granulocytes 0.12 (*)    All other components within  normal limits  COMPREHENSIVE METABOLIC  PANEL - Abnormal; Notable for the following components:   Potassium 3.4 (*)    CO2 18 (*)    Glucose, Bld 104 (*)    Creatinine, Ser 1.34 (*)    Calcium 7.7 (*)    Albumin 2.8 (*)    AST 54 (*)    GFR calc non Af Amer 36 (*)    GFR calc Af Amer 41 (*)    All other components within normal limits  CBC - Abnormal; Notable for the following components:   RBC 3.84 (*)    Hemoglobin 11.5 (*)    HCT 34.8 (*)    All other components within normal limits  CREATININE, SERUM - Abnormal; Notable for the following components:   Creatinine, Ser 1.23 (*)    GFR calc non Af Amer 40 (*)    GFR calc Af Amer 46 (*)    All other components within normal limits  C-REACTIVE PROTEIN - Abnormal; Notable for the following components:   CRP 18.8 (*)    All other components within normal limits  D-DIMER, QUANTITATIVE (NOT AT [facility]) - Abnormal; Notable for the following components:   D-Dimer, Quant 3.29 (*)    All other components within normal limits  FERRITIN - Abnormal; Notable for the following components:   Ferritin 358 (*)    All other components within normal limits  FIBRINOGEN - Abnormal; Notable for the following components:   Fibrinogen 664 (*)    All other components within normal limits  LACTATE DEHYDROGENASE - Abnormal; Notable for the following components:   LDH 398 (*)    All other components within normal limits  LIPID PANEL - Abnormal; Notable for the following components:   HDL 33 (*)    All other components within normal limits  CULTURE, BLOOD (ROUTINE X 2)  CULTURE, BLOOD (ROUTINE X 2)  LACTIC ACID, PLASMA  LACTIC ACID, PLASMA  TSH  BRAIN NATRIURETIC PEPTIDE  PROCALCITONIN  HEPATITIS B SURFACE ANTIGEN  CBC WITH DIFFERENTIAL/PLATELET  COMPREHENSIVE METABOLIC PANEL  C-REACTIVE PROTEIN  D-DIMER, QUANTITATIVE (NOT AT [facility])  FERRITIN  MAGNESIUM  PHOSPHORUS  TROPONIN I (HIGH SENSITIVITY)  TROPONIN I (HIGH SENSITIVITY)     EKG None  ED ECG REPORT   Date: 06/07/2019  Rate: 70  Rhythm: normal sinus rhythm  QRS Axis: normal  Intervals: normal  ST/T Wave abnormalities: nonspecific T wave changes  Conduction Disutrbances:none  Narrative Interpretation:   Old EKG Reviewed: changes noted new t wave inverisonin lead AVF. No STEMI  I have personally reviewed the EKG tracing and agree with the computerized printout as noted.   Radiology Dg Chest Portable 1 View  Result Date: 06/07/2019 CLINICAL DATA:  COVID-19 positive, hypoxia EXAM: PORTABLE CHEST 1 VIEW COMPARISON:  11/28/2018 FINDINGS: The heart size and mediastinal contours are stable. Postsurgical changes of TAVR. Calcific aortic knob. There are mild diffuse ground-glass opacities throughout both lungs in a predominantly peripheral distribution. Chronic left 4 and 5 rib fractures. IMPRESSION: Diffuse ground-glass opacities throughout both lungs in a predominantly peripheral distribution. Findings suggestive of atypical/viral pneumonia given the patient's history. Electronically Signed   By: Davina Poke M.D.   On: 06/07/2019 17:38    Procedures Procedures (including critical care time)  Brenda [name] was evaluated in Emergency Department on 06/07/2019 for the symptoms described in the history of present illness. She was evaluated in the context of the global COVID-19 pandemic, which necessitated consideration that the patient might be at risk for infection with the SARS-CoV-2 virus that  causes COVID-19. Institutional protocols and algorithms that pertain to the evaluation of patients at risk for COVID-19 are in a state of rapid change based on information released by regulatory bodies including the CDC and federal and state organizations. These policies and algorithms were followed during the patient's care in the ED.    Medications Ordered in ED Medications - No data to display   Initial Impression / Assessment and Plan / ED Course  I have  reviewed the triage vital signs and the nursing notes.  Pertinent labs & imaging results that were available during my care of the patient were reviewed by me and considered in my medical decision making (see chart for details).        Brenda [name] is a very pleasant 85 y.o. female with a past medical history significant for hypertension, hyperlipidemia, sarcoidosis, hypothyroidism, CAD, prior aortic valve replacement, prior traumatic subdural hematomas, asthma and active covid 19 infection who presents for hypoxia and worsening shortness of breath.  Patient reports that she was diagnosed with COVID-19 last week after she had a previously negative test.  She reports of last several days her shortness of breath has worsened and she had a temperature of over 102 today.  She reports that she was having felt short of breath she came to the emergency department and was found to be hypoxic in the waiting room.  Patient was placed on 4 L nasal cannula to maintain oxygen saturations in the 90s.  She does not take oxygen at home normally.  She denies any chest pain or palpitations, nausea, vomiting, urinary symptoms or GI symptoms.  She is simply having the chest tightness and shortness of breath needing oxygen.  Of note, she does report that she likely got the COVID-19 infection from her husband sister who passed away today from the infection.  Patient is concerned about this.  On exam, patient has rhonchi in her lungs bilaterally.  No significant wheezing.  Abdomen and chest are nontender.  Good pulses in extremities.  Patient does have a murmur.  Exam otherwise unremarkable.  Patient will have x-ray, labs, and will write on oxygen.  After work-up is completed, she will be admitted for new hypoxia.  Will look for pneumonia as well.  Anticipate admission for new oxygen requirement with COVID-19 infection.  6:44 PM Patient has no leukocytosis and normal lactic acid.  BNP is not elevated.  Troponin normal  initially.  Metabolic panel shows elevated creatinine slightly but no kidney injury seen.  Patient is having some hypocalcemia.  X-ray does show diffuse opacities but suspect viral infection.  Clinically suspect patient has worsening Covid infection requiring admission.  Will call admitting team.  Will defer antibiotics to them as she is afebrile and does not have elevated white count or lactic acid.  Suspect viral infection primarily.     Final Clinical Impressions(s) / ED Diagnoses   Final diagnoses:  SOB (shortness of breath)  Hypoxia  COVID-19    ED Discharge Orders    None     Clinical Impression: 1. SOB (shortness of breath)   2. Hypoxia   3. COVID-19     Disposition: Admit  This note was prepared with assistance of Dragon voice recognition software. Occasional wrong-word or sound-a-like substitutions may have occurred due to the inherent limitations of voice recognition software.     Dionysios Massman, Gwenyth Allegra, MD 06/07/19 [phone]

## 2019-06-07 NOTE — Telephone Encounter (Addendum)
Spoke with patient's daughter.  Tested positive for COVID at PCP. On about day 12 they think. Fever daily, tylenol helpful. BP staying pretty good, making sure to stay hydrated. No SOB, dizziness or CP. Extreme fatigue. Will obtain pulse ox and monitor.  Will contact PCP for readings that go/stay below 92%. Taking vit D, zinc and Vit C.

## 2019-06-08 DIAGNOSIS — R0902 Hypoxemia: Secondary | ICD-10-CM

## 2019-06-08 DIAGNOSIS — I959 Hypotension, unspecified: Secondary | ICD-10-CM | POA: Diagnosis present

## 2019-06-08 DIAGNOSIS — A419 Sepsis, unspecified organism: Secondary | ICD-10-CM

## 2019-06-08 DIAGNOSIS — R6520 Severe sepsis without septic shock: Secondary | ICD-10-CM | POA: Diagnosis present

## 2019-06-08 DIAGNOSIS — E782 Mixed hyperlipidemia: Secondary | ICD-10-CM

## 2019-06-08 LAB — POCT I-STAT 7, (LYTES, BLD GAS, ICA,H+H)
Acid-base deficit: 4 mmol/L — ABNORMAL HIGH (ref 0.0–2.0)
Bicarbonate: 20.8 mmol/L (ref 20.0–28.0)
Calcium, Ion: 1.06 mmol/L — ABNORMAL LOW (ref 1.15–1.40)
HCT: 31 % — ABNORMAL LOW (ref 36.0–46.0)
Hemoglobin: 10.5 g/dL — ABNORMAL LOW (ref 12.0–15.0)
O2 Saturation: 97 %
Patient temperature: 98.6
Potassium: 3.4 mmol/L — ABNORMAL LOW (ref 3.5–5.1)
Sodium: 137 mmol/L (ref 135–145)
TCO2: 22 mmol/L (ref 22–32)
pCO2 arterial: 34.4 mmHg (ref 32.0–48.0)
pH, Arterial: 7.39 (ref 7.350–7.450)
pO2, Arterial: 92 mmHg (ref 83.0–108.0)

## 2019-06-08 LAB — COMPREHENSIVE METABOLIC PANEL
ALT: 24 U/L (ref 0–44)
AST: 52 U/L — ABNORMAL HIGH (ref 15–41)
Albumin: 2.6 g/dL — ABNORMAL LOW (ref 3.5–5.0)
Alkaline Phosphatase: 48 U/L (ref 38–126)
Anion gap: 12 (ref 5–15)
BUN: 22 mg/dL (ref 8–23)
CO2: 22 mmol/L (ref 22–32)
Calcium: 7.5 mg/dL — ABNORMAL LOW (ref 8.9–10.3)
Chloride: 104 mmol/L (ref 98–111)
Creatinine, Ser: 1.2 mg/dL — ABNORMAL HIGH (ref 0.44–1.00)
GFR calc Af Amer: 47 mL/min — ABNORMAL LOW (ref >60)
GFR calc non Af Amer: 41 mL/min — ABNORMAL LOW (ref >60)
Glucose, Bld: 136 mg/dL — ABNORMAL HIGH (ref 70–99)
Potassium: 3.7 mmol/L (ref 3.5–5.1)
Sodium: 138 mmol/L (ref 135–145)
Total Bilirubin: 0.3 mg/dL (ref 0.3–1.2)
Total Protein: 6.3 g/dL — ABNORMAL LOW (ref 6.5–8.1)

## 2019-06-08 LAB — CBC WITH DIFFERENTIAL/PLATELET
Abs Immature Granulocytes: 0.03 10*3/uL (ref 0.00–0.07)
Basophils Absolute: 0 10*3/uL (ref 0.0–0.1)
Basophils Relative: 0 %
Eosinophils Absolute: 0 10*3/uL (ref 0.0–0.5)
Eosinophils Relative: 0 %
HCT: 35.5 % — ABNORMAL LOW (ref 36.0–46.0)
Hemoglobin: 11.5 g/dL — ABNORMAL LOW (ref 12.0–15.0)
Immature Granulocytes: 1 %
Lymphocytes Relative: 8 %
Lymphs Abs: 0.5 10*3/uL — ABNORMAL LOW (ref 0.7–4.0)
MCH: 29.8 pg (ref 26.0–34.0)
MCHC: 32.4 g/dL (ref 30.0–36.0)
MCV: 92 fL (ref 80.0–100.0)
Monocytes Absolute: 0.2 10*3/uL (ref 0.1–1.0)
Monocytes Relative: 3 %
Neutro Abs: 5 10*3/uL (ref 1.7–7.7)
Neutrophils Relative %: 88 %
Platelets: 206 10*3/uL (ref 150–400)
RBC: 3.86 MIL/uL — ABNORMAL LOW (ref 3.87–5.11)
RDW: 13.8 % (ref 11.5–15.5)
WBC: 5.7 10*3/uL (ref 4.0–10.5)
nRBC: 0 % (ref 0.0–0.2)

## 2019-06-08 LAB — FERRITIN: Ferritin: 389 ng/mL — ABNORMAL HIGH (ref 11–307)

## 2019-06-08 LAB — TYPE AND SCREEN
ABO/RH(D): O POS
Antibody Screen: NEGATIVE

## 2019-06-08 LAB — HEPATITIS B SURFACE ANTIGEN: Hepatitis B Surface Ag: NONREACTIVE

## 2019-06-08 LAB — PHOSPHORUS: Phosphorus: 4 mg/dL (ref 2.5–4.6)

## 2019-06-08 LAB — C-REACTIVE PROTEIN: CRP: 21.2 mg/dL — ABNORMAL HIGH (ref <1.0)

## 2019-06-08 LAB — MAGNESIUM: Magnesium: 2.2 mg/dL (ref 1.7–2.4)

## 2019-06-08 LAB — ABO/RH: ABO/RH(D): O POS

## 2019-06-08 LAB — D-DIMER, QUANTITATIVE: D-Dimer, Quant: 3.01 ug/mL-FEU — ABNORMAL HIGH (ref 0.00–0.50)

## 2019-06-08 MED ORDER — VITAMIN D 25 MCG (1000 UNIT) PO TABS
5000.0000 [IU] | ORAL_TABLET | Freq: Every day | ORAL | Status: DC
  Administered 2019-06-08 – 2019-06-10 (×3): 5000 [IU] via ORAL
  Filled 2019-06-08 (×3): qty 5

## 2019-06-08 MED ORDER — ALBUTEROL SULFATE HFA 108 (90 BASE) MCG/ACT IN AERS
2.0000 | INHALATION_SPRAY | Freq: Four times a day (QID) | RESPIRATORY_TRACT | Status: DC | PRN
  Filled 2019-06-08: qty 6.7

## 2019-06-08 MED ORDER — POTASSIUM CHLORIDE IN NACL 20-0.9 MEQ/L-% IV SOLN
INTRAVENOUS | Status: AC
  Administered 2019-06-08: 13:00:00 via INTRAVENOUS
  Filled 2019-06-08: qty 1000

## 2019-06-08 MED ORDER — ALPRAZOLAM 0.5 MG PO TABS
0.2500 mg | ORAL_TABLET | Freq: Every day | ORAL | Status: DC
  Administered 2019-06-09 – 2019-06-11 (×3): 0.25 mg via ORAL
  Filled 2019-06-08 (×4): qty 1

## 2019-06-08 MED ORDER — ALPRAZOLAM 0.5 MG PO TABS
0.5000 mg | ORAL_TABLET | Freq: Every day | ORAL | Status: DC
  Administered 2019-06-08 – 2019-06-10 (×3): 0.5 mg via ORAL
  Filled 2019-06-08 (×3): qty 1

## 2019-06-08 MED ORDER — SENNOSIDES-DOCUSATE SODIUM 8.6-50 MG PO TABS
3.0000 | ORAL_TABLET | Freq: Every day | ORAL | Status: DC
  Administered 2019-06-08 – 2019-06-09 (×2): 3 via ORAL
  Filled 2019-06-08 (×2): qty 3

## 2019-06-08 MED ORDER — ADULT MULTIVITAMIN W/MINERALS CH
1.0000 | ORAL_TABLET | Freq: Every day | ORAL | Status: DC
  Administered 2019-06-08 – 2019-06-11 (×4): 1 via ORAL
  Filled 2019-06-08 (×4): qty 1

## 2019-06-08 MED ORDER — VITAMIN B-12 1000 MCG PO TABS
1000.0000 ug | ORAL_TABLET | Freq: Every day | ORAL | Status: DC
  Administered 2019-06-08 – 2019-06-10 (×3): 1000 ug via ORAL
  Filled 2019-06-08 (×3): qty 1

## 2019-06-08 MED ORDER — SODIUM CHLORIDE 0.9 % IV BOLUS
500.0000 mL | Freq: Once | INTRAVENOUS | Status: AC
  Administered 2019-06-08: 500 mL via INTRAVENOUS

## 2019-06-08 MED ORDER — BIOTIN 5000 MCG PO TABS: 5000.0000 ug | ORAL_TABLET | Freq: Every day | ORAL | Status: DC

## 2019-06-08 MED ORDER — MULTIVITAMIN ADULT PO CHEW: 2.0000 | CHEWABLE_TABLET | Freq: Every day | ORAL | Status: DC

## 2019-06-08 MED ORDER — CYCLOSPORINE 0.05 % OP EMUL
1.0000 [drp] | Freq: Two times a day (BID) | OPHTHALMIC | Status: DC
  Administered 2019-06-08 – 2019-06-11 (×7): 1 [drp] via OPHTHALMIC
  Filled 2019-06-08 (×9): qty 30

## 2019-06-08 NOTE — ED Notes (Signed)
Patient denies pain, says she is "just too tired" and is resting comfortably.

## 2019-06-08 NOTE — ED Notes (Signed)
Pt ambulated around room unassisted per pt request. Pt then sat up in bed. spO2 99 on [place] so nonrebreather taken off on trial. Will continue to [place]

## 2019-06-08 NOTE — ED Notes (Signed)
Per [facility], patient will need a proof of Positive COVID-19 result to transport to [place]. PT says her result was from her PCP office Dr. Rex Kras at [facility] office, I was notified that a nurse will call back soon

## 2019-06-08 NOTE — ED Notes (Signed)
Patient helped to set up for breakfast  Patient talked about her family With permission, her daughter and husband are called to give update and she is encouraged to talk to them while she was up

## 2019-06-08 NOTE — Progress Notes (Signed)
Patient arrived few hours ago from [facility], ER, seen by one of my partners today.  She has COVID-19 pneumonia with acute hypoxic respiratory failure requiring 4 L nasal cannula oxygen.  CRP is elevated at 21.  I had detailed discussions with patient over the phone and her main decision maker her daughter Brenda [name] over the phone.  Described in detail with severity of illness, both of them understand that she is seriously sick and that they would like to make sure that maximal treatment is provided if needed.  Both patient and her daughter Brenda [name] have consented for convalescent plasma and Actemra use if needed.  Actemra off label use - patient and her daughter Brenda [name] were told that if COVID-19 pneumonitis gets worse we might potentially use Actemra off label, she denies any known history of tuberculosis or hepatitis, understands the risks and benefits and wants to proceed with Actemra treatment if required.

## 2019-06-08 NOTE — ED Notes (Signed)
Received proof of COVID-19 result from [place] it to [facility] to request transport Report called to accepting patient

## 2019-06-08 NOTE — Progress Notes (Signed)
PHARMACIST - PHYSICIAN ORDER COMMUNICATION  CONCERNING: P&T Medication Policy on Herbal Medications  DESCRIPTION:  This patient's order for:  Biotin  has been noted.  This product(s) is classified as an "herbal" or natural product. Due to a lack of definitive safety studies or FDA approval, nonstandard manufacturing practices, plus the potential risk of unknown drug-drug interactions while on inpatient medications, the Pharmacy and Therapeutics Committee does not permit the use of "herbal" or natural products of this type within [facility].   ACTION TAKEN: The pharmacy department is unable to verify this order at this time. Please reevaluate patient's clinical condition at discharge and address if the herbal or natural product(s) should be resumed at that time.  Arty Baumgartner, [place] Pager: [phone] 06/08/2019 10:55 AM

## 2019-06-08 NOTE — Progress Notes (Signed)
[facility]  PROGRESS NOTE  Brenda [name] [id number] DOB: 1931/03/22 DOA: 06-22-2019 PCP: [name], MD Admit date - 06-22-2019   Admitting Physician No admitting provider for patient encounter.  Outpatient Primary MD for the patient is [name], MD  LOS - 1 Brief Narrative   Brenda [name] is a 85 y.o. year old female with medical history significant for Aortic stenosis s/p TAVR (11/2018), HTN, HLD, hypothryoidism, CHFpEF who presented on June 22, 2019 ater recently testing positive for COVID (5 days prior to admission, reportedly at her PCP office )with worsening fever of 102 at home, SOB, generalized weakness, diminished appetite. In ED patient was tachycardic, hypoxic to 90s requiring 4L O2, RR 28-30, BP  Range 95/52-106/56 and CXR with ground glass opacities concerning for atypica/viral pneumonia. She was admitted with working diagnosis of acute hypoxic respiratory failure presumed secondary to COVID pneumonia and started on IV solumedrol and remesdivir while awaiting transfer to [place].   Of note her husband's sisters and her nephew tested positive for COVID shortly after visiting them a few days ago. One of the sister's passed away on 06/22/23 and the other sister is currently hospitalized. Ferritin: 358--> 389 D- Dimer : 3.29-->3.01 CRP: 18.8-->21.2 AST: 52 ALT:24    Subjective  Ms. Sangha today eating breakfast. Mild cough. No SOB. No fevers.  A & P    Acute hypoxic respiratory failure presumedsecondary to COVID pneumonia. CXR with ggo consistent with viral/atypical pna, covid being most likely. No respiratory distress  on 4L. Several sick contacts with COVID. Nursing getting outpatient documentation for prior COVID +test at PCP, otherwise will need to repeat. Continue Remdesivir and IV solumedrol. Has remained afebrile here with no leukopenia. Monitoring blood cultures, serial inflammatory markers  Severe Sepsis with acute organ dysfunction, suspect secondary  to COVID.  Presented with tachycardia, tachypnea, hypotension without lactic acidosis with AKI.  Tachycardia has resolved, tachypnea is improving, patient still requiring IV fluids for low BP but without lactic acidosis.  AKI also improved with IV fluids.  Less suspicious for a bacterial component given pro-Cal currently unremarkable, remains afebrile, chest x-ray imaging more consistent with atypical presentation, closely monitor blood cultures, would add antibiotics if becomes febrile or clinically worsens  Hypotension.  SBP in the 90s.  DC amlodipine.  Mentating well.  Give normal saline bolus, continue maintenance fluids, closely monitor. Lactic acid wnl  AKI on CKD Stage 3, likely prerenal related to hypotension/Sepsis due to above, improving.  Baseline creatinine 0.9-1, currently 1.2, 1.3 for admission.  Continue IV fluids, avoid nephrotoxins, daily BMP.  Hypokalemia.  Holding home Aldactone given hypotension.  Mild Lee low potassium of 3.4 admission.  Now 3.7 after repletion.  Aortic stenosis status post TAVR (11/2018).  Continue close monitoring of BP, volume dependent  CAD.  Asymptomatic,  home plavix  Hypothyroidsim. Synthroid  CHFpEF, compensated on exam. Holding aldactone given hypotension  Hyperlipidemia, stable.  Continue home Crestor     Family Communication  : None at bedside  Code Status : Full code, discussed on admission  Disposition Plan  : Pending.  Covid test positive awaiting transfer to [facility] continue remdesivir, IV Solu-Medrol, monitoring blood cultures, close monitoring of BP  Consults  : None  Procedures  : None  DVT Prophylaxis  :  Lovenox   Lab Results  Component Value Date   PLT 206 06/08/2019    Diet :  Diet Order            Diet regular Room  service appropriate? Yes; Fluid consistency: Thin  Diet effective now               Inpatient Medications Scheduled Meds: . ALPRAZolam  0.5 mg Oral Q0600  . ALPRAZolam  1 mg Oral QHS   . amLODipine  5 mg Oral Daily  . aspirin  81 mg Oral Daily  . clopidogrel  75 mg Oral Q breakfast  . enoxaparin (LOVENOX) injection  30 mg Subcutaneous Q24H  . levothyroxine  75 mcg Oral Q0600  . methylPREDNISolone (SOLU-MEDROL) injection  0.5 mg/kg Intravenous Q12H  . rosuvastatin  40 mg Oral Daily  . vitamin C  500 mg Oral Daily  . zinc sulfate  220 mg Oral Daily   Continuous Infusions: . 0.9 % NaCl with KCl 20 mEq / L 100 mL/hr at 06/07/19 2219  . remdesivir 100 mg in NS 250 mL     PRN Meds:.acetaminophen, ondansetron **OR** ondansetron (ZOFRAN) IV  Antibiotics  :   Anti-infectives (From admission, onward)   Start     Dose/Rate Route Frequency Ordered Stop   06/08/19 2030  remdesivir 100 mg in sodium chloride 0.9 % 250 mL IVPB     100 mg 500 mL/hr over 30 Minutes Intravenous Every 24 hours 06/07/19 1947 06/12/19 2029   06/07/19 2030  remdesivir 200 mg in sodium chloride 0.9 % 250 mL IVPB     200 mg 500 mL/hr over 30 Minutes Intravenous Once 06/07/19 1947 06/07/19 2220       Objective   Vitals:   06/08/19 0630 06/08/19 0700 06/08/19 0730 06/08/19 0800  BP: (!) 107/59 (!) 96/54 (!) 107/56 (!) 104/58  Pulse: 61 (!) 58 60 60  Resp: (!) 30 (!) 24 (!) 26 (!) 26  Temp:      TempSrc:      SpO2: 92% 95% 94% 96%  Weight:      Height:        SpO2: 96 % O2 Flow Rate (L/min): 4 L/min  Wt Readings from Last 3 Encounters:  06/07/19 63.5 kg  04/16/19 65.6 kg  02/19/19 64.9 kg     Intake/Output Summary (Last 24 hours) at 06/08/2019 0835 Last data filed at 06/08/2019 0342 Gross per 24 hour  Intake -  Output 300 ml  Net -300 ml    Physical Exam:  Awake Alert, Oriented X 3, Normal affect No new F.N deficits,  [place].AT, No JVD On 4 L nasal cannula, crackles bilaterally up to mid bases, diminished breath sounds at left base, no respiratory distress RRR, systolic murmur loudest in upper right intercostal space +ve B.Sounds, Abd Soft, No tenderness, No organomegaly  appreciated, No rebound, guarding or rigidity. No Cyanosis, Clubbing or edema, No new Rash or bruise     I have personally reviewed the following:   Data Reviewed:  CBC Recent Labs  Lab 06/07/19 1658 06/07/19 1940 06/08/19 0116 06/08/19 0440  WBC 5.9 6.3  --  5.7  HGB 12.0 11.5* 10.5* 11.5*  HCT 35.5* 34.8* 31.0* 35.5*  PLT PLATELET CLUMPS NOTED ON SMEAR, UNABLE TO ESTIMATE 188  --  206  MCV 90.3 90.6  --  92.0  MCH 30.5 29.9  --  29.8  MCHC 33.8 33.0  --  32.4  RDW 13.6 13.9  --  13.8  LYMPHSABS 0.6*  --   --  0.5*  MONOABS 0.4  --   --  0.2  EOSABS 0.0  --   --  0.0  BASOSABS 0.0  --   --  0.0    Chemistries  Recent Labs  Lab 06/07/19 1658 06/07/19 1940 06/08/19 0116 06/08/19 0440  NA 135  --  137 138  K 3.4*  --  3.4* 3.7  CL 104  --   --  104  CO2 18*  --   --  22  GLUCOSE 104*  --   --  136*  BUN 23  --   --  22  CREATININE 1.34* 1.23*  --  1.20*  CALCIUM 7.7*  --   --  7.5*  MG  --   --   --  2.2  AST 54*  --   --  52*  ALT 25  --   --  24  ALKPHOS 50  --   --  48  BILITOT 0.5  --   --  0.3   ------------------------------------------------------------------------------------------------------------------ Recent Labs    06/07/19 1940  CHOL 95  HDL 33*  LDLCALC 46  TRIG 78  CHOLHDL 2.9    Lab Results  Component Value Date   HGBA1C 5.4 11/23/2018   ------------------------------------------------------------------------------------------------------------------ Recent Labs    06/07/19 1718  TSH 1.119   ------------------------------------------------------------------------------------------------------------------ Recent Labs    06/07/19 1940 06/08/19 0440  FERRITIN 358* 389*    Coagulation profile No results for input(s): INR, PROTIME in the last 168 hours.  Recent Labs    06/07/19 1940 06/08/19 0440  DDIMER 3.29* 3.01*    Cardiac Enzymes No results for input(s): CKMB, TROPONINI, MYOGLOBIN in the last 168 hours.   Invalid input(s): CK ------------------------------------------------------------------------------------------------------------------    Component Value Date/Time   BNP 87.3 06/07/2019 1719    Micro Results No results found for this or any previous visit (from the past 240 hour(s)).  Radiology Reports Dg Chest Portable 1 View  Result Date: 06/07/2019 CLINICAL DATA:  COVID-19 positive, hypoxia EXAM: PORTABLE CHEST 1 VIEW COMPARISON:  11/28/2018 FINDINGS: The heart size and mediastinal contours are stable. Postsurgical changes of TAVR. Calcific aortic knob. There are mild diffuse ground-glass opacities throughout both lungs in a predominantly peripheral distribution. Chronic left 4 and 5 rib fractures. IMPRESSION: Diffuse ground-glass opacities throughout both lungs in a predominantly peripheral distribution. Findings suggestive of atypical/viral pneumonia given the patient's history. Electronically Signed   By: Davina Poke M.D.   On: 06/07/2019 17:38     Time Spent in minutes  30     Desiree Hane M.D on 06/08/2019 at 8:35 AM  To page go to [email or web address] - password [facility]

## 2019-06-08 NOTE — ED Notes (Signed)
Called family to notify of patient's transport to [place]

## 2019-06-08 NOTE — Progress Notes (Signed)
Patient will need a positive COVID result in Epic or hard copy faxed to [place] prior to transportation.  This RN spoke with Optician, dispensing at [facility] to give message.  [place] [facility] fax [phone] if needed.  Charge RN phone [phone].

## 2019-06-08 NOTE — ED Notes (Addendum)
Pt sating in the mid to low 80s on [place] at 5LPM. Placed NRB mask at 10 LPM. SPO2 now reads 97%. Text page MD. Will continue to [facility]

## 2019-06-08 NOTE — Progress Notes (Signed)
Spoke with pt daughter Lynelle Smoke. Updated her on pt status. All questions answered. No farther questions at this time.

## 2019-06-08 NOTE — ED Notes (Signed)
Dr. Criss Alvine arrived to pt. Informed of increase in o2 need and suggested change from med-surg status. Md ordered ABG

## 2019-06-09 DIAGNOSIS — E861 Hypovolemia: Secondary | ICD-10-CM

## 2019-06-09 DIAGNOSIS — I5032 Chronic diastolic (congestive) heart failure: Secondary | ICD-10-CM

## 2019-06-09 DIAGNOSIS — Z952 Presence of prosthetic heart valve: Secondary | ICD-10-CM

## 2019-06-09 DIAGNOSIS — F419 Anxiety disorder, unspecified: Secondary | ICD-10-CM

## 2019-06-09 DIAGNOSIS — J1289 Other viral pneumonia: Secondary | ICD-10-CM

## 2019-06-09 DIAGNOSIS — N1832 Chronic kidney disease, stage 3b: Secondary | ICD-10-CM

## 2019-06-09 DIAGNOSIS — N179 Acute kidney failure, unspecified: Secondary | ICD-10-CM

## 2019-06-09 DIAGNOSIS — I9589 Other hypotension: Secondary | ICD-10-CM

## 2019-06-09 DIAGNOSIS — I350 Nonrheumatic aortic (valve) stenosis: Secondary | ICD-10-CM

## 2019-06-09 DIAGNOSIS — E876 Hypokalemia: Secondary | ICD-10-CM

## 2019-06-09 DIAGNOSIS — E039 Hypothyroidism, unspecified: Secondary | ICD-10-CM

## 2019-06-09 DIAGNOSIS — E7800 Pure hypercholesterolemia, unspecified: Secondary | ICD-10-CM

## 2019-06-09 DIAGNOSIS — I10 Essential (primary) hypertension: Secondary | ICD-10-CM

## 2019-06-09 DIAGNOSIS — K2270 Barrett's esophagus without dysplasia: Secondary | ICD-10-CM

## 2019-06-09 DIAGNOSIS — U071 COVID-19: Secondary | ICD-10-CM

## 2019-06-09 LAB — CBC WITH DIFFERENTIAL/PLATELET
Abs Immature Granulocytes: 0.06 10*3/uL (ref 0.00–0.07)
Basophils Absolute: 0 10*3/uL (ref 0.0–0.1)
Basophils Relative: 0 %
Eosinophils Absolute: 0 10*3/uL (ref 0.0–0.5)
Eosinophils Relative: 0 %
HCT: 31.5 % — ABNORMAL LOW (ref 36.0–46.0)
Hemoglobin: 10.3 g/dL — ABNORMAL LOW (ref 12.0–15.0)
Immature Granulocytes: 1 %
Lymphocytes Relative: 7 %
Lymphs Abs: 0.6 10*3/uL — ABNORMAL LOW (ref 0.7–4.0)
MCH: 29.7 pg (ref 26.0–34.0)
MCHC: 32.7 g/dL (ref 30.0–36.0)
MCV: 90.8 fL (ref 80.0–100.0)
Monocytes Absolute: 0.4 10*3/uL (ref 0.1–1.0)
Monocytes Relative: 4 %
Neutro Abs: 7.3 10*3/uL (ref 1.7–7.7)
Neutrophils Relative %: 88 %
Platelets: 210 10*3/uL (ref 150–400)
RBC: 3.47 MIL/uL — ABNORMAL LOW (ref 3.87–5.11)
RDW: 14 % (ref 11.5–15.5)
WBC: 8.3 10*3/uL (ref 4.0–10.5)
nRBC: 0 % (ref 0.0–0.2)

## 2019-06-09 LAB — COMPREHENSIVE METABOLIC PANEL
ALT: 26 U/L (ref 0–44)
AST: 54 U/L — ABNORMAL HIGH (ref 15–41)
Albumin: 2.6 g/dL — ABNORMAL LOW (ref 3.5–5.0)
Alkaline Phosphatase: 49 U/L (ref 38–126)
Anion gap: 9 (ref 5–15)
BUN: 33 mg/dL — ABNORMAL HIGH (ref 8–23)
CO2: 19 mmol/L — ABNORMAL LOW (ref 22–32)
Calcium: 7.5 mg/dL — ABNORMAL LOW (ref 8.9–10.3)
Chloride: 109 mmol/L (ref 98–111)
Creatinine, Ser: 1.04 mg/dL — ABNORMAL HIGH (ref 0.44–1.00)
GFR calc Af Amer: 56 mL/min — ABNORMAL LOW (ref >60)
GFR calc non Af Amer: 49 mL/min — ABNORMAL LOW (ref >60)
Glucose, Bld: 146 mg/dL — ABNORMAL HIGH (ref 70–99)
Potassium: 3.6 mmol/L (ref 3.5–5.1)
Sodium: 137 mmol/L (ref 135–145)
Total Bilirubin: 0.3 mg/dL (ref 0.3–1.2)
Total Protein: 6 g/dL — ABNORMAL LOW (ref 6.5–8.1)

## 2019-06-09 LAB — C-REACTIVE PROTEIN: CRP: 14.5 mg/dL — ABNORMAL HIGH (ref <1.0)

## 2019-06-09 LAB — FERRITIN: Ferritin: 417 ng/mL — ABNORMAL HIGH (ref 11–307)

## 2019-06-09 LAB — MAGNESIUM: Magnesium: 2.3 mg/dL (ref 1.7–2.4)

## 2019-06-09 LAB — PHOSPHORUS: Phosphorus: 2.5 mg/dL (ref 2.5–4.6)

## 2019-06-09 LAB — PROCALCITONIN: Procalcitonin: <0.1 ng/mL

## 2019-06-09 LAB — BRAIN NATRIURETIC PEPTIDE: B Natriuretic Peptide: 192 pg/mL — ABNORMAL HIGH (ref 0.0–100.0)

## 2019-06-09 LAB — LACTATE DEHYDROGENASE: LDH: 423 U/L — ABNORMAL HIGH (ref 98–192)

## 2019-06-09 LAB — D-DIMER, QUANTITATIVE: D-Dimer, Quant: 2.3 ug/mL-FEU — ABNORMAL HIGH (ref 0.00–0.50)

## 2019-06-09 MED ORDER — POTASSIUM CHLORIDE CRYS ER 20 MEQ PO TBCR
50.0000 meq | EXTENDED_RELEASE_TABLET | Freq: Once | ORAL | Status: AC
  Administered 2019-06-09: 50 meq via ORAL
  Filled 2019-06-09: qty 3

## 2019-06-09 MED ORDER — ENOXAPARIN SODIUM 30 MG/0.3ML ~~LOC~~ SOLN
30.0000 mg | SUBCUTANEOUS | Status: DC
  Administered 2019-06-09 – 2019-06-10 (×2): 30 mg via SUBCUTANEOUS
  Filled 2019-06-09 (×2): qty 0.3

## 2019-06-09 MED ORDER — DEXAMETHASONE 6 MG PO TABS
6.0000 mg | ORAL_TABLET | Freq: Every day | ORAL | Status: DC
  Administered 2019-06-09 – 2019-06-11 (×3): 6 mg via ORAL
  Filled 2019-06-09 (×3): qty 1

## 2019-06-09 NOTE — Progress Notes (Signed)
Pt was admitted for COVID. Lovenox was ordered for DVT px. She has a hx of SDH in 2015. D/w Dr. Sherral Hammers again about risk and we agreed to proceed with lovenox for her DVT px due to elevated risk with COVID but we will use reduced dose.   CrCl~34 Hgb 10.3 Plt 210  Lovenox 30mg  SQ q24 Monitor for bleeding  Onnie Boer, PharmD, BCIDP, AAHIVP, CPP Infectious Disease Pharmacist 06/09/2019 2:19 PM

## 2019-06-09 NOTE — TOC Initial Note (Signed)
Transition of Care [facility]) - Initial/Assessment Note    Patient Details  Name: Brenda [name] MRN: [id number] Date of Birth: [birth year]  [facility] of [facility]) CM/SW Contact:    Ninfa Meeker, RN Phone Number: 06/09/2019, 3:25 PM  Clinical Narrative:       Patient is 85 yr old female.with  PMhx.of Anxiety, subdural hematoma, Barrett's esophagitis, irritable bowel syndrome, severe aortic stenosis S/Pt TAVR in 11/2018, HTN, chronic diastolic CHF,Hypothyroidism. Presented to emergency department with fever, shortness of breath and generalized weakness. Patient reports that she was tested positive for COVID-19 about 5 days ago after she had a previously negative test. Reports that her symptoms are getting worse, she had fever of 102, she feels weak, lethargic. Patient says that she likely got the COVID-19 infection from her husband and sister who passed away this morning due to COVID-19. Patient is on Decadron, Remdesivir, IV fluids. Oxygen has been titrated down to [facility]. Case manager spoke with patient's daughter concerning recommendation for [place]. Patient lives home with her husband. Referral for Home Health was called to Adela Lank, [facility] Liaison. Should she need Oxygen for home [facility] will be agency.  CM will continue to monitor for needs.                          Patient Goals and CMS Choice        Expected Discharge Plan and Services                                                Prior Living Arrangements/Services                       Activities of Daily Living      Permission Sought/Granted                  Emotional Assessment              Admission diagnosis:  SOB (shortness of breath) [R06.02] Hypoxia [R09.02] COVID-19 [U07.1] Patient Active Problem List   Diagnosis Date Noted  . Hypotension 06/08/2019  . Severe sepsis ([place]) 06/08/2019  . Acute respiratory failure with hypoxia ([place]) 06/07/2019  .  Pneumonia due to COVID-19 virus 06/07/2019  . Acute on chronic kidney failure ([place])   . Chronic diastolic CHF (congestive heart failure) ([place])   . History of traumatic subdural hematoma   . S/P TAVR (transcatheter aortic valve replacement)   . Severe aortic stenosis   . Neck arthritis 09/08/2018  . Multiple fractures of ribs of left side 12/24/2013  . Urinary retention 12/24/2013  . Hypokalemia 07/12/2013  . CAD (coronary artery disease) 04/20/2013  . Hypothyroidism 07/09/2010  . CONSTIPATION 07/09/2010  . HEMATEMESIS 08/14/2008  . Sarcoidosis 08/09/2008  . Hyperlipidemia 08/09/2008  . Anxiety 08/09/2008  . Hypertension 08/09/2008  . HEMORRHOIDS 08/09/2008  . GERD 08/09/2008  . BARRETTS ESOPHAGUS 08/09/2008  . Irritable bowel syndrome 08/09/2008  . DEGENERATIVE JOINT DISEASE 08/09/2008  . COLONIC POLYPS, ADENOMATOUS, HX OF 08/09/2008  . HELICOBACTER PYLORI GASTRITIS, HX OF 08/09/2008   PCP:  [name], MD Pharmacy:   [facility] [address], [place] - [place] AT [place] [address] [place] [address] Phone: [phone] Fax: [phone]  Social Determinants of Health (SDOH) Interventions    Readmission Risk Interventions Readmission Risk Prevention Plan 11/29/2018  Post Dischage Appt Complete  Medication Screening Complete  Transportation Screening Complete  Some recent data might be hidden

## 2019-06-09 NOTE — Progress Notes (Signed)
Occupational Therapy Evaluation  PTA, pt lived at home with her husband, who has ALS. Pt is his primary caregiver. Pt desats to mid 46s with ambulation to bathroom on RA but does not demonstrate increased WOB.O2 increased to 2L with rebound into 90s. Pt upset about not being with her husband. Educated pt on importance of finishing her medication per MD recommendations. Pt verbalized understanding. Recommend initial 24/7 S on DC. Pt states her daughter will be able to assist after DC. Will follow acutely    06/09/19 1300  OT Visit Information  Last OT Received On 06/09/19  Assistance Needed +1  History of Present Illness 85 y.o. year old female with medical history significant for Aortic stenosis s/p TAVR (11/2018), HTN, HLD, hypothryoidism, CHFpEF who presented on 06/07/2019 ater recently testing positive for COVID (5 days prior to admission, reportedly at her PCP office )with worsening fever of 102 at home, SOB, generalized weakness, diminished appetite. In ED patient was tachycardic, hypoxic to 90s requiring 4L O2, RR 28-30, BP  Range 95/52-106/56 and CXR with ground glass opacities concerning for atypica/viral pneumonia. She was admitted with working diagnosis of acute hypoxic respiratory failure presumed secondary to COVID pneumonia   Precautions  Precautions Fall  Restrictions  Weight Bearing Restrictions No  Home Living  Family/patient expects to be discharged to: Private residence  Living Arrangements Spouse/significant other  Available Help at Discharge Family  Type of [place] to enter  Entrance Stairs-Number of Steps 2-3  Entrance Stairs-Rails  (has rail)  Home Layout Two level;Able to live on main level with bedroom/bathroom  Print production planner Handicapped height  Bathroom Accessibility Yes  How Accessible Accessible via walker  [place] - single point;Walker - 2 wheels;Shower seat;Grab bars - tub/shower;Hand held  shower head  Prior Function  Level of Independence Independent with assistive device(s);Independent  Comments states she does not use an AD; cooks/cleans and takes care of her husband   Communication  Communication Other (comment) (cognition altered has difficulty understanding concepts)  Pain Assessment  Pain Assessment No/denies pain  Cognition  Arousal/Alertness Awake/alert  Behavior During Therapy Anxious;Restless (wants to go home today)  Overall Cognitive Status No family/caregiver present to determine baseline cognitive functioning  General Comments Poor awareness of illness and needing to stay in the hospital for treatment; wanting to go home to take care of her husband  Upper Extremity Assessment  Upper Extremity Assessment Generalized weakness  Lower Extremity Assessment  Lower Extremity Assessment Defer to PT evaluation  Cervical / Trunk Assessment  Cervical / Trunk Assessment Normal  ADL  Overall ADL's  Needs assistance/impaired  Grooming Set up;Standing  Upper Body Bathing Set up;Sitting  Lower Body Bathing Supervison/ safety;Set up;Sit to/from stand  Upper Body Dressing  Set up;Sitting  Lower Body Dressing Min guard;Sit to/from Environmental education officer guard;Ambulation  Toileting- Water quality scientist and Hygiene Supervision/safety;Sit to/from stand  Functional mobility during ADLs Min guard  General ADL Comments appears to fatigue however pt with poor awareness of activity level and is perseverating on getting home to her husband  Bed Mobility  Overal bed mobility Needs Assistance  Bed Mobility Supine to Sit;Sit to Supine  Supine to sit Supervision  Sit to supine HOB elevated  General bed mobility comments uses bed fixtures to get in/out of bed this am  Transfers  Overall transfer level Needs assistance  Transfers Sit to/from Stand;Stand Pivot Transfers  Sit to Stand Supervision  Stand pivot  transfers Supervision  Balance  Overall balance assessment Needs  assistance  Sitting-balance support Feet unsupported  Sitting balance-Leahy Scale Good  Standing balance support During functional activity  Standing balance-Leahy Scale Fair  Exercises  Exercises Other exercises  OT - End of Session  Equipment Utilized During Treatment Oxygen (2L)  Activity Tolerance Patient tolerated treatment well  Patient left in bed;with call bell/phone within reach;with bed alarm set  Nurse Communication Mobility status  OT Assessment  OT Recommendation/Assessment Patient needs continued OT Services  OT Visit Diagnosis Unsteadiness on feet (R26.81);Other abnormalities of gait and mobility (R26.89);Muscle weakness (generalized) (M62.81);Other symptoms and signs involving cognitive function  OT Problem List Decreased strength;Decreased activity tolerance;Impaired balance (sitting and/or standing);Decreased safety awareness;Decreased knowledge of use of DME or AE;Cardiopulmonary status limiting activity  OT Plan  OT Frequency (ACUTE ONLY) Min 3X/week  OT Treatment/Interventions (ACUTE ONLY) Self-care/ADL training;Therapeutic exercise;Neuromuscular education;Energy conservation;DME and/or AE instruction;Therapeutic activities;Cognitive remediation/compensation;Patient/family education;Balance training  AM-PAC OT "6 Clicks" Daily Activity Outcome Measure (Version 2)  Help from another person eating meals? 4  Help from another person taking care of personal grooming? 3  Help from another person toileting, which includes using toliet, bedpan, or urinal? 3  Help from another person bathing (including washing, rinsing, drying)? 3  Help from another person to put on and taking off regular upper body clothing? 3  Help from another person to put on and taking off regular lower body clothing? 3  6 Click Score 19  OT Recommendation  Follow Up Recommendations No OT follow up;Supervision - Intermittent  OT Equipment None recommended by OT  Individuals Consulted  Consulted and  Agree with Results and Recommendations Patient  Acute Rehab OT Goals  Patient Stated Goal wants to go home as soon as today  OT Goal Formulation With patient  Time For Goal Achievement 06/23/19  Potential to Achieve Goals Good  OT Time Calculation  OT Start Time (ACUTE ONLY) 1214  OT Stop Time (ACUTE ONLY) 1256  OT Time Calculation (min) 42 min  OT General Charges  $OT Visit 1 Visit  OT Evaluation  $OT Eval Moderate Complexity 1 Mod  OT Treatments  $Self Care/Home Management  23-37 mins  Written Expression  Dominant Hand Right  Maurie Boettcher, OT/L   Acute OT Clinical Specialist Acute Rehabilitation Services Pager [phone] Office [phone]

## 2019-06-09 NOTE — Progress Notes (Addendum)
PROGRESS NOTE    Brenda [name]  [id number] DOB: Aug 27, 1930 DOA: 06/07/2019 PCP: [name], MD   Brief Narrative:  85 y.o. WF PMHx Anxiety, Hx subdural hematoma, Barrett's esophagitis, irritable bowel syndrome, severe aortic stenosis S/Pt TAVR in 11/2018, HTN, chronic diastolic CHF (EF 60 to [id number]), , Hypothyroidism,  Presents to emergency department due to fever, shortness of breath and generalized weakness.  Patient reports that she was tested positive for COVID-19 about 5 days ago after she had a previously negative test.  Reports that her symptoms are getting worse, she had fever of 102, she feels weak, lethargic and tired, has decreased appetite and worsening shortness of breath.    She denies cough, congestion, wheezing, chest pain, N/V/D, leg swelling, palpitation, urinary or bowel changes.  Patient reports that she likely got the COVID-19 infection from her husband and sister who passed away this morning due to COVID-19 and she is concerned about it.  She lives with her husband at home and denies smoking, alcohol, illicit drug use.  ED Course: Upon arrival: Patient was tachycardic, tachypneic, hypoxic and 90s-patient placed on 4 L of oxygen via nasal cannula.  X-ray shows groundglass opacities throughout both lungs suggestive of atypical/viral pneumonia.    Subjective: A/O x4, negative CP, negative abdominal pain.  Positive S OB.  States wants to leave AMA, secondary to her husband having Leverne Humbles disease that she needs to care for him.  States believes she was affected by her family members.   Last 24 hours afebrile    Assessment & Plan:   Principal Problem:   Acute respiratory failure with hypoxia (HCC) Active Problems:   Hyperlipidemia   Anxiety   Hypertension   BARRETTS ESOPHAGUS   Irritable bowel syndrome   Hypothyroidism   Hypokalemia   Severe aortic stenosis   S/P TAVR (transcatheter aortic valve replacement)   Pneumonia due to COVID-19 virus  Acute on chronic kidney failure (HCC)   Chronic diastolic CHF (congestive heart failure) (HCC)   Hypotension   Severe sepsis (HCC)   Covid pneumonia/acute respiratory failure with hypoxia -Decadron 6 mg daily -Remdesivir per pharmacy protocol.  Last dose of remdesivir tentatively 11/16 -SARS coronavirus pending -Respiratory virus panel pending -Per Dr. Early Osmond note she discussed the need for potential use of Actemra off label.  Patient denied any history of tuberculosis or hepatitis and stated to her that she understood the risk and benefits and wanted to proceed with Actemra treatment if required. COVID-19 Labs  Recent Labs    06/07/19 1940 06/08/19 0440 06/09/19 0256  DDIMER 3.29* 3.01* 2.30*  FERRITIN 358* 389* 417*  LDH 398*  --  423*  CRP 18.8* 21.2* 14.5*    Lab Results  Component Value Date   SARSCOV2NAA NOT DETECTED 11/23/2018    Acute on CKD stage III(baseline Cr~1.05) Recent Labs  Lab 06/07/19 1658 06/07/19 1940 06/08/19 0440 06/09/19 0256  CREATININE 1.34* 1.23* 1.20* 1.04*  -Most likely secondary to dehydration -At baseline  Chronic diastolic CHF  -Strict in and out -Daily weight -ASA 81 mg daily -Plavix 75 mg daily -Amlodipine 5 mg daily (hold) -Spironolactone 25 mg daily (hold) -We will hold BP medication pressure on the soft side  Severe aortic stenosis -S/p TAVR -See CHF  Essential HTN -See CHF  Hypokalemia -Potassium goal> 4 -K-Dur 50 mEq  HLD -11/12 LDL= 46 -Crestor 40 mg daily  Hypothyroidism -Synthroid 75 mcg daily  Anxiety -Xanax 0.25 q.Am/0.5 mg qhs    Goals of care -11/14  PT/OT consult; evaluate for CIR vs SNF vs H/H    DVT prophylaxis: Lovenox per pharmacy Covid protocol Code Status: Full Family Communication: 11/14 spoke with Tammy (daughter) counseled on plan of care answered all questions Disposition Plan: TBD   Consultants:    Procedures/Significant Events:     I have personally reviewed and  interpreted all radiology studies and my findings are as above.  VENTILATOR SETTINGS: Nasal cannula Flow; 5 L/min SPO2; 95%   Cultures 11/12 blood 11/14 SARS coronavirus pending 11/14 respiratory virus panel pending     Antimicrobials: Anti-infectives (From admission, onward)   Start     Stop   06/08/19 1600  remdesivir 100 mg in sodium chloride 0.9 % 250 mL IVPB     06/12/19 1559   06/07/19 2030  remdesivir 200 mg in sodium chloride 0.9 % 250 mL IVPB     06/07/19 2220       Devices  LINES / TUBES:      Continuous Infusions: . remdesivir 100 mg in NS 250 mL 100 mg (06/09/19 1108)     Objective: Vitals:   06/08/19 1600 06/08/19 1631 06/08/19 2125 06/09/19 0500  BP: (!) 107/54  (!) 115/56 130/61  Pulse: (!) 59   63  Resp: (!) 22   (!) 21  Temp:   97.7 F (36.5 C) (!) 97.4 F (36.3 C)  TempSrc:   Oral Oral  SpO2: (!) 88% 95%  91%  Weight:      Height:        Intake/Output Summary (Last 24 hours) at 06/09/2019 1349 Last data filed at 06/09/2019 0523 Gross per 24 hour  Intake 241.39 ml  Output 700 ml  Net -458.61 ml   Filed Weights   06/07/19 1713  Weight: 63.5 kg    Examination:  General: A/O x4, positive acute respiratory distress Eyes: negative scleral hemorrhage, negative anisocoria, negative icterus ENT: Negative Runny nose, negative gingival bleeding, Neck:  Negative scars, masses, torticollis, lymphadenopathy, JVD Lungs: Clear to auscultation bilaterally without wheezes or crackles Cardiovascular: Regular rate and rhythm without murmur gallop or rub normal S1 and S2 Abdomen: negative abdominal pain, nondistended, positive soft, bowel sounds, no rebound, no ascites, no appreciable mass Extremities: No significant cyanosis, clubbing, or edema bilateral lower extremities Skin: Negative rashes, lesions, ulcers Psychiatric:  Negative depression, positive anxiety, negative fatigue, negative mania, EXTREMELY POOR understanding of disease  process. Central nervous system:  Cranial nerves II through XII intact, tongue/uvula midline, all extremities muscle strength 5/5, sensation intact throughout, negative dysarthria, negative expressive aphasia, negative receptive aphasia.  .     Data Reviewed: Care during the described time interval was provided by me .  I have reviewed this patient's available data, including medical history, events of note, physical examination, and all test results as part of my evaluation.   CBC: Recent Labs  Lab 06/07/19 1658 06/07/19 1940 06/08/19 0116 06/08/19 0440 06/09/19 0256  WBC 5.9 6.3  --  5.7 8.3  NEUTROABS 4.8  --   --  5.0 7.3  HGB 12.0 11.5* 10.5* 11.5* 10.3*  HCT 35.5* 34.8* 31.0* 35.5* 31.5*  MCV 90.3 90.6  --  92.0 90.8  PLT PLATELET CLUMPS NOTED ON SMEAR, UNABLE TO ESTIMATE 188  --  206 [id number]   Basic Metabolic Panel: Recent Labs  Lab 06/07/19 1658 06/07/19 1940 06/08/19 0116 06/08/19 0440 06/09/19 0256  NA 135  --  137 138 137  K 3.4*  --  3.4* 3.7 3.6  CL 104  --   --  104 109  CO2 18*  --   --  22 19*  GLUCOSE 104*  --   --  136* 146*  BUN 23  --   --  22 33*  CREATININE 1.34* 1.23*  --  1.20* 1.04*  CALCIUM 7.7*  --   --  7.5* 7.5*  MG  --   --   --  2.2 2.3  PHOS  --   --   --  4.0 2.5   GFR: Estimated Creatinine Clearance: 34.8 mL/min (A) (by C-G formula based on SCr of 1.04 mg/dL (H)). Liver Function Tests: Recent Labs  Lab 06/07/19 1658 06/08/19 0440 06/09/19 0256  AST 54* 52* 54*  ALT 25 24 26   ALKPHOS 50 48 49  BILITOT 0.5 0.3 0.3  PROT 6.6 6.3* 6.0*  ALBUMIN 2.8* 2.6* 2.6*   No results for input(s): LIPASE, AMYLASE in the last 168 hours. No results for input(s): AMMONIA in the last 168 hours. Coagulation Profile: No results for input(s): INR, PROTIME in the last 168 hours. Cardiac Enzymes: No results for input(s): CKTOTAL, CKMB, CKMBINDEX, TROPONINI in the last 168 hours. BNP (last 3 results) No results for input(s): PROBNP in the last 8760  hours. HbA1C: No results for input(s): HGBA1C in the last 72 hours. CBG: No results for input(s): GLUCAP in the last 168 hours. Lipid Profile: Recent Labs    06/07/19 1940  CHOL 95  HDL 33*  LDLCALC 46  TRIG 78  CHOLHDL 2.9   Thyroid Function Tests: Recent Labs    06/07/19 1718  TSH 1.119   Anemia Panel: Recent Labs    06/08/19 0440 06/09/19 0256  FERRITIN 389* 417*   Urine analysis:    Component Value Date/Time   COLORURINE YELLOW 11/23/2018 1054   APPEARANCEUR CLEAR 11/23/2018 1054   LABSPEC 1.011 11/23/2018 1054   PHURINE 5.0 11/23/2018 1054   GLUCOSEU NEGATIVE 11/23/2018 1054   HGBUR SMALL (A) 11/23/2018 1054   BILIRUBINUR NEGATIVE 11/23/2018 1054   KETONESUR NEGATIVE 11/23/2018 1054   PROTEINUR NEGATIVE 11/23/2018 1054   UROBILINOGEN 1.0 01/16/2014 1036   NITRITE NEGATIVE 11/23/2018 1054   LEUKOCYTESUR NEGATIVE 11/23/2018 1054   Sepsis Labs: @LABRCNTIP (procalcitonin:4,lacticidven:4)  ) Recent Results (from the past 240 hour(s))  Blood culture (routine x 2)     Status: None (Preliminary result)   Collection Time: 06/07/19  4:32 PM   Specimen: BLOOD  Result Value Ref Range Status   Specimen Description BLOOD LEFT ANTECUBITAL  Final   Special Requests   Final    BOTTLES DRAWN AEROBIC AND ANAEROBIC Blood Culture adequate volume   Culture   Final    NO GROWTH < 24 HOURS Performed at [facility], [place] [address], [place], [place] [address]    Report Status PENDING  Incomplete  Blood culture (routine x 2)     Status: None (Preliminary result)   Collection Time: 06/07/19  5:08 PM   Specimen: BLOOD  Result Value Ref Range Status   Specimen Description BLOOD RIGHT ANTECUBITAL  Final   Special Requests   Final    BOTTLES DRAWN AEROBIC AND ANAEROBIC Blood Culture adequate volume   Culture   Final    NO GROWTH < 24 HOURS Performed at [facility], [place] [address], [place], [place] [address]    Report Status PENDING  Incomplete          Radiology Studies: Dg Chest Portable 1 View  Result Date: 06/07/2019 CLINICAL DATA:  COVID-19 positive, hypoxia  EXAM: PORTABLE CHEST 1 VIEW COMPARISON:  11/28/2018 FINDINGS: The heart size and mediastinal contours are stable. Postsurgical changes of TAVR. Calcific aortic knob. There are mild diffuse ground-glass opacities throughout both lungs in a predominantly peripheral distribution. Chronic left 4 and 5 rib fractures. IMPRESSION: Diffuse ground-glass opacities throughout both lungs in a predominantly peripheral distribution. Findings suggestive of atypical/viral pneumonia given the patient's history. Electronically Signed   By: Davina Poke M.D.   On: 06/07/2019 17:38        Scheduled Meds: . ALPRAZolam  0.25 mg Oral Q0600  . ALPRAZolam  0.5 mg Oral QHS  . aspirin  81 mg Oral Daily  . cholecalciferol  5,000 Units Oral QHS  . clopidogrel  75 mg Oral Q breakfast  . cycloSPORINE  1 drop Both Eyes Q12H  . dexamethasone  6 mg Oral Daily  . levothyroxine  75 mcg Oral Q0600  . multivitamin with minerals  1 tablet Oral Daily  . potassium chloride  50 mEq Oral Once  . rosuvastatin  40 mg Oral Daily  . senna-docusate  3 tablet Oral QHS  . vitamin B-12  1,000 mcg Oral QHS  . vitamin C  500 mg Oral Daily  . zinc sulfate  220 mg Oral Daily   Continuous Infusions: . remdesivir 100 mg in NS 250 mL 100 mg (06/09/19 1108)     LOS: 2 days   The patient is critically ill with multiple organ systems failure and requires high complexity decision making for assessment and support, frequent evaluation and titration of therapies, application of advanced monitoring technologies and extensive interpretation of multiple databases. Critical Care Time devoted to patient care services described in this note  Time spent: 40 minutes     , Geraldo Docker, MD [facility] Pager [phone]  If 7PM-7AM, please contact night-coverage [email or web address] Password [email or web address] 06/09/2019, 1:49 PM

## 2019-06-09 NOTE — Evaluation (Signed)
Physical Therapy Evaluation Patient Details Name: Brenda [name] MRN: [id number] DOB: Dec 15, 1930 Today's Date: 06/09/2019   History of Present Illness  85 y.o. year old female with medical history significant for Aortic stenosis s/p TAVR (11/2018), HTN, HLD, hypothryoidism, CHFpEF who presented on 06/07/2019 ater recently testing positive for COVID (5 days prior to admission, reportedly at her PCP office )with worsening fever of 102 at home, SOB, generalized weakness, diminished appetite. In ED patient was tachycardic, hypoxic to 90s requiring 4L O2, RR 28-30, BP  Range 95/52-106/56 and CXR with ground glass opacities concerning for atypica/viral pneumonia. She was admitted with working diagnosis of acute hypoxic respiratory failure presumed secondary to COVID pneumonia   Clinical Impression   Pt admitted with above diagnosis. PTA was living home with spouse, reports that spouse has ALS (not sure of accuracy of reporting she gives as she is a poor historian). States was able to ambulate in hoemm independently or with cane.  Pt currently with functional limitations due to the deficits listed below (see PT Problem List). She now presents with declines in overall cognition and memory, declines in independence, activity tolerance, and safety. She perseverated on going home today, even after multi redirections and reorientations. She was initially on 3L.min and was sating in 90s measured via pedi earlobe probe. With ambulation approx 12ft in room desat to min 89%, dropped pf down from 3L/min via [employer] to 2L/min via [place] and pt was able to complete exercises given and maintain sats in 90s.Pt will benefit from skilled PT to increase their independence and safety with mobility to allow discharge to the venue listed below.       Follow Up Recommendations Home health PT    Equipment Recommendations  None recommended by PT    Recommendations for Other Services       Precautions / Restrictions  Precautions Precautions: Fall Precaution Comments: confusion Restrictions Weight Bearing Restrictions: No      Mobility  Bed Mobility Overal bed mobility: Needs Assistance Bed Mobility: Supine to Sit;Sit to Supine     Supine to sit: Supervision Sit to supine: HOB elevated   General bed mobility comments: uses bed fixtures to get in/out of bed this am  Transfers Overall transfer level: Needs assistance   Transfers: Sit to/from Stand;Stand Pivot Transfers Sit to Stand: Supervision Stand pivot transfers: Supervision       General transfer comment: needs SBA with cues and line management, does not recall why has lines  Ambulation/Gait Ambulation/Gait assistance: Supervision Gait Distance (Feet): 120 Feet Assistive device: None Gait Pattern/deviations: Step-through pattern Gait velocity: slow cadenced   General Gait Details: 1 LOB noted, states she was trying to avoid obstacle  Stairs            Wheelchair Mobility    Modified Rankin (Stroke Patients Only)       Balance Overall balance assessment: Needs assistance Sitting-balance support: Feet unsupported Sitting balance-Leahy Scale: Good     Standing balance support: During functional activity Standing balance-Leahy Scale: Fair Standing balance comment: 1 LOB with ambulation but able to self correct                             Pertinent Vitals/Pain Pain Assessment: No/denies pain    Home Living Family/patient expects to be discharged to:: Private residence Living Arrangements: Spouse/significant other Available Help at Discharge: Family Type of Home: House Home Access: Stairs to enter Entrance Stairs-Rails: (has rail) Technical brewer  of Steps: 2-3 Home Layout: Two level;Able to live on main level with bedroom/bathroom Home Equipment: Kasandra Knudsen - single point;Walker - 2 wheels Additional Comments: pt is quite confused and some reporting may not be completely accurate    Prior  Function Level of Independence: Independent with assistive device(s)         Comments: as per pt reporting      Hand Dominance        Extremity/Trunk Assessment   Upper Extremity Assessment Upper Extremity Assessment: Defer to OT evaluation    Lower Extremity Assessment Lower Extremity Assessment: Generalized weakness       Communication   Communication: Other (comment)(cognition altered has difficulty understanding concepts)  Cognition Arousal/Alertness: Awake/alert Behavior During Therapy: Anxious;Restless;Agitated(wants to go home today) Overall Cognitive Status: History of cognitive impairments - at baseline                                        General Comments General comments (skin integrity, edema, etc.): arrived to find pt on phone with daughter, she is conviced she is going home today and is instructing daughter to come pick her up. Attempted to reorient to illness and hospital stay but pt returns right back to my daughter is on her way to pick me up. reoriented several times during assessment. 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 At the same time states that he family members came to visit her at home and thats how she was infected w/ virus, she even states a family member has passed from [place] but fails to see dangers to own self from being ill. Pt initially on 3L/min and sats on 90s at rest, with ambulation desat to 89% min, placed on 2L/min and was able to complete exercises and maintain sats in 90s.    Exercises Other Exercises Other Exercises: incentive spirometer with verbal cues to complete  pulled 1076ml Other Exercises: flutter valve x 10 with vcs for use.   Assessment/Plan    PT Assessment Patient needs continued PT services  PT Problem List Decreased strength;Decreased activity tolerance;Decreased balance;Decreased mobility;Decreased coordination;Decreased cognition;Decreased safety awareness        PT Treatment Interventions Gait training;Functional mobility training;Therapeutic activities;Stair training;Therapeutic exercise;Balance training;Neuromuscular re-education;Patient/family education    PT Goals (Current goals can be found in the Care Plan section)  Acute Rehab PT Goals Patient Stated Goal: wants to go home as soon as today PT Goal Formulation: With patient Time For Goal Achievement: 06/23/19 Potential to Achieve Goals: Good    Frequency Min 3X/week   Barriers to discharge        Co-evaluation               AM-PAC PT "6 Clicks" Mobility  Outcome Measure Help needed turning from your back to your side while in a flat bed without using bedrails?: None Help needed moving from lying on your back to sitting on the side of a flat bed without using bedrails?: A Little Help needed moving to and from a bed to a chair (including a wheelchair)?: A Little Help needed standing up from a chair using your arms (e.g., wheelchair or bedside chair)?: A Little Help needed to walk in hospital room?: A Little Help needed climbing 3-5 steps with a railing? : A Little 6 Click Score: 19    End of Session Equipment Utilized During Treatment:  Oxygen Activity Tolerance: Treatment limited secondary to medical complications (Comment);Patient limited by lethargy Patient left: in bed;with call bell/phone within reach;with bed alarm set Nurse Communication: Mobility status(nurse in room for part of assessment) PT Visit Diagnosis: Unsteadiness on feet (R26.81);Muscle weakness (generalized) (M62.81)    Time: [id number] PT Time Calculation (min) (ACUTE ONLY): 40 min   Charges:   PT Evaluation $PT Eval Moderate Complexity: 1 Mod PT Treatments $Gait Training: 8-22 mins $Therapeutic Exercise: 8-22 mins       Horald Chestnut, PT   Delford Field 06/09/2019, 12:59 PM

## 2019-06-09 NOTE — Plan of Care (Signed)
?  Problem: Coping: ?Goal: Level of anxiety will decrease ?Outcome: Progressing ?  ?Problem: Safety: ?Goal: Ability to remain free from injury will improve ?Outcome: Progressing ?  ?

## 2019-06-10 ENCOUNTER — Other Ambulatory Visit: Payer: Self-pay | Admitting: Physician Assistant

## 2019-06-10 LAB — CBC WITH DIFFERENTIAL/PLATELET
Abs Immature Granulocytes: 0.19 10*3/uL — ABNORMAL HIGH (ref 0.00–0.07)
Basophils Absolute: 0 10*3/uL (ref 0.0–0.1)
Basophils Relative: 0 %
Eosinophils Absolute: 0 10*3/uL (ref 0.0–0.5)
Eosinophils Relative: 0 %
HCT: 31.3 % — ABNORMAL LOW (ref 36.0–46.0)
Hemoglobin: 10.3 g/dL — ABNORMAL LOW (ref 12.0–15.0)
Immature Granulocytes: 1 %
Lymphocytes Relative: 5 %
Lymphs Abs: 0.7 10*3/uL (ref 0.7–4.0)
MCH: 29.9 pg (ref 26.0–34.0)
MCHC: 32.9 g/dL (ref 30.0–36.0)
MCV: 90.7 fL (ref 80.0–100.0)
Monocytes Absolute: 0.5 10*3/uL (ref 0.1–1.0)
Monocytes Relative: 4 %
Neutro Abs: 12.1 10*3/uL — ABNORMAL HIGH (ref 1.7–7.7)
Neutrophils Relative %: 90 %
Platelets: 240 10*3/uL (ref 150–400)
RBC: 3.45 MIL/uL — ABNORMAL LOW (ref 3.87–5.11)
RDW: 14.2 % (ref 11.5–15.5)
WBC: 13.4 10*3/uL — ABNORMAL HIGH (ref 4.0–10.5)
nRBC: 0 % (ref 0.0–0.2)

## 2019-06-10 LAB — COMPREHENSIVE METABOLIC PANEL
ALT: 27 U/L (ref 0–44)
AST: 58 U/L — ABNORMAL HIGH (ref 15–41)
Albumin: 2.5 g/dL — ABNORMAL LOW (ref 3.5–5.0)
Alkaline Phosphatase: 59 U/L (ref 38–126)
Anion gap: 7 (ref 5–15)
BUN: 33 mg/dL — ABNORMAL HIGH (ref 8–23)
CO2: 20 mmol/L — ABNORMAL LOW (ref 22–32)
Calcium: 8.4 mg/dL — ABNORMAL LOW (ref 8.9–10.3)
Chloride: 113 mmol/L — ABNORMAL HIGH (ref 98–111)
Creatinine, Ser: 0.98 mg/dL (ref 0.44–1.00)
GFR calc Af Amer: >60 mL/min (ref >60)
GFR calc non Af Amer: 52 mL/min — ABNORMAL LOW (ref >60)
Glucose, Bld: 108 mg/dL — ABNORMAL HIGH (ref 70–99)
Potassium: 4.5 mmol/L (ref 3.5–5.1)
Sodium: 140 mmol/L (ref 135–145)
Total Bilirubin: 0.3 mg/dL (ref 0.3–1.2)
Total Protein: 5.7 g/dL — ABNORMAL LOW (ref 6.5–8.1)

## 2019-06-10 LAB — SARS CORONAVIRUS 2 (TAT 6-24 HRS): SARS Coronavirus 2: POSITIVE — AB

## 2019-06-10 LAB — PHOSPHORUS: Phosphorus: 2.6 mg/dL (ref 2.5–4.6)

## 2019-06-10 LAB — C-REACTIVE PROTEIN: CRP: 8.9 mg/dL — ABNORMAL HIGH (ref <1.0)

## 2019-06-10 LAB — D-DIMER, QUANTITATIVE: D-Dimer, Quant: 2.27 ug/mL-FEU — ABNORMAL HIGH (ref 0.00–0.50)

## 2019-06-10 LAB — LACTATE DEHYDROGENASE: LDH: 493 U/L — ABNORMAL HIGH (ref 98–192)

## 2019-06-10 LAB — MAGNESIUM: Magnesium: 2.1 mg/dL (ref 1.7–2.4)

## 2019-06-10 LAB — FERRITIN: Ferritin: 565 ng/mL — ABNORMAL HIGH (ref 11–307)

## 2019-06-10 MED ORDER — POLYETHYLENE GLYCOL 3350 17 G PO PACK
17.0000 g | PACK | Freq: Two times a day (BID) | ORAL | Status: DC
  Administered 2019-06-10 – 2019-06-11 (×2): 17 g via ORAL
  Filled 2019-06-10 (×2): qty 1

## 2019-06-10 NOTE — Progress Notes (Signed)
PROGRESS NOTE    Brenda [name]  [id number] DOB: 10/24/1930 DOA: 06/07/2019 PCP: [name], MD   Brief Narrative:  85 y.o. WF PMHx Anxiety, Hx subdural hematoma, Barrett's esophagitis, irritable bowel syndrome, severe aortic stenosis S/Pt TAVR in 11/2018, HTN, chronic diastolic CHF (EF 60 to [id number]), , Hypothyroidism,  Presents to emergency department due to fever, shortness of breath and generalized weakness.  Patient reports that she was tested positive for COVID-19 about 5 days ago after she had a previously negative test.  Reports that her symptoms are getting worse, she had fever of 102, she feels weak, lethargic and tired, has decreased appetite and worsening shortness of breath.    She denies cough, congestion, wheezing, chest pain, N/V/D, leg swelling, palpitation, urinary or bowel changes.  Patient reports that she likely got the COVID-19 infection from her husband and sister who passed away this morning due to COVID-19 and she is concerned about it.  She lives with her husband at home and denies smoking, alcohol, illicit drug use.  ED Course: Upon arrival: Patient was tachycardic, tachypneic, hypoxic and 90s-patient placed on 4 L of oxygen via nasal cannula.  X-ray shows groundglass opacities throughout both lungs suggestive of atypical/viral pneumonia.    Subjective: 11/15   A/O x4, negative CP, negative abdominal pain.  Positive S OB.  States wants to leave AMA, secondary to her husband having Leverne Humbles disease that she needs to care for him.  States believes she was affected by her family members.   Last 24 hours afebrile    Assessment & Plan:   Principal Problem:   Acute respiratory failure with hypoxia (HCC) Active Problems:   Hyperlipidemia   Anxiety   Hypertension   BARRETTS ESOPHAGUS   Irritable bowel syndrome   Hypothyroidism   Hypokalemia   Severe aortic stenosis   S/P TAVR (transcatheter aortic valve replacement)   Pneumonia due to COVID-19  virus   Acute on chronic kidney failure (HCC)   Chronic diastolic CHF (congestive heart failure) (HCC)   Hypotension   Severe sepsis (HCC)   Covid pneumonia/acute respiratory failure with hypoxia -Decadron 6 mg daily -Remdesivir per pharmacy protocol.  Last dose of remdesivir tentatively 11/16 -SARS coronavirus pending -Respiratory virus panel pending -Per Dr. Early Osmond note she discussed the need for potential use of Actemra off label.  Patient denied any history of tuberculosis or hepatitis and stated to her that she understood the risk and benefits and wanted to proceed with Actemra treatment if required. COVID-19 Labs  Recent Labs    06/07/19 1940 06/08/19 0440 06/09/19 0256 06/10/19 0212  DDIMER 3.29* 3.01* 2.30* 2.27*  FERRITIN 358* 389* 417* 565*  LDH 398*  --  423* 493*  CRP 18.8* 21.2* 14.5* 8.9*    Lab Results  Component Value Date   SARSCOV2NAA POSITIVE (A) 06/09/2019   SARSCOV2NAA NOT DETECTED 11/23/2018  -11/15  ambulatory SPO2 pending -PT/OT recommended home health.  Place face-to-face  Acute on CKD stage III(baseline Cr~1.05) Recent Labs  Lab 06/07/19 1658 06/07/19 1940 06/08/19 0440 06/09/19 0256 06/10/19 [id number]  CREATININE 1.34* 1.23* 1.20* 1.04* 0.98  -Most likely secondary to dehydration -At baseline  Chronic diastolic CHF  -Strict in and out -528.10ml -Daily weight -ASA 81 mg daily -Plavix 75 mg daily -Amlodipine 5 mg daily (hold) -Spironolactone 25 mg daily (hold) -We will hold BP medication pressure on the soft side  Severe aortic stenosis -S/p TAVR -See CHF  Essential HTN -See CHF  Hypokalemia -Potassium goal> 4  HLD -11/12 LDL= 46 -Crestor 40 mg daily  Hypothyroidism -Synthroid 75 mcg daily  Anxiety -Xanax 0.25 q.Am/0.5 mg qhs    Goals of care -11/14 PT/OT consult; home health    DVT prophylaxis: Lovenox per pharmacy Covid protocol Code Status: Full Family Communication: 11/14 spoke with Tammy (daughter)  counseled on plan of care answered all questions Disposition Plan: TBD   Consultants:    Procedures/Significant Events:     I have personally reviewed and interpreted all radiology studies and my findings are as above.  VENTILATOR SETTINGS: Nasal cannula Flow; 2 L/min SPO2; 94%   Cultures 11/12 blood 11/14 SARS coronavirus positive 11/14 respiratory virus panel pending     Antimicrobials: Anti-infectives (From admission, onward)   Start     Stop   06/08/19 1600  remdesivir 100 mg in sodium chloride 0.9 % 250 mL IVPB     06/12/19 1559   06/07/19 2030  remdesivir 200 mg in sodium chloride 0.9 % 250 mL IVPB     06/07/19 2220       Devices  LINES / TUBES:      Continuous Infusions: . remdesivir 100 mg in NS 250 mL 100 mg (06/09/19 1108)     Objective: Vitals:   06/09/19 2000 06/10/19 0000 06/10/19 0400 06/10/19 0723  BP: 117/62  131/60 116/60  Pulse:   88 64  Resp: 17  18 17   Temp: 98 F (36.7 C) 98.1 F (36.7 C) 98.3 F (36.8 C) 98 F (36.7 C)  TempSrc: Oral Oral Oral Oral  SpO2: 93%   (!) 88%  Weight:      Height:        Intake/Output Summary (Last 24 hours) at 06/10/2019 0908 Last data filed at 06/09/2019 1108 Gross per 24 hour  Intake 360 ml  Output -  Net 360 ml   Filed Weights   06/07/19 1713  Weight: 63.5 kg   Physical Exam:  General: A/O x4, positive acute respiratory distress Eyes: negative scleral hemorrhage, negative anisocoria, negative icterus ENT: Negative Runny nose, negative gingival bleeding, Neck:  Negative scars, masses, torticollis, lymphadenopathy, JVD Lungs: Clear to auscultation bilaterally without wheezes or crackles Cardiovascular: Regular rate and rhythm without murmur gallop or rub normal S1 and S2 Abdomen: negative abdominal pain, nondistended, positive soft, bowel sounds, no rebound, no ascites, no appreciable mass Extremities: No significant cyanosis, clubbing, or edema bilateral lower extremities Skin:  Negative rashes, lesions, ulcers Psychiatric:  Negative depression, negative anxiety, negative fatigue, negative mania  Central nervous system:  Cranial nerves II through XII intact, tongue/uvula midline, all extremities muscle strength 5/5, sensation intact throughout, negative dysarthria, negative expressive aphasia, negative receptive aphasia  .     Data Reviewed: Care during the described time interval was provided by me .  I have reviewed this patient's available data, including medical history, events of note, physical examination, and all test results as part of my evaluation.   CBC: Recent Labs  Lab 06/07/19 1658 06/07/19 1940 06/08/19 0116 06/08/19 0440 06/09/19 0256 06/10/19 0212  WBC 5.9 6.3  --  5.7 8.3 13.4*  NEUTROABS 4.8  --   --  5.0 7.3 12.1*  HGB 12.0 11.5* 10.5* 11.5* 10.3* 10.3*  HCT 35.5* 34.8* 31.0* 35.5* 31.5* 31.3*  MCV 90.3 90.6  --  92.0 90.8 90.7  PLT PLATELET CLUMPS NOTED ON SMEAR, UNABLE TO ESTIMATE 188  --  206 210 [id number]   Basic Metabolic Panel: Recent Labs  Lab 06/07/19 1658 06/07/19 1940 06/08/19 0116 06/08/19  [id number] 06/09/19 0256 06/10/19 0212  NA 135  --  137 138 137 140  K 3.4*  --  3.4* 3.7 3.6 4.5  CL 104  --   --  104 109 113*  CO2 18*  --   --  22 19* 20*  GLUCOSE 104*  --   --  136* 146* 108*  BUN 23  --   --  22 33* 33*  CREATININE 1.34* 1.23*  --  1.20* 1.04* 0.98  CALCIUM 7.7*  --   --  7.5* 7.5* 8.4*  MG  --   --   --  2.2 2.3 2.1  PHOS  --   --   --  4.0 2.5 2.6   GFR: Estimated Creatinine Clearance: 36.9 mL/min (by C-G formula based on SCr of 0.98 mg/dL). Liver Function Tests: Recent Labs  Lab 06/07/19 1658 06/08/19 0440 06/09/19 0256 06/10/19 0212  AST 54* 52* 54* 58*  ALT 25 24 26 27   ALKPHOS 50 48 49 59  BILITOT 0.5 0.3 0.3 0.3  PROT 6.6 6.3* 6.0* 5.7*  ALBUMIN 2.8* 2.6* 2.6* 2.5*   No results for input(s): LIPASE, AMYLASE in the last 168 hours. No results for input(s): AMMONIA in the last 168 hours.  Coagulation Profile: No results for input(s): INR, PROTIME in the last 168 hours. Cardiac Enzymes: No results for input(s): CKTOTAL, CKMB, CKMBINDEX, TROPONINI in the last 168 hours. BNP (last 3 results) No results for input(s): PROBNP in the last 8760 hours. HbA1C: No results for input(s): HGBA1C in the last 72 hours. CBG: No results for input(s): GLUCAP in the last 168 hours. Lipid Profile: Recent Labs    06/07/19 1940  CHOL 95  HDL 33*  LDLCALC 46  TRIG 78  CHOLHDL 2.9   Thyroid Function Tests: Recent Labs    06/07/19 1718  TSH 1.119   Anemia Panel: Recent Labs    06/09/19 0256 06/10/19 0212  FERRITIN 417* 565*   Urine analysis:    Component Value Date/Time   COLORURINE YELLOW 11/23/2018 1054   APPEARANCEUR CLEAR 11/23/2018 1054   LABSPEC 1.011 11/23/2018 1054   PHURINE 5.0 11/23/2018 1054   GLUCOSEU NEGATIVE 11/23/2018 1054   HGBUR SMALL (A) 11/23/2018 1054   BILIRUBINUR NEGATIVE 11/23/2018 1054   KETONESUR NEGATIVE 11/23/2018 1054   PROTEINUR NEGATIVE 11/23/2018 1054   UROBILINOGEN 1.0 01/16/2014 1036   NITRITE NEGATIVE 11/23/2018 1054   LEUKOCYTESUR NEGATIVE 11/23/2018 1054   Sepsis Labs: @LABRCNTIP (procalcitonin:4,lacticidven:4)  ) Recent Results (from the past 240 hour(s))  Blood culture (routine x 2)     Status: None (Preliminary result)   Collection Time: 06/07/19  4:32 PM   Specimen: BLOOD  Result Value Ref Range Status   Specimen Description BLOOD LEFT ANTECUBITAL  Final   Special Requests   Final    BOTTLES DRAWN AEROBIC AND ANAEROBIC Blood Culture adequate volume   Culture   Final    NO GROWTH 2 DAYS Performed at [facility], [place] [address] [place], [place] [address]    Report Status PENDING  Incomplete  Blood culture (routine x 2)     Status: None (Preliminary result)   Collection Time: 06/07/19  5:08 PM   Specimen: BLOOD  Result Value Ref Range Status   Specimen Description BLOOD RIGHT ANTECUBITAL  Final   Special  Requests   Final    BOTTLES DRAWN AEROBIC AND ANAEROBIC Blood Culture adequate volume   Culture   Final    NO GROWTH 2 DAYS  Performed at [facility], [place] [address] [place], [place] [address]    Report Status PENDING  Incomplete  SARS CORONAVIRUS 2 (TAT 6-24 HRS) Nasopharyngeal Nasopharyngeal Swab     Status: Abnormal   Collection Time: 06/09/19  7:47 AM   Specimen: Nasopharyngeal Swab  Result Value Ref Range Status   SARS Coronavirus 2 POSITIVE (A) NEGATIVE Final    Comment: RESULT CALLED TO, READ BACK BY AND VERIFIED WITH: RN H SHUMAKER AT 0732 06/10/2019 BY L BENFIELD (NOTE) SARS-CoV-2 target nucleic acids are DETECTED. The SARS-CoV-2 RNA is generally detectable in upper and lower respiratory specimens during the acute phase of infection. Positive results are indicative of active infection with SARS-CoV-2. Clinical  correlation with patient history and other diagnostic information is necessary to determine patient infection status. Positive results do  not rule out bacterial infection or co-infection with other viruses. The expected result is Negative. Fact Sheet for Patients: SugarRoll.be Fact Sheet for Healthcare Providers: https://www.Anedra Penafiel-mathews.com/ This test is not yet approved or cleared by the [place] FDA and  has been authorized for detection and/or diagnosis of SARS-CoV-2 by FDA under an Emergency Use Authorization (EUA). This EUA will remain  in effect (meaning this test can be  used) for the duration of the COVID-19 declaration under Section 564(b)(1) of the Act, 21 U.S.C. section 360bbb-3(b)(1), unless the authorization is terminated or revoked sooner. Performed at [facility], [place] [address], [place],  [address]          Radiology Studies: No results found.      Scheduled Meds: . ALPRAZolam  0.25 mg Oral Q0600  . ALPRAZolam  0.5 mg Oral QHS  . aspirin  81 mg Oral Daily  .  cholecalciferol  5,000 Units Oral QHS  . clopidogrel  75 mg Oral Q breakfast  . cycloSPORINE  1 drop Both Eyes Q12H  . dexamethasone  6 mg Oral Daily  . enoxaparin (LOVENOX) injection  30 mg Subcutaneous Q24H  . levothyroxine  75 mcg Oral Q0600  . multivitamin with minerals  1 tablet Oral Daily  . rosuvastatin  40 mg Oral Daily  . senna-docusate  3 tablet Oral QHS  . vitamin B-12  1,000 mcg Oral QHS  . vitamin C  500 mg Oral Daily  . zinc sulfate  220 mg Oral Daily   Continuous Infusions: . remdesivir 100 mg in NS 250 mL 100 mg (06/09/19 1108)     LOS: 3 days   The patient is critically ill with multiple organ systems failure and requires high complexity decision making for assessment and support, frequent evaluation and titration of therapies, application of advanced monitoring technologies and extensive interpretation of multiple databases. Critical Care Time devoted to patient care services described in this note  Time spent: 40 minutes     Samera Macy, Geraldo Docker, MD [facility] Pager [phone]  If 7PM-7AM, please contact night-coverage [email or web address] Password [email or web address] 06/10/2019, 9:08 AM

## 2019-06-10 NOTE — Progress Notes (Addendum)
0730: Assumed care of Pt from night RN. Pt assessed, denies pain, VSS, SpO2 90% on 2 L [place]. Assisted Pt to [facility] then up to chair. Educated and encouraged pt to remain up to chair for as long as tolerable and to perform coughing deep breathing, IS and flutter. Reminded to call for assistance. Call bell within reach  1300: Pt assisted back to bed. Denies other needs at this time, call bell within reach  1340: Daughter updated via phone. All questions answered  1445: Notified MD that Pt is c/o constipation. Request for miralax. No new orders at this time  1550: Reassessed, denies pain. Assisted to [facility]. Completed ambulation on/off oxygen then assisted back to bed. Pt tolerated well, denies further needs at this time, call bell within reach.   1840: Pt resting comfortably ion bed, call bell within reach

## 2019-06-10 NOTE — Progress Notes (Signed)
SATURATION QUALIFICATIONS:  Patient Saturations on Room Air at Rest = 90%  Patient Saturations on Room Air while Ambulating = 86%  Patient Saturations on 2 Liters of oxygen while Ambulating = 90%  Please briefly explain why patient needs home oxygen: Pt requires oxygen in order to maintain O2 sat's >88%. Pt becomes SOB with exertion therefore requires more oxygen with activity.

## 2019-06-11 DIAGNOSIS — N1831 Chronic kidney disease, stage 3a: Secondary | ICD-10-CM

## 2019-06-11 DIAGNOSIS — E038 Other specified hypothyroidism: Secondary | ICD-10-CM

## 2019-06-11 DIAGNOSIS — N170 Acute kidney failure with tubular necrosis: Secondary | ICD-10-CM

## 2019-06-11 LAB — CBC WITH DIFFERENTIAL/PLATELET
Abs Immature Granulocytes: 0.26 10*3/uL — ABNORMAL HIGH (ref 0.00–0.07)
Basophils Absolute: 0 10*3/uL (ref 0.0–0.1)
Basophils Relative: 0 %
Eosinophils Absolute: 0 10*3/uL (ref 0.0–0.5)
Eosinophils Relative: 0 %
HCT: 33.3 % — ABNORMAL LOW (ref 36.0–46.0)
Hemoglobin: 11 g/dL — ABNORMAL LOW (ref 12.0–15.0)
Immature Granulocytes: 2 %
Lymphocytes Relative: 9 %
Lymphs Abs: 1 10*3/uL (ref 0.7–4.0)
MCH: 29.7 pg (ref 26.0–34.0)
MCHC: 33 g/dL (ref 30.0–36.0)
MCV: 90 fL (ref 80.0–100.0)
Monocytes Absolute: 0.9 10*3/uL (ref 0.1–1.0)
Monocytes Relative: 8 %
Neutro Abs: 9.3 10*3/uL — ABNORMAL HIGH (ref 1.7–7.7)
Neutrophils Relative %: 81 %
Platelets: 255 10*3/uL (ref 150–400)
RBC: 3.7 MIL/uL — ABNORMAL LOW (ref 3.87–5.11)
RDW: 14 % (ref 11.5–15.5)
WBC: 11.5 10*3/uL — ABNORMAL HIGH (ref 4.0–10.5)
nRBC: 0 % (ref 0.0–0.2)

## 2019-06-11 LAB — PHOSPHORUS: Phosphorus: 2.6 mg/dL (ref 2.5–4.6)

## 2019-06-11 LAB — COMPREHENSIVE METABOLIC PANEL
ALT: 29 U/L (ref 0–44)
AST: 64 U/L — ABNORMAL HIGH (ref 15–41)
Albumin: 2.7 g/dL — ABNORMAL LOW (ref 3.5–5.0)
Alkaline Phosphatase: 60 U/L (ref 38–126)
Anion gap: 9 (ref 5–15)
BUN: 31 mg/dL — ABNORMAL HIGH (ref 8–23)
CO2: 22 mmol/L (ref 22–32)
Calcium: 8.2 mg/dL — ABNORMAL LOW (ref 8.9–10.3)
Chloride: 110 mmol/L (ref 98–111)
Creatinine, Ser: 1 mg/dL (ref 0.44–1.00)
GFR calc Af Amer: 59 mL/min — ABNORMAL LOW (ref >60)
GFR calc non Af Amer: 51 mL/min — ABNORMAL LOW (ref >60)
Glucose, Bld: 79 mg/dL (ref 70–99)
Potassium: 4.2 mmol/L (ref 3.5–5.1)
Sodium: 141 mmol/L (ref 135–145)
Total Bilirubin: 0.4 mg/dL (ref 0.3–1.2)
Total Protein: 5.9 g/dL — ABNORMAL LOW (ref 6.5–8.1)

## 2019-06-11 LAB — LACTATE DEHYDROGENASE: LDH: 523 U/L — ABNORMAL HIGH (ref 98–192)

## 2019-06-11 LAB — FERRITIN: Ferritin: 512 ng/mL — ABNORMAL HIGH (ref 11–307)

## 2019-06-11 LAB — C-REACTIVE PROTEIN: CRP: 5.9 mg/dL — ABNORMAL HIGH (ref <1.0)

## 2019-06-11 LAB — D-DIMER, QUANTITATIVE: D-Dimer, Quant: 2.33 ug/mL-FEU — ABNORMAL HIGH (ref 0.00–0.50)

## 2019-06-11 LAB — MAGNESIUM: Magnesium: 2.1 mg/dL (ref 1.7–2.4)

## 2019-06-11 MED ORDER — ASCORBIC ACID 500 MG PO TABS: 500.0000 mg | ORAL_TABLET | Freq: Every day | ORAL | 0 refills | Status: AC

## 2019-06-11 MED ORDER — ZINC SULFATE 220 (50 ZN) MG PO CAPS: 220.0000 mg | ORAL_CAPSULE | Freq: Every day | ORAL | 0 refills | Status: AC

## 2019-06-11 MED ORDER — DEXAMETHASONE 6 MG PO TABS: 6.0000 mg | ORAL_TABLET | Freq: Every day | ORAL | 0 refills | Status: AC

## 2019-06-11 MED ORDER — ALBUTEROL SULFATE HFA 108 (90 BASE) MCG/ACT IN AERS: 2.0000 | INHALATION_SPRAY | Freq: Four times a day (QID) | RESPIRATORY_TRACT | 0 refills | Status: DC | PRN

## 2019-06-11 NOTE — Plan of Care (Signed)
Plan of care reviewed and discussed with the patient. 

## 2019-06-11 NOTE — Discharge Summary (Signed)
Physician Discharge Summary  KIORA SHAFER [id number] DOB: 1931/02/09 DOA: 06/07/2019  PCP: [name], MD  Admit date: 06/07/2019 Discharge date: 06/11/2019  Time spent: 30 minutes  Recommendations for Outpatient Follow-up: Covid pneumonia/acute respiratory failure with hypoxia -Decadron 6 mg daily -Remdesivir per pharmacy protocol.  Last dose of remdesivir tentatively 11/16 -Per Dr. Early Osmond note she discussed the need for potential use of Actemra off label.  Patient denied any history of tuberculosis or hepatitis and stated to her that she understood the risk and benefits and wanted to proceed with Actemra treatment if required. COVID-19 Labs  Recent Labs    06/09/19 0256 06/10/19 0212 06/11/19 0334  DDIMER 2.30* 2.27* 2.33*  FERRITIN 417* 565* 512*  LDH 423* 493* 523*  CRP 14.5* 8.9* 5.9*    Lab Results  Component Value Date   SARSCOV2NAA POSITIVE (A) 06/09/2019   SARSCOV2NAA NOT DETECTED 11/23/2018  -PT/OT recommended home health.  Placed face-to-face SATURATION QUALIFICATIONS: Patient Saturations on Room Air at Rest = 90% Patient Saturations on Room Air while Ambulating = 86% Patient Saturations on 2 Liters of oxygen while Ambulating = 90% Please briefly explain why patient needs home oxygen: Pt requires oxygen in order to maintain O2 sat's >88%. Pt becomes SOB with exertion therefore requires more oxygen with activity -Patient qualifies for home O2 -Patient to go home on 2 L O2 via [place], titrate O2 to maintain SPO2> 88% -Provide Inogen portable O2 concentrator -Spoke with patient and daughter Lynelle Smoke and counseled them that post discharge for the next 20 days patient would be considered contagious and to use universal quarantine procedures i.e. wear mask, stay away from immunocompromised people, wipe down surfaces often with Clorox, wash hands often.  Acute on CKD stage III(baseline Cr~1.05)  Recent Labs  Lab 06/07/19 1940 06/08/19 0440 06/09/19 0256  06/10/19 0212 06/11/19 0334  CREATININE 1.23* 1.20* 1.04* 0.98 1.00  -Most likely secondary to dehydration -Better than baseline  Chronic diastolic CHF  -Strict in and out  -1.23 L -Daily weight Filed Weights   06/07/19 1713  Weight: 63.5 kg  -ASA 81 mg daily -Plavix 75 mg daily -Amlodipine 5 mg daily (hold) -Spironolactone 25 mg daily (hold) -PCP to determine when/if to restart BP medication.  Patient's BP currently controlled off meds  Severe aortic stenosis -S/p TAVR -See CHF  Essential HTN -See CHF  Hypokalemia -Potassium goal> 4  HLD -11/12 LDL= 46 -Crestor 40 mg daily  Hypothyroidism -Synthroid 75 mcg daily  Anxiety -Xanax 0.25 q.Am/0.5 mg qhs     Discharge Diagnoses:  Principal Problem:   Acute respiratory failure with hypoxia (HCC) Active Problems:   Hyperlipidemia   Anxiety   Hypertension   BARRETTS ESOPHAGUS   Irritable bowel syndrome   Hypothyroidism   Hypokalemia   Severe aortic stenosis   S/P TAVR (transcatheter aortic valve replacement)   Pneumonia due to COVID-19 virus   Acute on chronic kidney failure (HCC)   Chronic diastolic CHF (congestive heart failure) (HCC)   Hypotension   Severe sepsis ([place])   Discharge Condition: Stable  Diet recommendation: Heart healthy  Filed Weights   06/07/19 1713  Weight: 63.5 kg    History of present illness:  85 [name] PMHx Anxiety, Hx subdural hematoma, Barrett's esophagitis, irritable bowel syndrome, severe aortic stenosis S/Pt TAVR in 11/2018, HTN, chronic diastolic CHF (EF 60 to [id number]), , Hypothyroidism,  Presents to emergency department due to fever, shortness of breath and generalized weakness. Patient reports that she was tested positive for COVID-19  about 5 days ago after she had a previously negative test. Reports that her symptoms are getting worse, she had fever of 102, she feels weak, lethargic and tired, has decreased appetite and worsening shortness of breath.   She  denies cough, congestion, wheezing, chest pain, N/V/D, leg swelling, palpitation, urinary or bowel changes.  Patient reports that she likely got the COVID-19 infection from her husband and sister who passed away this morning due to COVID-19 and she is concerned about it.  She lives with her husband at home and denies smoking, alcohol, illicit drug use.  Hospital Course:  During his hospitalization patient was treated for Covid pneumonia per protocol patient has responded well and is stable for discharge.     Cultures  11/12 blood 11/14 SARS coronavirus positive   Antibiotics Anti-infectives (From admission, onward)   Start     Stop   06/08/19 1600  remdesivir 100 mg in sodium chloride 0.9 % 250 mL IVPB     06/12/19 0759   06/07/19 2030  remdesivir 200 mg in sodium chloride 0.9 % 250 mL IVPB     06/07/19 2220       Discharge Exam: Vitals:   06/10/19 1529 06/10/19 1945 06/11/19 0649 06/11/19 0743  BP: 132/62 137/63  128/79  Pulse: 61 64  63  Resp: 18 18  20   Temp: 97.9 F (36.6 C) 97.8 F (36.6 C) 97.8 F (36.6 C) 97.7 F (36.5 C)  TempSrc: Oral Oral  Oral  SpO2: 93% 94%  91%  Weight:      Height:        General: A/O x4, positive acute respiratory distress Eyes: negative scleral hemorrhage, negative anisocoria, negative icterus ENT: Negative Runny nose, negative gingival bleeding, Neck:  Negative scars, masses, torticollis, lymphadenopathy, JVD Lungs: Clear to auscultation bilaterally without wheezes or crackles Cardiovascular: Regular rate and rhythm without murmur gallop or rub normal S1 and S2  Discharge Instructions   Allergies as of 06/11/2019   No Known Allergies     Medication List    STOP taking these medications   amLODipine 5 MG tablet Commonly known as: NORVASC   diphenhydramine-acetaminophen 25-500 MG Tabs tablet Commonly known as: TYLENOL PM   spironolactone 25 MG tablet Commonly known as: ALDACTONE     TAKE these medications    acetaminophen 650 MG CR tablet Commonly known as: TYLENOL Take 650 mg by mouth daily.   albuterol 108 (90 Base) MCG/ACT inhaler Commonly known as: VENTOLIN HFA Inhale 2 puffs into the lungs every 6 (six) hours as needed for wheezing or shortness of breath.   ALPRAZolam 0.5 MG tablet Commonly known as: XANAX Take 0.5 mg by mouth See admin instructions. Take 0.5 mg in the morning and 1 mg at night   ascorbic acid 500 MG tablet Commonly known as: VITAMIN C Take 1 tablet (500 mg total) by mouth daily.   aspirin 81 MG chewable tablet Chew 1 tablet (81 mg total) by mouth daily.   Biotin 5000 MCG Tabs Take 5,000 mcg by mouth at bedtime.   BLUE-EMU MAXIMUM STRENGTH EX Apply 1 application topically daily as needed (knee pain).   clopidogrel 75 MG tablet Commonly known as: PLAVIX Take 1 tablet (75 mg total) by mouth daily with breakfast.   cycloSPORINE 0.05 % ophthalmic emulsion Commonly known as: RESTASIS Place 1 drop into both eyes every 12 (twelve) hours.   dexamethasone 6 MG tablet Commonly known as: DECADRON Take 1 tablet (6 mg total) by mouth daily for  5 days.   diclofenac sodium 1 % Gel Commonly known as: VOLTAREN Apply 2 g topically daily. To affected joint.   FLONASE ALLERGY RELIEF NA Place 1 spray into the nose daily.   GLUCOSAMINE-CHONDROITIN PO Take 1 capsule by mouth 3 (three) times daily.   levothyroxine 75 MCG tablet Commonly known as: SYNTHROID Take 75 mcg by mouth daily.   Multivitamin Adult Chew Chew 2 each by mouth daily.   rosuvastatin 40 MG tablet Commonly known as: CRESTOR TAKE 1 TABLET (40 MG TOTAL) BY MOUTH DAILY. What changed:   how much to take  how to take this  when to take this  additional instructions   SALONPAS PAIN RELIEF PATCH EX Apply 1 patch topically daily.   Senna S 8.6-50 MG tablet Generic drug: senna-docusate Take 3 tablets by mouth at bedtime.   vitamin B-12 1000 MCG tablet Commonly known as:  CYANOCOBALAMIN Take 1,000 mcg by mouth at bedtime.   Vitamin D 125 MCG (5000 UT) Caps Take 5,000 Units by mouth at bedtime.   zinc sulfate 220 (50 Zn) MG capsule Take 1 capsule (220 mg total) by mouth daily.            Durable Medical Equipment  (From admission, onward)         Start     Ordered   06/11/19 0738  For home use only DME oxygen  Once    Comments: SATURATION QUALIFICATIONS: Patient Saturations on Room Air at Rest = 90% Patient Saturations on Room Air while Ambulating = 86% Patient Saturations on 2 Liters of oxygen while Ambulating = 90% Please briefly explain why patient needs home oxygen: Pt requires oxygen in order to maintain O2 sat's >88%. Pt becomes SOB with exertion therefore requires more oxygen with activity -Patient qualifies for home O2 -Patient to go home on 2 L O2 via [place], titrate O2 to maintain SPO2> 88% -Provide Inogen portable O2 concentrator  Question Answer Comment  Length of Need 6 Months   Mode or (Route) Nasal cannula   Liters per Minute 2   Frequency Continuous (stationary and portable oxygen unit needed)   Oxygen conserving device Yes   Oxygen delivery system Gas      06/11/19 0737         No Known Allergies Follow-up Information    [facility], [place] Follow up.   Specialty: [place] Why: A representative from [facility] will contact you to arrange start date and time for your therapy. Contact information: [place] [address] [phone]            The results of significant diagnostics from this hospitalization (including imaging, microbiology, ancillary and laboratory) are listed below for reference.    Significant Diagnostic Studies: Dg Chest Portable 1 View  Result Date: 06/07/2019 CLINICAL DATA:  COVID-19 positive, hypoxia EXAM: PORTABLE CHEST 1 VIEW COMPARISON:  11/28/2018 FINDINGS: The heart size and mediastinal contours are stable. Postsurgical changes of  TAVR. Calcific aortic knob. There are mild diffuse ground-glass opacities throughout both lungs in a predominantly peripheral distribution. Chronic left 4 and 5 rib fractures. IMPRESSION: Diffuse ground-glass opacities throughout both lungs in a predominantly peripheral distribution. Findings suggestive of atypical/viral pneumonia given the patient's history. Electronically Signed   By: Davina Poke M.D.   On: 06/07/2019 17:38    Microbiology: Recent Results (from the past 240 hour(s))  Blood culture (routine x 2)     Status: None (Preliminary result)  Collection Time: 06/07/19  4:32 PM   Specimen: BLOOD  Result Value Ref Range Status   Specimen Description BLOOD LEFT ANTECUBITAL  Final   Special Requests   Final    BOTTLES DRAWN AEROBIC AND ANAEROBIC Blood Culture adequate volume   Culture   Final    NO GROWTH 4 DAYS Performed at [facility], [place] [address] [place], [place] [address]    Report Status PENDING  Incomplete  Blood culture (routine x 2)     Status: None (Preliminary result)   Collection Time: 06/07/19  5:08 PM   Specimen: BLOOD  Result Value Ref Range Status   Specimen Description BLOOD RIGHT ANTECUBITAL  Final   Special Requests   Final    BOTTLES DRAWN AEROBIC AND ANAEROBIC Blood Culture adequate volume   Culture   Final    NO GROWTH 4 DAYS Performed at [facility], [place] [address] [place], [place] [address]    Report Status PENDING  Incomplete  SARS CORONAVIRUS 2 (TAT 6-24 HRS) Nasopharyngeal Nasopharyngeal Swab     Status: Abnormal   Collection Time: 06/09/19  7:47 AM   Specimen: Nasopharyngeal Swab  Result Value Ref Range Status   SARS Coronavirus 2 POSITIVE (A) NEGATIVE Final    Comment: RESULT CALLED TO, READ BACK BY AND VERIFIED WITH: RN H SHUMAKER AT 0732 06/10/2019 BY L BENFIELD (NOTE) SARS-CoV-2 target nucleic acids are DETECTED. The SARS-CoV-2 RNA is generally detectable in upper and lower respiratory specimens during the acute phase  of infection. Positive results are indicative of active infection with SARS-CoV-2. Clinical  correlation with patient history and other diagnostic information is necessary to determine patient infection status. Positive results do  not rule out bacterial infection or co-infection with other viruses. The expected result is Negative. Fact Sheet for Patients: SugarRoll.be Fact Sheet for Healthcare Providers: https://www.woods-mathews.com/ This test is not yet approved or cleared by the [place] FDA and  has been authorized for detection and/or diagnosis of SARS-CoV-2 by FDA under an Emergency Use Authorization (EUA). This EUA will remain  in effect (meaning this test can be  used) for the duration of the COVID-19 declaration under Section 564(b)(1) of the Act, 21 U.S.C. section 360bbb-3(b)(1), unless the authorization is terminated or revoked sooner. Performed at [facility], [place] [address], [place], [place] [address]      Labs: Basic Metabolic Panel: Recent Labs  Lab 06/07/19 1658 06/07/19 1940 06/08/19 0116 06/08/19 0440 06/09/19 0256 06/10/19 0212 06/11/19 0334  NA 135  --  137 138 137 140 141  K 3.4*  --  3.4* 3.7 3.6 4.5 4.2  CL 104  --   --  104 109 113* 110  CO2 18*  --   --  22 19* 20* 22  GLUCOSE 104*  --   --  136* 146* 108* 79  BUN 23  --   --  22 33* 33* 31*  CREATININE 1.34* 1.23*  --  1.20* 1.04* 0.98 1.00  CALCIUM 7.7*  --   --  7.5* 7.5* 8.4* 8.2*  MG  --   --   --  2.2 2.3 2.1 2.1  PHOS  --   --   --  4.0 2.5 2.6 2.6   Liver Function Tests: Recent Labs  Lab 06/07/19 1658 06/08/19 0440 06/09/19 0256 06/10/19 0212 06/11/19 0334  AST 54* 52* 54* 58* 64*  ALT 25 24 26 27 29   ALKPHOS 50 48 49 59 60  BILITOT 0.5 0.3 0.3 0.3 0.4  PROT 6.6 6.3* 6.0* 5.7* 5.9*  ALBUMIN 2.8* 2.6* 2.6* 2.5* 2.7*   No results for input(s): LIPASE, AMYLASE in the last 168 hours. No results for input(s): AMMONIA in the  last 168 hours. CBC: Recent Labs  Lab 06/07/19 1658 06/07/19 1940 06/08/19 0116 06/08/19 0440 06/09/19 0256 06/10/19 0212 06/11/19 0334  WBC 5.9 6.3  --  5.7 8.3 13.4* 11.5*  NEUTROABS 4.8  --   --  5.0 7.3 12.1* 9.3*  HGB 12.0 11.5* 10.5* 11.5* 10.3* 10.3* 11.0*  HCT 35.5* 34.8* 31.0* 35.5* 31.5* 31.3* 33.3*  MCV 90.3 90.6  --  92.0 90.8 90.7 90.0  PLT PLATELET CLUMPS NOTED ON SMEAR, UNABLE TO ESTIMATE 188  --  206 210 240 255   Cardiac Enzymes: No results for input(s): CKTOTAL, CKMB, CKMBINDEX, TROPONINI in the last 168 hours. BNP: BNP (last 3 results) Recent Labs    11/23/18 1130 06/07/19 1719 06/09/19 0256  BNP 37.4 87.3 192.0*    ProBNP (last 3 results) No results for input(s): PROBNP in the last 8760 hours.  CBG: No results for input(s): GLUCAP in the last 168 hours.     Signed:  Dia Crawford, MD [facility] [phone] pager

## 2019-06-11 NOTE — Progress Notes (Addendum)
0730: Assumed care of Pt from night RN.   0800: Pt alert, denies pain, VS WNL. Pt excited to be discharged today. Assisted to [facility] and back to bed. Call bell within reach  1200: Daughter called and updated on Pt discharge for today. Instructed to call RN once home O2 gets delivered. Daughter stated she wanted a call and update from the Dr before the Pt gets discharged. MD made aware.  1350: Pt assisted with dressing. PIV removed. ALL discharge paperwork and education complete. Awaiting arrival of daughter.

## 2019-06-11 NOTE — TOC Progression Note (Signed)
Transition of Care [facility]) - Progression Note    Patient Details  Name: GWENETTA KRIEBEL MRN: [id number] Date of Birth: [birth year]  [facility] of [facility]) CM/SW Contact  Joaquin Courts, RN Phone Number: 06/11/2019, 10:54 AM  Clinical Narrative:    Patient set up with [facility] for home oxygen needs. [employer] to deliver concentrator to home, [facility] notified and will deliver portable 2 to bedside for discharge today. CM spoke with patient's daughter and notified her of this and to expect a call from [place] to  Schedule delivery.     Expected Discharge Plan: [place] Barriers to Discharge: No Barriers Identified  Expected Discharge Plan and Services Expected Discharge Plan: [place]   Discharge Planning Services: CM Consult   Living arrangements for the past 2 months: Single Family Home Expected Discharge Date: 06/11/19               DME Arranged: Oxygen DME Agency: [place] Date DME Agency Contacted: 06/11/19 Time DME Agency Contacted: [id number] Representative spoke with at DME Agency: Jeneen Rinks HH Arranged: NA [place] Agency: NA         Social Determinants of Health ([place]) Interventions    Readmission Risk Interventions Readmission Risk Prevention Plan 11/29/2018  Post Dischage Appt Complete  Medication Screening Complete  Transportation Screening Complete  Some recent data might be hidden

## 2019-06-12 LAB — CULTURE, BLOOD (ROUTINE X 2)
Culture: NO GROWTH
Culture: NO GROWTH
Special Requests: ADEQUATE
Special Requests: ADEQUATE

## 2019-06-13 ENCOUNTER — Telehealth: Payer: Self-pay | Admitting: Internal Medicine

## 2019-06-13 DIAGNOSIS — M47812 Spondylosis without myelopathy or radiculopathy, cervical region: Secondary | ICD-10-CM | POA: Diagnosis not present

## 2019-06-13 DIAGNOSIS — U071 COVID-19: Secondary | ICD-10-CM | POA: Diagnosis not present

## 2019-06-13 DIAGNOSIS — I959 Hypotension, unspecified: Secondary | ICD-10-CM | POA: Diagnosis not present

## 2019-06-13 DIAGNOSIS — Z7952 Long term (current) use of systemic steroids: Secondary | ICD-10-CM | POA: Diagnosis not present

## 2019-06-13 DIAGNOSIS — N183 Chronic kidney disease, stage 3 unspecified: Secondary | ICD-10-CM | POA: Diagnosis not present

## 2019-06-13 DIAGNOSIS — I5032 Chronic diastolic (congestive) heart failure: Secondary | ICD-10-CM | POA: Diagnosis not present

## 2019-06-13 DIAGNOSIS — E039 Hypothyroidism, unspecified: Secondary | ICD-10-CM | POA: Diagnosis not present

## 2019-06-13 DIAGNOSIS — M47818 Spondylosis without myelopathy or radiculopathy, sacral and sacrococcygeal region: Secondary | ICD-10-CM | POA: Diagnosis not present

## 2019-06-13 DIAGNOSIS — J9601 Acute respiratory failure with hypoxia: Secondary | ICD-10-CM | POA: Diagnosis not present

## 2019-06-13 DIAGNOSIS — M47817 Spondylosis without myelopathy or radiculopathy, lumbosacral region: Secondary | ICD-10-CM | POA: Diagnosis not present

## 2019-06-13 DIAGNOSIS — N179 Acute kidney failure, unspecified: Secondary | ICD-10-CM | POA: Diagnosis not present

## 2019-06-13 DIAGNOSIS — K2270 Barrett's esophagus without dysplasia: Secondary | ICD-10-CM | POA: Diagnosis not present

## 2019-06-13 DIAGNOSIS — F419 Anxiety disorder, unspecified: Secondary | ICD-10-CM | POA: Diagnosis not present

## 2019-06-13 DIAGNOSIS — E785 Hyperlipidemia, unspecified: Secondary | ICD-10-CM | POA: Diagnosis not present

## 2019-06-13 DIAGNOSIS — Z952 Presence of prosthetic heart valve: Secondary | ICD-10-CM | POA: Diagnosis not present

## 2019-06-13 DIAGNOSIS — M47816 Spondylosis without myelopathy or radiculopathy, lumbar region: Secondary | ICD-10-CM | POA: Diagnosis not present

## 2019-06-13 DIAGNOSIS — I130 Hypertensive heart and chronic kidney disease with heart failure and stage 1 through stage 4 chronic kidney disease, or unspecified chronic kidney disease: Secondary | ICD-10-CM | POA: Diagnosis not present

## 2019-06-13 DIAGNOSIS — J1289 Other viral pneumonia: Secondary | ICD-10-CM | POA: Diagnosis not present

## 2019-06-13 DIAGNOSIS — Z7902 Long term (current) use of antithrombotics/antiplatelets: Secondary | ICD-10-CM | POA: Diagnosis not present

## 2019-06-13 DIAGNOSIS — Z7982 Long term (current) use of aspirin: Secondary | ICD-10-CM | POA: Diagnosis not present

## 2019-06-13 DIAGNOSIS — Z981 Arthrodesis status: Secondary | ICD-10-CM | POA: Diagnosis not present

## 2019-06-13 DIAGNOSIS — M8448XD Pathological fracture, other site, subsequent encounter for fracture with routine healing: Secondary | ICD-10-CM | POA: Diagnosis not present

## 2019-06-13 DIAGNOSIS — K589 Irritable bowel syndrome without diarrhea: Secondary | ICD-10-CM | POA: Diagnosis not present

## 2019-06-13 DIAGNOSIS — E876 Hypokalemia: Secondary | ICD-10-CM | POA: Diagnosis not present

## 2019-06-13 DIAGNOSIS — Z79899 Other long term (current) drug therapy: Secondary | ICD-10-CM | POA: Diagnosis not present

## 2019-06-13 NOTE — Telephone Encounter (Signed)
Spoke to patient's daughter, Lynelle Smoke.  Pt stopped clopidogrel on November 5 as instructed, 6 months after her surgery.  When meds were picked up for patient at pharmacy, this was included.  She wanted to make sure plavix was removed from medicine list now. Plavix removed from medication list per notes from K. [place] Silos, PA-C. Pt stops Plavix 6 months after TAVR surgery.

## 2019-06-13 NOTE — Telephone Encounter (Signed)
Pt's daughter Lynelle Smoke calling stating that the pt is not suppose to still be taking Clopidogrel anymore and daughter would like a call concerning this matter, because this medication is still on pt's medication list. Please address

## 2019-06-20 ENCOUNTER — Other Ambulatory Visit: Payer: Self-pay | Admitting: Internal Medicine

## 2019-06-27 DIAGNOSIS — I10 Essential (primary) hypertension: Secondary | ICD-10-CM | POA: Diagnosis not present

## 2019-06-27 DIAGNOSIS — D649 Anemia, unspecified: Secondary | ICD-10-CM | POA: Diagnosis not present

## 2019-06-27 DIAGNOSIS — Z09 Encounter for follow-up examination after completed treatment for conditions other than malignant neoplasm: Secondary | ICD-10-CM | POA: Diagnosis not present

## 2019-06-27 DIAGNOSIS — Z8619 Personal history of other infectious and parasitic diseases: Secondary | ICD-10-CM | POA: Diagnosis not present

## 2019-06-27 DIAGNOSIS — R748 Abnormal levels of other serum enzymes: Secondary | ICD-10-CM | POA: Diagnosis not present

## 2019-06-27 DIAGNOSIS — E8809 Other disorders of plasma-protein metabolism, not elsewhere classified: Secondary | ICD-10-CM | POA: Diagnosis not present

## 2019-06-27 DIAGNOSIS — I509 Heart failure, unspecified: Secondary | ICD-10-CM | POA: Diagnosis not present

## 2019-06-27 DIAGNOSIS — J9601 Acute respiratory failure with hypoxia: Secondary | ICD-10-CM | POA: Diagnosis not present

## 2019-06-27 DIAGNOSIS — E876 Hypokalemia: Secondary | ICD-10-CM | POA: Diagnosis not present

## 2019-07-19 ENCOUNTER — Institutional Professional Consult (permissible substitution): Payer: Medicare Other | Admitting: Internal Medicine

## 2019-07-25 ENCOUNTER — Ambulatory Visit (INDEPENDENT_AMBULATORY_CARE_PROVIDER_SITE_OTHER): Payer: Medicare Other | Admitting: Pulmonary Disease

## 2019-07-25 ENCOUNTER — Encounter: Payer: Self-pay | Admitting: Pulmonary Disease

## 2019-07-25 ENCOUNTER — Other Ambulatory Visit: Payer: Self-pay

## 2019-07-25 VITALS — BP 132/72 | HR 81 | Temp 97.3°F | Ht 63.0 in | Wt 140.4 lb

## 2019-07-25 DIAGNOSIS — Z23 Encounter for immunization: Secondary | ICD-10-CM

## 2019-07-25 DIAGNOSIS — R0600 Dyspnea, unspecified: Secondary | ICD-10-CM | POA: Diagnosis not present

## 2019-07-25 NOTE — Patient Instructions (Addendum)
Recent Covid pneumonia Completed remdesivir and Decadron  Chest x-ray not showing complete clearance-right lower lobe haziness  You do not need antibiotics at present-stop antibiotics  Will repeat your x-ray in about 2 weeks  We will administer your flu shot today  Call us if you are having any symptoms  Tentatively, I will see you in about 4 weeks-this may be rescheduled if you are feeling great   Chest x-ray changes from pneumonia/any type of pneumonia does take up to 6 to 8 weeks to clear up completely  As long as you continue to feel well, things should continue to improve

## 2019-07-25 NOTE — Progress Notes (Signed)
Subjective:    Patient ID: Brenda [name], female    DOB: 05-20-1931, 85 y.o.   MRN: [id number]  Patient seen for abnormal chest x-ray  She was recently hospitalized for Covid Had a chest x-ray in the hospital showing bilateral infiltration Did follow-up with her primary doctor and x-ray at that time did reveal right lower lobe haziness which appears slightly progressed from previous  She only had 1 chest x-ray during hospitalization for Covid  When patient followed up on the 23rd, she was prescribed a course of Levaquin She stated that the time she was feeling better, not having any significant symptoms Shortness of breath was getting better She had weaned this off of oxygen No fevers or chills No cough   Denies any pain or discomfort  She does have a history of sarcoidosis diagnosed many years ago  Past Medical History:  Diagnosis Date  . Anxiety   . Asthma   . Barrett's esophagus   . Cancer ([place])   . DJD (degenerative joint disease)   . GERD (gastroesophageal reflux disease)   . Goiter   . Hemorrhoids   . History of colonoscopy   . History of shingles    face/eye  . History of subdural hematoma   . History of traumatic subdural hematoma   . Hx of colonic polyps   . Hyperlipidemia   . Hypertension   . IBS (irritable bowel syndrome)   . Osteoarthritis   . S/P TAVR (transcatheter aortic valve replacement)    23 mm Edwards Sapien 3 transcatheter heart valve placed via percutaneous right transfemoral approach   . Sarcoidosis   . [facility])    Social History   Socioeconomic History  . Marital status: Married    Spouse name: Not on file  . Number of children: 4  . Years of education: Not on file  . Highest education level: Not on file  Occupational History  . Occupation: Retired    Fish farm manager: RETIRED    Comment: worked for [employer]  . Smoking status: Never Smoker  . Smokeless tobacco: Never Used  Substance and Sexual Activity  .  Alcohol use: Yes    Comment: Occ glass of wine with dinner  . Drug use: No  . Sexual activity: Not on file  Other Topics Concern  . Not on file  Social History Narrative  . Not on file   Social Determinants of Health   Financial Resource Strain:   . Difficulty of Paying Living Expenses: Not on file  Food Insecurity:   . Worried About Charity fundraiser in the Last Year: Not on file  . Ran Out of Food in the Last Year: Not on file  Transportation Needs:   . Lack of Transportation (Medical): Not on file  . Lack of Transportation (Non-Medical): Not on file  Physical Activity:   . Days of Exercise per Week: Not on file  . Minutes of Exercise per Session: Not on file  Stress:   . Feeling of Stress : Not on file  Social Connections:   . Frequency of Communication with Friends and Family: Not on file  . Frequency of Social Gatherings with Friends and Family: Not on file  . Attends Religious Services: Not on file  . Active Member of Clubs or Organizations: Not on file  . Attends Archivist Meetings: Not on file  . Marital Status: Not on file  Intimate Partner Violence:   . Fear of  Current or Ex-Partner: Not on file  . Emotionally Abused: Not on file  . Physically Abused: Not on file  . Sexually Abused: Not on file   Review of Systems  Constitutional: Positive for fatigue.  HENT: Negative.   Respiratory: Negative for cough, chest tightness and shortness of breath.   Cardiovascular: Negative for chest pain and leg swelling.  Musculoskeletal: Positive for arthralgias.  Neurological: Negative for weakness.  Psychiatric/Behavioral: Positive for dysphoric mood.  All other systems reviewed and are negative.     Objective:   Physical Exam Vitals reviewed.  Constitutional:      Appearance: Normal appearance.  HENT:     Head: Normocephalic.     Nose: Nose normal.     Mouth/Throat:     Mouth: Mucous membranes are moist.  Cardiovascular:     Rate and Rhythm: Normal  rate and regular rhythm.     Pulses: Normal pulses.     Heart sounds: Normal heart sounds. No murmur. No friction rub.  Pulmonary:     Effort: Pulmonary effort is normal. No respiratory distress.     Breath sounds: Normal breath sounds. No stridor. No wheezing or rhonchi.  Musculoskeletal:        General: Normal range of motion.     Cervical back: Normal range of motion and neck supple. No rigidity or tenderness.  Skin:    General: Skin is warm.     Coloration: Skin is not jaundiced or pale.  Neurological:     General: No focal deficit present.     Mental Status: She is alert.  Psychiatric:        Mood and Affect: Mood normal.    Vitals:   07/25/19 1606  BP: 132/72  Pulse: 81  Temp: (!) 97.3 F (36.3 C)  SpO2: 96%   Chest x-ray report from primary doctor's office reviewed Chest x-ray performed on 11/12 reviewed by myself CT angio 10/03/2018 reviewed showing mediastinal adenopathy-no significant evidence of lung scarring on the limited view    Assessment & Plan:  .  Abnormal chest x-ray with right lower lobe infiltrate .  Recent Covid pneumonia .  History of aortic valvular disease s/p TAVR .  History of hypothyroid .  Diastolic congestive heart failure .  Severe aortic stenosis .  Anxiety .  History of sarcoidosis-diagnosis as far back as 2012  Plan: Discontinue antibiotics .  Repeat chest x-ray in about 2 weeks .  Continue to stay active .  Will administer influenza vaccine today .  Encouraged to call if any new symptoms   .  Following reviewing follow-up x-rays-further testing may be indicated  .  I think we are dealing with a pneumonic process that has not completely resolved rather than a new infectious process as patient continues to feel better .  Will not initiate any antibiotics unless patient has symptoms  .  Will follow closely

## 2019-08-09 ENCOUNTER — Telehealth: Payer: Self-pay | Admitting: Family Medicine

## 2019-08-09 ENCOUNTER — Other Ambulatory Visit: Payer: Self-pay

## 2019-08-09 ENCOUNTER — Ambulatory Visit (INDEPENDENT_AMBULATORY_CARE_PROVIDER_SITE_OTHER): Payer: Medicare Other

## 2019-08-09 DIAGNOSIS — R0600 Dyspnea, unspecified: Secondary | ICD-10-CM | POA: Diagnosis not present

## 2019-08-09 MED ORDER — GABAPENTIN 300 MG PO CAPS: 300.0000 mg | ORAL_CAPSULE | Freq: Three times a day (TID) | ORAL | 0 refills | Status: DC

## 2019-08-09 NOTE — Telephone Encounter (Signed)
Rx called in and patient notified.

## 2019-08-09 NOTE — Telephone Encounter (Signed)
Patient is requesting a refill on: gabapentin (NEURONTIN) 300 MG capsule Pharmacy: [facility] at [employer] daughter, [name] at [phone]  ** Does she need to come in for an appointment? [name] said that she has been doing good but is just needing a refill.  She is weary about coming into doctors offices but will schedule if needed.

## 2019-08-10 ENCOUNTER — Encounter: Payer: Self-pay | Admitting: Family Medicine

## 2019-08-14 MED ORDER — GABAPENTIN 100 MG PO CAPS: 200.0000 mg | ORAL_CAPSULE | Freq: Two times a day (BID) | ORAL | 3 refills | Status: DC

## 2019-08-22 ENCOUNTER — Telehealth: Payer: Self-pay

## 2019-08-22 NOTE — Telephone Encounter (Signed)
Patients daughter Lynelle Smoke called to see if the no-show fee for 06/06/19 can be removed he mother had Covid and was in the hospital-she stated she has contacted billing and be unsuccessful-Tammy would like a call back once this is done because they are receiving notices all the time

## 2019-08-23 ENCOUNTER — Ambulatory Visit: Payer: Medicare Other | Admitting: Pulmonary Disease

## 2019-08-23 NOTE — Telephone Encounter (Signed)
error 

## 2019-09-09 NOTE — Telephone Encounter (Signed)
Email has been sent to Charge correction for the removal of this fee

## 2019-09-18 DIAGNOSIS — N183 Chronic kidney disease, stage 3 unspecified: Secondary | ICD-10-CM | POA: Diagnosis not present

## 2019-09-18 DIAGNOSIS — Z952 Presence of prosthetic heart valve: Secondary | ICD-10-CM | POA: Diagnosis not present

## 2019-09-18 DIAGNOSIS — U071 COVID-19: Secondary | ICD-10-CM | POA: Diagnosis not present

## 2019-09-18 DIAGNOSIS — E785 Hyperlipidemia, unspecified: Secondary | ICD-10-CM | POA: Diagnosis not present

## 2019-09-18 DIAGNOSIS — N2581 Secondary hyperparathyroidism of renal origin: Secondary | ICD-10-CM | POA: Diagnosis not present

## 2019-09-18 DIAGNOSIS — I129 Hypertensive chronic kidney disease with stage 1 through stage 4 chronic kidney disease, or unspecified chronic kidney disease: Secondary | ICD-10-CM | POA: Diagnosis not present

## 2019-09-18 DIAGNOSIS — I350 Nonrheumatic aortic (valve) stenosis: Secondary | ICD-10-CM | POA: Diagnosis not present

## 2019-09-18 DIAGNOSIS — K219 Gastro-esophageal reflux disease without esophagitis: Secondary | ICD-10-CM | POA: Diagnosis not present

## 2019-09-18 DIAGNOSIS — D860 Sarcoidosis of lung: Secondary | ICD-10-CM | POA: Diagnosis not present

## 2019-09-18 DIAGNOSIS — D638 Anemia in other chronic diseases classified elsewhere: Secondary | ICD-10-CM | POA: Diagnosis not present

## 2019-09-21 ENCOUNTER — Other Ambulatory Visit: Payer: Self-pay

## 2019-09-21 DIAGNOSIS — N189 Chronic kidney disease, unspecified: Secondary | ICD-10-CM | POA: Diagnosis not present

## 2019-09-21 DIAGNOSIS — E890 Postprocedural hypothyroidism: Secondary | ICD-10-CM | POA: Diagnosis not present

## 2019-09-21 DIAGNOSIS — E782 Mixed hyperlipidemia: Secondary | ICD-10-CM | POA: Diagnosis not present

## 2019-09-21 DIAGNOSIS — E7800 Pure hypercholesterolemia, unspecified: Secondary | ICD-10-CM | POA: Diagnosis not present

## 2019-09-21 DIAGNOSIS — I509 Heart failure, unspecified: Secondary | ICD-10-CM | POA: Diagnosis not present

## 2019-09-21 DIAGNOSIS — M47812 Spondylosis without myelopathy or radiculopathy, cervical region: Secondary | ICD-10-CM | POA: Diagnosis not present

## 2019-09-21 DIAGNOSIS — E039 Hypothyroidism, unspecified: Secondary | ICD-10-CM | POA: Diagnosis not present

## 2019-09-21 DIAGNOSIS — I2510 Atherosclerotic heart disease of native coronary artery without angina pectoris: Secondary | ICD-10-CM | POA: Diagnosis not present

## 2019-09-21 DIAGNOSIS — I10 Essential (primary) hypertension: Secondary | ICD-10-CM | POA: Diagnosis not present

## 2019-09-25 ENCOUNTER — Encounter: Payer: Self-pay | Admitting: Internal Medicine

## 2019-09-25 ENCOUNTER — Ambulatory Visit (INDEPENDENT_AMBULATORY_CARE_PROVIDER_SITE_OTHER): Payer: Medicare Other | Admitting: Internal Medicine

## 2019-09-25 ENCOUNTER — Other Ambulatory Visit: Payer: Self-pay

## 2019-09-25 VITALS — BP 130/80 | HR 62 | Ht 62.21 in | Wt 142.0 lb

## 2019-09-25 DIAGNOSIS — E890 Postprocedural hypothyroidism: Secondary | ICD-10-CM | POA: Diagnosis not present
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
## 2019-09-25 NOTE — Patient Instructions (Signed)
Please continue: - Synthroid 50 mcg daily.  Take the thyroid hormone every day, with water, at least 30 minutes before breakfast, separated by at least 4 hours from: - acid reflux medications - calcium - iron - multivitamins  Move the Multivitamin >4h from Synthroid.  Please stop at the lab.  Please come back for a follow-up appointment in 1 year.

## 2019-09-26 LAB — TSH: TSH: 5.04 u[IU]/mL — ABNORMAL HIGH (ref 0.35–4.50)

## 2019-09-26 LAB — T4, FREE: Free T4: 0.72 ng/dL (ref 0.60–1.60)

## 2019-09-26 MED ORDER — LEVOTHYROXINE SODIUM 50 MCG PO TABS: 50.0000 ug | ORAL_TABLET | Freq: Every day | ORAL | 3 refills | Status: DC

## 2019-10-02 ENCOUNTER — Other Ambulatory Visit (HOSPITAL_COMMUNITY): Payer: Self-pay

## 2019-10-03 ENCOUNTER — Encounter (HOSPITAL_COMMUNITY)
Admission: RE | Admit: 2019-10-03 | Discharge: 2019-10-03 | Disposition: A | Discharge status: Home / Self Care | Payer: Medicare Other | Source: Ambulatory Visit | Attending: Nephrology | Admitting: Nephrology

## 2019-10-03 DIAGNOSIS — D631 Anemia in chronic kidney disease: Secondary | ICD-10-CM | POA: Diagnosis not present

## 2019-10-03 DIAGNOSIS — N189 Chronic kidney disease, unspecified: Secondary | ICD-10-CM | POA: Insufficient documentation

## 2019-10-03 MED ORDER — SODIUM CHLORIDE 0.9 % IV SOLN
510.0000 mg | INTRAVENOUS | Status: DC
  Administered 2019-10-03: 510 mg via INTRAVENOUS
  Filled 2019-10-03: qty 17

## 2019-10-03 NOTE — Discharge Instructions (Signed)

## 2019-10-04 ENCOUNTER — Telehealth: Payer: Self-pay | Admitting: Internal Medicine

## 2019-10-04 MED ORDER — LEVOTHYROXINE SODIUM 75 MCG PO TABS: 75.0000 ug | ORAL_TABLET | Freq: Every day | ORAL | 3 refills | Status: DC

## 2019-10-04 NOTE — Telephone Encounter (Signed)
Patient's daughter called stating patient had high TSH after seeing lab results and the patient needed to increase her synthroid to 75 from 56 and they are requesting the 43 to be called in since patient is almost out of old RX for 82.  Pharmacy -   [facility] [place], [place] - [address] Phone:  [phone]  Fax:  [phone]

## 2019-10-04 NOTE — Telephone Encounter (Signed)
New RX sent

## 2019-10-10 ENCOUNTER — Encounter (HOSPITAL_COMMUNITY)
Admission: RE | Admit: 2019-10-10 | Discharge: 2019-10-10 | Disposition: A | Discharge status: Home / Self Care | Payer: Medicare Other | Source: Ambulatory Visit | Attending: Nephrology | Admitting: Nephrology

## 2019-10-10 ENCOUNTER — Other Ambulatory Visit: Payer: Self-pay

## 2019-10-10 DIAGNOSIS — N189 Chronic kidney disease, unspecified: Secondary | ICD-10-CM | POA: Diagnosis not present

## 2019-10-10 DIAGNOSIS — D631 Anemia in chronic kidney disease: Secondary | ICD-10-CM | POA: Diagnosis not present

## 2019-10-10 MED ORDER — SODIUM CHLORIDE 0.9 % IV SOLN
510.0000 mg | INTRAVENOUS | Status: DC
  Administered 2019-10-10: 510 mg via INTRAVENOUS
  Filled 2019-10-10: qty 510

## 2019-11-08 DIAGNOSIS — E039 Hypothyroidism, unspecified: Secondary | ICD-10-CM | POA: Diagnosis not present

## 2019-11-08 DIAGNOSIS — I2510 Atherosclerotic heart disease of native coronary artery without angina pectoris: Secondary | ICD-10-CM | POA: Diagnosis not present

## 2019-11-08 DIAGNOSIS — E782 Mixed hyperlipidemia: Secondary | ICD-10-CM | POA: Diagnosis not present

## 2019-11-08 DIAGNOSIS — E7800 Pure hypercholesterolemia, unspecified: Secondary | ICD-10-CM | POA: Diagnosis not present

## 2019-11-08 DIAGNOSIS — M47812 Spondylosis without myelopathy or radiculopathy, cervical region: Secondary | ICD-10-CM | POA: Diagnosis not present

## 2019-11-08 DIAGNOSIS — I509 Heart failure, unspecified: Secondary | ICD-10-CM | POA: Diagnosis not present

## 2019-11-08 DIAGNOSIS — E890 Postprocedural hypothyroidism: Secondary | ICD-10-CM | POA: Diagnosis not present

## 2019-11-08 DIAGNOSIS — N189 Chronic kidney disease, unspecified: Secondary | ICD-10-CM | POA: Diagnosis not present

## 2019-11-08 DIAGNOSIS — I10 Essential (primary) hypertension: Secondary | ICD-10-CM | POA: Diagnosis not present
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
## 2019-11-21 ENCOUNTER — Encounter: Payer: Self-pay | Admitting: Physician Assistant

## 2019-11-21 ENCOUNTER — Other Ambulatory Visit: Payer: Self-pay

## 2019-11-21 ENCOUNTER — Ambulatory Visit (INDEPENDENT_AMBULATORY_CARE_PROVIDER_SITE_OTHER): Payer: Medicare Other | Admitting: Physician Assistant

## 2019-11-21 ENCOUNTER — Ambulatory Visit (HOSPITAL_COMMUNITY): Payer: Medicare Other | Attending: Cardiovascular Disease

## 2019-11-21 VITALS — BP 122/66 | HR 64 | Ht 63.0 in | Wt 136.0 lb

## 2019-11-21 DIAGNOSIS — E039 Hypothyroidism, unspecified: Secondary | ICD-10-CM | POA: Diagnosis not present

## 2019-11-21 DIAGNOSIS — Z952 Presence of prosthetic heart valve: Secondary | ICD-10-CM | POA: Diagnosis not present

## 2019-11-21 DIAGNOSIS — I10 Essential (primary) hypertension: Secondary | ICD-10-CM | POA: Diagnosis not present

## 2019-11-21 DIAGNOSIS — E782 Mixed hyperlipidemia: Secondary | ICD-10-CM

## 2019-11-21 MED ORDER — AMOXICILLIN 500 MG PO TABS
ORAL_TABLET | ORAL | 12 refills | Status: DC
Start: 2019-11-21 — End: 2021-03-10

## 2019-11-21 NOTE — Patient Instructions (Signed)
Medication Instructions:  Your provider recommends that you continue on your current medications as directed. Please refer to the Current Medication list given to you today.   *If you need a refill on your cardiac medications before your next appointment, please call your pharmacy*  Lab Work: TODAY! BMET, CBC, Lipids, TSH, T4 If you have labs (blood work) drawn today and your tests are completely normal, you will receive your results only by: Marland Kitchen MyChart Message (if you have MyChart) OR . A paper copy in the mail If you have any lab test that is abnormal or we need to change your treatment, we will call you to review the results.  Follow-Up: At [facility], you and your health needs are our priority.  As part of our continuing mission to provide you with exceptional heart care, we have created designated Provider Care Teams.  These Care Teams include your primary Cardiologist (physician) and Advanced Practice Providers (APPs -  Physician Assistants and Nurse Practitioners) who all work together to provide you with the care you need, when you need it. Your next appointment:   12 month(s) The format for your next appointment:   In Person Provider:   You may see [name], MD or one of the following Advanced Practice Providers on your designated Care Team:    Richardson Dopp, PA-C  [place], [place]  Daune Perch, [place]

## 2019-11-22 ENCOUNTER — Telehealth: Payer: Self-pay

## 2019-11-22 LAB — LIPID PANEL
Chol/HDL Ratio: 4.5 ratio — ABNORMAL HIGH (ref 0.0–4.4)
Cholesterol, Total: 241 mg/dL — ABNORMAL HIGH (ref 100–199)
HDL: 53 mg/dL (ref >39)
LDL Chol Calc (NIH): 157 mg/dL — ABNORMAL HIGH (ref 0–99)
Triglycerides: 173 mg/dL — ABNORMAL HIGH (ref 0–149)
VLDL Cholesterol Cal: 31 mg/dL (ref 5–40)

## 2019-11-22 LAB — CBC WITH DIFFERENTIAL/PLATELET
Basophils Absolute: 0 10*3/uL (ref 0.0–0.2)
Basos: 0 %
EOS (ABSOLUTE): 0.3 10*3/uL (ref 0.0–0.4)
Eos: 4 %
Hematocrit: 42.1 % (ref 34.0–46.6)
Hemoglobin: 14.5 g/dL (ref 11.1–15.9)
Immature Grans (Abs): 0.1 10*3/uL (ref 0.0–0.1)
Immature Granulocytes: 1 %
Lymphocytes Absolute: 1.5 10*3/uL (ref 0.7–3.1)
Lymphs: 23 %
MCH: 30.6 pg (ref 26.6–33.0)
MCHC: 34.4 g/dL (ref 31.5–35.7)
MCV: 89 fL (ref 79–97)
Monocytes Absolute: 0.7 10*3/uL (ref 0.1–0.9)
Monocytes: 11 %
Neutrophils Absolute: 4.1 10*3/uL (ref 1.4–7.0)
Neutrophils: 61 %
Platelets: 175 10*3/uL (ref 150–450)
RBC: 4.74 x10E6/uL (ref 3.77–5.28)
RDW: 15 % (ref 11.7–15.4)
WBC: 6.7 10*3/uL (ref 3.4–10.8)

## 2019-11-22 LAB — BASIC METABOLIC PANEL
BUN/Creatinine Ratio: 28 (ref 12–28)
BUN: 23 mg/dL (ref 8–27)
CO2: 24 mmol/L (ref 20–29)
Calcium: 9.2 mg/dL (ref 8.7–10.3)
Chloride: 103 mmol/L (ref 96–106)
Creatinine, Ser: 0.83 mg/dL (ref 0.57–1.00)
GFR calc Af Amer: 74 mL/min/{1.73_m2} (ref >59)
GFR calc non Af Amer: 64 mL/min/{1.73_m2} (ref >59)
Glucose: 90 mg/dL (ref 65–99)
Potassium: 3.8 mmol/L (ref 3.5–5.2)
Sodium: 140 mmol/L (ref 134–144)

## 2019-11-22 LAB — T4, FREE: Free T4: 1.49 ng/dL (ref 0.82–1.77)

## 2019-11-22 LAB — TSH: TSH: 0.619 u[IU]/mL (ref 0.450–4.500)

## 2019-11-22 MED ORDER — ROSUVASTATIN CALCIUM 20 MG PO TABS
20.0000 mg | ORAL_TABLET | Freq: Every day | ORAL | 3 refills | Status: DC
Start: 2019-11-22 — End: 2019-12-07

## 2019-11-22 NOTE — Telephone Encounter (Signed)
Spoke with the pt and her daughter , Lynelle Smoke, and they are reporting when the pt was on the Crestor 40 mg....she was having severe leg pain at night when trying to sleep. They did not realize it may have been the Crestor until it was stopped and the leg pain has been gone ever since.   She is asking if they should restart the 40 mg a day and see if the leg cramps return... or try a lower dose.. or another med.   Will forward to Kathlene November PA for review and recommendations.

## 2019-11-22 NOTE — Telephone Encounter (Signed)
Pt's daughter advised.  

## 2019-11-22 NOTE — Telephone Encounter (Signed)
Lets try her back on half the dose and tell her to let us know if she develops problems on that.

## 2019-11-22 NOTE — Telephone Encounter (Signed)
-----   Message from Eileen Stanford, [place] sent at 11/22/2019 12:07 PM EDT ----- Can you let her know that all her labs look good. Her LDL is much higher than before since stopping her Crestor. I would recommended she go back on it at the same dosing she was on.

## 2019-11-23 ENCOUNTER — Other Ambulatory Visit: Payer: Medicare Other

## 2019-12-07 ENCOUNTER — Other Ambulatory Visit: Payer: Self-pay | Admitting: Physician Assistant

## 2019-12-07 DIAGNOSIS — Z20828 Contact with and (suspected) exposure to other viral communicable diseases: Secondary | ICD-10-CM | POA: Diagnosis not present

## 2019-12-07 MED ORDER — PRAVASTATIN SODIUM 10 MG PO TABS: 10.0000 mg | ORAL_TABLET | Freq: Every evening | ORAL | 6 refills | Status: DC

## 2019-12-07 MED ORDER — PRAVASTATIN SODIUM 10 MG PO TABS: 20.0000 mg | ORAL_TABLET | Freq: Every evening | ORAL | 6 refills | Status: DC

## 2019-12-11 ENCOUNTER — Encounter: Payer: Self-pay | Admitting: Family Medicine

## 2019-12-11 ENCOUNTER — Other Ambulatory Visit: Payer: Self-pay

## 2019-12-11 ENCOUNTER — Telehealth: Payer: Self-pay | Admitting: Physician Assistant

## 2019-12-11 DIAGNOSIS — E782 Mixed hyperlipidemia: Secondary | ICD-10-CM | POA: Diagnosis not present

## 2019-12-11 DIAGNOSIS — E039 Hypothyroidism, unspecified: Secondary | ICD-10-CM | POA: Diagnosis not present

## 2019-12-11 DIAGNOSIS — I2510 Atherosclerotic heart disease of native coronary artery without angina pectoris: Secondary | ICD-10-CM | POA: Diagnosis not present

## 2019-12-11 DIAGNOSIS — I10 Essential (primary) hypertension: Secondary | ICD-10-CM | POA: Diagnosis not present

## 2019-12-11 DIAGNOSIS — M47812 Spondylosis without myelopathy or radiculopathy, cervical region: Secondary | ICD-10-CM | POA: Diagnosis not present

## 2019-12-11 DIAGNOSIS — I509 Heart failure, unspecified: Secondary | ICD-10-CM | POA: Diagnosis not present

## 2019-12-11 DIAGNOSIS — E7800 Pure hypercholesterolemia, unspecified: Secondary | ICD-10-CM | POA: Diagnosis not present

## 2019-12-11 DIAGNOSIS — N189 Chronic kidney disease, unspecified: Secondary | ICD-10-CM | POA: Diagnosis not present

## 2019-12-11 DIAGNOSIS — E890 Postprocedural hypothyroidism: Secondary | ICD-10-CM | POA: Diagnosis not present

## 2019-12-11 NOTE — Telephone Encounter (Signed)
   Caryl Pina from [employer], she is requesting to get a copy of pt's recent lab work to be send to Dr. [name] fax# [phone]

## 2019-12-25 DIAGNOSIS — H35363 Drusen (degenerative) of macula, bilateral: Secondary | ICD-10-CM | POA: Diagnosis not present

## 2019-12-25 DIAGNOSIS — H35373 Puckering of macula, bilateral: Secondary | ICD-10-CM | POA: Diagnosis not present

## 2019-12-25 DIAGNOSIS — H3540 Unspecified peripheral retinal degeneration: Secondary | ICD-10-CM | POA: Diagnosis not present

## 2019-12-25 DIAGNOSIS — H43813 Vitreous degeneration, bilateral: Secondary | ICD-10-CM | POA: Diagnosis not present

## 2020-01-02 DIAGNOSIS — I509 Heart failure, unspecified: Secondary | ICD-10-CM | POA: Diagnosis not present

## 2020-01-02 DIAGNOSIS — M1990 Unspecified osteoarthritis, unspecified site: Secondary | ICD-10-CM | POA: Diagnosis not present

## 2020-01-02 DIAGNOSIS — E890 Postprocedural hypothyroidism: Secondary | ICD-10-CM | POA: Diagnosis not present

## 2020-01-02 DIAGNOSIS — E039 Hypothyroidism, unspecified: Secondary | ICD-10-CM | POA: Diagnosis not present

## 2020-01-02 DIAGNOSIS — E782 Mixed hyperlipidemia: Secondary | ICD-10-CM | POA: Diagnosis not present

## 2020-01-02 DIAGNOSIS — E7800 Pure hypercholesterolemia, unspecified: Secondary | ICD-10-CM | POA: Diagnosis not present

## 2020-01-02 DIAGNOSIS — I10 Essential (primary) hypertension: Secondary | ICD-10-CM | POA: Diagnosis not present

## 2020-01-02 DIAGNOSIS — N189 Chronic kidney disease, unspecified: Secondary | ICD-10-CM | POA: Diagnosis not present

## 2020-01-02 DIAGNOSIS — I2510 Atherosclerotic heart disease of native coronary artery without angina pectoris: Secondary | ICD-10-CM | POA: Diagnosis not present

## 2020-01-02 DIAGNOSIS — M47812 Spondylosis without myelopathy or radiculopathy, cervical region: Secondary | ICD-10-CM | POA: Diagnosis not present

## 2020-01-15 ENCOUNTER — Encounter: Payer: Self-pay | Admitting: Physical Therapy

## 2020-01-15 ENCOUNTER — Other Ambulatory Visit: Payer: Self-pay

## 2020-01-15 ENCOUNTER — Ambulatory Visit: Payer: Medicare Other | Attending: Family Medicine | Admitting: Physical Therapy

## 2020-01-15 DIAGNOSIS — R293 Abnormal posture: Secondary | ICD-10-CM

## 2020-01-15 DIAGNOSIS — M542 Cervicalgia: Secondary | ICD-10-CM | POA: Insufficient documentation

## 2020-01-15 NOTE — Therapy (Signed)
[place], [place], [address] Phone: [phone]   Fax:  [phone]  Physical Therapy Evaluation  Patient Details  Name: Brenda [name] MRN: [id number] Date of Birth: 1930/08/30 Referring Provider (PT): Hulan Saas, DO   Encounter Date: 01/15/2020   PT End of Session - 01/15/20 1607    Visit Number 1    Number of Visits 12    Date for PT Re-Evaluation 02/26/20    Authorization Type Medicare-progress note by visit 61, recheck FOTO at visit 6    PT Start Time 1146    PT Stop Time 1233    PT Time Calculation (min) 47 min    Activity Tolerance Patient tolerated treatment well    Behavior During Therapy [facility] for tasks assessed/performed           Past Medical History:  Diagnosis Date  . Anxiety   . Asthma   . Barrett's esophagus   . Cancer ([place])   . DJD (degenerative joint disease)   . GERD (gastroesophageal reflux disease)   . Goiter   . Hemorrhoids   . History of colonoscopy   . History of shingles    face/eye  . History of subdural hematoma   . History of traumatic subdural hematoma   . Hx of colonic polyps   . Hyperlipidemia   . Hypertension   . IBS (irritable bowel syndrome)   . Osteoarthritis   . S/P TAVR (transcatheter aortic valve replacement)    23 mm Edwards Sapien 3 transcatheter heart valve placed via percutaneous right transfemoral approach   . Sarcoidosis   . [facility])     Past Surgical History:  Procedure Laterality Date  . BREAST LUMPECTOMY  1974   left  . CATARACT EXTRACTION W/ INTRAOCULAR LENS  IMPLANT, BILATERAL    . CRANIOTOMY Right 01/04/2014   Procedure: CRANIOTOMY HEMATOMA EVACUATION SUBDURAL;  Surgeon: [name], MD;  Location: [place] NEURO ORS;  Service: Neurosurgery;  Laterality: Right;  . CRANIOTOMY N/A 01/08/2014   Procedure: Redo CRANIOTOMY HEMATOMA EVACUATION SUBDURAL RIGHT;  Surgeon: [name], MD;  Location: [place] NEURO ORS;  Service: Neurosurgery;   Laterality: N/A;  Redo CRANIOTOMY HEMATOMA EVACUATION SUBDURAL RIGHT  . CRANIOTOMY Right 01/10/2014   Procedure: Redo CRANIOTOMY HEMATOMA EVACUATION SUBDURAL;  Surgeon: [name], MD;  Location: [place] NEURO ORS;  Service: Neurosurgery;  Laterality: Right;  . DILATION AND CURETTAGE OF UTERUS    . EYE SURGERY    . NASAL SINUS SURGERY    . NECK SURGERY     x 2  . RIGHT/LEFT HEART CATH AND CORONARY ANGIOGRAPHY N/A 09/27/2018   Procedure: RIGHT/LEFT HEART CATH AND CORONARY ANGIOGRAPHY;  Surgeon: Burnell Blanks, MD;  Location: [place] CV LAB;  Service: Cardiovascular;  Laterality: N/A;  . TEE WITHOUT CARDIOVERSION N/A 11/28/2018   Procedure: TRANSESOPHAGEAL ECHOCARDIOGRAM (TEE);  Surgeon: Sherren Mocha, MD;  Location: [place];  Service: Open Heart Surgery;  Laterality: N/A;  . TONSILLECTOMY AND ADENOIDECTOMY  1976  . TOTAL THYROIDECTOMY  06-05-08  . TRANSCATHETER AORTIC VALVE REPLACEMENT, TRANSFEMORAL N/A 11/28/2018   Procedure: TRANSCATHETER AORTIC VALVE REPLACEMENT, TRANSFEMORAL;  Surgeon: Sherren Mocha, MD;  Location: [place];  Service: Open Heart Surgery;  Laterality: N/A;    There were no vitals filed for this visit.    Subjective Assessment - 01/15/20 1549    Subjective Pt. is a 86 y/o female referred to PT for c/o neck pain. She had a remote history of  cervical fusion surgery and was seen for PT several visits last August with improvement at the time. She reports symptom exacerbation about a month ago associated with helping as caregiver for her husband who passed away a week ago from complications related to [employer] disease. Primary pain is in left upper trapezius region but she reports intermittent radiating parasthesias from right shoulder down into arm and hand.    Patient is accompained by: Family member   daughter   Pertinent History cervical fusion, sarcoidosis, Covid infection with hospitalization x 5 days in November 2020, arthritis, craniotomy, TAVR    Limitations  Sitting;House hold activities;Lifting    Diagnostic tests past CT    Patient Stated Goals Get rid of neck pain    Currently in Pain? Yes    Pain Score 4     Pain Location Neck    Pain Orientation Left    Pain Descriptors / Indicators Dull;Aching    Pain Type Chronic pain;Acute pain   acute on chronic   Pain Radiating Towards left upper trapezius and periscapuar region    Pain Onset More than a month ago    Pain Frequency Constant    Aggravating Factors  turning head, lifting, activity    Pain Relieving Factors past relief with PT              [facility] PT Assessment - 01/15/20 0001      Assessment   Medical Diagnosis Neck arthritis    Referring Provider (PT) Hulan Saas, DO    Onset Date/Surgical Date 12/15/19   per subjective report of symptom exacerbation x 1 month ago   Hand Dominance Right    Prior Therapy past PT for neck 8/20      Precautions   Precautions None      Restrictions   Weight Bearing Restrictions No      Balance Screen   Has the patient fallen in the past 6 months No      [place] residence    Living Arrangements Alone    Type of [place] Access Stairs to enter    Entrance Stairs-Number of Steps 3    Entrance Stairs-Rails Left    Home Layout Two level    Alternate Level Stairs-Number of Steps --   1 flight   Alternate Level Stairs-Rails Right;Left      Prior Function   Level of Independence Independent with basic ADLs      Cognition   Overall Cognitive Status Within Functional Limits for tasks assessed   daughter assisted with subjective portion of eval     Observation/Other Assessments   Focus on Therapeutic Outcomes (FOTO)  46% limited      Sensation   Light Touch --   decreased sensation at C6 dermatome on right side     Posture/Postural Control   Posture/Postural Control Postural limitations    Postural Limitations Rounded Shoulders      ROM / Strength   AROM / PROM / Strength  AROM;Strength      AROM   Overall AROM Comments Bilateral shoulder AROM grossly [facility]    AROM Assessment Site Cervical    Cervical Flexion 57    Cervical Extension 40    Cervical - Right Side Bend 25    Cervical - Left Side Bend 25    Cervical - Right Rotation 41    Cervical - Left Rotation 50      Strength  Overall Strength Comments bilateral UE strength grossly 5/5      Flexibility   Soft Tissue Assessment /Muscle Length --   tight left>right upper trapezius and levator     Palpation   Palpation comment tightness with tenderness in left upper and middle trapezius, levator, supraspinatus region, bony prominence left upper cervical region-pt. reports past imaging findings of bone spur this region      Special Tests   Other special tests Spurling's (-)                      Objective measurements completed on examination: See above findings.         Trigger Point Dry Needling - 01/15/20 0001    Consent Given? Yes    Education Handout Provided --   verbal education-has had procedure in the past with handout   Muscles Treated Head and Neck Upper trapezius;Levator scapulae    Muscles Treated Upper Quadrant Supraspinatus    Dry Needling Comments needling to left side muscles as noted in right sidelying with 32 gauge 30 mm needles    Upper Trapezius Response Twitch reponse elicited    Levator Scapulae Response Twitch response elicited                PT Education - 01/15/20 1606    Education Details HEP, symptom etiology, POC, dry needling, FOTO patient report    Person(s) Educated Patient;Child(ren)    Methods Explanation;Demonstration;Verbal cues;Handout    Comprehension Verbalized understanding;Returned demonstration               PT Long Term Goals - 01/15/20 1612      PT LONG TERM GOAL #1   Title Independent with HEP    Baseline needs HEP    Time 6    Period Weeks    Status New    Target Date 02/26/20      PT LONG TERM GOAL #2   Title  Increase right cervical rotation AROM to 50 deg or greater to improve ability to turn head for ADLs, safety with community ambulation and crossing street    Baseline 41 deg    Time 6    Period Weeks    Status New    Target Date 02/26/20      PT LONG TERM GOAL #3   Title Improve FOTO outcome measure score to 40% or less impairment    Baseline 46% limited    Time 6    Period Weeks    Status New    Target Date 02/26/20      PT LONG TERM GOAL #4   Title Tolerate sitting for eating meals and car travel periods at least 20-30 min with pain 2/10 or less    Baseline 4/10 with higher pain level intermittently    Time 6    Period Weeks    Status New    Target Date 02/26/20      PT LONG TERM GOAL #5   Title Decrease sleep disturbance symptoms at least 50% or greater from current status    Time 6    Period Weeks    Status New    Target Date 02/26/20                  Plan - 01/15/20 1608    Clinical Impression Statement Pt. presents with recent exacerbation of neck and upper trapezius region pain. Primary symptoms in left upper trapezius region consistent with myofascial etiology but RUE symptoms  could be potentially radicular/associated with adjacent segment DDD with history fusion surgery. Pt. would benefit from PT to help relieve pain and address associated functional limitations.    Personal Factors and Comorbidities Age;Comorbidity 3+;Past/Current Experience   past PT   Comorbidities see PMH    Examination-Activity Limitations Sit;Sleep;Lift;Carry    Stability/Clinical Decision Making Evolving/Moderate complexity    Clinical Decision Making Moderate    Rehab Potential Good    PT Frequency 2x / week    PT Duration 6 weeks    PT Treatment/Interventions ADLs/Self Care Home Management;Cryotherapy;Ultrasound;Electrical Stimulation;Moist Heat;Therapeutic activities;Neuromuscular re-education;Therapeutic exercise;Patient/family education;Manual techniques;Dry needling;Taping    PT  Next Visit Plan STM to left upper trapezius and periscapular region, continue dry needling, cervical retractions and flexion bias ROM, stretches, postural strengthening, modalities prn for pain    PT Home Exercise Plan [id number]    Consulted and Agree with Plan of Care Patient;Family member/caregiver           Patient will benefit from skilled therapeutic intervention in order to improve the following deficits and impairments:  Pain, Postural dysfunction, Decreased activity tolerance, Decreased range of motion, Increased muscle spasms, Impaired sensation  Visit Diagnosis: Cervicalgia  Abnormal posture     Problem List Patient Active Problem List   Diagnosis Date Noted  . Hypotension 06/08/2019  . Severe sepsis ([place]) 06/08/2019  . Acute respiratory failure with hypoxia ([place]) 06/07/2019  . Pneumonia due to COVID-19 virus 06/07/2019  . Acute on chronic kidney failure ([place])   . Chronic diastolic CHF (congestive heart failure) ([place])   . History of traumatic subdural hematoma   . S/P TAVR (transcatheter aortic valve replacement)   . Severe aortic stenosis   . Neck arthritis 09/08/2018  . Multiple fractures of ribs of left side 12/24/2013  . Urinary retention 12/24/2013  . Hypokalemia 07/12/2013  . CAD (coronary artery disease) 04/20/2013  . Hypothyroidism 07/09/2010  . CONSTIPATION 07/09/2010  . HEMATEMESIS 08/14/2008  . Sarcoidosis 08/09/2008  . Hyperlipidemia 08/09/2008  . Anxiety 08/09/2008  . Hypertension 08/09/2008  . HEMORRHOIDS 08/09/2008  . GERD 08/09/2008  . BARRETTS ESOPHAGUS 08/09/2008  . Irritable bowel syndrome 08/09/2008  . DEGENERATIVE JOINT DISEASE 08/09/2008  . COLONIC POLYPS, ADENOMATOUS, HX OF 08/09/2008  . HELICOBACTER PYLORI GASTRITIS, HX OF 08/09/2008    Beaulah Dinning, PT, DPT 01/15/20 4:16 PM  [place], [place], [address] Phone: [phone]   Fax:   [phone]  Name: Brenda [name] MRN: [id number] Date of Birth: July 05, 1931

## 2020-01-17 ENCOUNTER — Other Ambulatory Visit: Payer: Self-pay | Admitting: Family Medicine

## 2020-01-23 ENCOUNTER — Encounter: Payer: Self-pay | Admitting: Family Medicine

## 2020-01-23 ENCOUNTER — Ambulatory Visit (INDEPENDENT_AMBULATORY_CARE_PROVIDER_SITE_OTHER): Payer: Medicare Other | Admitting: Family Medicine

## 2020-01-23 DIAGNOSIS — M47812 Spondylosis without myelopathy or radiculopathy, cervical region: Secondary | ICD-10-CM | POA: Diagnosis not present

## 2020-01-23 NOTE — Progress Notes (Signed)
Virtual Visit via Video Note  I connected with [name] on 01/23/20 at  4:00 PM EDT by a video enabled telemedicine application and verified that I am speaking with the correct person using two identifiers. Patient is accompanied with daughter and home setting Location: Patient: Patient is at home setting Provider: In office setting   I discussed the limitations of evaluation and management by telemedicine and the availability of in person appointments. The patient expressed understanding and agreed to proceed.  History of Present Illness: Patient is an 86 year old female with a past medical history significant for degenerative disc disease of the cervical spine status post surgical repair.  Patient has been the primary caretaker for her ailing husband who recently did pass away.  Started having increasing neck pain and upper shoulder pain again.  Started physical therapy yesterday and did have good relief with some of the dry needling.  Patient has been taking 300 mg of gabapentin at night and 200 mg during the day.  Patient states that the nighttime doses helped her sleep.  Patient's daughter though states that she has been much more confused recently during the day.  Patient denies any weakness of the upper extremity.  Just more of a pain that does not seem to ever completely resolved.    Observations/Objective: Alert and oriented x3, patient is minorly standoffish with her daughter difficulty with the virtual platform so did most of this on phone call.  Assessment and Plan: 86 year old female who is having some response to physical therapy as well as gabapentin at this moment.  Secondary to patient's other comorbidities and social determinants of health with recent loss of husband we did this more virtually.  Discussed the gabapentin would be fine at this moment but only 300 at night and not the daytime dosing.  Patient will continue with physical therapy.  Patient will check in with me  again via MyChart in the next 2 to 3 weeks and then after that we will discuss if needed to see patient in office to consider the possibility of injections such as trigger point.   Follow Up Instructions: 2 to 3 weeks per my chart    I discussed the assessment and treatment plan with the patient. The patient was provided an opportunity to ask questions and all were answered. The patient agreed with the plan and demonstrated an understanding of the instructions.   The patient was advised to call back or seek an in-person evaluation if the symptoms worsen or if the condition fails to improve as anticipated.  I provided 21 minutes of face-to-face time during this encounter.  Difficulty though with the virtual platform.  This includes time reviewing the chart and imaging.   Lyndal Pulley, DO

## 2020-01-29 DIAGNOSIS — M1990 Unspecified osteoarthritis, unspecified site: Secondary | ICD-10-CM | POA: Diagnosis not present

## 2020-01-29 DIAGNOSIS — E782 Mixed hyperlipidemia: Secondary | ICD-10-CM | POA: Diagnosis not present

## 2020-01-29 DIAGNOSIS — I2510 Atherosclerotic heart disease of native coronary artery without angina pectoris: Secondary | ICD-10-CM | POA: Diagnosis not present

## 2020-01-29 DIAGNOSIS — M47812 Spondylosis without myelopathy or radiculopathy, cervical region: Secondary | ICD-10-CM | POA: Diagnosis not present

## 2020-01-29 DIAGNOSIS — E039 Hypothyroidism, unspecified: Secondary | ICD-10-CM | POA: Diagnosis not present

## 2020-01-29 DIAGNOSIS — E7800 Pure hypercholesterolemia, unspecified: Secondary | ICD-10-CM | POA: Diagnosis not present

## 2020-01-29 DIAGNOSIS — I509 Heart failure, unspecified: Secondary | ICD-10-CM | POA: Diagnosis not present

## 2020-01-29 DIAGNOSIS — I10 Essential (primary) hypertension: Secondary | ICD-10-CM | POA: Diagnosis not present

## 2020-01-29 DIAGNOSIS — E890 Postprocedural hypothyroidism: Secondary | ICD-10-CM | POA: Diagnosis not present

## 2020-01-29 DIAGNOSIS — N189 Chronic kidney disease, unspecified: Secondary | ICD-10-CM | POA: Diagnosis not present

## 2020-01-30 ENCOUNTER — Encounter: Payer: Self-pay | Admitting: Physical Therapy

## 2020-01-30 ENCOUNTER — Ambulatory Visit: Payer: Medicare Other | Attending: Family Medicine | Admitting: Physical Therapy

## 2020-01-30 ENCOUNTER — Other Ambulatory Visit: Payer: Self-pay

## 2020-01-30 DIAGNOSIS — R293 Abnormal posture: Secondary | ICD-10-CM

## 2020-01-30 DIAGNOSIS — M542 Cervicalgia: Secondary | ICD-10-CM | POA: Diagnosis not present

## 2020-01-30 DIAGNOSIS — R2689 Other abnormalities of gait and mobility: Secondary | ICD-10-CM | POA: Diagnosis not present

## 2020-01-30 NOTE — Therapy (Signed)
[place], [place], [address] Phone: [phone]   Fax:  [phone]  Physical Therapy Treatment  Patient Details  Name: Brenda [name] MRN: [id number] Date of Birth: 30-Dec-1930 Referring Provider (PT): Hulan Saas, DO   Encounter Date: 01/30/2020   PT End of Session - 01/30/20 1403    Visit Number 2    Number of Visits 12    Date for PT Re-Evaluation 02/26/20    Authorization Type Medicare-progress note by visit 21, recheck FOTO at visit 6    PT Start Time 1323    PT Stop Time 1423   time spent for dry needling, estim and MHP not included in direct timed minutes   PT Time Calculation (min) 60 min    Activity Tolerance Patient tolerated treatment well    Behavior During Therapy [facility] for tasks assessed/performed           Past Medical History:  Diagnosis Date  . Anxiety   . Asthma   . Barrett's esophagus   . Cancer ([place])   . DJD (degenerative joint disease)   . GERD (gastroesophageal reflux disease)   . Goiter   . Hemorrhoids   . History of colonoscopy   . History of shingles    face/eye  . History of subdural hematoma   . History of traumatic subdural hematoma   . Hx of colonic polyps   . Hyperlipidemia   . Hypertension   . IBS (irritable bowel syndrome)   . Osteoarthritis   . S/P TAVR (transcatheter aortic valve replacement)    23 mm Edwards Sapien 3 transcatheter heart valve placed via percutaneous right transfemoral approach   . Sarcoidosis   . [facility])     Past Surgical History:  Procedure Laterality Date  . BREAST LUMPECTOMY  1974   left  . CATARACT EXTRACTION W/ INTRAOCULAR LENS  IMPLANT, BILATERAL    . CRANIOTOMY Right 01/04/2014   Procedure: CRANIOTOMY HEMATOMA EVACUATION SUBDURAL;  Surgeon: [name], MD;  Location: [place] NEURO ORS;  Service: Neurosurgery;  Laterality: Right;  . CRANIOTOMY N/A 01/08/2014   Procedure: Redo CRANIOTOMY HEMATOMA EVACUATION SUBDURAL RIGHT;   Surgeon: [name], MD;  Location: [place] NEURO ORS;  Service: Neurosurgery;  Laterality: N/A;  Redo CRANIOTOMY HEMATOMA EVACUATION SUBDURAL RIGHT  . CRANIOTOMY Right 01/10/2014   Procedure: Redo CRANIOTOMY HEMATOMA EVACUATION SUBDURAL;  Surgeon: [name], MD;  Location: [place] NEURO ORS;  Service: Neurosurgery;  Laterality: Right;  . DILATION AND CURETTAGE OF UTERUS    . EYE SURGERY    . NASAL SINUS SURGERY    . NECK SURGERY     x 2  . RIGHT/LEFT HEART CATH AND CORONARY ANGIOGRAPHY N/A 09/27/2018   Procedure: RIGHT/LEFT HEART CATH AND CORONARY ANGIOGRAPHY;  Surgeon: Burnell Blanks, MD;  Location: [place] CV LAB;  Service: Cardiovascular;  Laterality: N/A;  . TEE WITHOUT CARDIOVERSION N/A 11/28/2018   Procedure: TRANSESOPHAGEAL ECHOCARDIOGRAM (TEE);  Surgeon: Sherren Mocha, MD;  Location: [place];  Service: Open Heart Surgery;  Laterality: N/A;  . TONSILLECTOMY AND ADENOIDECTOMY  1976  . TOTAL THYROIDECTOMY  06-05-08  . TRANSCATHETER AORTIC VALVE REPLACEMENT, TRANSFEMORAL N/A 11/28/2018   Procedure: TRANSCATHETER AORTIC VALVE REPLACEMENT, TRANSFEMORAL;  Surgeon: Sherren Mocha, MD;  Location: [place];  Service: Open Heart Surgery;  Laterality: N/A;    There were no vitals filed for this visit.   Subjective Assessment - 01/30/20 1323    Subjective Pt. reports mild improvement after  last session. Pain still mostly in left levator and upper trapezius region with some intermittent numbness on right side.    Patient is accompained by: Family member   daughter   Pertinent History cervical fusion, sarcoidosis, Covid infection with hospitalization x 5 days in November 2020, arthritis, craniotomy, TAVR    Currently in Pain? Yes    Pain Location Neck    Pain Orientation Left    Pain Descriptors / Indicators Dull;Aching    Pain Type Chronic pain;Acute pain   acute on chronic   Pain Radiating Towards left upper trapezius and periscapular region    Pain Onset More than a month ago    Pain  Frequency Constant    Aggravating Factors  turning head, lifting    Pain Relieving Factors past relief with PT                             [facility] Adult PT Treatment/Exercise - 01/30/20 0001      Exercises   Exercises Neck      Neck Exercises: Theraband   Shoulder Extension 15 reps;Red    Rows 15 reps;Red    Horizontal ABduction 15 reps;Red    Horizontal ABduction Limitations supine      Neck Exercises: Supine   Neck Retraction 15 reps    Neck Retraction Limitations with therapist assist    Capital Flexion Limitations retraction to assisted flexion x 15 reps      Modalities   Modalities Moist Heat      Moist Heat Therapy   Number Minutes Moist Heat 10 Minutes    Moist Heat Location Cervical      Manual Therapy   Manual Therapy Soft tissue mobilization;Myofascial release    Soft tissue mobilization seated STM left levator and upper trapezius region    Myofascial Release suboccipital release            Trigger Point Dry Needling - 01/30/20 0001    Consent Given? Yes    Muscles Treated Head and Neck Upper trapezius;Levator scapulae    Muscles Treated Upper Quadrant Supraspinatus;Infraspinatus    Dry Needling Comments needling to left side muscles only in right sidelying with 32 gauge 30 mm needles    Electrical Stimulation Performed with Dry Needling Yes    E-stim with Dry Needling Details TENS 20 pps x 10 minutes                PT Education - 01/30/20 1402    Education Details exercises, POC    Person(s) Educated Patient;Child(ren)    Methods Explanation;Demonstration;Verbal cues    Comprehension Returned demonstration;Verbalized understanding               PT Long Term Goals - 01/15/20 1612      PT LONG TERM GOAL #1   Title Independent with HEP    Baseline needs HEP    Time 6    Period Weeks    Status New    Target Date 02/26/20      PT LONG TERM GOAL #2   Title Increase right cervical rotation AROM to 50 deg or greater to  improve ability to turn head for ADLs, safety with community ambulation and crossing street    Baseline 41 deg    Time 6    Period Weeks    Status New    Target Date 02/26/20      PT LONG TERM GOAL #3   Title  Improve FOTO outcome measure score to 40% or less impairment    Baseline 46% limited    Time 6    Period Weeks    Status New    Target Date 02/26/20      PT LONG TERM GOAL #4   Title Tolerate sitting for eating meals and car travel periods at least 20-30 min with pain 2/10 or less    Baseline 4/10 with higher pain level intermittently    Time 6    Period Weeks    Status New    Target Date 02/26/20      PT LONG TERM GOAL #5   Title Decrease sleep disturbance symptoms at least 50% or greater from current status    Time 6    Period Weeks    Status New    Target Date 02/26/20                 Plan - 01/30/20 1403    Clinical Impression Statement Mild improvement after initial tx. at eval. Left upper trapezius/levator region pain still consistent with myofascial etiology and still some concern radicular etiology for RUE symptoms. Expect response will be gradual given age and comorbidities-plan continue PT for further progress to help relieve pain and address associated functional limitations.    Personal Factors and Comorbidities Age;Comorbidity 3+;Past/Current Experience    Comorbidities see PMH    Examination-Activity Limitations Sit;Sleep;Lift;Carry    Stability/Clinical Decision Making Evolving/Moderate complexity    Clinical Decision Making Moderate    Rehab Potential Good    PT Frequency 2x / week    PT Duration 6 weeks    PT Treatment/Interventions ADLs/Self Care Home Management;Cryotherapy;Ultrasound;Electrical Stimulation;Moist Heat;Therapeutic activities;Neuromuscular re-education;Therapeutic exercise;Patient/family education;Manual techniques;Dry needling;Taping    PT Next Visit Plan STM to left upper trapezius and periscapular region, continue dry  needling, cervical retractions and flexion bias ROM, stretches, postural strengthening, modalities prn for pain    PT Home Exercise Plan [id number]    Consulted and Agree with Plan of Care Patient;Family member/caregiver           Patient will benefit from skilled therapeutic intervention in order to improve the following deficits and impairments:  Pain, Postural dysfunction, Decreased activity tolerance, Decreased range of motion, Increased muscle spasms, Impaired sensation  Visit Diagnosis: Cervicalgia  Abnormal posture  Other abnormalities of gait and mobility     Problem List Patient Active Problem List   Diagnosis Date Noted  . Hypotension 06/08/2019  . Severe sepsis ([place]) 06/08/2019  . Acute respiratory failure with hypoxia ([place]) 06/07/2019  . Pneumonia due to COVID-19 virus 06/07/2019  . Acute on chronic kidney failure ([place])   . Chronic diastolic CHF (congestive heart failure) ([place])   . History of traumatic subdural hematoma   . S/P TAVR (transcatheter aortic valve replacement)   . Severe aortic stenosis   . Neck arthritis 09/08/2018  . Multiple fractures of ribs of left side 12/24/2013  . Urinary retention 12/24/2013  . Hypokalemia 07/12/2013  . CAD (coronary artery disease) 04/20/2013  . Hypothyroidism 07/09/2010  . CONSTIPATION 07/09/2010  . HEMATEMESIS 08/14/2008  . Sarcoidosis 08/09/2008  . Hyperlipidemia 08/09/2008  . Anxiety 08/09/2008  . Hypertension 08/09/2008  . HEMORRHOIDS 08/09/2008  . GERD 08/09/2008  . BARRETTS ESOPHAGUS 08/09/2008  . Irritable bowel syndrome 08/09/2008  . DEGENERATIVE JOINT DISEASE 08/09/2008  . COLONIC POLYPS, ADENOMATOUS, HX OF 08/09/2008  . HELICOBACTER PYLORI GASTRITIS, HX OF 08/09/2008    Beaulah Dinning, PT, DPT 01/30/20 2:07 PM  [place]  [facility] [address] [place], [place], [address] Phone: [phone]   Fax:  [phone]  Name: Brenda [name] MRN:  [id number] Date of Birth: 1930-11-24

## 2020-02-06 ENCOUNTER — Ambulatory Visit: Payer: Medicare Other | Admitting: Physical Therapy

## 2020-02-13 ENCOUNTER — Ambulatory Visit: Payer: Medicare Other | Admitting: Physical Therapy

## 2020-03-10 NOTE — Therapy (Signed)
[place], [place], [address] Phone: [phone]   Fax:  [phone]  Physical Therapy Treatment/Discharge  Patient Details  Name: Brenda [name] MRN: [id number] Date of Birth: [birth year] Referring Provider (PT): Hulan Saas, DO   Encounter Date: 01/30/2020    Past Medical History:  Diagnosis Date  . Anxiety   . Asthma   . Barrett's esophagus   . Cancer ([place])   . DJD (degenerative joint disease)   . GERD (gastroesophageal reflux disease)   . Goiter   . Hemorrhoids   . History of colonoscopy   . History of shingles    face/eye  . History of subdural hematoma   . History of traumatic subdural hematoma   . Hx of colonic polyps   . Hyperlipidemia   . Hypertension   . IBS (irritable bowel syndrome)   . Osteoarthritis   . S/P TAVR (transcatheter aortic valve replacement)    23 mm Edwards Sapien 3 transcatheter heart valve placed via percutaneous right transfemoral approach   . Sarcoidosis   . [facility])     Past Surgical History:  Procedure Laterality Date  . BREAST LUMPECTOMY  1974   left  . CATARACT EXTRACTION W/ INTRAOCULAR LENS  IMPLANT, BILATERAL    . CRANIOTOMY Right 01/04/2014   Procedure: CRANIOTOMY HEMATOMA EVACUATION SUBDURAL;  Surgeon: [name], MD;  Location: [place] [facility];  Service: Neurosurgery;  Laterality: Right;  . CRANIOTOMY N/A 01/08/2014   Procedure: Redo CRANIOTOMY HEMATOMA EVACUATION SUBDURAL RIGHT;  Surgeon: [name], MD;  Location: [place] [facility];  Service: Neurosurgery;  Laterality: N/A;  Redo CRANIOTOMY HEMATOMA EVACUATION SUBDURAL RIGHT  . CRANIOTOMY Right 01/10/2014   Procedure: Redo CRANIOTOMY HEMATOMA EVACUATION SUBDURAL;  Surgeon: [name], MD;  Location: [place] [facility];  Service: Neurosurgery;  Laterality: Right;  . DILATION AND CURETTAGE OF UTERUS    . EYE SURGERY    . NASAL SINUS SURGERY    . NECK SURGERY     x 2  . RIGHT/LEFT HEART CATH AND  CORONARY ANGIOGRAPHY N/A 09/27/2018   Procedure: RIGHT/LEFT HEART CATH AND CORONARY ANGIOGRAPHY;  Surgeon: Burnell Blanks, MD;  Location: [place] [facility];  Service: Cardiovascular;  Laterality: N/A;  . TEE WITHOUT CARDIOVERSION N/A 11/28/2018   Procedure: TRANSESOPHAGEAL ECHOCARDIOGRAM (TEE);  Surgeon: Sherren Mocha, MD;  Location: [facility];  Service: Open Heart Surgery;  Laterality: N/A;  . TONSILLECTOMY AND ADENOIDECTOMY  1976  . TOTAL THYROIDECTOMY  06-05-08  . TRANSCATHETER AORTIC VALVE REPLACEMENT, TRANSFEMORAL N/A 11/28/2018   Procedure: TRANSCATHETER AORTIC VALVE REPLACEMENT, TRANSFEMORAL;  Surgeon: Sherren Mocha, MD;  Location: [facility];  Service: Open Heart Surgery;  Laterality: N/A;    There were no vitals filed for this visit.                                   PT Long Term Goals - 01/15/20 1612      PT LONG TERM GOAL #1   Title Independent with HEP    Baseline needs HEP    Time 6    Period Weeks    Status New    Target Date 02/26/20      PT LONG TERM GOAL #2   Title Increase right cervical rotation AROM to 50 deg or greater to improve ability to turn head for ADLs, safety with community ambulation and crossing street  Baseline 41 deg    Time 6    Period Weeks    Status New    Target Date 02/26/20      PT LONG TERM GOAL #3   Title Improve FOTO outcome measure score to 40% or less impairment    Baseline 46% limited    Time 6    Period Weeks    Status New    Target Date 02/26/20      PT LONG TERM GOAL #4   Title Tolerate sitting for eating meals and car travel periods at least 20-30 min with pain 2/10 or less    Baseline 4/10 with higher pain level intermittently    Time 6    Period Weeks    Status New    Target Date 02/26/20      PT LONG TERM GOAL #5   Title Decrease sleep disturbance symptoms at least 50% or greater from current status    Time 6    Period Weeks    Status New    Target Date 02/26/20                   Patient will benefit from skilled therapeutic intervention in order to improve the following deficits and impairments:  Pain, Postural dysfunction, Decreased activity tolerance, Decreased range of motion, Increased muscle spasms, Impaired sensation  Visit Diagnosis: Cervicalgia  Abnormal posture  Other abnormalities of gait and mobility     Problem List Patient Active Problem List   Diagnosis Date Noted  . Hypotension 06/08/2019  . Severe sepsis ([place]) 06/08/2019  . Acute respiratory failure with hypoxia ([place]) 06/07/2019  . Pneumonia due to COVID-19 virus 06/07/2019  . Acute on chronic kidney failure ([place])   . Chronic diastolic CHF (congestive heart failure) ([place])   . History of traumatic subdural hematoma   . S/P TAVR (transcatheter aortic valve replacement)   . Severe aortic stenosis   . Neck arthritis 09/08/2018  . Multiple fractures of ribs of left side 12/24/2013  . Urinary retention 12/24/2013  . Hypokalemia 07/12/2013  . CAD (coronary artery disease) 04/20/2013  . Hypothyroidism 07/09/2010  . CONSTIPATION 07/09/2010  . HEMATEMESIS 08/14/2008  . Sarcoidosis 08/09/2008  . Hyperlipidemia 08/09/2008  . Anxiety 08/09/2008  . Hypertension 08/09/2008  . HEMORRHOIDS 08/09/2008  . GERD 08/09/2008  . BARRETTS ESOPHAGUS 08/09/2008  . Irritable bowel syndrome 08/09/2008  . DEGENERATIVE JOINT DISEASE 08/09/2008  . COLONIC POLYPS, ADENOMATOUS, HX OF 08/09/2008  . HELICOBACTER PYLORI GASTRITIS, HX OF 08/09/2008       PHYSICAL THERAPY DISCHARGE SUMMARY  Visits from Start of Care: 2  Current functional level related to goals / functional outcomes: Patient did not return for further therapy visits after last session 01/30/20. Current status unknown.   Remaining deficits: NA   Education / Equipment: HEP at eval Plan: Patient agrees to discharge.  Patient goals were not met. Patient is being discharged due to not returning since the last visit.  ?????            Beaulah Dinning, PT, DPT 03/10/20 10:54 AM     [facility] [address] [place], [place], [address] Phone: [phone]   Fax:  [phone]  Name: Brenda [name] MRN: [id number] Date of Birth: 09-18-1930

## 2020-03-14 DIAGNOSIS — Z953 Presence of xenogenic heart valve: Secondary | ICD-10-CM | POA: Diagnosis not present

## 2020-03-14 DIAGNOSIS — N183 Chronic kidney disease, stage 3 unspecified: Secondary | ICD-10-CM | POA: Diagnosis not present

## 2020-03-14 DIAGNOSIS — E7800 Pure hypercholesterolemia, unspecified: Secondary | ICD-10-CM | POA: Diagnosis not present

## 2020-03-14 DIAGNOSIS — F419 Anxiety disorder, unspecified: Secondary | ICD-10-CM | POA: Diagnosis not present

## 2020-03-14 DIAGNOSIS — I10 Essential (primary) hypertension: Secondary | ICD-10-CM | POA: Diagnosis not present

## 2020-03-14 DIAGNOSIS — D649 Anemia, unspecified: Secondary | ICD-10-CM | POA: Diagnosis not present

## 2020-03-14 DIAGNOSIS — Z0000 Encounter for general adult medical examination without abnormal findings: Secondary | ICD-10-CM | POA: Diagnosis not present

## 2020-03-14 DIAGNOSIS — Z87898 Personal history of other specified conditions: Secondary | ICD-10-CM | POA: Diagnosis not present

## 2020-03-14 DIAGNOSIS — E039 Hypothyroidism, unspecified: Secondary | ICD-10-CM | POA: Diagnosis not present

## 2020-03-14 DIAGNOSIS — M47812 Spondylosis without myelopathy or radiculopathy, cervical region: Secondary | ICD-10-CM | POA: Diagnosis not present

## 2020-03-19 DIAGNOSIS — M1990 Unspecified osteoarthritis, unspecified site: Secondary | ICD-10-CM | POA: Diagnosis not present

## 2020-03-19 DIAGNOSIS — E039 Hypothyroidism, unspecified: Secondary | ICD-10-CM | POA: Diagnosis not present

## 2020-03-19 DIAGNOSIS — E890 Postprocedural hypothyroidism: Secondary | ICD-10-CM | POA: Diagnosis not present

## 2020-03-19 DIAGNOSIS — I10 Essential (primary) hypertension: Secondary | ICD-10-CM | POA: Diagnosis not present

## 2020-03-19 DIAGNOSIS — I509 Heart failure, unspecified: Secondary | ICD-10-CM | POA: Diagnosis not present

## 2020-03-19 DIAGNOSIS — M47812 Spondylosis without myelopathy or radiculopathy, cervical region: Secondary | ICD-10-CM | POA: Diagnosis not present

## 2020-03-19 DIAGNOSIS — E782 Mixed hyperlipidemia: Secondary | ICD-10-CM | POA: Diagnosis not present

## 2020-03-19 DIAGNOSIS — E7800 Pure hypercholesterolemia, unspecified: Secondary | ICD-10-CM | POA: Diagnosis not present

## 2020-03-19 DIAGNOSIS — N183 Chronic kidney disease, stage 3 unspecified: Secondary | ICD-10-CM | POA: Diagnosis not present

## 2020-03-19 DIAGNOSIS — N189 Chronic kidney disease, unspecified: Secondary | ICD-10-CM | POA: Diagnosis not present

## 2020-03-19 DIAGNOSIS — I2510 Atherosclerotic heart disease of native coronary artery without angina pectoris: Secondary | ICD-10-CM | POA: Diagnosis not present

## 2020-04-02 DIAGNOSIS — I2510 Atherosclerotic heart disease of native coronary artery without angina pectoris: Secondary | ICD-10-CM | POA: Diagnosis not present

## 2020-04-02 DIAGNOSIS — M1990 Unspecified osteoarthritis, unspecified site: Secondary | ICD-10-CM | POA: Diagnosis not present

## 2020-04-02 DIAGNOSIS — E890 Postprocedural hypothyroidism: Secondary | ICD-10-CM | POA: Diagnosis not present

## 2020-04-02 DIAGNOSIS — E7800 Pure hypercholesterolemia, unspecified: Secondary | ICD-10-CM | POA: Diagnosis not present

## 2020-04-02 DIAGNOSIS — N183 Chronic kidney disease, stage 3 unspecified: Secondary | ICD-10-CM | POA: Diagnosis not present

## 2020-04-02 DIAGNOSIS — I10 Essential (primary) hypertension: Secondary | ICD-10-CM | POA: Diagnosis not present

## 2020-04-02 DIAGNOSIS — M47812 Spondylosis without myelopathy or radiculopathy, cervical region: Secondary | ICD-10-CM | POA: Diagnosis not present

## 2020-04-02 DIAGNOSIS — I509 Heart failure, unspecified: Secondary | ICD-10-CM | POA: Diagnosis not present

## 2020-04-02 DIAGNOSIS — E782 Mixed hyperlipidemia: Secondary | ICD-10-CM | POA: Diagnosis not present

## 2020-04-02 DIAGNOSIS — N189 Chronic kidney disease, unspecified: Secondary | ICD-10-CM | POA: Diagnosis not present

## 2020-04-02 DIAGNOSIS — E039 Hypothyroidism, unspecified: Secondary | ICD-10-CM | POA: Diagnosis not present

## 2020-04-09 DIAGNOSIS — L820 Inflamed seborrheic keratosis: Secondary | ICD-10-CM | POA: Diagnosis not present

## 2020-04-09 DIAGNOSIS — C44319 Basal cell carcinoma of skin of other parts of face: Secondary | ICD-10-CM | POA: Diagnosis not present

## 2020-04-09 DIAGNOSIS — D485 Neoplasm of uncertain behavior of skin: Secondary | ICD-10-CM | POA: Diagnosis not present

## 2020-06-04 NOTE — Progress Notes (Deleted)
Pt is a no show

## 2020-06-05 ENCOUNTER — Ambulatory Visit: Payer: Medicare Other | Admitting: Internal Medicine

## 2020-06-09 DIAGNOSIS — I2510 Atherosclerotic heart disease of native coronary artery without angina pectoris: Secondary | ICD-10-CM | POA: Diagnosis not present

## 2020-06-09 DIAGNOSIS — M1990 Unspecified osteoarthritis, unspecified site: Secondary | ICD-10-CM | POA: Diagnosis not present

## 2020-06-09 DIAGNOSIS — I509 Heart failure, unspecified: Secondary | ICD-10-CM | POA: Diagnosis not present

## 2020-06-09 DIAGNOSIS — I10 Essential (primary) hypertension: Secondary | ICD-10-CM | POA: Diagnosis not present

## 2020-06-09 DIAGNOSIS — E890 Postprocedural hypothyroidism: Secondary | ICD-10-CM | POA: Diagnosis not present

## 2020-06-09 DIAGNOSIS — E7800 Pure hypercholesterolemia, unspecified: Secondary | ICD-10-CM | POA: Diagnosis not present

## 2020-06-09 DIAGNOSIS — E039 Hypothyroidism, unspecified: Secondary | ICD-10-CM | POA: Diagnosis not present

## 2020-06-09 DIAGNOSIS — N183 Chronic kidney disease, stage 3 unspecified: Secondary | ICD-10-CM | POA: Diagnosis not present

## 2020-06-09 DIAGNOSIS — N189 Chronic kidney disease, unspecified: Secondary | ICD-10-CM | POA: Diagnosis not present

## 2020-06-09 DIAGNOSIS — E782 Mixed hyperlipidemia: Secondary | ICD-10-CM | POA: Diagnosis not present

## 2020-06-09 DIAGNOSIS — M47812 Spondylosis without myelopathy or radiculopathy, cervical region: Secondary | ICD-10-CM | POA: Diagnosis not present

## 2020-07-04 DIAGNOSIS — N183 Chronic kidney disease, stage 3 unspecified: Secondary | ICD-10-CM | POA: Diagnosis not present

## 2020-07-04 DIAGNOSIS — I2510 Atherosclerotic heart disease of native coronary artery without angina pectoris: Secondary | ICD-10-CM | POA: Diagnosis not present

## 2020-07-04 DIAGNOSIS — E7800 Pure hypercholesterolemia, unspecified: Secondary | ICD-10-CM | POA: Diagnosis not present

## 2020-07-04 DIAGNOSIS — E782 Mixed hyperlipidemia: Secondary | ICD-10-CM | POA: Diagnosis not present

## 2020-07-04 DIAGNOSIS — I10 Essential (primary) hypertension: Secondary | ICD-10-CM | POA: Diagnosis not present

## 2020-07-04 DIAGNOSIS — M1990 Unspecified osteoarthritis, unspecified site: Secondary | ICD-10-CM | POA: Diagnosis not present

## 2020-07-04 DIAGNOSIS — E890 Postprocedural hypothyroidism: Secondary | ICD-10-CM | POA: Diagnosis not present

## 2020-07-04 DIAGNOSIS — I509 Heart failure, unspecified: Secondary | ICD-10-CM | POA: Diagnosis not present

## 2020-07-04 DIAGNOSIS — N189 Chronic kidney disease, unspecified: Secondary | ICD-10-CM | POA: Diagnosis not present

## 2020-07-04 DIAGNOSIS — E039 Hypothyroidism, unspecified: Secondary | ICD-10-CM | POA: Diagnosis not present

## 2020-07-04 DIAGNOSIS — M47812 Spondylosis without myelopathy or radiculopathy, cervical region: Secondary | ICD-10-CM | POA: Diagnosis not present

## 2020-07-10 ENCOUNTER — Other Ambulatory Visit: Payer: Self-pay | Admitting: Physician Assistant

## 2020-07-16 IMAGING — CR CHEST - 2 VIEW
2 series · 2 of 2 positions shown · non-contrast
Comparison: 11/03/2017

CLINICAL DATA: Preop for aortic stenosis

EXAM:
CHEST - 2 VIEW

[chest pa]
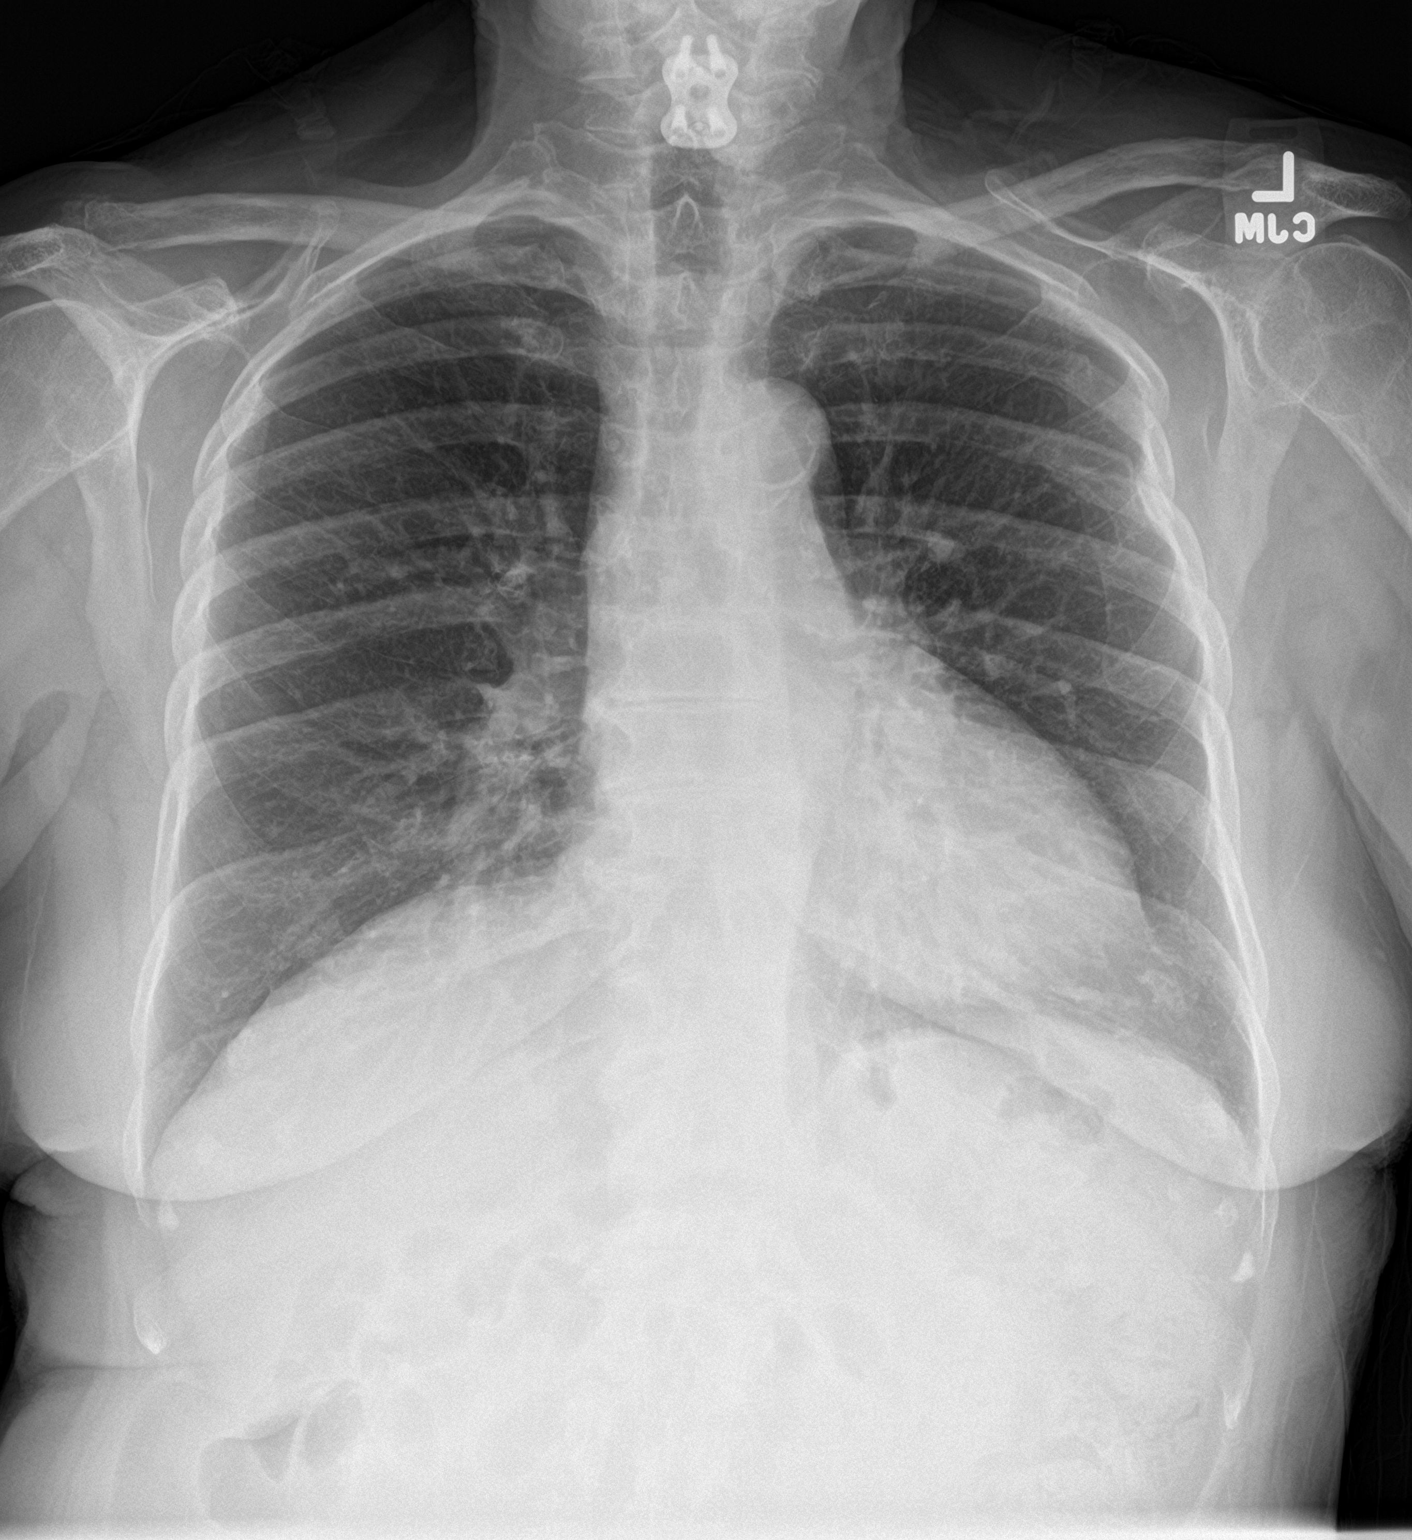

[chest lat]
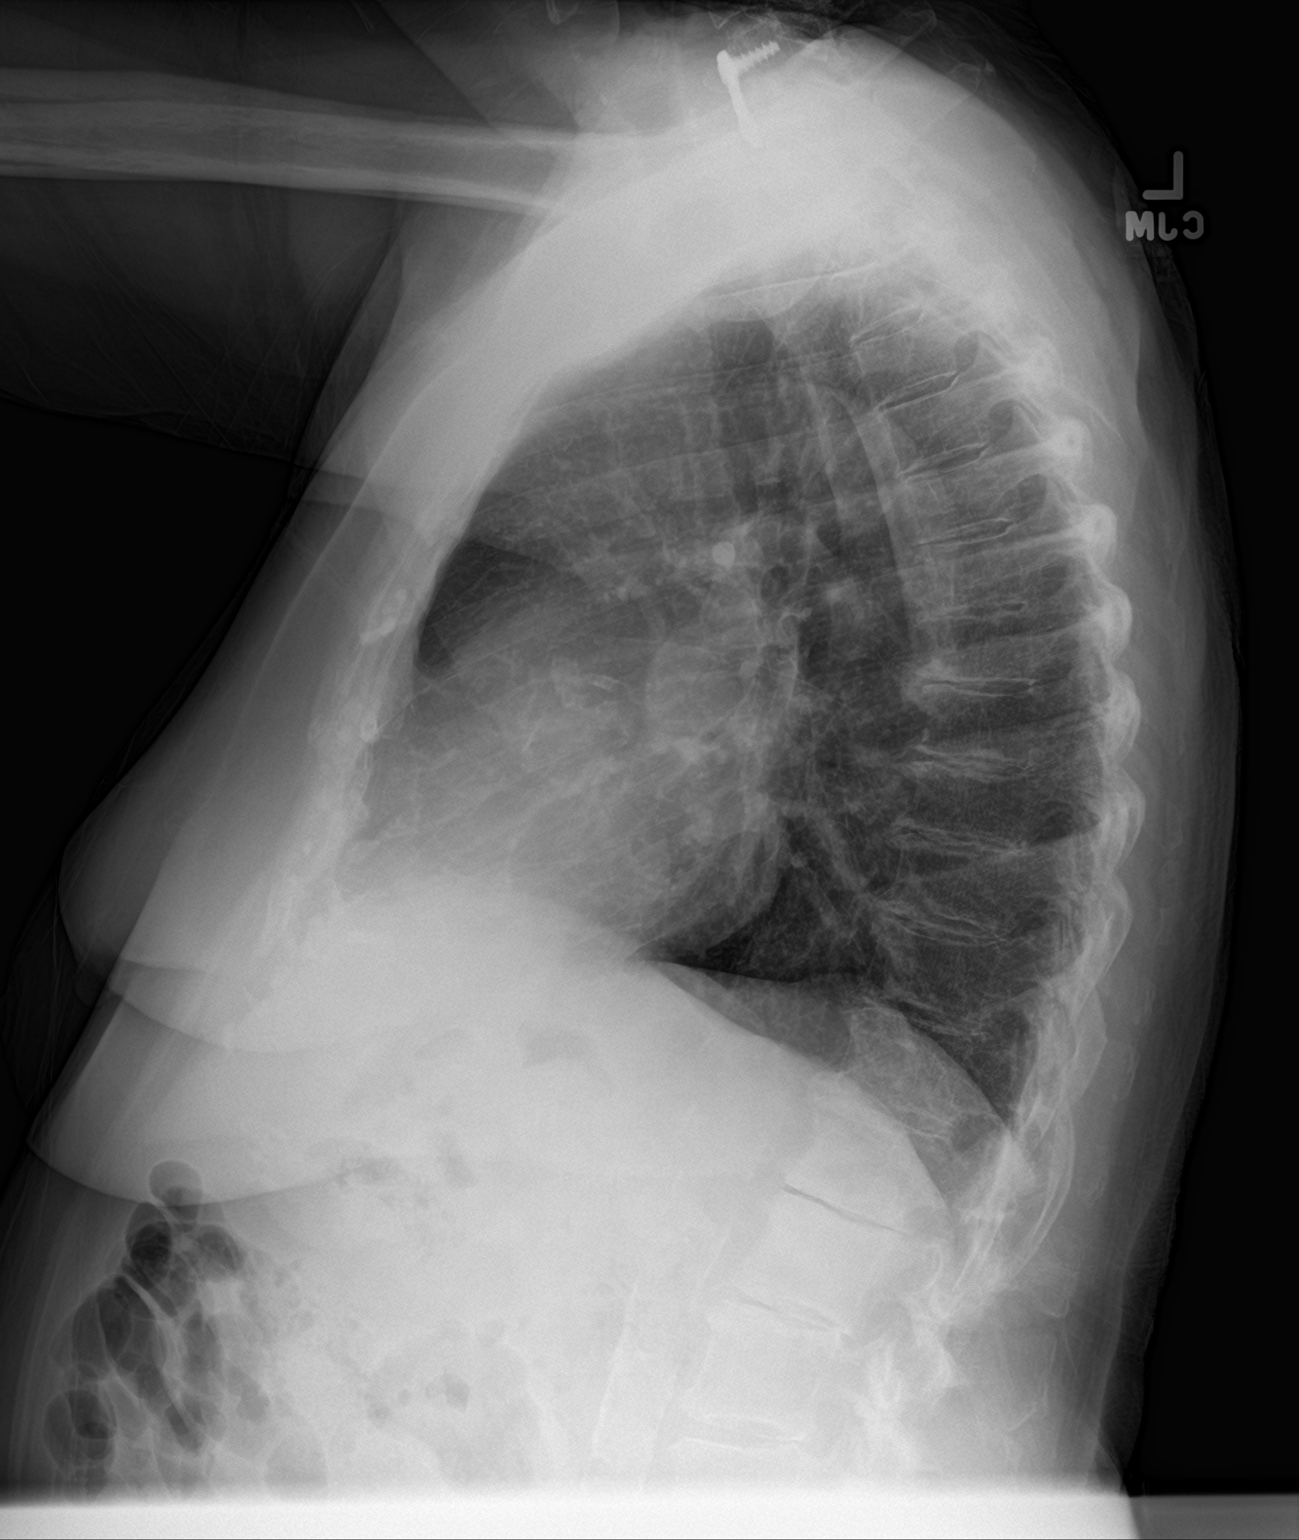

[2 of 2 positions shown; findings below may reference images not displayed]

FINDINGS: Normal heart size. Lungs clear. No pneumothorax. No pleural
effusion. Chronic healed left upper lateral rib fractures with
deformity.
IMPRESSION: No active cardiopulmonary disease.

## 2020-07-21 NOTE — Progress Notes (Signed)
[name] [address] [place] [address] Phone: [phone] Subjective:   Brenda [name], am serving as a scribe for Dr. Antoine [name]. This visit occurred during the SARS-CoV-2 public health emergency.  Safety protocols were in place, including screening questions prior to the visit, additional usage of staff PPE, and extensive cleaning of exam room while observing appropriate contact time as indicated for disinfecting solutions.   I'm seeing this patient by the request  of:  Brenda Gosselin, MD  CC: neck pain and mass follow up   [id number]   01/23/2020 86 year old female who is having some response to physical therapy as well as gabapentin at this moment.  Secondary to patient's other comorbidities and social determinants of health with recent loss of husband we did this more virtually.  Discussed the gabapentin would be fine at this moment but only 300 at night and not the daytime dosing.  Patient will continue with physical therapy.  Patient will check in with me again via MyChart in the next 2 to 3 weeks and then after that we will discuss if needed to see patient in office to consider the possibility of injections such as trigger point.  Update 07/22/2020 Brenda [name] is a 86 y.o. female coming in with complaint of neck pain. Pain is not as bad as it once was. Noticing a knot in left upper trap which may be affecting her ability to swallow larger pills. Stopped using gabapentin due to confusion.  Patient is also having left leg tingling in upper leg for past 5-6 weeks: both quad and hamstring. Also complains of muscle cramps in gastrocnemius muscle which have increased since the tingling symptoms began.  Patient has had more complaints recently.  Has changed and is now living at friend's home.     Lats time seen in person was Jult of 2020 due to COVID and passing of her husband.  Has been taking gabapentin 300mg  at night- initially did  respond to PT and gabapentin.   CT Neck from 2017- Severe facet hypertrophy Left C1-2 and Left C4-5 which is where this is consistent with patient's area of where patient has had tenderness.  Had COVID 05/2019  Past Medical History:  Diagnosis Date  . Anxiety   . Asthma   . Barrett's esophagus   . Cancer ([facility])   . DJD (degenerative joint disease)   . GERD (gastroesophageal reflux disease)   . Goiter   . Hemorrhoids   . History of colonoscopy   . History of shingles    face/eye  . History of subdural hematoma   . History of traumatic subdural hematoma   . Hx of colonic polyps   . Hyperlipidemia   . Hypertension   . IBS (irritable bowel syndrome)   . Osteoarthritis   . S/P TAVR (transcatheter aortic valve replacement)    23 mm Edwards Sapien 3 transcatheter heart valve placed via percutaneous right transfemoral approach   . Sarcoidosis   . [facility])    Past Surgical History:  Procedure Laterality Date  . BREAST LUMPECTOMY  1974   left  . CATARACT EXTRACTION W/ INTRAOCULAR LENS  IMPLANT, BILATERAL    . CRANIOTOMY Right 01/04/2014   Procedure: CRANIOTOMY HEMATOMA EVACUATION SUBDURAL;  Surgeon: 03/06/2014, MD;  Location: [facility];  Service: Neurosurgery;  Laterality: Right;  . CRANIOTOMY N/A 01/08/2014   Procedure: Redo CRANIOTOMY HEMATOMA EVACUATION SUBDURAL RIGHT;  Surgeon: 01/10/2014, MD;  [facility]: [facility];  Service: Neurosurgery;  Laterality: N/A;  Redo CRANIOTOMY HEMATOMA EVACUATION SUBDURAL RIGHT  . CRANIOTOMY Right 01/10/2014   Procedure: Redo CRANIOTOMY HEMATOMA EVACUATION SUBDURAL;  Surgeon: [name], MD;  Location: [place] [facility];  Service: Neurosurgery;  Laterality: Right;  . DILATION AND CURETTAGE OF UTERUS    . EYE SURGERY    . NASAL SINUS SURGERY    . NECK SURGERY     x 2  . RIGHT/LEFT HEART CATH AND CORONARY ANGIOGRAPHY N/A 09/27/2018   Procedure: RIGHT/LEFT HEART CATH AND CORONARY ANGIOGRAPHY;  Surgeon: Burnell Blanks, MD;   Location: [place] CV LAB;  Service: Cardiovascular;  Laterality: N/A;  . TEE WITHOUT CARDIOVERSION N/A 11/28/2018   Procedure: TRANSESOPHAGEAL ECHOCARDIOGRAM (TEE);  Surgeon: Sherren Mocha, MD;  Location: [place];  Service: Open Heart Surgery;  Laterality: N/A;  . TONSILLECTOMY AND ADENOIDECTOMY  1976  . TOTAL THYROIDECTOMY  06-05-08  . TRANSCATHETER AORTIC VALVE REPLACEMENT, TRANSFEMORAL N/A 11/28/2018   Procedure: TRANSCATHETER AORTIC VALVE REPLACEMENT, TRANSFEMORAL;  Surgeon: Sherren Mocha, MD;  Location: [place] [place];  Service: Open Heart Surgery;  Laterality: N/A;   Social History   Socioeconomic History  . Marital status: Married    Spouse name: Not on file  . Number of children: 4  . Years of education: Not on file  . Highest education level: Not on file  Occupational History  . Occupation: Retired    Fish farm manager: RETIRED    Comment: worked for [employer]  . Smoking status: Never Smoker  . Smokeless tobacco: Never Used  Vaping Use  . Vaping Use: Never used  Substance and Sexual Activity  . Alcohol use: Yes    Comment: Occ glass of wine with dinner  . Drug use: No  . Sexual activity: Not on file  Other Topics Concern  . Not on file  Social History Narrative  . Not on file   Social Determinants of Health   Financial Resource Strain: Not on file  Food Insecurity: Not on file  Transportation Needs: Not on file  Physical Activity: Not on file  Stress: Not on file  Social Connections: Not on file   No Known Allergies Family History  Problem Relation Age of Onset  . Hypertension Father   . Hyperlipidemia Father   . Rheum arthritis Father   . Heart failure Father   . Heart disease Father   . Hypertension Mother   . Hyperlipidemia Mother   . Kidney cancer Mother   . Lymphoma Mother   . Hypertension Brother   . Hypertension Sister   . Allergies Sister   . Asthma Sister   . Allergies Sister   . Thyroid cancer Sister   . Allergies Sister   .  Colon cancer Sister   . Allergies Sister   . Kidney cancer Maternal Grandmother     Current Outpatient Medications (Endocrine & Metabolic):  .  levothyroxine (SYNTHROID) 75 MCG tablet, Take 1 tablet (75 mcg total) by mouth daily.  Current Outpatient Medications (Cardiovascular):  .  pravastatin (PRAVACHOL) 10 MG tablet, TAKE 1 TABLET(10 MG) BY MOUTH EVERY EVENING  Current Outpatient Medications (Respiratory):  Marland Kitchen  Fluticasone Propionate (FLONASE ALLERGY RELIEF NA), Place 1 spray into the nose daily.   Current Outpatient Medications (Analgesics):  .  acetaminophen (TYLENOL) 650 MG CR tablet, Take 650 mg by mouth daily.  Marland Kitchen  aspirin 81 MG chewable tablet, Chew 1 tablet (81 mg total) by mouth daily.  Current Outpatient Medications (Hematological):  Marland Kitchen  vitamin B-12 (CYANOCOBALAMIN) 1000 MCG tablet, Take 1,000 mcg by mouth at bedtime.  Current Outpatient Medications (Other):  Marland Kitchen  ALPRAZolam (XANAX) 0.5 MG tablet, Take 0.5 mg by mouth See admin instructions. Take 0.5 mg in the morning and 1 mg at night .  amoxicillin (AMOXIL) 500 MG tablet, Take 4 capsules (2,000 mg) one hour prior to all dental visits. Marland Kitchen  Bioflavonoid Products (VITAMIN C PLUS) 1000 MG TABS, Take by mouth. .  Biotin 5000 MCG TABS, Take 5,000 mcg by mouth at bedtime.  .  Cholecalciferol (VITAMIN D) 125 MCG (5000 UT) CAPS, Take 5,000 Units by mouth at bedtime.  .  cycloSPORINE (RESTASIS) 0.05 % ophthalmic emulsion, Place 1 drop into both eyes every 12 (twelve) hours. .  diclofenac sodium (VOLTAREN) 1 % GEL, Apply 2 g topically daily. To affected joint. Marland Kitchen  gabapentin (NEURONTIN) 300 MG capsule, Take 300 mg by mouth at bedtime. .  Liniments (SALONPAS PAIN RELIEF PATCH EX), Apply 1 patch topically daily. .  Menthol, Topical Analgesic, (BLUE-EMU MAXIMUM STRENGTH EX), Apply 1 application topically daily as needed (knee pain). Marland Kitchen  senna-docusate (SENOKOT-S) 8.6-50 MG tablet, Take 3 tablets by mouth at bedtime.  .  Zinc 50 MG CAPS,  Take by mouth.   Reviewed prior external information including notes and imaging from  primary care provider As well as notes that were available from care everywhere and other healthcare systems.  Past medical history, social, surgical and family history all reviewed in electronic medical record.  No pertanent information unless stated regarding to the chief complaint.   Review of Systems:  No headache, visual changes, nausea, vomiting, diarrhea, constipation, dizziness, abdominal pain, skin rash, fevers, chills, night sweats, weight loss, swollen lymph nodes, body aches, joint swelling, chest pain, shortness of breath, mood changes. POSITIVE muscle aches  Objective  Blood pressure 132/82, pulse 65, height 5\' 3"  (1.6 m), weight 132 lb (59.9 kg), SpO2 93 %.   General: No apparent distress alert but patient is not oriented.  Patient is accompanied with daughter HEENT: Pupils equal, extraocular movements intact  Respiratory: Patient's speak in full sentences and does not appear short of breath no stridor Cardiovascular: No lower extremity edema, non tender, no erythema  Patient's neck exam does have some loss of lordosis.  Still has the tightness noted especially over the C4, C5 and C6 on the left side.  Does have a bony abnormality in the area.  Limited range of motion.  Patient is able to swallow. Patient has symmetric strength of the upper extremities bilaterally.  Low back exam diffusely tender to palpation in the paraspinal musculature.  Patient has mild tightness with straight leg test but 4 out of 5 strength but symmetric of the legs.  Patient does have some mild atrophy that is consistent with deconditioning   Impression and Recommendations:     The above documentation has been reviewed and is accurate and complete Lyndal Pulley, DO

## 2020-07-22 ENCOUNTER — Ambulatory Visit (INDEPENDENT_AMBULATORY_CARE_PROVIDER_SITE_OTHER): Payer: Medicare Other

## 2020-07-22 ENCOUNTER — Encounter: Payer: Self-pay | Admitting: Family Medicine

## 2020-07-22 ENCOUNTER — Other Ambulatory Visit: Payer: Self-pay

## 2020-07-22 ENCOUNTER — Ambulatory Visit (INDEPENDENT_AMBULATORY_CARE_PROVIDER_SITE_OTHER): Payer: Medicare Other | Admitting: Family Medicine

## 2020-07-22 VITALS — BP 132/82 | HR 65 | Ht 63.0 in | Wt 132.0 lb

## 2020-07-22 DIAGNOSIS — M545 Low back pain, unspecified: Secondary | ICD-10-CM

## 2020-07-22 DIAGNOSIS — M542 Cervicalgia: Secondary | ICD-10-CM

## 2020-07-22 DIAGNOSIS — M5030 Other cervical disc degeneration, unspecified cervical region: Secondary | ICD-10-CM | POA: Insufficient documentation

## 2020-07-22 DIAGNOSIS — M5136 Other intervertebral disc degeneration, lumbar region: Secondary | ICD-10-CM | POA: Diagnosis not present

## 2020-07-22 DIAGNOSIS — R2689 Other abnormalities of gait and mobility: Secondary | ICD-10-CM

## 2020-07-22 DIAGNOSIS — M47812 Spondylosis without myelopathy or radiculopathy, cervical region: Secondary | ICD-10-CM

## 2020-07-22 NOTE — Assessment & Plan Note (Signed)
Patient does have significant degenerative disc disease.  Multiple different comorbidities which does make this difficult to treat.  Patient has had some mild increase in anxiety that is also amplified patient's pain.  Patient still feels that if anything she is starting to have a bigger bone spur on the left side.  He states that sometimes feels like she is having some difficulty with swallowing.  Patient is accompanied with her today and can tell that there is some mild dementia that seems to be worsening.  We discussed different treatment options including x-rays which patient as well as daughter has agreed with as well as starting physical therapy.  We discussed repeating the CT scan of the neck but they would like to hold on that for now.  If they do change their opinion we can always order that.  Patient will come back and see me again in 4 to 6 weeks and if worsening I would recommend a CT scan of the neck at that time

## 2020-07-22 NOTE — Assessment & Plan Note (Addendum)
Patient does have degenerative disc disease of the lumbar spine.  As well as likely deconditioning we will get x-rays.  Concerned patient is having some spinal stenosis.  Patient is unable to tolerate the gabapentin secondary to confusion and patient does have what appears to be dementia at baseline patient has moved into friends home and will start with formal physical therapy there as well as seeing a new provider.  We discussed certain medications such as Cymbalta that could be beneficial for her anxiety and pain but would like them to ask their new primary care provider before we start this.  Discussed icing regimen.  Will see patient also with physical therapy for balance and coordination.

## 2020-07-22 NOTE — Patient Instructions (Addendum)
Xrays today Physical Therapy When you meet the new doc-Ask about Cymbalta See me in 4-6 weeks

## 2020-07-31 DIAGNOSIS — C44319 Basal cell carcinoma of skin of other parts of face: Secondary | ICD-10-CM | POA: Diagnosis not present

## 2020-08-14 NOTE — Progress Notes (Signed)
Cardiology Office Note   Date:  08/18/2020   ID:  Henretter, Chakrabarti [birth year], MRN [id number]  PCP:  [name], MD  Cardiologist:   [name], MD   F/U of AV disease and HTN      History of Present Illness: Brenda [name] is a 87 y.o. female with a history ofaortic stenosiss/p TAVR 11/28/18, HTN, HLD, sarcoidosis, GE reflux disease with Barrett's esophagus, chronic  cervical spine issue  and osteoarthritis   She underwentsuccessful TAVR with a55mm Edwards Sapien 3 THV via the TF approach on 11/28/2018. Post operativeecho showed normally functioning TAVR with mean gradient of 8 mm Hg and no PVL. She was started on norvasc 5 mg on follow up for elevated blood pressure.  I saw the pt in Sept 2020  She was seen by K Thompston in Spring 2021  Patient just recovering from skin (Mohs surgery)  Her breathing is stable  She deneis CP   No dizziness    Does not want COVID vaccine ahs she has recovered from [place]  Has natural immunity   She also has rheum abnormalities     Current Meds  Medication Sig  . acetaminophen (TYLENOL) 650 MG CR tablet Take 650 mg by mouth daily.   Marland Kitchen ALPRAZolam (XANAX) 0.5 MG tablet Take 0.5 mg by mouth See admin instructions. Take 0.5 mg in the morning and 1 mg at night  . amoxicillin (AMOXIL) 500 MG tablet Take 4 capsules (2,000 mg) one hour prior to all dental visits.  Marland Kitchen aspirin 81 MG chewable tablet Chew 1 tablet (81 mg total) by mouth daily.  Marland Kitchen Bioflavonoid Products (VITAMIN C PLUS) 1000 MG TABS Take by mouth.  . Biotin 5000 MCG TABS Take 5,000 mcg by mouth at bedtime.   . Cholecalciferol (VITAMIN D) 125 MCG (5000 UT) CAPS Take 5,000 Units by mouth at bedtime.   . cycloSPORINE (RESTASIS) 0.05 % ophthalmic emulsion Place 1 drop into both eyes every 12 (twelve) hours.  Marland Kitchen levothyroxine (SYNTHROID) 75 MCG tablet Take 1 tablet (75 mcg total) by mouth daily.  . pravastatin (PRAVACHOL) 10 MG tablet TAKE 1 TABLET(10 MG) BY MOUTH EVERY EVENING  .  senna-docusate (SENOKOT-S) 8.6-50 MG tablet Take 3 tablets by mouth at bedtime.   . vitamin B-12 (CYANOCOBALAMIN) 1000 MCG tablet Take 1,000 mcg by mouth at bedtime.  . Zinc 50 MG CAPS Take by mouth.     Allergies:   Patient has no known allergies.   Past Medical History:  Diagnosis Date  . Anxiety   . Asthma   . Barrett's esophagus   . Cancer ([place])   . DJD (degenerative joint disease)   . GERD (gastroesophageal reflux disease)   . Goiter   . Hemorrhoids   . History of colonoscopy   . History of shingles    face/eye  . History of subdural hematoma   . History of traumatic subdural hematoma   . Hx of colonic polyps   . Hyperlipidemia   . Hypertension   . IBS (irritable bowel syndrome)   . Osteoarthritis   . S/P TAVR (transcatheter aortic valve replacement)    23 mm Edwards Sapien 3 transcatheter heart valve placed via percutaneous right transfemoral approach   . Sarcoidosis   . [facility])     Past Surgical History:  Procedure Laterality Date  . BREAST LUMPECTOMY  1974   left  . CATARACT EXTRACTION W/ INTRAOCULAR LENS  IMPLANT, BILATERAL    . CRANIOTOMY Right 01/04/2014  Procedure: CRANIOTOMY HEMATOMA EVACUATION SUBDURAL;  Surgeon: [name], MD;  Location: [place] [facility]URO ORS;  Service: Neurosurgery;  Laterality: Right;  . CRANIOTOMY N/A 01/08/2014   Procedure: Redo CRANIOTOMY HEMATOMA EVACUATION SUBDURAL RIGHT;  Surgeon: [name], MD;  Location: [place] [facility];  Service: Neurosurgery;  Laterality: N/A;  Redo CRANIOTOMY HEMATOMA EVACUATION SUBDURAL RIGHT  . CRANIOTOMY Right 01/10/2014   Procedure: Redo CRANIOTOMY HEMATOMA EVACUATION SUBDURAL;  Surgeon: [name], MD;  Location: [place] NEUR[facility] ORS;  Service: Neurosurgery;  Laterality: Right;  . DILATION AND CURETTAGE OF UTERUS    . EYE SURGERY    . NASAL SINUS SURGERY    . NECK SURGERY     x 2  . RIGHT/LEFT HEART CATH AND CORONARY ANGIOGRAPHY N/A 09/27/2018   Procedure: RIGHT/LEFT HEART CATH AND CORONARY ANGIOGRAPHY;   Surgeon: Burnell Blanks, MD;  Location: [place] CV LAB;  Service: Cardiovascular;  Laterality: N/A;  . TEE WITHOUT CARDIOVERSION N/A 11/28/2018   Procedure: TRANSESOPHAGEAL ECHOCARDIOGRAM (TEE);  Surgeon: Sherren Mocha, MD;  Location: [place];  Service: Open Heart Surgery;  Laterality: N/A;  . TONSILLECTOMY AND ADENOIDECTOMY  1976  . TOTAL THYROIDECTOMY  06-05-08  . TRANSCATHETER AORTIC VALVE REPLACEMENT, TRANSFEMORAL N/A 11/28/2018   Procedure: TRANSCATHETER AORTIC VALVE REPLACEMENT, TRANSFEMORAL;  Surgeon: Sherren Mocha, MD;  Location: [facility];  Service: Open Heart Surgery;  Laterality: N/A;     Social History:  The patient  reports that she has never smoked. She has never used smokeless tobacco. She reports current alcohol use. She reports that she does not use drugs.   Family History:  The patient's family history includes Allergies in her sister, sister, sister, and sister; Asthma in her sister; Colon cancer in her sister; Heart disease in her father; Heart failure in her father; Hyperlipidemia in her father and mother; Hypertension in her brother, father, mother, and sister; Kidney cancer in her maternal grandmother and mother; Lymphoma in her mother; Rheum arthritis in her father; Thyroid cancer in her sister.    ROS:  Please see the history of present illness. All other systems are reviewed and  Negative to the above problem except as noted.    PHYSICAL EXAM: VS:  BP (!) 148/92   Pulse 80   Ht 5' 3.5" (1.613 m)   Wt 144 lb (65.3 kg)   BMI 25.11 kg/m   GEN: Well nourished, well developed, in no acute distress  HEENT: normal  Neck: no JVD,  Cardiac: RRR; Gr I-II/VI systolic murmur base   No diastolic murmurs  No  rubs, or gallops,no edema  Respiratory:  clear to auscultation bilaterally, normal work of breathing GI: soft, nontender, nondistended, + BS  No hepatomegaly  MS: no deformity Moving all extremities   Skin: warm and dry, no rash Neuro:  Strength and sensation  are intact Psych: euthymic mood, full affect   EKG:  EKG is  ordered today.  SR 80 bpme  Nonspecific ST changes    Lipid Panel    Component Value Date/Time   CHOL 241 (H) 11/21/2019 1547   TRIG 173 (H) 11/21/2019 1547   HDL 53 11/21/2019 1547   CHOLHDL 4.5 (H) 11/21/2019 1547   CHOLHDL 2.9 06/07/2019 1940   VLDL 16 06/07/2019 1940   LDLCALC 157 (H) 11/21/2019 1547      Wt Readings from Last 3 Encounters:  08/18/20 144 lb (65.3 kg)  07/22/20 132 lb (59.9 kg)  11/21/19 136 lb (61.7 kg)      ASSESSMENT  AND PLAN:  1  AV disease   Pt s/p TAVR in May 2020   Echo after shows valve is functioning well    Valve sounds unchanged  Follow    2  HTN  BP is a little high  Follow   I would not change    3   HL   Will recheck lipids     4  Dizziness  Pt deneis  Cehck lipids, CBC, BMET  F/U in the fall     Current medicines are reviewed at length with the patient today.  The patient does not have concerns regarding medicines.  Signed, [name], MD  08/18/2020 11:29 AM    [address], [place],   [address] Phone: [phone]; Fax: [phone]

## 2020-08-18 ENCOUNTER — Other Ambulatory Visit: Payer: Self-pay

## 2020-08-18 ENCOUNTER — Encounter: Payer: Self-pay | Admitting: Internal Medicine

## 2020-08-18 ENCOUNTER — Ambulatory Visit (INDEPENDENT_AMBULATORY_CARE_PROVIDER_SITE_OTHER): Payer: Medicare Other | Admitting: Internal Medicine

## 2020-08-18 VITALS — BP 148/92 | HR 80 | Ht 63.5 in | Wt 144.0 lb

## 2020-08-18 DIAGNOSIS — E782 Mixed hyperlipidemia: Secondary | ICD-10-CM | POA: Diagnosis not present

## 2020-08-18 DIAGNOSIS — I10 Essential (primary) hypertension: Secondary | ICD-10-CM

## 2020-08-18 DIAGNOSIS — I359 Nonrheumatic aortic valve disorder, unspecified: Secondary | ICD-10-CM

## 2020-08-18 LAB — CBC
Hematocrit: 42.8 % (ref 34.0–46.6)
Hemoglobin: 14.7 g/dL (ref 11.1–15.9)
MCH: 32 pg (ref 26.6–33.0)
MCHC: 34.3 g/dL (ref 31.5–35.7)
MCV: 93 fL (ref 79–97)
Platelets: 187 10*3/uL (ref 150–450)
RBC: 4.6 x10E6/uL (ref 3.77–5.28)
RDW: 12.7 % (ref 11.7–15.4)
WBC: 8.2 10*3/uL (ref 3.4–10.8)

## 2020-08-18 LAB — LIPID PANEL
Chol/HDL Ratio: 4 ratio (ref 0.0–4.4)
Cholesterol, Total: 273 mg/dL — ABNORMAL HIGH (ref 100–199)
HDL: 68 mg/dL (ref >39)
LDL Chol Calc (NIH): 165 mg/dL — ABNORMAL HIGH (ref 0–99)
Triglycerides: 221 mg/dL — ABNORMAL HIGH (ref 0–149)
VLDL Cholesterol Cal: 40 mg/dL (ref 5–40)

## 2020-08-18 LAB — BASIC METABOLIC PANEL
BUN/Creatinine Ratio: 24 (ref 12–28)
BUN: 20 mg/dL (ref 8–27)
CO2: 22 mmol/L (ref 20–29)
Calcium: 9.4 mg/dL (ref 8.7–10.3)
Chloride: 102 mmol/L (ref 96–106)
Creatinine, Ser: 0.83 mg/dL (ref 0.57–1.00)
GFR calc Af Amer: 73 mL/min/{1.73_m2} (ref >59)
GFR calc non Af Amer: 64 mL/min/{1.73_m2} (ref >59)
Glucose: 101 mg/dL — ABNORMAL HIGH (ref 65–99)
Potassium: 4.1 mmol/L (ref 3.5–5.2)
Sodium: 139 mmol/L (ref 134–144)

## 2020-08-18 NOTE — Patient Instructions (Signed)
Medication Instructions:  No changes *If you need a refill on your cardiac medications before your next appointment, please call your pharmacy*   Lab Work: Today: bmet, cbc, lipids  If you have labs (blood work) drawn today and your tests are completely normal, you will receive your results only by: Marland Kitchen MyChart Message (if you have MyChart) OR . A paper copy in the mail If you have any lab test that is abnormal or we need to change your treatment, we will call you to review the results.   Testing/Procedures: none   Follow-Up: At [facility], you and your health needs are our priority.  As part of our continuing mission to provide you with exceptional heart care, we have created designated Provider Care Teams.  These Care Teams include your primary Cardiologist (physician) and Advanced Practice Providers (APPs -  Physician Assistants and Nurse Practitioners) who all work together to provide you with the care you need, when you need it.   Your next appointment:   8 month(s) September 2022  The format for your next appointment:   In Person  Provider:   You may see [name], MD or one of the following Advanced Practice Providers on your designated Care Team:    Richardson Dopp, PA-C  Robbie Lis, [place]    Other Instructions

## 2020-08-20 ENCOUNTER — Encounter: Payer: Self-pay | Admitting: Internal Medicine

## 2020-08-26 ENCOUNTER — Ambulatory Visit: Payer: Medicare Other | Admitting: Family Medicine

## 2020-08-28 ENCOUNTER — Telehealth: Payer: Self-pay | Admitting: Internal Medicine

## 2020-08-28 NOTE — Telephone Encounter (Signed)
Patient's daughter is returning call to discuss lab results. 

## 2020-08-29 NOTE — Telephone Encounter (Signed)
Left message that pt's daughter can call back for results or view recommendations in MyChart.  I will send there as well.

## 2020-08-29 NOTE — Telephone Encounter (Signed)
-----   Message from Fay Records, MD sent at 08/18/2020  9:21 PM EST ----- Electrolytes and kidney function are normal  CBC is normal Lipids:   Triglycerides up at 221   Watch carbs HDL good at 68    LDL 165   Would recomm stopping pravastatin and starting crestor 10 mg daily   F/U lipid panel in 8 wks

## 2020-09-09 NOTE — Telephone Encounter (Signed)
Fay Records, MD  08/18/2020 9:21 PM EST      Electrolytes and kidney function are normal  CBC is normal Lipids:  Triglycerides up at 221  Watch carbs HDL good at 68   LDL 165  Would recomm stopping pravastatin and starting crestor 10 mg daily  F/U lipid panel in 8 wks    _______________________________________________ Pt's daughter sent message with questions  re: the recent lab recommendations above.    Wants to try healthier diet and have checked w again with Dr. Cruzita Lederer in March instead of changing meds at this time.   I replied to message.  Will send to Dr. Harrington Challenger as Juluis Rainier.

## 2020-09-17 ENCOUNTER — Encounter: Payer: Self-pay | Admitting: Internal Medicine

## 2020-09-18 DIAGNOSIS — N183 Chronic kidney disease, stage 3 unspecified: Secondary | ICD-10-CM | POA: Diagnosis not present

## 2020-09-18 DIAGNOSIS — D860 Sarcoidosis of lung: Secondary | ICD-10-CM | POA: Diagnosis not present

## 2020-09-18 DIAGNOSIS — I129 Hypertensive chronic kidney disease with stage 1 through stage 4 chronic kidney disease, or unspecified chronic kidney disease: Secondary | ICD-10-CM | POA: Diagnosis not present

## 2020-09-18 DIAGNOSIS — Z952 Presence of prosthetic heart valve: Secondary | ICD-10-CM | POA: Diagnosis not present

## 2020-09-18 DIAGNOSIS — N1831 Chronic kidney disease, stage 3a: Secondary | ICD-10-CM | POA: Diagnosis not present

## 2020-09-25 ENCOUNTER — Ambulatory Visit (INDEPENDENT_AMBULATORY_CARE_PROVIDER_SITE_OTHER): Payer: Medicare Other | Admitting: Internal Medicine

## 2020-09-25 ENCOUNTER — Other Ambulatory Visit: Payer: Self-pay

## 2020-09-25 ENCOUNTER — Encounter: Payer: Self-pay | Admitting: Internal Medicine

## 2020-09-25 VITALS — BP 118/80 | HR 68 | Ht 63.5 in | Wt 144.2 lb

## 2020-09-25 DIAGNOSIS — E7800 Pure hypercholesterolemia, unspecified: Secondary | ICD-10-CM | POA: Diagnosis not present

## 2020-09-25 DIAGNOSIS — E890 Postprocedural hypothyroidism: Secondary | ICD-10-CM | POA: Diagnosis not present

## 2020-09-25 LAB — LIPID PANEL
Cholesterol: 210 mg/dL — ABNORMAL HIGH (ref 0–200)
HDL: 58.8 mg/dL (ref >39.00)
NonHDL: 151.21
Total CHOL/HDL Ratio: 4
Triglycerides: 256 mg/dL — ABNORMAL HIGH (ref 0.0–149.0)
VLDL: 51.2 mg/dL — ABNORMAL HIGH (ref 0.0–40.0)

## 2020-09-25 LAB — LDL CHOLESTEROL, DIRECT: Direct LDL: 124 mg/dL

## 2020-09-25 LAB — T4, FREE: Free T4: 0.94 ng/dL (ref 0.60–1.60)

## 2020-09-25 LAB — TSH: TSH: 2.75 u[IU]/mL (ref 0.35–4.50)

## 2020-09-25 NOTE — Patient Instructions (Signed)
Please continue Levothyroxine 75 mcg daily.  Take the thyroid hormone every day, with water, at least 30 minutes before breakfast, separated by at least 4 hours from: - acid reflux medications - calcium - iron - multivitamins  Please stop at the lab.  Please come back for a follow-up appointment in 1 year. 
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
## 2020-10-02 ENCOUNTER — Other Ambulatory Visit: Payer: Self-pay | Admitting: Internal Medicine

## 2020-10-29 ENCOUNTER — Encounter: Payer: Self-pay | Admitting: Internal Medicine

## 2020-10-29 ENCOUNTER — Non-Acute Institutional Stay: Payer: Medicare Other | Admitting: Internal Medicine

## 2020-10-29 ENCOUNTER — Other Ambulatory Visit: Payer: Self-pay

## 2020-10-29 VITALS — BP 146/80 | HR 59 | Temp 97.6°F | Ht 63.5 in | Wt 145.9 lb

## 2020-10-29 DIAGNOSIS — E785 Hyperlipidemia, unspecified: Secondary | ICD-10-CM | POA: Diagnosis not present

## 2020-10-29 DIAGNOSIS — Z952 Presence of prosthetic heart valve: Secondary | ICD-10-CM | POA: Diagnosis not present

## 2020-10-29 DIAGNOSIS — F419 Anxiety disorder, unspecified: Secondary | ICD-10-CM

## 2020-10-29 DIAGNOSIS — M5136 Other intervertebral disc degeneration, lumbar region: Secondary | ICD-10-CM

## 2020-10-29 DIAGNOSIS — M5030 Other cervical disc degeneration, unspecified cervical region: Secondary | ICD-10-CM

## 2020-10-29 DIAGNOSIS — K2100 Gastro-esophageal reflux disease with esophagitis, without bleeding: Secondary | ICD-10-CM

## 2020-10-29 DIAGNOSIS — F32A Depression, unspecified: Secondary | ICD-10-CM

## 2020-10-29 MED ORDER — DULOXETINE HCL 20 MG PO CPEP: 20.0000 mg | ORAL_CAPSULE | Freq: Every day | ORAL | 3 refills | Status: DC

## 2020-10-29 MED ORDER — ROSUVASTATIN CALCIUM 5 MG PO TABS: 5.0000 mg | ORAL_TABLET | Freq: Every day | ORAL | 2 refills | Status: DC

## 2020-10-30 ENCOUNTER — Encounter: Payer: Self-pay | Admitting: Internal Medicine

## 2020-10-30 NOTE — Progress Notes (Signed)
Location:  [place] of Service:  Clinic (12)  Provider:   Code Status: Goals of Care:  Advanced Directives 06/07/2019  Does Patient Have a Medical Advance Directive? No  Type of Advance Directive -  Does patient want to make changes to medical advance directive? -  Copy of [place] in Chart? -  Would patient like information on creating a medical advance directive? No - Patient declined     Chief Complaint  Patient presents with  . Medical Management of Chronic Issues    Patient here today to establish care.     HPI: Patient is a 87 y.o. female seen today for medical management of chronic diseases.    Patient came to establish care today. She has recently moved and friends home.  Per patient she lost her husband in June last year and her daughters moved her to the community.  Her husband had new care at disease and she was the main caregiver Patient active problem Has pain in her neck and lower back due to degenerative disease. She has seen Dr. Ortencia Kick.  Has not tolerated gabapentin in the past. He suggested physical therapy and possible Cymbalta for pain management. CT scan if symptoms persist.  Cannot do MRI due to Anterior plate  in her neck Depression with anxiety Due to loss of her husband.  Moved to a new place. She says she is doing much better now.  Daughter who came with her was worried that she still needs Xanax which she has been on for  many years. HLD Her cardiology Dr. Harrington Challenger changed Pravachol  to Crestor.  Patient has not made the change yet because she was worried about muscle pain.  Also has history of TAVR in 11/2018 GE reflux disease with Barrett's Hypertension Skin cancer s/p Mohs surgery  She is very active.  Was playing tennis few years ago. Very supportive daughters.   Past Medical History:  Diagnosis Date  . Anxiety   . Asthma   . Barrett's esophagus   . Cancer ([place])   . DJD (degenerative joint  disease)   . GERD (gastroesophageal reflux disease)   . Goiter   . Hemorrhoids   . History of colonoscopy   . History of shingles    face/eye  . History of subdural hematoma   . History of traumatic subdural hematoma   . Hx of colonic polyps   . Hyperlipidemia   . Hypertension   . IBS (irritable bowel syndrome)   . Osteoarthritis   . S/P TAVR (transcatheter aortic valve replacement)    23 mm Edwards Sapien 3 transcatheter heart valve placed via percutaneous right transfemoral approach   . Sarcoidosis   . [facility])     Past Surgical History:  Procedure Laterality Date  . BREAST LUMPECTOMY  1974   left  . CATARACT EXTRACTION W/ INTRAOCULAR LENS  IMPLANT, BILATERAL    . CRANIOTOMY Right 01/04/2014   Procedure: CRANIOTOMY HEMATOMA EVACUATION SUBDURAL;  Surgeon: [name], MD;  Location: [place] [facility];  Service: Neurosurgery;  Laterality: Right;  . CRANIOTOMY N/A 01/08/2014   Procedure: Redo CRANIOTOMY HEMATOMA EVACUATION SUBDURAL RIGHT;  Surgeon: [name], MD;  Location: [place] NEUR[facility] ORS;  Service: Neurosurgery;  Laterality: N/A;  Redo CRANIOTOMY HEMATOMA EVACUATION SUBDURAL RIGHT  . CRANIOTOMY Right 01/10/2014   Procedure: Redo CRANIOTOMY HEMATOMA EVACUATION SUBDURAL;  Surgeon: [name], MD;  Location: [place] NEUR[facility]S;  Service: Neurosurgery;  Laterality:  Right;  Marland Kitchen DILATION AND CURETTAGE OF UTERUS    . EYE SURGERY    . NASAL SINUS SURGERY    . NECK SURGERY     x 2  . RIGHT/LEFT HEART CATH AND CORONARY ANGIOGRAPHY N/A 09/27/2018   Procedure: RIGHT/LEFT HEART CATH AND CORONARY ANGIOGRAPHY;  Surgeon: Burnell Blanks, MD;  Location: [place] CV LAB;  Service: Cardiovascular;  Laterality: N/A;  . TEE WITHOUT CARDIOVERSION N/A 11/28/2018   Procedure: TRANSESOPHAGEAL ECHOCARDIOGRAM (TEE);  Surgeon: Sherren Mocha, MD;  Location: [place];  Service: Open Heart Surgery;  Laterality: N/A;  . TONSILLECTOMY AND ADENOIDECTOMY  1976  . TOTAL THYROIDECTOMY  06-05-08  .  TRANSCATHETER AORTIC VALVE REPLACEMENT, TRANSFEMORAL N/A 11/28/2018   Procedure: TRANSCATHETER AORTIC VALVE REPLACEMENT, TRANSFEMORAL;  Surgeon: Sherren Mocha, MD;  Location: [place];  Service: Open Heart Surgery;  Laterality: N/A;    Allergies  Allergen Reactions  . Losartan Potassium Other (See Comments)  . Zoster Vaccine Live Other (See Comments)    Outpatient Encounter Medications as of 10/29/2020  Medication Sig  . acetaminophen (TYLENOL) 650 MG CR tablet Take 650 mg by mouth daily.   Marland Kitchen ALPRAZolam (XANAX) 0.5 MG tablet Take 0.5 mg by mouth See admin instructions. Take 0.5 mg in the morning and 1 mg at night  . amoxicillin (AMOXIL) 500 MG tablet Take 4 capsules (2,000 mg) one hour prior to all dental visits.  Marland Kitchen aspirin 81 MG chewable tablet Chew 1 tablet (81 mg total) by mouth daily.  Marland Kitchen Bioflavonoid Products (VITAMIN C PLUS) 1000 MG TABS Take by mouth.  . carvedilol (COREG) 3.125 MG tablet Take 3.125 mg by mouth 2 (two) times daily.  . Cholecalciferol (VITAMIN D) 125 MCG (5000 UT) CAPS Take 5,000 Units by mouth at bedtime.   . cycloSPORINE (RESTASIS) 0.05 % ophthalmic emulsion Place 1 drop into both eyes every 12 (twelve) hours.  . DULoxetine (CYMBALTA) 20 MG capsule Take 1 capsule (20 mg total) by mouth daily.  Marland Kitchen levothyroxine (SYNTHROID) 75 MCG tablet TAKE 1 TABLET(75 MCG) BY MOUTH DAILY  . rosuvastatin (CRESTOR) 5 MG tablet Take 1 tablet (5 mg total) by mouth daily.  Marland Kitchen senna-docusate (SENOKOT-S) 8.6-50 MG tablet Take 3 tablets by mouth at bedtime.   . [DISCONTINUED] pravastatin (PRAVACHOL) 10 MG tablet TAKE 1 TABLET(10 MG) BY MOUTH EVERY EVENING  . vitamin B-12 (CYANOCOBALAMIN) 1000 MCG tablet Take 1,000 mcg by mouth at bedtime. (Patient not taking: Reported on 10/29/2020)  . Zinc 50 MG CAPS Take by mouth. (Patient not taking: Reported on 10/29/2020)   No facility-administered encounter medications on file as of 10/29/2020.    Review of Systems:  Review of Systems  Constitutional:  Positive for activity change.  HENT: Negative.   Respiratory: Negative.   Cardiovascular: Negative.   Gastrointestinal: Negative.   Genitourinary: Negative.   Musculoskeletal: Positive for arthralgias, myalgias, neck pain and neck stiffness.  Skin: Negative.   Neurological: Negative for dizziness.  Psychiatric/Behavioral: Positive for dysphoric mood and sleep disturbance. The patient is nervous/anxious.     Health Maintenance  Topic Date Due  . COVID-19 Vaccine (1) Never done  . DEXA SCAN  Never done  . INFLUENZA VACCINE  02/23/2021  . TETANUS/TDAP  12/19/2023  . PNA vac Low Risk Adult  Completed  . HPV VACCINES  Aged Out    Physical Exam: Vitals:   10/29/20 1543  Temp: 97.6 F (36.4 C)  SpO2: 96%  Weight: 145 lb 14.4 oz (66.2 kg)  Height: 5' 3.5" (1.613  m)   Body mass index is 25.44 kg/m. Physical Exam  Constitutional: Oriented to person, place, and time. Well-developed and well-nourished.  HENT:  Head: Normocephalic.  Mouth/Throat: Oropharynx is clear and moist.  Eyes: Pupils are equal, round, and reactive to light.  Neck: Neck supple. Some stiffness but good range of Motion Cardiovascular: Normal rate and normal heart sounds.  Soft murmur heard. Pulmonary/Chest: Effort normal and breath sounds normal. No respiratory distress. No wheezes. She has no rales.  Abdominal: Soft. Bowel sounds are normal. No distension. There is no tenderness. There is no rebound.  Musculoskeletal: No edema.  Lymphadenopathy: none Neurological: Alert and oriented to person, place, and time.  Gait stable Skin: Skin is warm and dry.  Psychiatric: Normal mood and affect. Behavior is normal. Thought content normal.   Labs reviewed: Basic Metabolic Panel: Recent Labs    11/21/19 1547 08/18/20 1153 09/25/20 1541  NA 140 139  --   K 3.8 4.1  --   CL 103 102  --   CO2 24 22  --   GLUCOSE 90 101*  --   BUN 23 20  --   CREATININE 0.83 0.83  --   CALCIUM 9.2 9.4  --   TSH 0.619  --   2.75   Liver Function Tests: No results for input(s): AST, ALT, ALKPHOS, BILITOT, PROT, ALBUMIN in the last 8760 hours. No results for input(s): LIPASE, AMYLASE in the last 8760 hours. No results for input(s): AMMONIA in the last 8760 hours. CBC: Recent Labs    11/21/19 1547 08/18/20 1153  WBC 6.7 8.2  NEUTROABS 4.1  --   HGB 14.5 14.7  HCT 42.1 42.8  MCV 89 93  PLT 175 187   Lipid Panel: Recent Labs    11/21/19 1547 08/18/20 1153 09/25/20 1541  CHOL 241* 273* 210*  HDL 53 68 58.80  LDLCALC 157* 165*  --   TRIG 173* 221* 256.0*  CHOLHDL 4.5* 4.0 4  LDLDIRECT  --   --  124.0   Lab Results  Component Value Date   HGBA1C 5.4 11/23/2018    Procedures since last visit: No results found.  Assessment/Plan 1. Degenerative disc disease, cervical Has failed Neurontin. If no Improvement plan for CT scan  - Ambulatory referral to Physical Therapy  2. Degenerative disc disease, lumbar  - Ambulatory referral to Physical Therapy  3. Hyperlipidemia, unspecified hyperlipidemia type Discontinue Pravachol Start on Crestor. If hs e has cramps will go back to Pravachol 4. Anxiety and depression Continue Xanax. Will call for refill when due to our office Wants prescription to [place] Also start Cymbalta to help with mood and Pain  - BASIC METABOLIC PANEL WITH GFR; Future  5. S/P TAVR (transcatheter aortic valve replacement) Follows with Cardiology Dental Prophlaxis  7. Essential Hypertension On Low dose Coreg started recently 8 Hypothyroidism Follows with Dr Cruzita Lederer  9 Skin Cancer  Recovered well from [facility] suregery    Labs/tests ordered:  * No order type specified * Next appt:  11/20/2020

## 2020-11-11 ENCOUNTER — Encounter: Payer: Self-pay | Admitting: Internal Medicine

## 2020-11-18 ENCOUNTER — Other Ambulatory Visit: Payer: Self-pay | Admitting: Internal Medicine

## 2020-11-18 MED ORDER — ALPRAZOLAM 0.5 MG PO TABS: 0.5000 mg | ORAL_TABLET | ORAL | 0 refills | Status: DC

## 2020-11-18 NOTE — Telephone Encounter (Signed)
Statin removed from medication list as patient is not taking due to intolerance. RX for alprazolam pended for provider to review along with mychart message

## 2020-11-18 NOTE — Telephone Encounter (Signed)
Incoming call received from patient stating her daughter requested refill from [facility] in error and rx should go to [facility]. Mrs.Grassi apologized for this mix up

## 2020-12-30 DIAGNOSIS — H43813 Vitreous degeneration, bilateral: Secondary | ICD-10-CM | POA: Diagnosis not present

## 2020-12-30 DIAGNOSIS — H3540 Unspecified peripheral retinal degeneration: Secondary | ICD-10-CM | POA: Diagnosis not present

## 2020-12-30 DIAGNOSIS — H35373 Puckering of macula, bilateral: Secondary | ICD-10-CM | POA: Diagnosis not present

## 2020-12-30 DIAGNOSIS — H35363 Drusen (degenerative) of macula, bilateral: Secondary | ICD-10-CM | POA: Diagnosis not present

## 2021-01-15 ENCOUNTER — Other Ambulatory Visit: Payer: Self-pay | Admitting: Nurse Practitioner

## 2021-01-15 NOTE — Telephone Encounter (Signed)
Requested medication is not on active medication list, I will send to [facility] Providers to review

## 2021-02-11 ENCOUNTER — Non-Acute Institutional Stay: Payer: Medicare Other | Admitting: Internal Medicine

## 2021-02-11 ENCOUNTER — Encounter: Payer: Self-pay | Admitting: Internal Medicine

## 2021-02-11 ENCOUNTER — Other Ambulatory Visit: Payer: Self-pay

## 2021-02-11 VITALS — BP 162/88 | HR 58 | Temp 96.6°F | Ht 63.5 in | Wt 148.4 lb

## 2021-02-11 DIAGNOSIS — E785 Hyperlipidemia, unspecified: Secondary | ICD-10-CM

## 2021-02-11 DIAGNOSIS — F32A Depression, unspecified: Secondary | ICD-10-CM

## 2021-02-11 DIAGNOSIS — F419 Anxiety disorder, unspecified: Secondary | ICD-10-CM | POA: Diagnosis not present

## 2021-02-11 DIAGNOSIS — R197 Diarrhea, unspecified: Secondary | ICD-10-CM

## 2021-02-11 DIAGNOSIS — Z952 Presence of prosthetic heart valve: Secondary | ICD-10-CM | POA: Diagnosis not present

## 2021-02-11 DIAGNOSIS — I10 Essential (primary) hypertension: Secondary | ICD-10-CM

## 2021-02-11 DIAGNOSIS — M5030 Other cervical disc degeneration, unspecified cervical region: Secondary | ICD-10-CM | POA: Diagnosis not present

## 2021-02-11 MED ORDER — CARVEDILOL 6.25 MG PO TABS: 6.2500 mg | ORAL_TABLET | Freq: Two times a day (BID) | ORAL | 3 refills | Status: DC

## 2021-02-11 MED ORDER — ALPRAZOLAM 0.5 MG PO TABS: 1.0000 mg | ORAL_TABLET | Freq: Every day | ORAL | 0 refills | Status: DC

## 2021-02-11 NOTE — Patient Instructions (Addendum)
Increasing your BP pill. Will call new Prescription Start Taking Rosuvastatin for Cholesterol Making Order for Therapy Stop taking Xanax in the morning. Continue 2 tabs in the evening Don't take Cymbalta Check BP Q weekly in clinic Tuesday 10.30 to 12pm

## 2021-02-15 MED ORDER — ROSUVASTATIN CALCIUM 5 MG PO TABS: 5.0000 mg | ORAL_TABLET | Freq: Every day | ORAL | 3 refills | Status: DC

## 2021-02-15 NOTE — Progress Notes (Signed)
Location:  [place] of Service:  Clinic (12)  Provider:   Code Status:  Goals of Care:  Advanced Directives 02/11/2021  Does Patient Have a Medical Advance Directive? Yes  Type of Advance Directive Living will;Healthcare Power of Attorney  Does patient want to make changes to medical advance directive? No - Patient declined  Copy of [place] in Chart? Yes - validated most recent copy scanned in chart (See row information)  Would patient like information on creating a medical advance directive? -     Chief Complaint  Patient presents with   Medical Management of Chronic Issues    Patient returns to the clinic for follow up.    Health Maintenance    Dexa. Patient has had shingles virus    HPI: Patient is a 87 y.o. female seen today for medical management of chronic diseases.    She has recently moved and friends home.  Per patient she lost her husband in June last year and her daughters moved her to the community   Has pain in her neck and lower back due to degenerative disease. She has seen Dr. Ortencia Kick.  Has not tolerated gabapentin in the past. He suggested physical therapy and possible Cymbalta for pain management. CT scan if symptoms persist.  Cannot do MRI due to Anterior plate  in her neck  Never started Cymbalta and did not get therapy Wants to know if I can make referral Again  Depression with anxiety Due to loss of her husband.  Moved to a new place. She says she is doing much better now Trying to taper Xanax and wants to know if she can reduce the morning dose HLD Her cardiology Dr. Harrington Challenger changed Pravachol  to Crestor She still has not started that due to fear of Cramps Diarrhea Is now resolved per daughter had it for long time but patients says it is due to Food in Friends home She was also taking Senna and has reduced the dose now  Also has history of TAVR in 11/2018 GE reflux disease with  Barrett's Hypertension Skin cancer s/p Mohs surgery  Past Medical History:  Diagnosis Date   Anxiety    Asthma    Barrett's esophagus    Cancer ([place])    DJD (degenerative joint disease)    GERD (gastroesophageal reflux disease)    Goiter    Hemorrhoids    History of colonoscopy    History of shingles    face/eye   History of subdural hematoma    History of traumatic subdural hematoma    Hx of colonic polyps    Hyperlipidemia    Hypertension    IBS (irritable bowel syndrome)    Osteoarthritis    S/P TAVR (transcatheter aortic valve replacement)    23 mm Edwards Sapien 3 transcatheter heart valve placed via percutaneous right transfemoral approach    Sarcoidosis    [facility] ([place])     Past Surgical History:  Procedure Laterality Date   BREAST LUMPECTOMY  1974   left   CATARACT EXTRACTION W/ INTRAOCULAR LENS  IMPLANT, BILATERAL     CRANIOTOMY Right 01/04/2014   Procedure: CRANIOTOMY HEMATOMA EVACUATION SUBDURAL;  Surgeon: [name], MD;  Location: [facility];  Service: Neurosurgery;  Laterality: Right;   CRANIOTOMY N/A 01/08/2014   Procedure: Redo CRANIOTOMY HEMATOMA EVACUATION SUBDURAL RIGHT;  Surgeon: [name], MD;  Location: [place] NEURO ORS;  Service: Neurosurgery;  Laterality: N/A;  Redo CRANIOTOMY HEMATOMA EVACUATION SUBDURAL RIGHT   CRANIOTOMY Right 01/10/2014   Procedure: Redo CRANIOTOMY HEMATOMA EVACUATION SUBDURAL;  Surgeon: [name], MD;  Location: [place] NEUR[facility] ORS;  Service: Neurosurgery;  Laterality: Right;   DILATION AND CURETTAGE OF UTERUS     EYE SURGERY     NASAL SINUS SURGERY     NECK SURGERY     x 2   RIGHT/LEFT HEART CATH AND CORONARY ANGIOGRAPHY N/A 09/27/2018   Procedure: RIGHT/LEFT HEART CATH AND CORONARY ANGIOGRAPHY;  Surgeon: Burnell Blanks, MD;  Location: [place] CV LAB;  Service: Cardiovascular;  Laterality: N/A;   TEE WITHOUT CARDIOVERSION N/A 11/28/2018   Procedure: TRANSESOPHAGEAL ECHOCARDIOGRAM (TEE);  Surgeon: Sherren Mocha, MD;  Location: [place];  Service: Open Heart Surgery;  Laterality: N/A;   TONSILLECTOMY AND ADENOIDECTOMY  1976   TOTAL THYROIDECTOMY  06-05-08   TRANSCATHETER AORTIC VALVE REPLACEMENT, TRANSFEMORAL N/A 11/28/2018   Procedure: TRANSCATHETER AORTIC VALVE REPLACEMENT, TRANSFEMORAL;  Surgeon: Sherren Mocha, MD;  Location: [place];  Service: Open Heart Surgery;  Laterality: N/A;    Allergies  Allergen Reactions   Losartan Potassium Other (See Comments)   Zoster Vaccine Live Other (See Comments)    Outpatient Encounter Medications as of 02/11/2021  Medication Sig   acetaminophen (TYLENOL) 650 MG CR tablet Take 650 mg by mouth daily.    ALPRAZolam (XANAX) 0.5 MG tablet Take 2 tablets (1 mg total) by mouth at bedtime.   amoxicillin (AMOXIL) 500 MG tablet Take 4 capsules (2,000 mg) one hour prior to all dental visits.   aspirin 81 MG chewable tablet Chew 1 tablet (81 mg total) by mouth daily.   Bacillus Coagulans-Inulin (PROBIOTIC-PREBIOTIC PO) Take 1 capsule by mouth daily at 6 (six) AM.   carvedilol (COREG) 6.25 MG tablet Take 1 tablet (6.25 mg total) by mouth 2 (two) times daily with a meal.   Cholecalciferol (VITAMIN D) 50 MCG (2000 UT) tablet Take 2,000 Units by mouth at bedtime.   cycloSPORINE (RESTASIS) 0.05 % ophthalmic emulsion Place 1 drop into both eyes every 12 (twelve) hours.   levothyroxine (SYNTHROID) 75 MCG tablet TAKE 1 TABLET(75 MCG) BY MOUTH DAILY   Melatonin 10 MG CAPS Take 1 capsule by mouth at bedtime.   Misc Natural Products (ELDERBERRY ZINC/VIT C/IMMUNE MT) Use as directed 2 capsules in the mouth or throat daily at 6 (six) AM.   Multiple Vitamins-Minerals (B COMPLEX PLUS VITAMIN C PO) Take 1 capsule by mouth daily at 6 (six) AM.   senna-docusate (SENOKOT-S) 8.6-50 MG tablet Take 3 tablets by mouth at bedtime.    vitamin E 180 MG (400 UNITS) capsule Take 400 Units by mouth daily.   Zinc 50 MG CAPS Take by mouth.   [DISCONTINUED] ALPRAZolam (XANAX) 0.5 MG tablet TAKE 1  TABLET BY MOUTH EVERY MORNING AND 2 TABLETS EVERY NIGHT AT BEDTIME (Patient taking differently: 1 mg at bedtime.)   Turmeric (QC TUMERIC COMPLEX PO) Take by mouth daily. 2 gummies   [DISCONTINUED] Bioflavonoid Products (VITAMIN C PLUS) 1000 MG TABS Take by mouth. (Patient not taking: Reported on 02/11/2021)   [DISCONTINUED] carvedilol (COREG) 3.125 MG tablet Take 3.125 mg by mouth 2 (two) times daily.   [DISCONTINUED] Coenzyme Q10 (CO Q 10) 100 MG CAPS Take 2 capsules by mouth daily. (Patient not taking: No sig reported)   [DISCONTINUED] DULoxetine (CYMBALTA) 20 MG capsule Take 1 capsule (20 mg total) by mouth daily.   [DISCONTINUED] ketotifen (ZADITOR) 0.025 % ophthalmic solution 1 drop 2 (two)  times daily. (Patient not taking: Reported on 02/11/2021)   No facility-administered encounter medications on file as of 02/11/2021.    Review of Systems:  Review of Systems  Constitutional: Negative.   HENT: Negative.    Respiratory: Negative.    Cardiovascular: Negative.   Gastrointestinal:  Positive for abdominal distention and diarrhea.  Genitourinary: Negative.   Musculoskeletal:  Positive for arthralgias, back pain and myalgias.  Skin: Negative.   Neurological:  Negative for dizziness.  Psychiatric/Behavioral:  Positive for dysphoric mood and sleep disturbance. The patient is nervous/anxious.   All other systems reviewed and are negative.  Health Maintenance  Topic Date Due   COVID-19 Vaccine (1) Never done   Zoster Vaccines- Shingrix (1 of 2) Never done   DEXA SCAN  Never done   INFLUENZA VACCINE  02/23/2021   TETANUS/TDAP  12/19/2023   PNA vac Low Risk Adult  Completed   HPV VACCINES  Aged Out    Physical Exam: Vitals:   02/11/21 1409  BP: (!) 162/88  Pulse: (!) 58  Temp: (!) 96.6 F (35.9 C)  SpO2: 96%  Weight: 148 lb 6.4 oz (67.3 kg)  Height: 5' 3.5" (1.613 m)   Body mass index is 25.88 kg/m. Physical Exam Constitutional: Oriented to person, place, and time.  Well-developed and well-nourished.  HENT:  Head: Normocephalic.  Mouth/Throat: Oropharynx is clear and moist.  Eyes: Pupils are equal, round, and reactive to light.  Neck: Neck supple.  Some stiffness but good range of Motion Cardiovascular: Normal rate and normal heart sounds.  Soft murmur heard. Pulmonary/Chest: Effort normal and breath sounds normal. No respiratory distress. No wheezes. She has no rales.  Abdominal: Soft. Bowel sounds are normal. No distension. There is no tenderness. There is no rebound.  Musculoskeletal: No edema.  Lymphadenopathy: none Neurological: Alert and oriented to person, place, and time. Gait stable Skin: Skin is warm and dry.  Psychiatric: Normal mood and affect. Behavior is normal. Thought content normal.   Labs reviewed: Basic Metabolic Panel: Recent Labs    08/18/20 1153 09/25/20 1541  NA 139  --   K 4.1  --   CL 102  --   CO2 22  --   GLUCOSE 101*  --   BUN 20  --   CREATININE 0.83  --   CALCIUM 9.4  --   TSH  --  2.75   Liver Function Tests: No results for input(s): AST, ALT, ALKPHOS, BILITOT, PROT, ALBUMIN in the last 8760 hours. No results for input(s): LIPASE, AMYLASE in the last 8760 hours. No results for input(s): AMMONIA in the last 8760 hours. CBC: Recent Labs    08/18/20 1153  WBC 8.2  HGB 14.7  HCT 42.8  MCV 93  PLT 187   Lipid Panel: Recent Labs    08/18/20 1153 09/25/20 1541  CHOL 273* 210*  HDL 68 58.80  LDLCALC 165*  --   TRIG 221* 256.0*  CHOLHDL 4.0 4  LDLDIRECT  --  124.0   Lab Results  Component Value Date   HGBA1C 5.4 11/23/2018    Procedures since last visit: No results found.  Assessment/Plan Degenerative disc disease, cervical Referal made to Therapy If no improvement will plan for CT scan Cannot do MRI due to plate her neck Has failed Neurontin  Essential hypertension Increase Coreg to 6.25 mg BID Follow with Facility Nurse for BP  Hyperlipidemia, unspecified hyperlipidemia  type Will start her Rosuvastatin  Anxiety and depression Discontinue Cymbalta Reduce xanax to only  Night time  Diarrhea,  Resolved for now Will check For Celiac per daughters request   S/P TAVR (transcatheter aortic valve replacement) Follows with Cardiology Dental Prophlaxis  Hypothyroidism Follows with Dr Cruzita Lederer Labs/tests ordered:  * No order type specified * Next appt:  03/26/2021

## 2021-02-17 DIAGNOSIS — Z9181 History of falling: Secondary | ICD-10-CM | POA: Diagnosis not present

## 2021-02-17 DIAGNOSIS — M545 Low back pain, unspecified: Secondary | ICD-10-CM | POA: Diagnosis not present

## 2021-02-17 DIAGNOSIS — M6281 Muscle weakness (generalized): Secondary | ICD-10-CM | POA: Diagnosis not present

## 2021-02-18 DIAGNOSIS — M6281 Muscle weakness (generalized): Secondary | ICD-10-CM | POA: Diagnosis not present

## 2021-02-18 DIAGNOSIS — Z9181 History of falling: Secondary | ICD-10-CM | POA: Diagnosis not present

## 2021-02-18 DIAGNOSIS — M545 Low back pain, unspecified: Secondary | ICD-10-CM | POA: Diagnosis not present

## 2021-02-24 DIAGNOSIS — M6281 Muscle weakness (generalized): Secondary | ICD-10-CM | POA: Diagnosis not present

## 2021-02-24 DIAGNOSIS — Z9181 History of falling: Secondary | ICD-10-CM | POA: Diagnosis not present

## 2021-02-24 DIAGNOSIS — M545 Low back pain, unspecified: Secondary | ICD-10-CM | POA: Diagnosis not present

## 2021-02-27 ENCOUNTER — Other Ambulatory Visit: Payer: Self-pay | Admitting: Internal Medicine

## 2021-02-27 ENCOUNTER — Other Ambulatory Visit: Payer: Self-pay | Admitting: *Deleted

## 2021-02-27 DIAGNOSIS — M5030 Other cervical disc degeneration, unspecified cervical region: Secondary | ICD-10-CM

## 2021-02-27 MED ORDER — ALPRAZOLAM 0.5 MG PO TABS: 1.0000 mg | ORAL_TABLET | Freq: Every day | ORAL | 0 refills | Status: DC

## 2021-02-27 NOTE — Telephone Encounter (Signed)
[name], daughter called and stated that patient is concerned because she is out of her Alprazolam. Stated that she only received enough for 15 days. Patient takes 2 tablets at bedtime and only got #30 on 7/20.  Pended Refill Request.   Also, patient is wanting a referral placed to go to Outpatient PT at [facility]. Stated that she wants to switch to them because she has gone there before for dry needling which really helped.   Wants you to place a referral.   Please Advise.

## 2021-03-10 ENCOUNTER — Other Ambulatory Visit: Payer: Self-pay | Admitting: Physician Assistant

## 2021-03-19 ENCOUNTER — Encounter: Payer: Self-pay | Admitting: Physical Therapy

## 2021-03-19 ENCOUNTER — Ambulatory Visit: Payer: Medicare Other | Attending: Internal Medicine | Admitting: Physical Therapy

## 2021-03-19 ENCOUNTER — Other Ambulatory Visit: Payer: Self-pay

## 2021-03-19 DIAGNOSIS — R2689 Other abnormalities of gait and mobility: Secondary | ICD-10-CM | POA: Diagnosis not present

## 2021-03-19 DIAGNOSIS — R293 Abnormal posture: Secondary | ICD-10-CM | POA: Diagnosis not present

## 2021-03-19 DIAGNOSIS — M542 Cervicalgia: Secondary | ICD-10-CM | POA: Diagnosis not present
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
## 2021-03-23 ENCOUNTER — Ambulatory Visit: Payer: Medicare Other | Admitting: Physical Therapy

## 2021-03-23 ENCOUNTER — Other Ambulatory Visit: Payer: Self-pay

## 2021-03-23 ENCOUNTER — Encounter: Payer: Self-pay | Admitting: Physical Therapy

## 2021-03-23 DIAGNOSIS — M542 Cervicalgia: Secondary | ICD-10-CM | POA: Diagnosis not present

## 2021-03-23 DIAGNOSIS — R2689 Other abnormalities of gait and mobility: Secondary | ICD-10-CM | POA: Diagnosis not present

## 2021-03-23 DIAGNOSIS — R293 Abnormal posture: Secondary | ICD-10-CM | POA: Diagnosis not present

## 2021-03-23 NOTE — Patient Instructions (Signed)
Access Code: [email or web address] URL: [email or web address] Date: 03/23/2021 Prepared by: Jari Favre  Exercises Supine Deep Neck Flexor Nods - 1 x daily - 7 x weekly - 2 sets - 10 reps Open Book Chest Rotation Stretch on Foam 1/2 Roll - 1 x daily - 7 x weekly - 1 sets - 5 reps - 20 hold Seated Cervical Sidebending AROM - 3 x daily - 7 x weekly - 1 sets - 10 reps Seated Cervical Rotation AROM - 3 x daily - 7 x weekly - 1 sets - 10 reps - 5 hold

## 2021-03-23 NOTE — Therapy (Addendum)
[facility] [address], [place], [place], [address] Phone: [phone]   Fax:  [phone]  Physical Therapy Treatment  Patient Details  Name: Brenda [name] MRN: [id number] Date of Birth: 07/12/1931 Referring Provider (PT): Virgie Dad, MD   Encounter Date: 03/23/2021   PT End of Session - 03/23/21 1241     Visit Number 2    Date for PT Re-Evaluation 04/30/21    Authorization Type Medicare    PT Start Time 1148    PT Stop Time 1228    PT Time Calculation (min) 40 min    Activity Tolerance Patient tolerated treatment well    Behavior During Therapy [facility] for tasks assessed/performed             Past Medical History:  Diagnosis Date   Anxiety    Asthma    Barrett's esophagus    Cancer ([place])    DJD (degenerative joint disease)    GERD (gastroesophageal reflux disease)    Goiter    Hemorrhoids    History of colonoscopy    History of shingles    face/eye   History of subdural hematoma    History of traumatic subdural hematoma    Hx of colonic polyps    Hyperlipidemia    Hypertension    IBS (irritable bowel syndrome)    Osteoarthritis    S/P TAVR (transcatheter aortic valve replacement)    23 mm Edwards Sapien 3 transcatheter heart valve placed via percutaneous right transfemoral approach    Sarcoidosis    Sicca ([place])     Past Surgical History:  Procedure Laterality Date   BREAST LUMPECTOMY  1974   left   CATARACT EXTRACTION W/ INTRAOCULAR LENS  IMPLANT, BILATERAL     CRANIOTOMY Right 01/04/2014   Procedure: CRANIOTOMY HEMATOMA EVACUATION SUBDURAL;  Surgeon: [name], MD;  [facility];  Service: Neurosurgery;  Laterality: Right;   CRANIOTOMY N/A 01/08/2014   Procedure: Redo CRANIOTOMY HEMATOMA EVACUATION SUBDURAL RIGHT;  Surgeon: [name], MD;  Location: [place] NEURO ORS;  Service: Neurosurgery;  Laterality: N/A;  Redo CRANIOTOMY HEMATOMA EVACUATION SUBDURAL RIGHT    CRANIOTOMY Right 01/10/2014   Procedure: Redo CRANIOTOMY HEMATOMA EVACUATION SUBDURAL;  Surgeon: [name], MD;  Location: [place] [facility];  Service: Neurosurgery;  Laterality: Right;   DILATION AND CURETTAGE OF UTERUS     EYE SURGERY     NASAL SINUS SURGERY     NECK SURGERY     x 2   RIGHT/LEFT HEART CATH AND CORONARY ANGIOGRAPHY N/A 09/27/2018   Procedure: RIGHT/LEFT HEART CATH AND CORONARY ANGIOGRAPHY;  Surgeon: Burnell Blanks, MD;  Location: [place] CV LAB;  Service: Cardiovascular;  Laterality: N/A;   TEE WITHOUT CARDIOVERSION N/A 11/28/2018   Procedure: TRANSESOPHAGEAL ECHOCARDIOGRAM (TEE);  Surgeon: Sherren Mocha, MD;  Location: [place];  Service: Open Heart Surgery;  Laterality: N/A;   TONSILLECTOMY AND ADENOIDECTOMY  1976   TOTAL THYROIDECTOMY  06-05-08   TRANSCATHETER AORTIC VALVE REPLACEMENT, TRANSFEMORAL N/A 11/28/2018   Procedure: TRANSCATHETER AORTIC VALVE REPLACEMENT, TRANSFEMORAL;  Surgeon: Sherren Mocha, MD;  Location: [place];  Service: Open Heart Surgery;  Laterality: N/A;    There were no vitals filed for this visit.   Subjective Assessment - 03/23/21 1151     Subjective Pt states she has had dry needling in the past and it helped. Pt daughter is there to help due to pt memory issues    Patient is  accompained by: Family member   daughter   Currently in Pain? No/denies                               [facility] Adult PT Treatment/Exercise - 03/23/21 0001       Manual Therapy   Manual Therapy Soft tissue mobilization    Manual therapy comments supine (prone with DN)    Soft tissue mobilization cervical traction; paraspinals, upper traps,, suboccipitals, scalenes              Trigger Point Dry Needling - 03/23/21 0001     Consent Given? Yes    Education Handout Provided Previously provided    Muscles Treated Head and Neck Suboccipitals;Cervical multifidi;Upper trapezius    Upper Trapezius Response Twitch reponse elicited;Palpable  increased muscle length    Suboccipitals Response Twitch response elicited;Palpable increased muscle length    Cervical multifidi Response Twitch reponse elicited;Palpable increased muscle length                  PT Education - 03/23/21 1225     Education Details Access Code: [email or web address]    Person(s) Educated Patient    Methods Explanation;Demonstration;Tactile cues;Verbal cues;Handout    Comprehension Verbalized understanding;Returned demonstration              PT Short Term Goals - 03/19/21 1733       PT SHORT TERM GOAL #1   Title Patient will be independent with HEP for continued progression at home.    Time 3    Period Weeks    Status New    Target Date 04/09/21      PT SHORT TERM GOAL #2   Title Patient will improved cervical lateral flexion by at least 10 degrees to indicate decreased muscle tightness and improved symptoms.    Time 3    Period Weeks    Status New    Target Date 04/09/21               PT Long Term Goals - 03/19/21 1738       PT LONG TERM GOAL #1   Title Patient will be independent with advanced HEP for long term management of symptoms post D/C.    Baseline needs HEP    Time 6    Period Weeks    Status New    Target Date 04/30/21      PT LONG TERM GOAL #2   Title Patient will score 50 or higher on FOTO to indicate improved overall function.    Baseline 48    Time 6    Period Weeks    Status New    Target Date 04/30/21                   Plan - 03/23/21 1629     Clinical Impression Statement Pt tolerated treatment well.  Pt had some tenderness to suboccipitals. she responded well to soft tissue work around cervical paraspinals and some gentle traction.  She felt better after treatment.  Pt was educated and performed exercises to maintain improved motion after today's treatment    PT Treatment/Interventions ADLs/Self Care Home Management;Cryotherapy;Electrical Stimulation;Iontophoresis 4mg /ml Dexamethasone;Moist  Heat;Therapeutic exercise;Therapeutic activities;Patient/family education;Manual techniques;Passive range of motion;Dry needling;Taping;Spinal Manipulations;Joint Manipulations    PT Next Visit Plan f/u on intial HEP, DN #2 if it helped reduce pain, continue ROM, add thoracic extension and maybe bands for posture    Consulted and  Agree with Plan of Care Patient;Family member/caregiver             Patient will benefit from skilled therapeutic intervention in order to improve the following deficits and impairments:  Decreased activity tolerance, Decreased range of motion, Impaired flexibility, Increased muscle spasms, Increased fascial restricitons, Impaired UE functional use, Improper body mechanics, Pain  Visit Diagnosis: Cervicalgia  Abnormal posture  Other abnormalities of gait and mobility     Problem List Patient Active Problem List   Diagnosis Date Noted   Degenerative disc disease, cervical 07/22/2020   Degenerative disc disease, lumbar 07/22/2020   Hypotension 06/08/2019   Severe sepsis ([place]) 06/08/2019   Acute respiratory failure with hypoxia ([place]) 06/07/2019   Pneumonia due to COVID-19 virus 06/07/2019   Acute on chronic kidney failure (HCC)    Chronic diastolic CHF (congestive heart failure) ([place])    History of traumatic subdural hematoma    S/P TAVR (transcatheter aortic valve replacement)    Severe aortic stenosis    Neck arthritis 09/08/2018   Multiple fractures of ribs of left side 12/24/2013   Urinary retention 12/24/2013   Hypokalemia 07/12/2013   CAD (coronary artery disease) 04/20/2013   Hypothyroidism 07/09/2010   CONSTIPATION 07/09/2010   HEMATEMESIS 08/14/2008   Sarcoidosis 08/09/2008   Hyperlipidemia 08/09/2008   Anxiety 08/09/2008   Hypertension 08/09/2008   HEMORRHOIDS 08/09/2008   GERD 08/09/2008   BARRETTS ESOPHAGUS 08/09/2008   Irritable bowel syndrome 08/09/2008   DEGENERATIVE JOINT DISEASE 08/09/2008   COLONIC POLYPS, ADENOMATOUS, HX  OF 50/38/8828   HELICOBACTER PYLORI GASTRITIS, HX OF 08/09/2008    Brenda [name], PT 03/23/2021, 4:49 PM  [facility] [address], [place], [place], [address] Phone: [phone]   Fax:  [phone]  Name: Brenda [name] MRN: [id number] Date of Birth: July 26, 1931   PHYSICAL THERAPY DISCHARGE SUMMARY  Visits from Start of Care: 2  Current functional level related to goals / functional outcomes: See above   Remaining deficits: See above details, only one follow up visit   Education / Equipment: HEP   Patient agrees to discharge. Patient goals were not met. Patient is being discharged due to the patient's request.  Gustavus Bryant, PT 03/31/21 11:47 AM

## 2021-03-25 ENCOUNTER — Other Ambulatory Visit: Payer: Self-pay

## 2021-03-25 DIAGNOSIS — R197 Diarrhea, unspecified: Secondary | ICD-10-CM | POA: Diagnosis not present

## 2021-03-25 DIAGNOSIS — E785 Hyperlipidemia, unspecified: Secondary | ICD-10-CM | POA: Diagnosis not present

## 2021-03-26 ENCOUNTER — Other Ambulatory Visit: Payer: Self-pay

## 2021-03-26 DIAGNOSIS — E785 Hyperlipidemia, unspecified: Secondary | ICD-10-CM

## 2021-03-26 DIAGNOSIS — R197 Diarrhea, unspecified: Secondary | ICD-10-CM

## 2021-03-27 ENCOUNTER — Other Ambulatory Visit: Payer: Self-pay | Admitting: Internal Medicine

## 2021-03-27 ENCOUNTER — Encounter: Payer: Self-pay | Admitting: Internal Medicine

## 2021-03-27 NOTE — Telephone Encounter (Signed)
Patient has request refill on medication "Alprazolam 0.'5mg'$ ". Patient last refill was 02/27/2021. Medication pend and sent to [facility], NP. Please Advise.

## 2021-03-29 LAB — COMPLETE METABOLIC PANEL WITH GFR
AG Ratio: 1.8 (calc) (ref 1.0–2.5)
ALT: 13 U/L (ref 6–29)
AST: 17 U/L (ref 10–35)
Albumin: 4.4 g/dL (ref 3.6–5.1)
Alkaline phosphatase (APISO): 60 U/L (ref 37–153)
BUN: 23 mg/dL (ref 7–25)
CO2: 25 mmol/L (ref 20–32)
Calcium: 8.9 mg/dL (ref 8.6–10.4)
Chloride: 104 mmol/L (ref 98–110)
Creat: 0.92 mg/dL (ref 0.60–0.95)
Globulin: 2.4 g/dL (calc) (ref 1.9–3.7)
Glucose, Bld: 89 mg/dL (ref 65–99)
Potassium: 3.9 mmol/L (ref 3.5–5.3)
Sodium: 140 mmol/L (ref 135–146)
Total Bilirubin: 0.7 mg/dL (ref 0.2–1.2)
Total Protein: 6.8 g/dL (ref 6.1–8.1)
eGFR: 60 mL/min/{1.73_m2} (ref >=60)

## 2021-03-29 LAB — LIPID PANEL
Cholesterol: 176 mg/dL (ref <200)
HDL: 62 mg/dL (ref >=50)
LDL Cholesterol (Calc): 91 mg/dL (calc)
Non-HDL Cholesterol (Calc): 114 mg/dL (calc) (ref <130)
Total CHOL/HDL Ratio: 2.8 (calc) (ref <5.0)
Triglycerides: 136 mg/dL (ref <150)

## 2021-03-29 LAB — CELIAC DISEASE COMPREHENSIVE PANEL WITH REFLEXES
(tTG) Ab, IgA: <1 U/mL
Immunoglobulin A: 147 mg/dL (ref 70–320)

## 2021-03-29 LAB — CBC WITH DIFFERENTIAL/PLATELET

## 2021-03-31 ENCOUNTER — Encounter: Payer: Medicare Other | Admitting: Physical Therapy

## 2021-04-01 ENCOUNTER — Non-Acute Institutional Stay: Payer: Medicare Other | Admitting: Internal Medicine

## 2021-04-01 ENCOUNTER — Other Ambulatory Visit: Payer: Self-pay

## 2021-04-01 ENCOUNTER — Encounter: Payer: Self-pay | Admitting: Internal Medicine

## 2021-04-01 VITALS — BP 138/84 | HR 61 | Temp 96.2°F | Ht 63.5 in | Wt 146.5 lb

## 2021-04-01 DIAGNOSIS — Z952 Presence of prosthetic heart valve: Secondary | ICD-10-CM

## 2021-04-01 DIAGNOSIS — K2100 Gastro-esophageal reflux disease with esophagitis, without bleeding: Secondary | ICD-10-CM

## 2021-04-01 DIAGNOSIS — F32A Depression, unspecified: Secondary | ICD-10-CM

## 2021-04-01 DIAGNOSIS — E038 Other specified hypothyroidism: Secondary | ICD-10-CM | POA: Diagnosis not present

## 2021-04-01 DIAGNOSIS — I10 Essential (primary) hypertension: Secondary | ICD-10-CM

## 2021-04-01 DIAGNOSIS — M5030 Other cervical disc degeneration, unspecified cervical region: Secondary | ICD-10-CM | POA: Diagnosis not present

## 2021-04-01 DIAGNOSIS — E785 Hyperlipidemia, unspecified: Secondary | ICD-10-CM

## 2021-04-01 DIAGNOSIS — F419 Anxiety disorder, unspecified: Secondary | ICD-10-CM

## 2021-04-02 ENCOUNTER — Encounter: Payer: Medicare Other | Admitting: Physical Therapy

## 2021-04-05 NOTE — Progress Notes (Signed)
Location:  [place] of Service:  Clinic (12)  Provider:   Code Status:  Goals of Care:  Advanced Directives 04/01/2021  Does Patient Have a Medical Advance Directive? Yes  Type of Advance Directive Living will;Healthcare Power of Attorney  Does patient want to make changes to medical advance directive? No - Patient declined  Copy of [place] in Chart? Yes - validated most recent copy scanned in chart (See row information)  Would patient like information on creating a medical advance directive? -     Chief Complaint  Patient presents with   Medical Management of Chronic Issues    Patient returns to the clinic for 7 week follow up.    Quality Metric Gaps    Covid #1, Shingrix, Dexa scan, flu shot    HPI: Patient is a 87 y.o. female seen today for medical management of chronic diseases.     Has pain in her neck and lower back due to degenerative disease. She has seen Dr. Ortencia Kick.  Has not tolerated gabapentin in the past. He suggested physical therapy and possible Cymbalta for pain management. CT scan if symptoms persist.  Cannot do MRI due to Anterior plate  in her neck  Is not taking Cymbalta. Working with therapy Does not want any more testing right now Takes Tylenol   Depression With [place] as it is her Husbands Anniversary Says she is having Depression HLD Now Tolerating Crestor Diarrhea Resolved Celiac Negative  Also has history of TAVR in 11/2018 GE reflux disease with Barrett's Hypertension Skin cancer s/p Mohs surgery  Says she is allergic to Shingrix and not suppose to take Covid Booster  Past Medical History:  Diagnosis Date   Anxiety    Asthma    Barrett's esophagus    Cancer ([place])    DJD (degenerative joint disease)    GERD (gastroesophageal reflux disease)    Goiter    Hemorrhoids    History of colonoscopy    History of shingles    face/eye   History of subdural hematoma    History of  traumatic subdural hematoma    Hx of colonic polyps    Hyperlipidemia    Hypertension    IBS (irritable bowel syndrome)    Osteoarthritis    S/P TAVR (transcatheter aortic valve replacement)    23 mm Edwards Sapien 3 transcatheter heart valve placed via percutaneous right transfemoral approach    Sarcoidosis    Sicca ([place])     Past Surgical History:  Procedure Laterality Date   BREAST LUMPECTOMY  1974   left   CATARACT EXTRACTION W/ INTRAOCULAR LENS  IMPLANT, BILATERAL     CRANIOTOMY Right 01/04/2014   Procedure: CRANIOTOMY HEMATOMA EVACUATION SUBDURAL;  Surgeon: [name], MD;  [facility]: [facility];  Service: Neurosurgery;  Laterality: Right;   CRANIOTOMY N/A 01/08/2014   Procedure: Redo CRANIOTOMY HEMATOMA EVACUATION SUBDURAL RIGHT;  Surgeon: [name], MD;  Location: [facility];  Service: Neurosurgery;  Laterality: N/A;  Redo CRANIOTOMY HEMATOMA EVACUATION SUBDURAL RIGHT   CRANIOTOMY Right 01/10/2014   Procedure: Redo CRANIOTOMY HEMATOMA EVACUATION SUBDURAL;  Surgeon: [name], MD;  Location: [facility];  Service: Neurosurgery;  Laterality: Right;   DILATION AND CURETTAGE OF UTERUS     EYE SURGERY     NASAL SINUS SURGERY     NECK SURGERY     x 2   RIGHT/LEFT HEART CATH AND CORONARY ANGIOGRAPHY N/A  09/27/2018   Procedure: RIGHT/LEFT HEART CATH AND CORONARY ANGIOGRAPHY;  Surgeon: Burnell Blanks, MD;  Location: [facility];  Service: Cardiovascular;  Laterality: N/A;   TEE WITHOUT CARDIOVERSION N/A 11/28/2018   Procedure: TRANSESOPHAGEAL ECHOCARDIOGRAM (TEE);  Surgeon: Sherren Mocha, MD;  Location: [facility];  Service: Open Heart Surgery;  Laterality: N/A;   TONSILLECTOMY AND ADENOIDECTOMY  1976   TOTAL THYROIDECTOMY  06-05-08   TRANSCATHETER AORTIC VALVE REPLACEMENT, TRANSFEMORAL N/A 11/28/2018   Procedure: TRANSCATHETER AORTIC VALVE REPLACEMENT, TRANSFEMORAL;  Surgeon: Sherren Mocha, MD;  Location: [facility];  Service: Open Heart Surgery;  Laterality:  N/A;    Allergies  Allergen Reactions   Losartan Potassium Other (See Comments)   Zoster Vaccine Live Other (See Comments)    Outpatient Encounter Medications as of 04/01/2021  Medication Sig   acetaminophen (TYLENOL) 650 MG CR tablet Take 650 mg by mouth 2 (two) times daily.   ALPRAZolam (XANAX) 0.5 MG tablet TAKE 2 TABLETS(1 MG) BY MOUTH AT BEDTIME   amoxicillin (AMOXIL) 500 MG tablet TAKE 4 TABSULES ONE HOUR PRIOR TO ALL DENTAL VISITS   aspirin 81 MG chewable tablet Chew 1 tablet (81 mg total) by mouth daily.   Bacillus Coagulans-Inulin (PROBIOTIC-PREBIOTIC PO) Take 1 capsule by mouth daily at 6 (six) AM.   carvedilol (COREG) 6.25 MG tablet Take 1 tablet (6.25 mg total) by mouth 2 (two) times daily with a meal.   Cholecalciferol (VITAMIN D) 50 MCG (2000 UT) tablet Take 2,000 Units by mouth at bedtime.   cycloSPORINE (RESTASIS) 0.05 % ophthalmic emulsion Place 1 drop into both eyes every 12 (twelve) hours.   levothyroxine (SYNTHROID) 75 MCG tablet TAKE 1 TABLET(75 MCG) BY MOUTH DAILY   Melatonin 10 MG CAPS Take 1 capsule by mouth at bedtime.   Multiple Vitamins-Minerals (B COMPLEX PLUS VITAMIN C PO) Take 1 capsule by mouth daily at 6 (six) AM.   rosuvastatin (CRESTOR) 5 MG tablet Take 1 tablet (5 mg total) by mouth daily.   senna-docusate (SENOKOT-S) 8.6-50 MG tablet Take 3 tablets by mouth at bedtime.    vitamin E 180 MG (400 UNITS) capsule Take 400 Units by mouth daily.   Zinc 50 MG CAPS Take by mouth.   [DISCONTINUED] Misc Natural Products (ELDERBERRY ZINC/VIT C/IMMUNE MT) Use as directed 2 capsules in the mouth or throat daily at 6 (six) AM.   [DISCONTINUED] ALPRAZolam (XANAX) 0.5 MG tablet TAKE 1 TABLET BY MOUTH EVERY MORNING AND 2 TABLETS EVERY NIGHT AT BEDTIME (Patient taking differently: 1 mg at bedtime.)   [DISCONTINUED] Turmeric (QC TUMERIC COMPLEX PO) Take by mouth daily. 2 gummies   No facility-administered encounter medications on file as of 04/01/2021.    Review of  Systems:  Review of Systems  Constitutional: Negative.   HENT: Negative.    Respiratory: Negative.    Cardiovascular: Negative.   Gastrointestinal: Negative.   Genitourinary: Negative.   Musculoskeletal:  Positive for back pain and neck pain.  Skin: Negative.   Neurological: Negative.   Psychiatric/Behavioral:  Positive for dysphoric mood and sleep disturbance. The patient is nervous/anxious.    Health Maintenance  Topic Date Due   COVID-19 Vaccine (1) Never done   Zoster Vaccines- Shingrix (1 of 2) Never done   DEXA SCAN  Never done   INFLUENZA VACCINE  02/23/2021   TETANUS/TDAP  12/19/2023   PNA vac Low Risk Adult  Completed   HPV VACCINES  Aged Out    Physical Exam: Vitals:   04/01/21 1449  BP:  138/84  Pulse: 61  Temp: (!) 96.2 F (35.7 C)  SpO2: 96%  Weight: 146 lb 8 oz (66.5 kg)  Height: 5' 3.5" (1.613 m)   Body mass index is 25.54 kg/m. Physical Exam Constitutional: Oriented to person, place, and time. Well-developed and well-nourished.  HENT:  Head: Normocephalic.  Mouth/Throat: Oropharynx is clear and moist.  Eyes: Pupils are equal, round, and reactive to light.  Neck: Neck supple.  Cardiovascular: Normal rate and normal heart sounds.  murmur heard. Pulmonary/Chest: Effort normal and breath sounds normal. No respiratory distress. No wheezes. She has no rales.  Abdominal: Soft. Bowel sounds are normal. No distension. There is no tenderness. There is no rebound.  Musculoskeletal: No edema.  Lymphadenopathy: none Neurological: Alert and oriented to person, place, and time.  Skin: Skin is warm and dry.  Psychiatric: Normal mood and affect. Behavior is normal. Thought content normal.   Labs reviewed: Basic Metabolic Panel: Recent Labs    08/18/20 1153 09/25/20 1541 03/25/21 0902  NA 139  --  140  K 4.1  --  3.9  CL 102  --  104  CO2 22  --  25  GLUCOSE 101*  --  89  BUN 20  --  23  CREATININE 0.83  --  0.92  CALCIUM 9.4  --  8.9  TSH  --  2.75   --    Liver Function Tests: Recent Labs    03/25/21 0902  AST 17  ALT 13  BILITOT 0.7  PROT 6.8   No results for input(s): LIPASE, AMYLASE in the last 8760 hours. No results for input(s): AMMONIA in the last 8760 hours. CBC: Recent Labs    08/18/20 1153 03/25/21 0902  WBC 8.2 CANCELED  HGB 14.7  --   HCT 42.8  --   MCV 93  --   PLT 187  --    Lipid Panel: Recent Labs    08/18/20 1153 09/25/20 1541 03/25/21 0902  CHOL 273* 210* 176  HDL 68 58.80 62  LDLCALC 165*  --  91  TRIG 221* 256.0* 136  CHOLHDL 4.0 4 2.8  LDLDIRECT  --  124.0  --    Lab Results  Component Value Date   HGBA1C 5.4 11/23/2018    Procedures since last visit: No results found.  Assessment/Plan Essential hypertension Coreg Was increased last visit BP better now Anxiety and depression Restart Cymbalta Patient has the Pills Continue Xanax Degenerative disc disease, cervical Work with therapy Has failed Neurontin Start Cymbalta will help Follow with Dr Tamala Julian if not better Hyperlipidemia, unspecified hyperlipidemia type Tolerating Crestor LDL less then 100 S/P TAVR (transcatheter aortic valve replacement) Follows with Dr Harrington [facility] Prophylaxis   hypothyroidism On Synthroid Follows with Dr Renne Crigler  She says she cannot do Shingrix as she ia allergic and doe snto want to take Covid Booster right now   Labs/tests ordered:  * No order type specified * Next appt:  07/29/2021

## 2021-04-06 ENCOUNTER — Encounter: Payer: Medicare Other | Admitting: Physical Therapy

## 2021-04-13 ENCOUNTER — Encounter: Payer: Medicare Other | Admitting: Physical Therapy

## 2021-04-17 ENCOUNTER — Other Ambulatory Visit: Payer: Self-pay | Admitting: Internal Medicine

## 2021-04-20 ENCOUNTER — Encounter: Payer: Medicare Other | Admitting: Physical Therapy

## 2021-04-22 ENCOUNTER — Encounter: Payer: Medicare Other | Admitting: Physical Therapy

## 2021-04-23 ENCOUNTER — Other Ambulatory Visit: Payer: Self-pay | Admitting: Nurse Practitioner

## 2021-04-27 ENCOUNTER — Other Ambulatory Visit: Payer: Self-pay | Admitting: *Deleted

## 2021-04-27 MED ORDER — ROSUVASTATIN CALCIUM 5 MG PO TABS: 5.0000 mg | ORAL_TABLET | Freq: Every day | ORAL | 3 refills | Status: DC

## 2021-04-27 NOTE — Telephone Encounter (Signed)
Pharmacy requested refill

## 2021-05-25 ENCOUNTER — Encounter: Payer: Self-pay | Admitting: Internal Medicine

## 2021-05-25 ENCOUNTER — Ambulatory Visit (INDEPENDENT_AMBULATORY_CARE_PROVIDER_SITE_OTHER): Payer: Medicare Other | Admitting: Internal Medicine

## 2021-05-25 ENCOUNTER — Other Ambulatory Visit: Payer: Self-pay | Admitting: Internal Medicine

## 2021-05-25 ENCOUNTER — Other Ambulatory Visit: Payer: Self-pay

## 2021-05-25 VITALS — BP 150/72 | HR 77 | Ht 63.5 in | Wt 146.6 lb

## 2021-05-25 DIAGNOSIS — I359 Nonrheumatic aortic valve disorder, unspecified: Secondary | ICD-10-CM

## 2021-05-25 MED ORDER — AMLODIPINE BESYLATE 2.5 MG PO TABS: 2.5000 mg | ORAL_TABLET | Freq: Every day | ORAL | 3 refills | Status: DC

## 2021-05-25 NOTE — Patient Instructions (Signed)
Medication Instructions:   START Amlodipine one tablet by mouth (2.5 mg) daily.  First couple of days take one half tablet by mouth.   *If you need a refill on your cardiac medications before your next appointment, please call your pharmacy*   Lab Work:  -None  If you have labs (blood work) drawn today and your tests are completely normal, you will receive your results only by: [place] (if you have MyChart) OR A paper copy in the mail If you have any lab test that is abnormal or we need to change your treatment, we will call you to review the results.   Testing/Procedures:  -None   Follow-Up: At [facility], you and your health needs are our priority.  As part of our continuing mission to provide you with exceptional heart care, we have created designated Provider Care Teams.  These Care Teams include your primary Cardiologist (physician) and Advanced Practice Providers (APPs -  Physician Assistants and Nurse Practitioners) who all work together to provide you with the care you need, when you need it.  We recommend signing up for the patient portal called "MyChart".  Sign up information is provided on this After Visit Summary.  MyChart is used to connect with patients for Virtual Visits (Telemedicine).  Patients are able to view lab/test results, encounter notes, upcoming appointments, etc.  Non-urgent messages can be sent to your provider as well.   To learn more about what you can do with MyChart, go to [email or web address].    Your next appointment:   9 month(s)  The format for your next appointment:   In Person  Provider:   [name], MD   Other Instructions  Your physician wants you to follow-up in: 9 month with Dr.Ross.  You will receive a reminder letter in the mail two months in advance. If you don't receive a letter, please call our office to schedule the follow-up appointment.

## 2021-05-25 NOTE — Telephone Encounter (Signed)
Patient has request refill on medication "Xanax". Patient last refill was 04/23/2021. Patient doesn't have Non Opioid Contract on file. Medication pend and sent to Mast Man, NP for approval. Please Advise.

## 2021-05-25 NOTE — Progress Notes (Signed)
Cardiology Office Note   Date:  05/25/2021   ID:  Brenda [name] 27-May-1931, MRN [id number]  PCP:  Virgie Dad, MD  Cardiologist:   [name], MD   F/U of AV disease and HTN      History of Present Illness: Brenda [name] is a 87 y.o. female with a history of aortic stenosis s/p TAVR 11/28/18, HTN, HLD, sarcoidosis, GE reflux disease with Barrett's esophagus, chronic  cervical spine issue  and osteoarthritis    She underwent successful TAVR with a 23 mm Edwards Sapien 3 THV via the TF approach on 11/28/2018. Post operative echo showed normally functioning TAVR with mean gradient of 8 mm Hg and no PVL. She was started on norvasc 5 mg on follow up for elevated blood pressure.  She was last in [place] in Jan 2022.    Since seen she has done OK from a cardiac standpont.   Breathing is OK   No dizzienss   No CP   No palpitations     Current Meds  Medication Sig   acetaminophen (TYLENOL) 650 MG CR tablet Take 650 mg by mouth 2 (two) times daily.   ALPRAZolam (XANAX) 0.5 MG tablet TAKE 2 TABLETS(1 MG) BY MOUTH AT BEDTIME   amoxicillin (AMOXIL) 500 MG tablet TAKE 4 TABSULES ONE HOUR PRIOR TO ALL DENTAL VISITS   aspirin 81 MG chewable tablet Chew 1 tablet (81 mg total) by mouth daily.   Bacillus Coagulans-Inulin (PROBIOTIC-PREBIOTIC PO) Take 1 capsule by mouth daily at 6 (six) AM.   carvedilol (COREG) 6.25 MG tablet Take 1 tablet (6.25 mg total) by mouth 2 (two) times daily with a meal.   Cholecalciferol (VITAMIN D) 50 MCG (2000 UT) tablet Take 2,000 Units by mouth at bedtime.   cycloSPORINE (RESTASIS) 0.05 % ophthalmic emulsion Place 1 drop into both eyes every 12 (twelve) hours.   levothyroxine (SYNTHROID) 75 MCG tablet TAKE 1 TABLET(75 MCG) BY MOUTH DAILY   Melatonin 10 MG CAPS Take 1 capsule by mouth at bedtime.   Multiple Vitamins-Minerals (B COMPLEX PLUS VITAMIN C PO) Take 1 capsule by mouth daily at 6 (six) AM.   rosuvastatin (CRESTOR) 5 MG tablet Take 1 tablet (5 mg total) by  mouth daily.   senna-docusate (SENOKOT-S) 8.6-50 MG tablet Take 3 tablets by mouth at bedtime.    vitamin E 180 MG (400 UNITS) capsule Take 400 Units by mouth daily.   Zinc 50 MG CAPS Take by mouth.     Allergies:   Losartan potassium and Zoster vaccine live   Past Medical History:  Diagnosis Date   Anxiety    Asthma    Barrett's esophagus    Cancer ([place])    DJD (degenerative joint disease)    GERD (gastroesophageal reflux disease)    Goiter    Hemorrhoids    History of colonoscopy    History of shingles    face/eye   History of subdural hematoma    History of traumatic subdural hematoma    Hx of colonic polyps    Hyperlipidemia    Hypertension    IBS (irritable bowel syndrome)    Osteoarthritis    S/P TAVR (transcatheter aortic valve replacement)    23 mm Edwards Sapien 3 transcatheter heart valve placed via percutaneous right transfemoral approach    Sarcoidosis    Sicca ([place])     Past Surgical History:  Procedure Laterality Date   BREAST LUMPECTOMY  1974   left   CATARACT  EXTRACTION W/ INTRAOCULAR LENS  IMPLANT, BILATERAL     CRANIOTOMY Right 01/04/2014   Procedure: CRANIOTOMY HEMATOMA EVACUATION SUBDURAL;  Surgeon: [name], MD;  Location: [facility];  Service: Neurosurgery;  Laterality: Right;   CRANIOTOMY N/A 01/08/2014   Procedure: Redo CRANIOTOMY HEMATOMA EVACUATION SUBDURAL RIGHT;  Surgeon: [name], MD;  Location: [facility];  Service: Neurosurgery;  Laterality: N/A;  Redo CRANIOTOMY HEMATOMA EVACUATION SUBDURAL RIGHT   CRANIOTOMY Right 01/10/2014   Procedure: Redo CRANIOTOMY HEMATOMA EVACUATION SUBDURAL;  Surgeon: [name], MD;  Location: [facility];  Service: Neurosurgery;  Laterality: Right;   DILATION AND CURETTAGE OF UTERUS     EYE SURGERY     NASAL SINUS SURGERY     NECK SURGERY     x 2   RIGHT/LEFT HEART CATH AND CORONARY ANGIOGRAPHY N/A 09/27/2018   Procedure: RIGHT/LEFT HEART CATH AND CORONARY ANGIOGRAPHY;  Surgeon:  Burnell Blanks, MD;  Location: [facility];  Service: Cardiovascular;  Laterality: N/A;   TEE WITHOUT CARDIOVERSION N/A 11/28/2018   Procedure: TRANSESOPHAGEAL ECHOCARDIOGRAM (TEE);  Surgeon: Sherren Mocha, MD;  Location: [facility];  Service: Open Heart Surgery;  Laterality: N/A;   TONSILLECTOMY AND ADENOIDECTOMY  1976   TOTAL THYROIDECTOMY  06-05-08   TRANSCATHETER AORTIC VALVE REPLACEMENT, TRANSFEMORAL N/A 11/28/2018   Procedure: TRANSCATHETER AORTIC VALVE REPLACEMENT, TRANSFEMORAL;  Surgeon: Sherren Mocha, MD;  Location: [facility];  Service: Open Heart Surgery;  Laterality: N/A;     Social History:  The patient  reports that she has never smoked. She has never used smokeless tobacco. She reports current alcohol use. She reports that she does not use drugs.   Family History:  The patient's family history includes Allergies in her sister, sister, sister, and sister; Asthma in her sister; Colon cancer in her sister; Heart disease in her father; Heart failure in her father; Hyperlipidemia in her father and mother; Hypertension in her brother, father, mother, and sister; Kidney cancer in her maternal grandmother and mother; Lymphoma in her mother; Rheum arthritis in her father; Thyroid cancer in her sister.    ROS:  Please see the history of present illness. All other systems are reviewed and  Negative to the above problem except as noted.    PHYSICAL EXAM: VS:  BP (!) 150/72 (BP Location: Left Arm, Patient Position: Sitting, Cuff Size: Normal)   Pulse 77   Ht 5' 3.5" (1.613 m)   Wt 146 lb 9.6 oz (66.5 kg)   SpO2 94%   BMI 25.56 kg/m   GEN: Well nourished, well developed, in no acute distress  HEENT: normal  Neck: no JVD,  Cardiac: RRR; No significant murmrus  Respiratory:  clear to auscultation bilaterally,  GI: soft, nontender, nondistended, + BS  No hepatomegaly  MS: no deformity Moving all extremities   Skin: warm and dry, no rash Neuro:  Strength and sensation are  intact Psych: euthymic mood, full affect   EKG:  EKG is not ordered today     Lipid Panel    Component Value Date/Time   CHOL 176 03/25/2021 0902   CHOL 273 (H) 08/18/2020 1153   TRIG 136 03/25/2021 0902   HDL 62 03/25/2021 0902   HDL 68 08/18/2020 1153   CHOLHDL 2.8 03/25/2021 0902   VLDL 51.2 (H) 09/25/2020 1541   LDLCALC 91 03/25/2021 0902   LDLDIRECT 124.0 09/25/2020 1541      Wt Readings from Last 3 Encounters:  05/25/21 146 lb 9.6 oz (  66.5 kg)  04/01/21 146 lb 8 oz (66.5 kg)  02/11/21 148 lb 6.4 oz (67.3 kg)      ASSESSMENT AND PLAN:  1  AV disease   Pt s/p TAVR in May 2020   Echo after shows valve is functioning well    Valve sounds unchanged  Follow clinically     2  HTN  BP remains a little high   Would go ahead and add amlodipien     2.5 mg   Call/Mychart with BP   3   HL   LDL 91, HDL 62     4  Dizziness  Asymptomatic      Current medicines are reviewed at length with the patient today.  The patient does not have concerns regarding medicines.  Signed, [name], MD  05/25/2021 4:00 PM    [facility] [address], [place], [place]  [address] Phone: [phone]; Fax: [phone]

## 2021-06-01 DIAGNOSIS — Z20822 Contact with and (suspected) exposure to covid-19: Secondary | ICD-10-CM | POA: Diagnosis not present

## 2021-07-03 ENCOUNTER — Other Ambulatory Visit: Payer: Self-pay | Admitting: Internal Medicine

## 2021-07-24 ENCOUNTER — Encounter: Payer: Self-pay | Admitting: Nurse Practitioner

## 2021-07-24 ENCOUNTER — Telehealth (INDEPENDENT_AMBULATORY_CARE_PROVIDER_SITE_OTHER): Payer: Medicare Other | Admitting: Nurse Practitioner

## 2021-07-24 ENCOUNTER — Other Ambulatory Visit: Payer: Self-pay

## 2021-07-24 DIAGNOSIS — U071 COVID-19: Secondary | ICD-10-CM | POA: Diagnosis not present

## 2021-07-24 MED ORDER — NIRMATRELVIR/RITONAVIR (PAXLOVID)TABLET: 3.0000 | ORAL_TABLET | Freq: Two times a day (BID) | ORAL | 0 refills | Status: AC

## 2021-07-24 NOTE — Patient Instructions (Signed)
-  make sure to sit up and deep breath throughout the day.  -Netipot or saline wash daily Plain nasal saline spray throughout the day as needed May use tylenol 500 mg 2 tablets every 8 hours as needed aches and pains or sore throat humidifier in the home to help with the dry air Mucinex by mouth twice daily as needed for cough and chest congestion with full glass of water  Keep well hydrated Proper nutrition  Avoid forcefully blowing nose Vit c 1000 mg daily Vit d 2000 units daily  Zinc 50 mg daily  -she has not been vaccinated and high risk for complications related to covid.  - nirmatrelvir/ritonavir EUA (PAXLOVID) 20 x 150 MG & 10 x 100MG  TABS; Take 3 tablets by mouth 2 (two) times daily for 5 days. (Take nirmatrelvir 150 mg two tablets twice daily for 5 days and ritonavir 100 mg one tablet twice daily for 5 days) Patient GFR is 60  Dispense: 30 tablet; Refill: 0 -Your test for COVID-19 was positive, meaning that you were infected with the coronavirus and could give the germ to others.  Please continue isolation at home for at least 5 days since the start of your symptoms. On day 5, as long as your symptoms are improving and you are not having a fever, you can return to normal activities, ensuring you are wearing a mask at all times when not at home.   Once you complete your 10 days you are good to resume normal acitivities.

## 2021-07-24 NOTE — Progress Notes (Signed)
Careteam: Patient Care Team: Virgie Dad, MD as PCP - General (Internal Medicine) Fay Records, MD as PCP - Cardiology (Cardiology) Gavin Pound, MD as Referring Physician (Internal Medicine) [facility], Jennefer [name], MD as Referring Physician (Dermatology) Sherlynn Stalls, MD as Consulting Physician (Ophthalmology) Vicie Mutters, MD as Attending Physician (Otolaryngology) Madelon Lips, MD as Consulting Physician (Nephrology) Jovita Kussmaul, PT (Endocrinology)  Advanced Directive information    Allergies  Allergen Reactions   Losartan Potassium Other (See Comments)   Zoster Vaccine Live Other (See Comments)    Chief Complaint  Patient presents with   Acute Visit    Covid symptoms, headache, chest congestion, low grade fever (99.0) x 2 days. At home covid test was positive. Visit completed via telephone/video.      HPI: Patient is a 87 y.o. female for positive COVID.  Reports she has been feeling bad for 3 days.  Yesterday she started feeling worse.  Temperature was 100 last night and 99.5 today.  Reports dry cough, chest congestion and wheezing.  Daughter got her some mucinex.   Reports she has had COVID in 2020 and required hospitalization.  Reports she has not had COVID vaccine due to having severe reaction to shingles vaccine.  She is very anxious about having COVID.  Checking pulse ox 95%  Review of Systems:  Review of Systems  Constitutional:  Positive for chills, fever and malaise/fatigue.  HENT:  Positive for congestion. Negative for sore throat.   Respiratory:  Positive for cough, shortness of breath and wheezing. Negative for sputum production.   Neurological:  Positive for headaches. Negative for dizziness.   Past Medical History:  Diagnosis Date   Anxiety    Asthma    Barrett's esophagus    Cancer (HCC)    DJD (degenerative joint disease)    GERD (gastroesophageal reflux disease)    Goiter    Hemorrhoids    History of colonoscopy     History of shingles    face/eye   History of subdural hematoma    History of traumatic subdural hematoma    Hx of colonic polyps    Hyperlipidemia    Hypertension    IBS (irritable bowel syndrome)    Osteoarthritis    S/P TAVR (transcatheter aortic valve replacement)    23 mm Edwards Sapien 3 transcatheter heart valve placed via percutaneous right transfemoral approach    Sarcoidosis    Sicca ([place])    Past Surgical History:  Procedure Laterality Date   BREAST LUMPECTOMY  1974   left   CATARACT EXTRACTION W/ INTRAOCULAR LENS  IMPLANT, BILATERAL     CRANIOTOMY Right 01/04/2014   Procedure: CRANIOTOMY HEMATOMA EVACUATION SUBDURAL;  Surgeon: [name], MD;  Location: [facility];  Service: Neurosurgery;  Laterality: Right;   CRANIOTOMY N/A 01/08/2014   Procedure: Redo CRANIOTOMY HEMATOMA EVACUATION SUBDURAL RIGHT;  Surgeon: [name], MD;  Location: [facility];  Service: Neurosurgery;  Laterality: N/A;  Redo CRANIOTOMY HEMATOMA EVACUATION SUBDURAL RIGHT   CRANIOTOMY Right 01/10/2014   Procedure: Redo CRANIOTOMY HEMATOMA EVACUATION SUBDURAL;  Surgeon: [name], MD;  Location: [place] [facility];  Service: Neurosurgery;  Laterality: Right;   DILATION AND CURETTAGE OF UTERUS     EYE SURGERY     NASAL SINUS SURGERY     NECK SURGERY     x 2   RIGHT/LEFT HEART CATH AND CORONARY ANGIOGRAPHY N/A 09/27/2018   Procedure: RIGHT/LEFT HEART CATH AND CORONARY ANGIOGRAPHY;  Surgeon: Lauree Chandler  D, MD;  Location: [place] [facility];  Service: Cardiovascular;  Laterality: N/A;   TEE WITHOUT CARDIOVERSION N/A 11/28/2018   Procedure: TRANSESOPHAGEAL ECHOCARDIOGRAM (TEE);  Surgeon: Sherren Mocha, MD;  Location: [place];  Service: Open Heart Surgery;  Laterality: N/A;   TONSILLECTOMY AND ADENOIDECTOMY  1976   TOTAL THYROIDECTOMY  06-05-08   TRANSCATHETER AORTIC VALVE REPLACEMENT, TRANSFEMORAL N/A 11/28/2018   Procedure: TRANSCATHETER AORTIC VALVE REPLACEMENT, TRANSFEMORAL;  Surgeon:  Sherren Mocha, MD;  Location: [place];  Service: Open Heart Surgery;  Laterality: N/A;   Social History:   reports that she has never smoked. She has never used smokeless tobacco. She reports current alcohol use. She reports that she does not use drugs.  Family History  Problem Relation Age of Onset   Hypertension Father    Hyperlipidemia Father    Rheum arthritis Father    Heart failure Father    Heart disease Father    Hypertension Mother    Hyperlipidemia Mother    Kidney cancer Mother    Lymphoma Mother    Hypertension Brother    Hypertension Sister    Allergies Sister    Asthma Sister    Allergies Sister    Thyroid cancer Sister    Allergies Sister    Colon cancer Sister    Allergies Sister    Kidney cancer Maternal Grandmother     Medications: Patient's Medications  New Prescriptions   No medications on file  Previous Medications   ACETAMINOPHEN (TYLENOL) 650 MG CR TABLET    Take 650 mg by mouth 2 (two) times daily.   ALPRAZOLAM (XANAX) 0.5 MG TABLET    TAKE 2 TABLETS(1 MG) BY MOUTH AT BEDTIME   AMLODIPINE (NORVASC) 2.5 MG TABLET    Take 1 tablet (2.5 mg total) by mouth daily.   AMOXICILLIN (AMOXIL) 500 MG TABLET    TAKE 4 TABSULES ONE HOUR PRIOR TO ALL DENTAL VISITS   ASPIRIN 81 MG CHEWABLE TABLET    Chew 1 tablet (81 mg total) by mouth daily.   BACILLUS COAGULANS-INULIN (PROBIOTIC-PREBIOTIC PO)    Take 1 capsule by mouth daily at 6 (six) AM.   CARVEDILOL (COREG) 6.25 MG TABLET    TAKE 1 TABLET(6.25 MG) BY MOUTH TWICE DAILY WITH A MEAL   CHOLECALCIFEROL (VITAMIN D) 50 MCG (2000 UT) TABLET    Take 2,000 Units by mouth at bedtime.   CYCLOSPORINE (RESTASIS) 0.05 % OPHTHALMIC EMULSION    Place 1 drop into both eyes every 12 (twelve) hours.   LEVOTHYROXINE (SYNTHROID) 75 MCG TABLET    TAKE 1 TABLET(75 MCG) BY MOUTH DAILY   MELATONIN 10 MG CAPS    Take 1 capsule by mouth as needed.   MULTIPLE VITAMINS-MINERALS (B COMPLEX PLUS VITAMIN C PO)    Take 1 capsule by mouth daily  at 6 (six) AM.   ROSUVASTATIN (CRESTOR) 5 MG TABLET    Take 1 tablet (5 mg total) by mouth daily.   SENNA-DOCUSATE (SENOKOT-S) 8.6-50 MG TABLET    Take 3 tablets by mouth at bedtime.    VITAMIN E 180 MG (400 UNITS) CAPSULE    Take 400 Units by mouth daily.   ZINC 50 MG CAPS    Take by mouth.  Modified Medications   No medications on file  Discontinued Medications   No medications on file    Physical Exam:  There were no vitals filed for this visit. There is no height or weight on file to calculate BMI. Wt Readings from  Last 3 Encounters:  05/25/21 146 lb 9.6 oz (66.5 kg)  04/01/21 146 lb 8 oz (66.5 kg)  02/11/21 148 lb 6.4 oz (67.3 kg)    Physical Exam Constitutional:      Appearance: Normal appearance.  Neurological:     Mental Status: She is alert and oriented to person, place, and time.    Labs reviewed: Basic Metabolic Panel: Recent Labs    08/18/20 1153 09/25/20 1541 03/25/21 0902  NA 139  --  140  K 4.1  --  3.9  CL 102  --  104  CO2 22  --  25  GLUCOSE 101*  --  89  BUN 20  --  23  CREATININE 0.83  --  0.92  CALCIUM 9.4  --  8.9  TSH  --  2.75  --    Liver Function Tests: Recent Labs    03/25/21 0902  AST 17  ALT 13  BILITOT 0.7  PROT 6.8   No results for input(s): LIPASE, AMYLASE in the last 8760 hours. No results for input(s): AMMONIA in the last 8760 hours. CBC: Recent Labs    08/18/20 1153 03/25/21 0902  WBC 8.2 CANCELED  HGB 14.7  --   HCT 42.8  --   MCV 93  --   PLT 187  --    Lipid Panel: Recent Labs    08/18/20 1153 09/25/20 1541 03/25/21 0902  CHOL 273* 210* 176  HDL 68 58.80 62  LDLCALC 165*  --  91  TRIG 221* 256.0* 136  CHOLHDL 4.0 4 2.8  LDLDIRECT  --  124.0  --    TSH: Recent Labs    09/25/20 1541  TSH 2.75   A1C: Lab Results  Component Value Date   HGBA1C 5.4 11/23/2018     Assessment/Plan 1. COVID-19 -make sure to sit up and deep breath throughout the day.  -Netipot or saline wash daily Plain nasal  saline spray throughout the day as needed May use tylenol 500 mg 2 tablets every 8 hours as needed aches and pains or sore throat humidifier in the home to help with the dry air Mucinex by mouth twice daily as needed for cough and chest congestion with full glass of water  Keep well hydrated Proper nutrition  Avoid forcefully blowing nose Vit c 1000 mg daily Vit d 2000 units daily  Zinc 50 mg daily  -she has not been vaccinated and high risk for complications related to covid.  - nirmatrelvir/ritonavir EUA (PAXLOVID) 20 x 150 MG & 10 x 100MG  TABS; Take 3 tablets by mouth 2 (two) times daily for 5 days. (Take nirmatrelvir 150 mg two tablets twice daily for 5 days and ritonavir 100 mg one tablet twice daily for 5 days) Patient GFR is 60  Dispense: 30 tablet; Refill: 0 -Your test for COVID-19 was positive, meaning that you were infected with the coronavirus and could give the germ to others.  Please continue isolation at home for at least 5 days since the start of your symptoms. On day 5, as long as your symptoms are improving and you are not having a fever, you can return to normal activities, ensuring you are wearing a mask at all times when not at home.   Once you complete your 10 days you are good to resume normal acitivities.    Strict follow up precautions  Kenta Laster K. Harle Battiest  [facility] [phone]    Virtual Visit via [place]  I connected with patient on 07/24/21 at  4:15 PM EST by video and verified that I am speaking with the correct person using two identifiers.  Location: Patient: home Provider: psc   I discussed the limitations, risks, security and privacy concerns of performing an evaluation and management service by telephone and the availability of in person appointments. I also discussed with the patient that there may be a patient responsible charge related to this service. The patient expressed understanding and agreed to  proceed.   I discussed the assessment and treatment plan with the patient. The patient was provided an opportunity to ask questions and all were answered. The patient agreed with the plan and demonstrated an understanding of the instructions.   The patient was advised to call back or seek an in-person evaluation if the symptoms worsen or if the condition fails to improve as anticipated.  I provided 18 minutes of non-face-to-face time during this encounter.  Carlos American. Harle Battiest Avs printed and mailed

## 2021-07-24 NOTE — Progress Notes (Signed)
° °  This service is provided via telemedicine  No vital signs collected/recorded due to the encounter was a telemedicine visit.   Location of patient (ex: home, work):  Home  Patient consents to a telephone visit: Yes, see telephone encounter dated 07/24/21  Location of the provider (ex: office, home):  [facility], Office   Name of any referring provider:  N/A  Names of all persons participating in the telemedicine service and their role in the encounter:  S.Chrae B/CMA, Sherrie Mustache, NP, Daughter, [name], and Patient   Time spent on call:  9 min with medical assistant

## 2021-07-29 ENCOUNTER — Encounter: Payer: Medicare Other | Admitting: Internal Medicine

## 2021-08-13 DIAGNOSIS — H04123 Dry eye syndrome of bilateral lacrimal glands: Secondary | ICD-10-CM | POA: Diagnosis not present

## 2021-08-19 ENCOUNTER — Non-Acute Institutional Stay: Payer: Medicare Other | Admitting: Internal Medicine

## 2021-08-19 ENCOUNTER — Other Ambulatory Visit: Payer: Self-pay

## 2021-08-19 ENCOUNTER — Encounter: Payer: Self-pay | Admitting: Internal Medicine

## 2021-08-19 VITALS — BP 152/81 | HR 65 | Temp 97.1°F | Ht 63.5 in | Wt 145.6 lb

## 2021-08-19 DIAGNOSIS — Z952 Presence of prosthetic heart valve: Secondary | ICD-10-CM

## 2021-08-19 DIAGNOSIS — E785 Hyperlipidemia, unspecified: Secondary | ICD-10-CM

## 2021-08-19 DIAGNOSIS — R200 Anesthesia of skin: Secondary | ICD-10-CM | POA: Diagnosis not present

## 2021-08-19 DIAGNOSIS — E038 Other specified hypothyroidism: Secondary | ICD-10-CM | POA: Diagnosis not present

## 2021-08-19 DIAGNOSIS — F419 Anxiety disorder, unspecified: Secondary | ICD-10-CM | POA: Diagnosis not present

## 2021-08-19 DIAGNOSIS — M5030 Other cervical disc degeneration, unspecified cervical region: Secondary | ICD-10-CM

## 2021-08-19 DIAGNOSIS — Z780 Asymptomatic menopausal state: Secondary | ICD-10-CM

## 2021-08-19 DIAGNOSIS — F32A Depression, unspecified: Secondary | ICD-10-CM

## 2021-08-19 DIAGNOSIS — I10 Essential (primary) hypertension: Secondary | ICD-10-CM

## 2021-08-19 MED ORDER — ESCITALOPRAM OXALATE 5 MG PO TABS: 5.0000 mg | ORAL_TABLET | Freq: Every day | ORAL | 3 refills | Status: DC
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
## 2021-08-19 NOTE — Patient Instructions (Signed)
Someone will call you to schedule CAT scan.   Get bone density scan at the same time.

## 2021-08-24 DIAGNOSIS — Z20822 Contact with and (suspected) exposure to covid-19: Secondary | ICD-10-CM | POA: Diagnosis not present

## 2021-09-16 ENCOUNTER — Other Ambulatory Visit: Payer: Medicare Other

## 2021-09-18 ENCOUNTER — Other Ambulatory Visit: Payer: Self-pay | Admitting: Internal Medicine

## 2021-09-28 ENCOUNTER — Ambulatory Visit
Admission: RE | Admit: 2021-09-28 | Discharge: 2021-09-28 | Disposition: A | Discharge status: Home / Self Care | Payer: Medicare Other | Source: Ambulatory Visit | Attending: Internal Medicine | Admitting: Internal Medicine

## 2021-09-28 DIAGNOSIS — M47812 Spondylosis without myelopathy or radiculopathy, cervical region: Secondary | ICD-10-CM | POA: Diagnosis not present

## 2021-09-28 DIAGNOSIS — Z981 Arthrodesis status: Secondary | ICD-10-CM | POA: Diagnosis not present

## 2021-09-28 DIAGNOSIS — R200 Anesthesia of skin: Secondary | ICD-10-CM

## 2021-09-28 DIAGNOSIS — M47813 Spondylosis without myelopathy or radiculopathy, cervicothoracic region: Secondary | ICD-10-CM | POA: Diagnosis not present

## 2021-09-29 ENCOUNTER — Ambulatory Visit: Payer: Medicare Other | Admitting: Internal Medicine

## 2021-10-01 ENCOUNTER — Other Ambulatory Visit: Payer: Self-pay

## 2021-10-01 DIAGNOSIS — I10 Essential (primary) hypertension: Secondary | ICD-10-CM

## 2021-10-01 DIAGNOSIS — E785 Hyperlipidemia, unspecified: Secondary | ICD-10-CM

## 2021-10-01 DIAGNOSIS — E038 Other specified hypothyroidism: Secondary | ICD-10-CM

## 2021-10-05 DIAGNOSIS — E785 Hyperlipidemia, unspecified: Secondary | ICD-10-CM | POA: Diagnosis not present

## 2021-10-05 DIAGNOSIS — E038 Other specified hypothyroidism: Secondary | ICD-10-CM | POA: Diagnosis not present

## 2021-10-05 DIAGNOSIS — I10 Essential (primary) hypertension: Secondary | ICD-10-CM | POA: Diagnosis not present

## 2021-10-06 LAB — COMPLETE METABOLIC PANEL WITH GFR
AG Ratio: 1.8 (calc) (ref 1.0–2.5)
ALT: 13 U/L (ref 6–29)
AST: 16 U/L (ref 10–35)
Albumin: 4.2 g/dL (ref 3.6–5.1)
Alkaline phosphatase (APISO): 61 U/L (ref 37–153)
BUN: 24 mg/dL (ref 7–25)
CO2: 26 mmol/L (ref 20–32)
Calcium: 8.9 mg/dL (ref 8.6–10.4)
Chloride: 104 mmol/L (ref 98–110)
Creat: 0.9 mg/dL (ref 0.60–0.95)
Globulin: 2.4 g/dL (calc) (ref 1.9–3.7)
Glucose, Bld: 90 mg/dL (ref 65–99)
Potassium: 3.8 mmol/L (ref 3.5–5.3)
Sodium: 140 mmol/L (ref 135–146)
Total Bilirubin: 0.6 mg/dL (ref 0.2–1.2)
Total Protein: 6.6 g/dL (ref 6.1–8.1)
eGFR: 61 mL/min/{1.73_m2} (ref >=60)

## 2021-10-06 LAB — LIPID PANEL
Cholesterol: 161 mg/dL (ref <200)
HDL: 61 mg/dL (ref >=50)
LDL Cholesterol (Calc): 79 mg/dL (calc)
Non-HDL Cholesterol (Calc): 100 mg/dL (calc) (ref <130)
Total CHOL/HDL Ratio: 2.6 (calc) (ref <5.0)
Triglycerides: 112 mg/dL (ref <150)

## 2021-10-06 LAB — CBC WITH DIFFERENTIAL/PLATELET
Absolute Monocytes: 982 cells/uL — ABNORMAL HIGH (ref 200–950)
Basophils Absolute: 20 cells/uL (ref 0–200)
Basophils Relative: 0.3 %
Eosinophils Absolute: 228 cells/uL (ref 15–500)
Eosinophils Relative: 3.5 %
HCT: 40.4 % (ref 35.0–45.0)
Hemoglobin: 13.6 g/dL (ref 11.7–15.5)
Lymphs Abs: 1066 cells/uL (ref 850–3900)
MCH: 31.3 pg (ref 27.0–33.0)
MCHC: 33.7 g/dL (ref 32.0–36.0)
MCV: 93.1 fL (ref 80.0–100.0)
MPV: 9.6 fL (ref 7.5–12.5)
Monocytes Relative: 15.1 %
Neutro Abs: 4206 cells/uL (ref 1500–7800)
Neutrophils Relative %: 64.7 %
Platelets: 170 10*3/uL (ref 140–400)
RBC: 4.34 10*6/uL (ref 3.80–5.10)
RDW: 13.1 % (ref 11.0–15.0)
Total Lymphocyte: 16.4 %
WBC: 6.5 10*3/uL (ref 3.8–10.8)

## 2021-10-06 LAB — TSH: TSH: 4.2 mIU/L (ref 0.40–4.50)

## 2021-10-14 ENCOUNTER — Non-Acute Institutional Stay: Payer: Medicare Other | Admitting: Internal Medicine

## 2021-10-14 ENCOUNTER — Other Ambulatory Visit: Payer: Self-pay

## 2021-10-14 ENCOUNTER — Encounter: Payer: Self-pay | Admitting: Internal Medicine

## 2021-10-14 VITALS — BP 170/90 | HR 59 | Temp 97.4°F | Ht 63.5 in | Wt 148.9 lb

## 2021-10-14 DIAGNOSIS — F419 Anxiety disorder, unspecified: Secondary | ICD-10-CM | POA: Diagnosis not present

## 2021-10-14 DIAGNOSIS — K2100 Gastro-esophageal reflux disease with esophagitis, without bleeding: Secondary | ICD-10-CM

## 2021-10-14 DIAGNOSIS — E785 Hyperlipidemia, unspecified: Secondary | ICD-10-CM

## 2021-10-14 DIAGNOSIS — E7800 Pure hypercholesterolemia, unspecified: Secondary | ICD-10-CM

## 2021-10-14 DIAGNOSIS — F32A Depression, unspecified: Secondary | ICD-10-CM | POA: Diagnosis not present

## 2021-10-14 DIAGNOSIS — E038 Other specified hypothyroidism: Secondary | ICD-10-CM | POA: Diagnosis not present

## 2021-10-14 DIAGNOSIS — M5030 Other cervical disc degeneration, unspecified cervical region: Secondary | ICD-10-CM

## 2021-10-14 DIAGNOSIS — I10 Essential (primary) hypertension: Secondary | ICD-10-CM | POA: Diagnosis not present

## 2021-10-14 DIAGNOSIS — Z952 Presence of prosthetic heart valve: Secondary | ICD-10-CM | POA: Diagnosis not present

## 2021-10-14 DIAGNOSIS — R200 Anesthesia of skin: Secondary | ICD-10-CM

## 2021-10-14 MED ORDER — ESCITALOPRAM OXALATE 10 MG PO TABS: 10.0000 mg | ORAL_TABLET | Freq: Every day | ORAL | 3 refills | Status: DC

## 2021-10-14 MED ORDER — AMLODIPINE BESYLATE 5 MG PO TABS: 5.0000 mg | ORAL_TABLET | Freq: Every day | ORAL | 3 refills | Status: DC

## 2021-10-14 NOTE — Progress Notes (Signed)
? ?Location: [place] ?  ?Place of Service:  Clinic (12) ? ?Provider:  ? ?Code Status:  ?Goals of Care:  ? ?  10/14/2021  ?  3:14 PM  ?Advanced Directives  ?Does Patient Have a Medical Advance Directive? No  ?Would patient like information on creating a medical advance directive? No - Patient declined  ? ? ? ?Chief Complaint  ?Patient presents with  ? Medical Management of Chronic Issues  ?  Patient returns to the clinic for her 8 week follow up.   ? Quality Metric Gaps  ?  Dexa  ? ? ?HPI: Patient is a 88 y.o. female seen today for an acute visit for Follow up of her BP and CT scan of neck ?HLD ? history of TAVR in 11/2018 ?GE reflux disease with Barrett's ?Hypertension ?Skin cancer s/p Mohs surgery ?Says she is allergic to Shingrix and not suppose to take Covid Booster ? ?Depression  ?Started on Lexapro and feeling much better ?Still taking Melatonin and Xanax at night ?Has pain in her neck and lower back due to degenerative disease. ?She has seen Dr. Ortencia Kick.  Has not tolerated gabapentin in the past. ?He suggested physical therapy Cannot do MRI due to Anterior plate  in her neck ?  ?Did not tolerate Cymbalta or Neurontin ?Did good with therapy but still c/o Her Right arm getting numbness at night and waking her up ?Pain seems controlled on Tylenol PRN ? ?CT scan did show  ?Advanced cervical spine degeneration with solid arthrodesis at ?C5-6 and facet ankylosis at C2-C4. ?2. Degenerative left foraminal impingement at C3-4 and C4-5. ?3. C6-7 moderate right foraminal narrowing. ? ?Has seen Neurosurgery before Daughter and Patient agree both are not candidate for further work up ? ?Past Medical History:  ?Diagnosis Date  ? Anxiety   ? Asthma   ? Barrett's esophagus   ? Cancer [facility])   ? DJD (degenerative joint disease)   ? GERD (gastroesophageal reflux disease)   ? Goiter   ? Hemorrhoids   ? History of colonoscopy   ? History of shingles   ? face/eye  ? History of subdural hematoma   ? History of  traumatic subdural hematoma   ? Hx of colonic polyps   ? Hyperlipidemia   ? Hypertension   ? IBS (irritable bowel syndrome)   ? Osteoarthritis   ? S/P TAVR (transcatheter aortic valve replacement)   ? 23 mm Edwards Sapien 3 transcatheter heart valve placed via percutaneous right transfemoral approach   ? Sarcoidosis   ? Sicca ([place])   ? ? ?Past Surgical History:  ?Procedure Laterality Date  ? BREAST LUMPECTOMY  1974  ? left  ? CATARACT EXTRACTION W/ INTRAOCULAR LENS  IMPLANT, BILATERAL    ? CRANIOTOMY Right 01/04/2014  ? Procedure: CRANIOTOMY HEMATOMA EVACUATION SUBDURAL;  Surgeon: [name], MD;  Location: [place] [facility];  Service: Neurosurgery;  Laterality: Right;  ? CRANIOTOMY N/A 01/08/2014  ? Procedure: Redo CRANIOTOMY HEMATOMA EVACUATION SUBDURAL RIGHT;  Surgeon: [name], MD;  Location: [place] [facility];  Service: Neurosurgery;  Laterality: N/A;  Redo CRANIOTOMY HEMATOMA EVACUATION SUBDURAL RIGHT  ? CRANIOTOMY Right 01/10/2014  ? Procedure: Redo CRANIOTOMY HEMATOMA EVACUATION SUBDURAL;  Surgeon: [name], MD;  Location: [place] NEUR[facility] OR[facility];  Service: Neurosurgery;  Laterality: Right;  ? DILATION AND CURETTAGE OF UTERUS    ? EYE SURGERY    ? NASAL SINUS SURGERY    ? NECK SURGERY    ? x 2  ?  RIGHT/LEFT HEART CATH AND CORONARY ANGIOGRAPHY N/A 09/27/2018  ? Procedure: RIGHT/LEFT HEART CATH AND CORONARY ANGIOGRAPHY;  Surgeon: Burnell Blanks, MD;  Location: [place] [facility];  Service: Cardiovascular;  Laterality: N/A;  ? TEE WITHOUT CARDIOVERSION N/A 11/28/2018  ? Procedure: TRANSESOPHAGEAL ECHOCARDIOGRAM (TEE);  Surgeon: Sherren Mocha, MD;  Location: [place];  Service: Open Heart Surgery;  Laterality: N/A;  ? August  ? TOTAL THYROIDECTOMY  06-05-08  ? TRANSCATHETER AORTIC VALVE REPLACEMENT, TRANSFEMORAL N/A 11/28/2018  ? Procedure: TRANSCATHETER AORTIC VALVE REPLACEMENT, TRANSFEMORAL;  Surgeon: Sherren Mocha, MD;  Location: [place];  Service: Open Heart Surgery;  Laterality:  N/A;  ? ? ?Allergies  ?Allergen Reactions  ? Losartan Potassium Other (See Comments)  ? Zoster Vaccine Live Other (See Comments)  ? ? ?Outpatient Encounter Medications as of 10/14/2021  ?Medication Sig  ? acetaminophen (TYLENOL) 650 MG CR tablet Take 650 mg by mouth 2 (two) times daily.  ? ALPRAZolam (XANAX) 0.5 MG tablet TAKE 2 TABLETS(1 MG) BY MOUTH AT BEDTIME  ? amLODipine (NORVASC) 5 MG tablet Take 1 tablet (5 mg total) by mouth daily.  ? amoxicillin (AMOXIL) 500 MG tablet TAKE 4 TABSULES ONE HOUR PRIOR TO ALL DENTAL VISITS  ? aspirin 81 MG chewable tablet Chew 1 tablet (81 mg total) by mouth daily.  ? Bacillus Coagulans-Inulin (PROBIOTIC-PREBIOTIC PO) Take 1 capsule by mouth daily at 6 (six) AM.  ? carvedilol (COREG) 6.25 MG tablet TAKE 1 TABLET(6.25 MG) BY MOUTH TWICE DAILY WITH A MEAL  ? cholecalciferol (VITAMIN D3) 25 MCG (1000 UNIT) tablet Take 1,000 Units by mouth at bedtime.  ? cyanocobalamin 100 MCG tablet Take 100 mcg by mouth daily.  ? cycloSPORINE (RESTASIS) 0.05 % ophthalmic emulsion Place 1 drop into both eyes every 12 (twelve) hours.  ? escitalopram (LEXAPRO) 10 MG tablet Take 1 tablet (10 mg total) by mouth daily.  ? Homeopathic Products (LEG CRAMP COMPLEX PO) Take 1 capsule by mouth daily.  ? levothyroxine (SYNTHROID) 75 MCG tablet TAKE 1 TABLET(75 MCG) BY MOUTH DAILY  ? Multiple Vitamins-Minerals (B COMPLEX PLUS VITAMIN C PO) Take 1 capsule by mouth daily at 6 (six) AM.  ? rosuvastatin (CRESTOR) 5 MG tablet Take 1 tablet (5 mg total) by mouth daily.  ? senna-docusate (SENOKOT-S) 8.6-50 MG tablet Take 3 tablets by mouth at bedtime.   ? vitamin E 180 MG (400 UNITS) capsule Take 400 Units by mouth daily.  ? Zinc 50 MG CAPS Take by mouth.  ? [DISCONTINUED] amLODipine (NORVASC) 2.5 MG tablet Take 1 tablet (2.5 mg total) by mouth daily.  ? [DISCONTINUED] escitalopram (LEXAPRO) 5 MG tablet Take 1 tablet (5 mg total) by mouth daily.  ? [DISCONTINUED] Melatonin 10 MG CAPS Take 1 capsule by mouth as  needed.  ? ?No facility-administered encounter medications on file as of 10/14/2021.  ? ? ?Review of Systems:  ?Review of Systems  ?Constitutional:  Negative for activity change and appetite change.  ?HENT: Negative.    ?Respiratory:  Negative for cough and shortness of breath.   ?Cardiovascular:  Negative for leg swelling.  ?Gastrointestinal:  Negative for constipation.  ?Genitourinary: Negative.   ?Musculoskeletal:  Positive for neck pain. Negative for arthralgias, gait problem and myalgias.  ?Skin: Negative.   ?Neurological:  Positive for numbness. Negative for dizziness and weakness.  ?Psychiatric/Behavioral:  Positive for dysphoric mood and sleep disturbance. Negative for confusion. The patient is nervous/anxious.   ? ?Health Maintenance  ?Topic Date Due  ? DEXA SCAN  Never done  ? COVID-19 Vaccine (1) 09/05/2027 (Originally 08/25/1931)  ? INFLUENZA VACCINE  10/24/2027 (Originally 02/23/2021)  ? Zoster Vaccines- Shingrix (1 of 2) 11/18/2027 (Originally 02/23/1950)  ? TETANUS/TDAP  12/19/2023  ? Pneumonia Vaccine 10+ Years old  Completed  ? HPV VACCINES  Aged Out  ? ? ?Physical Exam: ?Vitals:  ? 10/14/21 1513  ?BP: (!) 170/90  ?Pulse: (!) 59  ?Temp: (!) 97.4 ?F (36.3 ?C)  ?SpO2: 97%  ?Weight: 148 lb 14.4 oz (67.5 kg)  ?Height: 5' 3.5" (1.613 m)  ? ?Body mass index is 25.96 kg/m?Marland Kitchen ?Physical Exam ?Vitals reviewed.  ?Constitutional:   ?   Appearance: Normal appearance.  ?HENT:  ?   Head: Normocephalic.  ?   Nose: Nose normal.  ?   Mouth/Throat:  ?   Mouth: Mucous membranes are moist.  ?   Pharynx: Oropharynx is clear.  ?Eyes:  ?   Pupils: Pupils are equal, round, and reactive to light.  ?Cardiovascular:  ?   Rate and Rhythm: Normal rate and regular rhythm.  ?   Pulses: Normal pulses.  ?   Heart sounds: Normal heart sounds. No murmur heard. ?Pulmonary:  ?   Effort: Pulmonary effort is normal.  ?   Breath sounds: Normal breath sounds.  ?Abdominal:  ?   General: Abdomen is flat. Bowel sounds are normal.  ?   Palpations:  Abdomen is soft.  ?Musculoskeletal:     ?   General: No swelling.  ?   Cervical back: Neck supple.  ?Skin: ?   General: Skin is warm.  ?Neurological:  ?   General: No focal deficit present.  ?   Mental Status: She i

## 2021-10-14 NOTE — Patient Instructions (Signed)
Take 5 mg of Amlodipine in the morning from tomorrow ?Do not take Melatonin ?Do not reduce alprazolam for now  ?Take few BP readings and Bring it for your visit next time ? ?

## 2021-10-21 DIAGNOSIS — Z20822 Contact with and (suspected) exposure to covid-19: Secondary | ICD-10-CM | POA: Diagnosis not present

## 2021-11-03 ENCOUNTER — Other Ambulatory Visit: Payer: Self-pay | Admitting: Internal Medicine

## 2021-11-12 ENCOUNTER — Other Ambulatory Visit: Payer: Self-pay | Admitting: Nurse Practitioner

## 2021-11-13 DIAGNOSIS — Z20822 Contact with and (suspected) exposure to covid-19: Secondary | ICD-10-CM | POA: Diagnosis not present

## 2021-11-23 DIAGNOSIS — Z20822 Contact with and (suspected) exposure to covid-19: Secondary | ICD-10-CM | POA: Diagnosis not present

## 2021-12-03 DIAGNOSIS — H04123 Dry eye syndrome of bilateral lacrimal glands: Secondary | ICD-10-CM | POA: Diagnosis not present

## 2021-12-14 ENCOUNTER — Other Ambulatory Visit: Payer: Self-pay | Admitting: Internal Medicine

## 2021-12-23 ENCOUNTER — Encounter: Payer: Self-pay | Admitting: Internal Medicine

## 2021-12-23 ENCOUNTER — Non-Acute Institutional Stay: Payer: Medicare Other | Admitting: Internal Medicine

## 2021-12-23 VITALS — BP 144/69 | HR 55 | Temp 96.8°F | Ht 63.5 in | Wt 150.5 lb

## 2021-12-23 DIAGNOSIS — R600 Localized edema: Secondary | ICD-10-CM

## 2021-12-23 DIAGNOSIS — F419 Anxiety disorder, unspecified: Secondary | ICD-10-CM

## 2021-12-23 DIAGNOSIS — E785 Hyperlipidemia, unspecified: Secondary | ICD-10-CM | POA: Diagnosis not present

## 2021-12-23 DIAGNOSIS — I10 Essential (primary) hypertension: Secondary | ICD-10-CM | POA: Diagnosis not present

## 2021-12-23 DIAGNOSIS — Z952 Presence of prosthetic heart valve: Secondary | ICD-10-CM

## 2021-12-23 DIAGNOSIS — F32A Depression, unspecified: Secondary | ICD-10-CM

## 2021-12-23 MED ORDER — LEVOTHYROXINE SODIUM 75 MCG PO TABS: ORAL_TABLET | ORAL | 1 refills | Status: DC

## 2021-12-23 NOTE — Telephone Encounter (Signed)
Patient requested refill.  Pended and sent to Dr. Lyndel Safe for approval.

## 2021-12-23 NOTE — Progress Notes (Signed)
Location: [facility] (12)  Provider:   Code Status:  Goals of Care:     10/14/2021    3:14 PM  Advanced Directives  Does Patient Have a Medical Advance Directive? No  Would patient like information on creating a medical advance directive? No - Patient declined     Chief Complaint  Patient presents with   Medical Management of Chronic Issues    Patient returns to the clinic for follow up in her blood pressure.    HPI: Patient is a 88 y.o. female seen today for an acute visit for Follow up of her BP And Depression  history of TAVR in 11/2018 GE reflux disease with Barrett's Hypertension Skin cancer s/p Mohs surgery Says she is allergic to Shingrix and not suppose to take Covid Booster  Depression  Has pain in her neck and lower back due to degenerative disease. She has seen Dr. Ortencia Kick.  Has not tolerated gabapentin in the past. He suggested physical therapy Cannot do MRI due to Anterior plate  in her neck  Did not tolerate Cymbalta or Neurontin  CT scan did show  Advanced cervical spine degeneration with solid arthrodesis at C5-6 and facet ankylosis at C2-C4.With Foraminal Narrowing Has seen Neurosurgery before Daughter and Patient agree both are not candidate for further work up   Follow up of  HTN increased her Norvasc to 5 mg BP better today  Also stopped her melatonin But noticed swelling in her Legs  Has not increased her Lexapro yet Wants to make sure it is ok to take it now Past Medical History:  Diagnosis Date   Anxiety    Asthma    Barrett's esophagus    Cancer ([place])    DJD (degenerative joint disease)    GERD (gastroesophageal reflux disease)    Goiter    Hemorrhoids    History of colonoscopy    History of shingles    face/eye   History of subdural hematoma    History of traumatic subdural hematoma    Hx of colonic polyps    Hyperlipidemia    Hypertension    IBS (irritable bowel syndrome)     Osteoarthritis    S/P TAVR (transcatheter aortic valve replacement)    23 mm Edwards Sapien 3 transcatheter heart valve placed via percutaneous right transfemoral approach    Sarcoidosis    Sicca ([place])     Past Surgical History:  Procedure Laterality Date   BREAST LUMPECTOMY  1974   left   CATARACT EXTRACTION W/ INTRAOCULAR LENS  IMPLANT, BILATERAL     CRANIOTOMY Right 01/04/2014   Procedure: CRANIOTOMY HEMATOMA EVACUATION SUBDURAL;  Surgeon: [name], MD;  Location: [facility];  Service: Neurosurgery;  Laterality: Right;   CRANIOTOMY N/A 01/08/2014   Procedure: Redo CRANIOTOMY HEMATOMA EVACUATION SUBDURAL RIGHT;  Surgeon: [name], MD;  Location: [place] [facility];  Service: Neurosurgery;  Laterality: N/A;  Redo CRANIOTOMY HEMATOMA EVACUATION SUBDURAL RIGHT   CRANIOTOMY Right 01/10/2014   Procedure: Redo CRANIOTOMY HEMATOMA EVACUATION SUBDURAL;  Surgeon: [name], MD;  Location: [place] [facility];  Service: Neurosurgery;  Laterality: Right;   DILATION AND CURETTAGE OF UTERUS     EYE SURGERY     NASAL SINUS SURGERY     NECK SURGERY     x 2   RIGHT/LEFT HEART CATH AND CORONARY ANGIOGRAPHY N/A 09/27/2018   Procedure: RIGHT/LEFT HEART CATH AND CORONARY ANGIOGRAPHY;  Surgeon: Angelena Form,  Annita Brod, MD;  Location: [place] [facility];  Service: Cardiovascular;  Laterality: N/A;   TEE WITHOUT CARDIOVERSION N/A 11/28/2018   Procedure: TRANSESOPHAGEAL ECHOCARDIOGRAM (TEE);  Surgeon: Sherren Mocha, MD;  Location: [place];  Service: Open Heart Surgery;  Laterality: N/A;   TONSILLECTOMY AND ADENOIDECTOMY  1976   TOTAL THYROIDECTOMY  06-05-08   TRANSCATHETER AORTIC VALVE REPLACEMENT, TRANSFEMORAL N/A 11/28/2018   Procedure: TRANSCATHETER AORTIC VALVE REPLACEMENT, TRANSFEMORAL;  Surgeon: Sherren Mocha, MD;  Location: [place];  Service: Open Heart Surgery;  Laterality: N/A;    Allergies  Allergen Reactions   Losartan Potassium Other (See Comments)   Zoster Vaccine Live Other (See Comments)     Outpatient Encounter Medications as of 12/23/2021  Medication Sig   acetaminophen (TYLENOL) 650 MG CR tablet Take 650 mg by mouth 2 (two) times daily.   ALPRAZolam (XANAX) 0.5 MG tablet TAKE 2 TABLETS(1 MG) BY MOUTH AT BEDTIME   amLODipine (NORVASC) 5 MG tablet Take 1 tablet (5 mg total) by mouth daily.   amoxicillin (AMOXIL) 500 MG tablet TAKE 4 TABSULES ONE HOUR PRIOR TO ALL DENTAL VISITS   aspirin 81 MG chewable tablet Chew 1 tablet (81 mg total) by mouth daily.   Bacillus Coagulans-Inulin (PROBIOTIC-PREBIOTIC PO) Take 1 capsule by mouth daily at 6 (six) AM.   carvedilol (COREG) 6.25 MG tablet TAKE 1 TABLET(6.25 MG) BY MOUTH TWICE DAILY WITH A MEAL   cholecalciferol (VITAMIN D3) 25 MCG (1000 UNIT) tablet Take 1,000 Units by mouth at bedtime.   cyanocobalamin 100 MCG tablet Take 100 mcg by mouth daily.   cycloSPORINE (RESTASIS) 0.05 % ophthalmic emulsion Place 1 drop into both eyes every 12 (twelve) hours.   escitalopram (LEXAPRO) 10 MG tablet Take 1 tablet (10 mg total) by mouth daily.   Homeopathic Products (LEG CRAMP COMPLEX PO) Take 1 capsule by mouth daily.   levothyroxine (SYNTHROID) 75 MCG tablet TAKE 1 TABLET(75 MCG) BY MOUTH DAILY   Multiple Vitamins-Minerals (B COMPLEX PLUS VITAMIN C PO) Take 1 capsule by mouth daily at 6 (six) AM.   rosuvastatin (CRESTOR) 5 MG tablet Take 1 tablet (5 mg total) by mouth daily.   senna-docusate (SENOKOT-S) 8.6-50 MG tablet Take 3 tablets by mouth at bedtime.    vitamin E 180 MG (400 UNITS) capsule Take 400 Units by mouth daily.   Zinc 50 MG CAPS Take by mouth.   No facility-administered encounter medications on file as of 12/23/2021.    Review of Systems:  Review of Systems  Constitutional:  Negative for activity change and appetite change.  HENT: Negative.    Respiratory:  Negative for cough and shortness of breath.   Cardiovascular:  Positive for leg swelling.  Gastrointestinal:  Negative for constipation.  Genitourinary: Negative.    Musculoskeletal:  Negative for arthralgias, gait problem and myalgias.  Skin: Negative.   Neurological:  Positive for numbness. Negative for dizziness and weakness.  Psychiatric/Behavioral:  Negative for confusion, dysphoric mood and sleep disturbance. The patient is nervous/anxious.    Health Maintenance  Topic Date Due   DEXA SCAN  Never done   COVID-19 Vaccine (1) 09/05/2027 (Originally 08/25/1931)   INFLUENZA VACCINE  10/24/2027 (Originally 02/23/2022)   Zoster Vaccines- Shingrix (1 of 2) 11/18/2027 (Originally 02/23/1950)   TETANUS/TDAP  12/19/2023   Pneumonia Vaccine 36+ Years old  Completed   HPV VACCINES  Aged Out    Physical Exam: Vitals:   12/23/21 1420  BP: (!) 144/69  Pulse: (!) 55  Temp: (!) 96.8 F (  36 C)  SpO2: 95%  Weight: 150 lb 8 oz (68.3 kg)  Height: 5' 3.5" (1.613 m)   Body mass index is 26.24 kg/m. Physical Exam Vitals reviewed.  Constitutional:      Appearance: Normal appearance.  HENT:     Head: Normocephalic.     Nose: Nose normal.     Mouth/Throat:     Mouth: Mucous membranes are moist.     Pharynx: Oropharynx is clear.  Eyes:     Pupils: Pupils are equal, round, and reactive to light.  Cardiovascular:     Rate and Rhythm: Normal rate and regular rhythm.     Pulses: Normal pulses.     Heart sounds: Murmur heard.  Pulmonary:     Effort: Pulmonary effort is normal.     Breath sounds: Normal breath sounds.  Abdominal:     General: Abdomen is flat. Bowel sounds are normal.     Palpations: Abdomen is soft.  Musculoskeletal:        General: Swelling present.     Cervical back: Neck supple.  Skin:    General: Skin is warm.  Neurological:     General: No focal deficit present.     Mental Status: She is alert and oriented to person, place, and time.  Psychiatric:        Mood and Affect: Mood normal.        Thought Content: Thought content normal.    Labs reviewed: Basic Metabolic Panel: Recent Labs    03/25/21 0902 10/05/21 0845  NA  140 140  K 3.9 3.8  CL 104 104  CO2 25 26  GLUCOSE 89 90  BUN 23 24  CREATININE 0.92 0.90  CALCIUM 8.9 8.9  TSH  --  4.20   Liver Function Tests: Recent Labs    03/25/21 0902 10/05/21 0845  AST 17 16  ALT 13 13  BILITOT 0.7 0.6  PROT 6.8 6.6   No results for input(s): LIPASE, AMYLASE in the last 8760 hours. No results for input(s): AMMONIA in the last 8760 hours. CBC: Recent Labs    03/25/21 0902 10/05/21 0845  WBC CANCELED 6.5  NEUTROABS  --  4,206  HGB  --  13.6  HCT  --  40.4  MCV  --  93.1  PLT  --  170   Lipid Panel: Recent Labs    03/25/21 0902 10/05/21 0845  CHOL 176 161  HDL 62 61  LDLCALC 91 79  TRIG 136 112  CHOLHDL 2.8 2.6   Lab Results  Component Value Date   HGBA1C 5.4 11/23/2018    Procedures since last visit: No results found.  Assessment/Plan 1. Essential hypertension BP better on Norvasc  2. Anxiety and depression Has not changed her Lexapro  Will start in few days Continues to use her Xanax 3. Bilateral leg edema Seems like new problem Can be due to Norvasc Will try to elevated her legs and cut back her salt If Persists have to discontinue her Norvasc S/P TAVR (transcatheter aortic valve replacement) Dental Prophylaxis Right arm numbness Most likely due to Degenerative disease in Neck Not candidate for  surgery   Degenerative disc disease, cervical Pain control with tylenol  Hyperlipidemia, unspecified hyperlipidemia type On crestor   hypothyroidism Tsh normal  Labs/tests ordered:  * No order type specified * Next appt:  03/31/2022

## 2021-12-29 ENCOUNTER — Telehealth: Payer: Self-pay

## 2021-12-29 NOTE — Telephone Encounter (Signed)
Left voicemail for patient to all back and schedule their Medicare AWV with Yvonna Alanis, NP at [facility].

## 2022-01-06 ENCOUNTER — Telehealth: Payer: Self-pay | Admitting: Internal Medicine

## 2022-01-06 NOTE — Telephone Encounter (Signed)
   Pre-operative Risk Assessment    Patient Name: Brenda [name]  DOB: Feb 27, 1931 MRN: [id number]      Request for Surgical Clearance    Procedure:   cleaning   Date of Surgery:  Clearance 01/06/22                                 Surgeon:  Dr Jenness Corner  Surgeon's Group or Practice Name:  [place] Phone number:  [phone] Fax number:      Type of Clearance Requested:   - Medical    Type of Anesthesia:  None    Additional requests/questions:      SignedMilbert Coulter   01/06/2022, 3:33 PM

## 2022-01-06 NOTE — Telephone Encounter (Signed)
I spoke with the dental office and advised that does require SBE prior to all dental procedures. Patient has a prescription for amoxicillin with refills.  Emmaline Life, NP-C    01/06/2022, 4:30 PM [place] [address] Office [phone] Fax [phone]

## 2022-01-07 ENCOUNTER — Encounter: Payer: Self-pay | Admitting: Internal Medicine

## 2022-01-10 ENCOUNTER — Other Ambulatory Visit: Payer: Self-pay | Admitting: Internal Medicine

## 2022-02-09 ENCOUNTER — Other Ambulatory Visit: Payer: Self-pay | Admitting: Internal Medicine

## 2022-02-09 NOTE — Telephone Encounter (Signed)
Rx last refill (11/12/21), 60/2 refill. No treatment agreement on file. Upcoming appointment on (03/31/22).   Please advise.

## 2022-02-11 ENCOUNTER — Other Ambulatory Visit: Payer: Self-pay

## 2022-02-11 NOTE — Telephone Encounter (Signed)
Called patient daughter and voicemail was left to return call if she has any concerns.

## 2022-02-11 NOTE — Telephone Encounter (Signed)
Patient daughter Lynelle Smoke called and states that pharmacy has been trying to contact the office for refill on patient medication Alprazolam. I don't see anything but I let her know that I would send refill request to PCP Virgie Dad, MD . Patient doesn't have Non Opioid Contract on file. However upcoming appointment 03/31/2022 has Sign Contract added to appointment notes.

## 2022-02-11 NOTE — Telephone Encounter (Signed)
Medication refill by PCP in another encounter Virgie Dad, MD for 60 tablets, and zero refills.

## 2022-03-08 ENCOUNTER — Other Ambulatory Visit: Payer: Self-pay | Admitting: Internal Medicine

## 2022-03-10 ENCOUNTER — Non-Acute Institutional Stay: Payer: Medicare Other | Admitting: Internal Medicine

## 2022-03-10 ENCOUNTER — Encounter: Payer: Self-pay | Admitting: Internal Medicine

## 2022-03-10 VITALS — BP 169/90 | HR 81 | Temp 97.0°F | Resp 16 | Ht 63.5 in | Wt 143.6 lb

## 2022-03-10 DIAGNOSIS — M545 Low back pain, unspecified: Secondary | ICD-10-CM | POA: Diagnosis not present

## 2022-03-10 DIAGNOSIS — R350 Frequency of micturition: Secondary | ICD-10-CM

## 2022-03-10 DIAGNOSIS — G8929 Other chronic pain: Secondary | ICD-10-CM | POA: Diagnosis not present

## 2022-03-10 DIAGNOSIS — F419 Anxiety disorder, unspecified: Secondary | ICD-10-CM | POA: Diagnosis not present

## 2022-03-10 DIAGNOSIS — R197 Diarrhea, unspecified: Secondary | ICD-10-CM | POA: Diagnosis not present

## 2022-03-10 DIAGNOSIS — F32A Depression, unspecified: Secondary | ICD-10-CM | POA: Diagnosis not present

## 2022-03-10 DIAGNOSIS — M542 Cervicalgia: Secondary | ICD-10-CM | POA: Diagnosis not present

## 2022-03-10 DIAGNOSIS — I10 Essential (primary) hypertension: Secondary | ICD-10-CM | POA: Diagnosis not present

## 2022-03-10 MED ORDER — ALPRAZOLAM 0.5 MG PO TABS: 0.5000 mg | ORAL_TABLET | Freq: Two times a day (BID) | ORAL | 0 refills | Status: DC

## 2022-03-11 ENCOUNTER — Other Ambulatory Visit: Payer: Self-pay | Admitting: Internal Medicine

## 2022-03-11 ENCOUNTER — Other Ambulatory Visit: Payer: Medicare Other

## 2022-03-11 DIAGNOSIS — D699 Hemorrhagic condition, unspecified: Secondary | ICD-10-CM | POA: Diagnosis not present

## 2022-03-11 DIAGNOSIS — E785 Hyperlipidemia, unspecified: Secondary | ICD-10-CM | POA: Diagnosis not present

## 2022-03-11 DIAGNOSIS — I10 Essential (primary) hypertension: Secondary | ICD-10-CM | POA: Diagnosis not present

## 2022-03-11 DIAGNOSIS — D509 Iron deficiency anemia, unspecified: Secondary | ICD-10-CM | POA: Diagnosis not present

## 2022-03-11 DIAGNOSIS — E119 Type 2 diabetes mellitus without complications: Secondary | ICD-10-CM | POA: Diagnosis not present

## 2022-03-11 DIAGNOSIS — R300 Dysuria: Secondary | ICD-10-CM | POA: Diagnosis not present

## 2022-03-11 NOTE — Progress Notes (Signed)
Location: [facility] (12)  Provider:   Code Status:  Goals of Care:     03/10/2022    3:41 PM  Advanced Directives  Does Patient Have a Medical Advance Directive? Yes  Type of Paramedic of [place];Living will;Out of facility DNR (pink MOST or yellow form)  Does patient want to make changes to medical advance directive? No - Patient declined  Copy of [place] in Chart? No - copy requested     Chief Complaint  Patient presents with   Acute Visit    Patient complains of lower back pain/possible UTI. Patient symptoms are urinary frequency. Patient denies Dysuria, Hematuria, and Urine Odor.     HPI: Patient is a 88 y.o. female seen today for an acute visit for Urinary Frequency, Diarrhea and Anxiety  Urinary frequency No Fever or Dysuria Does have Lower abdominal discomfort  Low Back pain Chronic Diarrhea Not sure as she takes Senna t night and then get loose movements in the morning No Blood in stool No diarrhea at night Does have low abdominal discomfort Anxiety Taking extra Xanax and now ran out of it  stopped her Lexapro as thought it was giving her side effects Also taking extra Melatonin at night 10 mg  Here with her daughter Other issues history of TAVR in 11/2018 GE reflux disease with Barrett's Hypertension Skin cancer s/p Mohs surgery Says she is allergic to Shingrix and not suppose to take Covid Booster  Depression  Has pain in her neck and lower back due to degenerative disease. She has seen Dr. Ortencia Kick.  Has not tolerated gabapentin in the past. He suggested physical therapy Cannot do MRI due to Anterior plate  in her neck  Did not tolerate Cymbalta or Neurontin  CT scan did show  Advanced cervical spine degeneration with solid arthrodesis at C5-6 and facet ankylosis at C2-C4.With Foraminal Narrowing Has seen Neurosurgery before Daughter and Patient agree both are  not candidate for further work up  Past Medical History:  Diagnosis Date   Anxiety    Asthma    Barrett's esophagus    Cancer ([place])    DJD (degenerative joint disease)    GERD (gastroesophageal reflux disease)    Goiter    Hemorrhoids    History of colonoscopy    History of shingles    face/eye   History of subdural hematoma    History of traumatic subdural hematoma    Hx of colonic polyps    Hyperlipidemia    Hypertension    IBS (irritable bowel syndrome)    Osteoarthritis    S/P TAVR (transcatheter aortic valve replacement)    23 mm Edwards Sapien 3 transcatheter heart valve placed via percutaneous right transfemoral approach    Sarcoidosis    Sicca ([place])     Past Surgical History:  Procedure Laterality Date   BREAST LUMPECTOMY  1974   left   CATARACT EXTRACTION W/ INTRAOCULAR LENS  IMPLANT, BILATERAL     CRANIOTOMY Right 01/04/2014   Procedure: CRANIOTOMY HEMATOMA EVACUATION SUBDURAL;  Surgeon: [name], MD;  Location: [facility];  Service: Neurosurgery;  Laterality: Right;   CRANIOTOMY N/A 01/08/2014   Procedure: Redo CRANIOTOMY HEMATOMA EVACUATION SUBDURAL RIGHT;  Surgeon: [name], MD;  Location: [place] [facility];  Service: Neurosurgery;  Laterality: N/A;  Redo CRANIOTOMY HEMATOMA EVACUATION SUBDURAL RIGHT   CRANIOTOMY Right 01/10/2014   Procedure: Redo CRANIOTOMY HEMATOMA  EVACUATION SUBDURAL;  Surgeon: [name], MD;  Location: [place] [facility];  Service: Neurosurgery;  Laterality: Right;   DILATION AND CURETTAGE OF UTERUS     EYE SURGERY     NASAL SINUS SURGERY     NECK SURGERY     x 2   RIGHT/LEFT HEART CATH AND CORONARY ANGIOGRAPHY N/A 09/27/2018   Procedure: RIGHT/LEFT HEART CATH AND CORONARY ANGIOGRAPHY;  Surgeon: Burnell Blanks, MD;  Location: [place] [facility];  Service: Cardiovascular;  Laterality: N/A;   TEE WITHOUT CARDIOVERSION N/A 11/28/2018   Procedure: TRANSESOPHAGEAL ECHOCARDIOGRAM (TEE);  Surgeon: Sherren Mocha, MD;  Location:  [place];  Service: Open Heart Surgery;  Laterality: N/A;   TONSILLECTOMY AND ADENOIDECTOMY  1976   TOTAL THYROIDECTOMY  06-05-08   TRANSCATHETER AORTIC VALVE REPLACEMENT, TRANSFEMORAL N/A 11/28/2018   Procedure: TRANSCATHETER AORTIC VALVE REPLACEMENT, TRANSFEMORAL;  Surgeon: Sherren Mocha, MD;  Location: [place];  Service: Open Heart Surgery;  Laterality: N/A;    Allergies  Allergen Reactions   Losartan Potassium Other (See Comments)   Zoster Vaccine Live Other (See Comments)    Outpatient Encounter Medications as of 03/10/2022  Medication Sig   acetaminophen (TYLENOL) 650 MG CR tablet Take 650 mg by mouth 2 (two) times daily.   amLODipine (NORVASC) 5 MG tablet TAKE 1 TABLET(5 MG) BY MOUTH DAILY   amoxicillin (AMOXIL) 500 MG tablet TAKE 4 TABSULES ONE HOUR PRIOR TO ALL DENTAL VISITS   aspirin 81 MG chewable tablet Chew 1 tablet (81 mg total) by mouth daily.   Bacillus Coagulans-Inulin (PROBIOTIC-PREBIOTIC PO) Take 1 capsule by mouth daily at 6 (six) AM.   carvedilol (COREG) 6.25 MG tablet Take 6.25 mg by mouth 2 (two) times daily with a meal. SEND TO [facility]   cholecalciferol (VITAMIN D3) 25 MCG (1000 UNIT) tablet Take 1,000 Units by mouth at bedtime.   cyanocobalamin 100 MCG tablet Take 100 mcg by mouth daily.   cycloSPORINE (RESTASIS) 0.05 % ophthalmic emulsion Place 1 drop into both eyes every 12 (twelve) hours.   Homeopathic Products (LEG CRAMP COMPLEX PO) Take 1 capsule by mouth daily.   levothyroxine (SYNTHROID) 75 MCG tablet TAKE 1 TABLET(75 MCG) BY MOUTH DAILY   Multiple Vitamins-Minerals (B COMPLEX PLUS VITAMIN C PO) Take 1 capsule by mouth daily at 6 (six) AM.   omeprazole (PRILOSEC) 20 MG capsule Take 20 mg by mouth daily.   rosuvastatin (CRESTOR) 5 MG tablet Take 1 tablet (5 mg total) by mouth daily.   senna-docusate (SENOKOT-S) 8.6-50 MG tablet Take 3 tablets by mouth at bedtime.    vitamin E 180 MG (400 UNITS) capsule Take 400 Units by mouth daily.   Zinc 50 MG CAPS Take by  mouth.   [DISCONTINUED] ALPRAZolam (XANAX) 0.5 MG tablet TAKE 2 TABLETS(1 MG) BY MOUTH AT BEDTIME (Patient taking differently: Take 0.5 mg by mouth 2 (two) times daily. Take one tablet in the morning and 2 tablets in Evening)   ALPRAZolam (XANAX) 0.5 MG tablet Take 1 tablet (0.5 mg total) by mouth 2 (two) times daily. Take one tablet in the morning and 2 tablets in Evening   [DISCONTINUED] carvedilol (COREG) 6.25 MG tablet TAKE 1 TABLET(6.25 MG) BY MOUTH TWICE DAILY WITH A MEAL (Patient taking differently: SEND TO [facility])   [DISCONTINUED] escitalopram (LEXAPRO) 10 MG tablet Take 1 tablet (10 mg total) by mouth daily. (Patient not taking: Reported on 03/10/2022)   No facility-administered encounter medications on file as of 03/10/2022.    Review of Systems:  Review of Systems  Constitutional:  Negative for activity change and appetite change.  HENT: Negative.    Respiratory:  Negative for cough and shortness of breath.   Cardiovascular:  Negative for leg swelling.  Gastrointestinal:  Positive for abdominal pain and diarrhea. Negative for constipation.  Genitourinary:  Positive for frequency.  Musculoskeletal:  Negative for arthralgias, gait problem and myalgias.  Skin: Negative.   Neurological:  Negative for dizziness and weakness.  Psychiatric/Behavioral:  Positive for sleep disturbance. Negative for confusion and dysphoric mood. The patient is nervous/anxious.     Health Maintenance  Topic Date Due   DEXA SCAN  Never done   COVID-19 Vaccine (1) 09/05/2027 (Originally 02/22/1936)   INFLUENZA VACCINE  10/24/2027 (Originally 02/23/2022)   Zoster Vaccines- Shingrix (1 of 2) 11/18/2027 (Originally 02/23/1950)   TETANUS/TDAP  12/19/2023   Pneumonia Vaccine 35+ Years old  Completed   HPV VACCINES  Aged Out    Physical Exam: Vitals:   03/10/22 1539 03/10/22 1654  BP: (!) 165/111 (!) 169/90  Pulse: 81   Resp: 16   Temp: (!) 97 F (36.1 C)   SpO2: 96%   Weight: 143 lb 9.6 oz (65.1 kg)    Height: 5' 3.5" (1.613 m)    Body mass index is 25.04 kg/m. Physical Exam Vitals reviewed.  Constitutional:      Appearance: Normal appearance.  HENT:     Head: Normocephalic.     Nose: Nose normal.     Mouth/Throat:     Mouth: Mucous membranes are moist.     Pharynx: Oropharynx is clear.  Eyes:     Pupils: Pupils are equal, round, and reactive to light.  Cardiovascular:     Rate and Rhythm: Normal rate and regular rhythm.     Pulses: Normal pulses.     Heart sounds: Murmur heard.  Pulmonary:     Effort: Pulmonary effort is normal.     Breath sounds: Normal breath sounds.  Abdominal:     General: Abdomen is flat. Bowel sounds are normal.     Palpations: Abdomen is soft.  Musculoskeletal:     Cervical back: Neck supple.     Comments: Mild swelling Bilateral Right more then left  Skin:    General: Skin is warm.  Neurological:     General: No focal deficit present.     Mental Status: She is alert and oriented to person, place, and time.  Psychiatric:        Mood and Affect: Mood normal.        Thought Content: Thought content normal.     Labs reviewed: Basic Metabolic Panel: Recent Labs    03/25/21 0902 10/05/21 0845  NA 140 140  K 3.9 3.8  CL 104 104  CO2 25 26  GLUCOSE 89 90  BUN 23 24  CREATININE 0.92 0.90  CALCIUM 8.9 8.9  TSH  --  4.20   Liver Function Tests: Recent Labs    03/25/21 0902 10/05/21 0845  AST 17 16  ALT 13 13  BILITOT 0.7 0.6  PROT 6.8 6.6   No results for input(s): "LIPASE", "AMYLASE" in the last 8760 hours. No results for input(s): "AMMONIA" in the last 8760 hours. CBC: Recent Labs    03/25/21 0902 10/05/21 0845  WBC CANCELED 6.5  NEUTROABS  --  4,206  HGB  --  13.6  HCT  --  40.4  MCV  --  93.1  PLT  --  170   Lipid Panel: Recent  Labs    03/25/21 0902 10/05/21 0845  CHOL 176 161  HDL 62 61  LDLCALC 91 79  TRIG 136 112  CHOLHDL 2.8 2.6   Lab Results  Component Value Date   HGBA1C 5.4 11/23/2018     Procedures since last visit: No results found.  Assessment/Plan 1. Urinary frequency  - Urine Culture; Future - Urinalysis with Reflex Microscopic; Future - Urinalysis with Reflex Microscopic - Urine Culture  2. Anxiety and depression Did not tolerate Lexapro Does not want to try anything else Will renew Xanax and add one tablet in the morning also  3. Essential hypertension BP elevated Taking melatonin 10 mg at night D/w daughter to stop that  Has follow up with me in 2 weeks  4. Diarrhea, unspecified type Seemed related to Anxiety/Taking senna at night Will let me know next visit Testing if gets worse  5. Chronic bilateral low back pain without sciatica   6. Chronic neck pain     Labs/tests ordered:  * No order type specified * Next appt:  03/11/2022

## 2022-03-16 ENCOUNTER — Other Ambulatory Visit: Payer: Self-pay | Admitting: Internal Medicine

## 2022-03-16 LAB — URINE CULTURE
MICRO NUMBER:: 13799038
SPECIMEN QUALITY:: ADEQUATE

## 2022-03-16 LAB — URINALYSIS W MICROSCOPIC + REFLEX CULTURE
Bacteria, UA: NONE SEEN /HPF
Bilirubin Urine: NEGATIVE
Glucose, UA: NEGATIVE
Hgb urine dipstick: NEGATIVE
Hyaline Cast: NONE SEEN /LPF
Ketones, ur: NEGATIVE
Nitrites, Initial: NEGATIVE
Protein, ur: NEGATIVE
Specific Gravity, Urine: 1.013 (ref 1.001–1.035)
Squamous Epithelial / HPF: NONE SEEN /HPF (ref <=5)
WBC, UA: NONE SEEN /HPF (ref 0–5)
pH: 6 (ref 5.0–8.0)

## 2022-03-16 LAB — CULTURE INDICATED

## 2022-03-16 MED ORDER — NITROFURANTOIN MACROCRYSTAL 100 MG PO CAPS: 100.0000 mg | ORAL_CAPSULE | Freq: Two times a day (BID) | ORAL | 0 refills | Status: DC

## 2022-03-30 MED ORDER — ALPRAZOLAM 0.5 MG PO TABS: ORAL_TABLET | ORAL | 0 refills | Status: DC

## 2022-03-31 ENCOUNTER — Encounter: Payer: Self-pay | Admitting: Internal Medicine

## 2022-03-31 ENCOUNTER — Non-Acute Institutional Stay: Payer: Medicare Other | Admitting: Internal Medicine

## 2022-03-31 VITALS — BP 134/68 | HR 63 | Temp 97.5°F | Ht 63.5 in | Wt 149.6 lb

## 2022-03-31 DIAGNOSIS — M545 Low back pain, unspecified: Secondary | ICD-10-CM

## 2022-03-31 DIAGNOSIS — E7800 Pure hypercholesterolemia, unspecified: Secondary | ICD-10-CM

## 2022-03-31 DIAGNOSIS — R197 Diarrhea, unspecified: Secondary | ICD-10-CM

## 2022-03-31 DIAGNOSIS — F419 Anxiety disorder, unspecified: Secondary | ICD-10-CM

## 2022-03-31 DIAGNOSIS — Z952 Presence of prosthetic heart valve: Secondary | ICD-10-CM | POA: Diagnosis not present

## 2022-03-31 DIAGNOSIS — M542 Cervicalgia: Secondary | ICD-10-CM

## 2022-03-31 DIAGNOSIS — G8929 Other chronic pain: Secondary | ICD-10-CM

## 2022-03-31 DIAGNOSIS — I10 Essential (primary) hypertension: Secondary | ICD-10-CM | POA: Diagnosis not present

## 2022-03-31 DIAGNOSIS — K2100 Gastro-esophageal reflux disease with esophagitis, without bleeding: Secondary | ICD-10-CM | POA: Diagnosis not present

## 2022-03-31 DIAGNOSIS — R600 Localized edema: Secondary | ICD-10-CM | POA: Diagnosis not present

## 2022-03-31 DIAGNOSIS — N3000 Acute cystitis without hematuria: Secondary | ICD-10-CM

## 2022-03-31 DIAGNOSIS — E785 Hyperlipidemia, unspecified: Secondary | ICD-10-CM

## 2022-03-31 DIAGNOSIS — F32A Depression, unspecified: Secondary | ICD-10-CM | POA: Diagnosis not present

## 2022-03-31 NOTE — Patient Instructions (Signed)
Can take Tylenol three tablets 3 times a day Total Dose should be less then 3 grams  Also Use Lidocaine patches or Voltaren Gel for pain  Stop your Antibiotics after tomorrow

## 2022-04-09 ENCOUNTER — Other Ambulatory Visit: Payer: Self-pay | Admitting: Internal Medicine

## 2022-04-09 ENCOUNTER — Other Ambulatory Visit: Payer: Self-pay | Admitting: *Deleted

## 2022-04-09 MED ORDER — ALPRAZOLAM 0.5 MG PO TABS: ORAL_TABLET | ORAL | 0 refills | Status: DC

## 2022-04-09 NOTE — Telephone Encounter (Signed)
[name], daughter called and stated that Brenda [name] is not wanting to refill patient's medication for Alprazolam because the directions were not changed to Take one tablet in the morning and take two tablets in the evening.    Called Dr. Lyndel Safe and clarified and she agreed with the change.   Called [facility] and called in correct Directions.   Current medication list updated and daughter notified.

## 2022-04-11 NOTE — Progress Notes (Signed)
Location: [facility] (12)  Provider:   Code Status:  Goals of Care:     03/10/2022    3:41 PM  Advanced Directives  Does Patient Have a Medical Advance Directive? Yes  Type of Paramedic of [place];Living will;Out of facility DNR (pink MOST or yellow form)  Does patient want to make changes to medical advance directive? No - Patient declined  Copy of [place] in Chart? No - copy requested     Chief Complaint  Patient presents with   Medical Management of Chronic Issues    Medical Management of Chronic Issues. 3 Month follow up    HPI: Patient is a 88 y.o. female seen today for an acute visit for Follow up  Acute issues Acute Cystitis Her urine was positive Patient is on Macrodantin but he is having some nausea.   Low back pain which is chronic Diarrhea alternating with constipation Anxiety She stopped her Lexapro as it due to side effects. Taking 3 Xanax a day and her symptoms are better controlled  She also has a history of TAVR in 5/20 GE reflux disease with Barrett's, hypertension Skin cancer s/p Mohs surgery Says she is allergic to Shingrix and not suppose to take Covid Booster  Depression  Has pain in her neck and lower back due to degenerative disease. She has seen Dr. Ortencia Kick.  Has not tolerated gabapentin in the past. He suggested physical therapy Cannot do MRI due to Anterior plate  in her neck  Did not tolerate Cymbalta or Neurontin  CT scan did show  Advanced cervical spine degeneration with solid arthrodesis at C5-6 and facet ankylosis at C2-C4.With Foraminal Narrowing Has seen Neurosurgery before Daughter and Patient agree both are not candidate for further work up Past Medical History:  Diagnosis Date   Anxiety    Asthma    Barrett's esophagus    Cancer ([place])    DJD (degenerative joint disease)    GERD (gastroesophageal reflux disease)    Goiter     Hemorrhoids    History of colonoscopy    History of shingles    face/eye   History of subdural hematoma    History of traumatic subdural hematoma    Hx of colonic polyps    Hyperlipidemia    Hypertension    IBS (irritable bowel syndrome)    Osteoarthritis    S/P TAVR (transcatheter aortic valve replacement)    23 mm Edwards Sapien 3 transcatheter heart valve placed via percutaneous right transfemoral approach    Sarcoidosis    Sicca ([place])     Past Surgical History:  Procedure Laterality Date   BREAST LUMPECTOMY  1974   left   CATARACT EXTRACTION W/ INTRAOCULAR LENS  IMPLANT, BILATERAL     CRANIOTOMY Right 01/04/2014   Procedure: CRANIOTOMY HEMATOMA EVACUATION SUBDURAL;  Surgeon: [name], MD;  Location: [facility];  Service: Neurosurgery;  Laterality: Right;   CRANIOTOMY N/A 01/08/2014   Procedure: Redo CRANIOTOMY HEMATOMA EVACUATION SUBDURAL RIGHT;  Surgeon: [name], MD;  Location: [place] [facility];  Service: Neurosurgery;  Laterality: N/A;  Redo CRANIOTOMY HEMATOMA EVACUATION SUBDURAL RIGHT   CRANIOTOMY Right 01/10/2014   Procedure: Redo CRANIOTOMY HEMATOMA EVACUATION SUBDURAL;  Surgeon: [name], MD;  Location: [place] [facility];  Service: Neurosurgery;  Laterality: Right;   DILATION AND CURETTAGE OF UTERUS     EYE SURGERY     NASAL SINUS  SURGERY     NECK SURGERY     x 2   RIGHT/LEFT HEART CATH AND CORONARY ANGIOGRAPHY N/A 09/27/2018   Procedure: RIGHT/LEFT HEART CATH AND CORONARY ANGIOGRAPHY;  Surgeon: Burnell Blanks, MD;  Location: [place] [facility];  Service: Cardiovascular;  Laterality: N/A;   TEE WITHOUT CARDIOVERSION N/A 11/28/2018   Procedure: TRANSESOPHAGEAL ECHOCARDIOGRAM (TEE);  Surgeon: Sherren Mocha, MD;  Location: [place];  Service: Open Heart Surgery;  Laterality: N/A;   TONSILLECTOMY AND ADENOIDECTOMY  1976   TOTAL THYROIDECTOMY  06-05-08   TRANSCATHETER AORTIC VALVE REPLACEMENT, TRANSFEMORAL N/A 11/28/2018   Procedure: TRANSCATHETER AORTIC  VALVE REPLACEMENT, TRANSFEMORAL;  Surgeon: Sherren Mocha, MD;  Location: [place];  Service: Open Heart Surgery;  Laterality: N/A;    Allergies  Allergen Reactions   Losartan Potassium Other (See Comments)   Zoster Vaccine Live Other (See Comments)    Outpatient Encounter Medications as of 03/31/2022  Medication Sig   acetaminophen (TYLENOL) 650 MG CR tablet Take 650 mg by mouth 2 (two) times daily.   amLODipine (NORVASC) 5 MG tablet TAKE 1 TABLET(5 MG) BY MOUTH DAILY   amoxicillin (AMOXIL) 500 MG tablet TAKE 4 TABSULES ONE HOUR PRIOR TO ALL DENTAL VISITS   aspirin 81 MG chewable tablet Chew 1 tablet (81 mg total) by mouth daily.   carvedilol (COREG) 6.25 MG tablet Take 6.25 mg by mouth 2 (two) times daily with a meal. SEND TO [facility]   cholecalciferol (VITAMIN D3) 25 MCG (1000 UNIT) tablet Take 1,000 Units by mouth at bedtime.   cyanocobalamin 100 MCG tablet Take 100 mcg by mouth daily.   cycloSPORINE (RESTASIS) 0.05 % ophthalmic emulsion Place 1 drop into both eyes every 12 (twelve) hours.   Homeopathic Products (LEG CRAMP COMPLEX PO) Take 1 capsule by mouth daily.   levothyroxine (SYNTHROID) 75 MCG tablet TAKE 1 TABLET(75 MCG) BY MOUTH DAILY   nitrofurantoin (MACRODANTIN) 100 MG capsule Take 1 capsule (100 mg total) by mouth 2 (two) times daily.   rosuvastatin (CRESTOR) 5 MG tablet Take 1 tablet (5 mg total) by mouth daily.   senna-docusate (SENOKOT-S) 8.6-50 MG tablet Take 3 tablets by mouth at bedtime.    Zinc 50 MG CAPS Take 1 capsule by mouth daily as needed.   [DISCONTINUED] ALPRAZolam (XANAX) 0.5 MG tablet Take one tablet in the morning and 2 tablets in Evening   [DISCONTINUED] ALPRAZolam (XANAX) 0.5 MG tablet Take 1 tablet (0.5 mg total) by mouth 2 (two) times daily. Take one tablet in the morning and 2 tablets in Evening   [DISCONTINUED] Bacillus Coagulans-Inulin (PROBIOTIC-PREBIOTIC PO) Take 1 capsule by mouth daily at 6 (six) AM.   [DISCONTINUED] Multiple Vitamins-Minerals (B  COMPLEX PLUS VITAMIN C PO) Take 1 capsule by mouth daily at 6 (six) AM.   [DISCONTINUED] omeprazole (PRILOSEC) 20 MG capsule Take 20 mg by mouth daily.   [DISCONTINUED] vitamin E 180 MG (400 UNITS) capsule Take 400 Units by mouth daily.   No facility-administered encounter medications on file as of 03/31/2022.    Review of Systems:  Review of Systems  Constitutional:  Negative for activity change and appetite change.  HENT: Negative.    Respiratory:  Negative for cough and shortness of breath.   Cardiovascular:  Negative for leg swelling.  Gastrointestinal:  Positive for nausea. Negative for constipation.  Genitourinary: Negative.   Musculoskeletal:  Positive for back pain. Negative for arthralgias, gait problem and myalgias.  Skin: Negative.   Neurological:  Negative for dizziness and weakness.  Psychiatric/Behavioral:  Negative for confusion, dysphoric mood and sleep disturbance. The patient is nervous/anxious.     Health Maintenance  Topic Date Due   DEXA SCAN  Never done   COVID-19 Vaccine (1) 09/05/2027 (Originally 02/22/1936)   INFLUENZA VACCINE  10/24/2027 (Originally 02/23/2022)   Zoster Vaccines- Shingrix (1 of 2) 11/18/2027 (Originally 02/23/1950)   TETANUS/TDAP  12/19/2023   Pneumonia Vaccine 77+ Years old  Completed   HPV VACCINES  Aged Out    Physical Exam: Vitals:   03/31/22 1357  BP: 134/68  Pulse: 63  Temp: (!) 97.5 F (36.4 C)  SpO2: 94%  Weight: 149 lb 9.6 oz (67.9 kg)  Height: 5' 3.5" (1.613 m)   Body mass index is 26.08 kg/m. Physical Exam Vitals reviewed.  Constitutional:      Appearance: Normal appearance.  HENT:     Head: Normocephalic.     Nose: Nose normal.     Mouth/Throat:     Mouth: Mucous membranes are moist.     Pharynx: Oropharynx is clear.  Eyes:     Pupils: Pupils are equal, round, and reactive to light.  Cardiovascular:     Rate and Rhythm: Normal rate and regular rhythm.     Pulses: Normal pulses.     Heart sounds: Normal heart  sounds. No murmur heard. Pulmonary:     Effort: Pulmonary effort is normal.     Breath sounds: Normal breath sounds.  Abdominal:     General: Abdomen is flat. Bowel sounds are normal.     Palpations: Abdomen is soft.  Musculoskeletal:     Cervical back: Neck supple.     Comments: Mild swelling Bilateral Right more then left   Skin:    General: Skin is warm.  Neurological:     General: No focal deficit present.     Mental Status: She is alert and oriented to person, place, and time.  Psychiatric:        Mood and Affect: Mood normal.        Thought Content: Thought content normal.     Labs reviewed: Basic Metabolic Panel: Recent Labs    10/05/21 0845  NA 140  K 3.8  CL 104  CO2 26  GLUCOSE 90  BUN 24  CREATININE 0.90  CALCIUM 8.9  TSH 4.20   Liver Function Tests: Recent Labs    10/05/21 0845  AST 16  ALT 13  BILITOT 0.6  PROT 6.6   No results for input(s): "LIPASE", "AMYLASE" in the last 8760 hours. No results for input(s): "AMMONIA" in the last 8760 hours. CBC: Recent Labs    10/05/21 0845  WBC 6.5  NEUTROABS 4,206  HGB 13.6  HCT 40.4  MCV 93.1  PLT 170   Lipid Panel: Recent Labs    10/05/21 0845  CHOL 161  HDL 61  LDLCALC 79  TRIG 112  CHOLHDL 2.6   Lab Results  Component Value Date   HGBA1C 5.4 11/23/2018    Procedures since last visit: No results found.  Assessment/Plan 1. Acute cystitis without hematuria Will discontinue Macrodantin after tomorrow so total 5 days only due to her nausea  2. Anxiety and depression Lexapro Discontinued Xanax 3/day  3. Essential hypertension Now better was taking melatonin 10 mg which she has stopped now  4. Diarrhea, unspecified type Alternates with constipation Hold off GI right now If symptoms worse will make referal  5. Chronic bilateral low back pain without sciatica Tylenol  6. Chronic neck pain Tylenol  7. Hyperlipidemia, unspecified hyperlipidemia type Crestor  8. S/P TAVR  (transcatheter aortic valve replacement)   9 HTN On Coreg and Norvasc Labs/tests ordered:  * No order type specified * Next appt:  04/09/2022

## 2022-04-12 ENCOUNTER — Non-Acute Institutional Stay: Payer: Medicare Other | Admitting: Orthopedic Surgery

## 2022-04-12 ENCOUNTER — Encounter: Payer: Self-pay | Admitting: Orthopedic Surgery

## 2022-04-12 VITALS — BP 166/94 | HR 91 | Temp 97.1°F | Ht 63.0 in | Wt 144.4 lb

## 2022-04-12 DIAGNOSIS — R300 Dysuria: Secondary | ICD-10-CM

## 2022-04-12 DIAGNOSIS — G8929 Other chronic pain: Secondary | ICD-10-CM | POA: Diagnosis not present

## 2022-04-12 DIAGNOSIS — T3695XA Adverse effect of unspecified systemic antibiotic, initial encounter: Secondary | ICD-10-CM

## 2022-04-12 DIAGNOSIS — B379 Candidiasis, unspecified: Secondary | ICD-10-CM

## 2022-04-12 DIAGNOSIS — M545 Low back pain, unspecified: Secondary | ICD-10-CM

## 2022-04-12 MED ORDER — FLUCONAZOLE 150 MG PO TABS: 150.0000 mg | ORAL_TABLET | ORAL | 0 refills | Status: DC

## 2022-04-12 NOTE — Patient Instructions (Signed)
Try using Voltaren gel 2-3x/daily  Recommend going to yoga or exercise class at least 2x/weekly  Stretch every morning  Use heating pad  Please come to clinic on Thursday 09/21 to give urine sample

## 2022-04-12 NOTE — Progress Notes (Signed)
Location:  [place] of Service:  Clinic (12) Provider:  Yvonna Alanis, NP   Virgie Dad, MD  Patient Care Team: Virgie Dad, MD as PCP - General (Internal Medicine) Fay Records, MD as PCP - Cardiology (Cardiology) Gavin Pound, MD as Referring Physician (Internal Medicine) [facility], Jennefer [name], MD as Referring Physician (Dermatology) Sherlynn Stalls, MD as Consulting Physician (Ophthalmology) Vicie Mutters, MD as Attending Physician (Otolaryngology) Madelon Lips, MD as Consulting Physician (Nephrology) Jovita Kussmaul, PT (Endocrinology)  Extended Emergency Contact Information Primary Emergency Contact: Llana Aliment Mobile Phone: [phone] Relation: Daughter Secondary Emergency Contact: CAUDLE,TAMMY Mobile Phone: [phone] Relation: Daughter  Code Status:  DNR Goals of care: Advanced Directive information    03/10/2022    3:41 PM  Advanced Directives  Does Patient Have a Medical Advance Directive? Yes  Type of Paramedic of [place];Living will;Out of facility DNR (pink MOST or yellow form)  Does patient want to make changes to medical advance directive? No - Patient declined  Copy of [place] in Chart? No - copy requested     Chief Complaint  Patient presents with   Acute Visit    Patient complains of back and groin pain. Radiating into her thighs.    HPI:  Pt is a 88 y.o. female seen today for acute visit due to sciatic pain.   Daughter present during encounter.   08/17 urine culture positive for E.coli. She was unable to tolerate macrodantin due to nausea. She stopped taking antibiotic. She reports symptoms of increased vaginal itching and dysuria. Requesting recollection of urine culture today.   Low back pain ongoing. Described as intermittent sciatic pain, R>L.  She is taking tylenol 650 mg BID. She is more sedentary due to pain. She is not attending exercise classes like she  use to. Completed PT last month. Unsuccessful trial of Cymbalta and gabapentin in past.   09/2021 CT cervical spine revealed advanced degeneration at C5-6 and ankylosis C2-4, degenerative impingement C3-4 and C4-5, advanced OA noted to C1-2 as well.   2021 lumbar xray confirmed scoliosis and multilevel degenerative changes.   Treatment options discussed, does not want aggressive workup today.         Past Medical History:  Diagnosis Date   Anxiety    Asthma    Barrett's esophagus    Cancer (HCC)    DJD (degenerative joint disease)    GERD (gastroesophageal reflux disease)    Goiter    Hemorrhoids    History of colonoscopy    History of shingles    face/eye   History of subdural hematoma    History of traumatic subdural hematoma    Hx of colonic polyps    Hyperlipidemia    Hypertension    IBS (irritable bowel syndrome)    Osteoarthritis    S/P TAVR (transcatheter aortic valve replacement)    23 mm Edwards Sapien 3 transcatheter heart valve placed via percutaneous right transfemoral approach    Sarcoidosis    Sicca ([place])    Past Surgical History:  Procedure Laterality Date   BREAST LUMPECTOMY  1974   left   CATARACT EXTRACTION W/ INTRAOCULAR LENS  IMPLANT, BILATERAL     CRANIOTOMY Right 01/04/2014   Procedure: CRANIOTOMY HEMATOMA EVACUATION SUBDURAL;  Surgeon: [name], MD;  Location: [facility];  Service: Neurosurgery;  Laterality: Right;   CRANIOTOMY N/A 01/08/2014   Procedure: Redo CRANIOTOMY HEMATOMA EVACUATION SUBDURAL RIGHT;  Surgeon: Louie Casa  Sharlyne Cai, MD;  Location: [place] [facility];  Service: Neurosurgery;  Laterality: N/A;  Redo CRANIOTOMY HEMATOMA EVACUATION SUBDURAL RIGHT   CRANIOTOMY Right 01/10/2014   Procedure: Redo CRANIOTOMY HEMATOMA EVACUATION SUBDURAL;  Surgeon: [name], MD;  Location: [place] [facility];  Service: Neurosurgery;  Laterality: Right;   DILATION AND CURETTAGE OF UTERUS     EYE SURGERY     NASAL SINUS SURGERY     NECK SURGERY     x  2   RIGHT/LEFT HEART CATH AND CORONARY ANGIOGRAPHY N/A 09/27/2018   Procedure: RIGHT/LEFT HEART CATH AND CORONARY ANGIOGRAPHY;  Surgeon: Burnell Blanks, MD;  Location: [place] [facility];  Service: Cardiovascular;  Laterality: N/A;   TEE WITHOUT CARDIOVERSION N/A 11/28/2018   Procedure: TRANSESOPHAGEAL ECHOCARDIOGRAM (TEE);  Surgeon: Sherren Mocha, MD;  Location: [place];  Service: Open Heart Surgery;  Laterality: N/A;   TONSILLECTOMY AND ADENOIDECTOMY  1976   TOTAL THYROIDECTOMY  06-05-08   TRANSCATHETER AORTIC VALVE REPLACEMENT, TRANSFEMORAL N/A 11/28/2018   Procedure: TRANSCATHETER AORTIC VALVE REPLACEMENT, TRANSFEMORAL;  Surgeon: Sherren Mocha, MD;  Location: [place];  Service: Open Heart Surgery;  Laterality: N/A;    Allergies  Allergen Reactions   Losartan Potassium Other (See Comments)   Zoster Vaccine Live Other (See Comments)    Outpatient Encounter Medications as of 04/12/2022  Medication Sig   acetaminophen (TYLENOL) 650 MG CR tablet Take 650 mg by mouth 2 (two) times daily.   ALPRAZolam (XANAX) 0.5 MG tablet Take one tablet in the morning and take two tablets in the evening.   amLODipine (NORVASC) 5 MG tablet TAKE 1 TABLET(5 MG) BY MOUTH DAILY   amoxicillin (AMOXIL) 500 MG tablet TAKE 4 TABSULES ONE HOUR PRIOR TO ALL DENTAL VISITS   aspirin 81 MG chewable tablet Chew 1 tablet (81 mg total) by mouth daily.   carvedilol (COREG) 6.25 MG tablet Take 6.25 mg by mouth 2 (two) times daily with a meal. SEND TO [facility]   cholecalciferol (VITAMIN D3) 25 MCG (1000 UNIT) tablet Take 1,000 Units by mouth at bedtime.   cyanocobalamin 100 MCG tablet Take 100 mcg by mouth daily.   cycloSPORINE (RESTASIS) 0.05 % ophthalmic emulsion Place 1 drop into both eyes every 12 (twelve) hours.   Homeopathic Products (LEG CRAMP COMPLEX PO) Take 1 capsule by mouth daily.   levothyroxine (SYNTHROID) 75 MCG tablet TAKE 1 TABLET(75 MCG) BY MOUTH DAILY   rosuvastatin (CRESTOR) 5 MG tablet Take 1 tablet  (5 mg total) by mouth daily.   senna-docusate (SENOKOT-S) 8.6-50 MG tablet Take 3 tablets by mouth at bedtime.    Zinc 50 MG CAPS Take 1 capsule by mouth daily as needed.   [DISCONTINUED] nitrofurantoin (MACRODANTIN) 100 MG capsule Take 1 capsule (100 mg total) by mouth 2 (two) times daily.   No facility-administered encounter medications on file as of 04/12/2022.    Review of Systems  Constitutional:  Positive for activity change. Negative for appetite change, chills, fatigue and fever.  HENT:  Negative for congestion and sore throat.   Eyes:  Negative for visual disturbance.  Respiratory:  Negative for cough, shortness of breath and wheezing.   Cardiovascular:  Negative for chest pain and leg swelling.  Gastrointestinal:  Negative for abdominal distention, abdominal pain, constipation, diarrhea, nausea and vomiting.  Genitourinary:  Negative for dysuria, frequency and hematuria.  Musculoskeletal:  Positive for arthralgias, back pain, gait problem and neck pain. Negative for myalgias.  Skin:  Negative for wound.  Neurological:  Positive for  numbness. Negative for dizziness, weakness and headaches.  Psychiatric/Behavioral:  Negative for confusion, dysphoric mood and sleep disturbance. The patient is not nervous/anxious.     Immunization History  Administered Date(s) Administered   Fluad Quad(high Dose 65+) 07/25/2019   Influenza Split 07/24/2012, 05/08/2013   Influenza, High Dose Seasonal PF 04/25/2015, 07/21/2018   Influenza,inj,Quad PF,6+ Mos 04/07/2011, 07/12/2013   Influenza-Unspecified 05/22/2014   Pneumococcal Conjugate-13 07/21/2018   Pneumococcal Polysaccharide-23 05/05/1999   Td 03/16/2001   Tdap 12/18/2013   Zoster, Live 07/25/2012   Pertinent  Health Maintenance Due  Topic Date Due   DEXA SCAN  Never done   INFLUENZA VACCINE  10/24/2027 (Originally 02/23/2022)      02/11/2021    2:12 PM 04/01/2021    1:52 PM 08/19/2021    4:11 PM 10/14/2021    3:14 PM 03/10/2022     3:41 PM  [place] in the past year? 0 0 0 0 0  Was there an injury with Fall?   0 0 0  Fall Risk Category Calculator   0 0 0  Fall Risk Category   Low Low Low  Patient Fall Risk Level Low fall risk  Low fall risk Low fall risk Low fall risk  Patient at Risk for Falls Due to   No Fall Risks No Fall Risks No Fall Risks  Fall risk Follow up Falls evaluation completed Falls evaluation completed Falls evaluation completed Falls evaluation completed Falls evaluation completed   Functional Status Survey:    Vitals:   04/12/22 1314  BP: (!) 166/94  Pulse: 91  Temp: (!) 97.1 F (36.2 C)  SpO2: 96%  Weight: 144 lb 6.4 oz (65.5 kg)  Height: '5\' 3"'$  (1.6 m)   Body mass index is 25.58 kg/m. Physical Exam Vitals reviewed.  Constitutional:      General: She is not in acute distress. HENT:     Head: Normocephalic.  Eyes:     General:        Right eye: No discharge.        Left eye: No discharge.  Cardiovascular:     Rate and Rhythm: Normal rate and regular rhythm.     Pulses: Normal pulses.     Heart sounds: Murmur heard.  Pulmonary:     Effort: Pulmonary effort is normal. No respiratory distress.     Breath sounds: Normal breath sounds. No wheezing.  Abdominal:     General: Bowel sounds are normal. There is no distension.     Palpations: Abdomen is soft.     Tenderness: There is no abdominal tenderness.  Musculoskeletal:     Cervical back: Neck supple. Tenderness present. No swelling, deformity, bony tenderness or crepitus. Pain with movement present. Normal range of motion.     Thoracic back: No swelling or tenderness. Decreased range of motion. Scoliosis present.     Lumbar back: No swelling or tenderness. Normal range of motion.     Right lower leg: No edema.     Left lower leg: No edema.     Comments: Tenderness of right sciatic/piriformis  Skin:    General: Skin is warm and dry.     Capillary Refill: Capillary refill takes less than 2 seconds.  Neurological:      General: No focal deficit present.     Mental Status: She is alert and oriented to person, place, and time.     Motor: No weakness.     Gait: Gait normal.  Psychiatric:  Mood and Affect: Mood normal.        Behavior: Behavior normal.     Labs reviewed: Recent Labs    10/05/21 0845  NA 140  K 3.8  CL 104  CO2 26  GLUCOSE 90  BUN 24  CREATININE 0.90  CALCIUM 8.9   Recent Labs    10/05/21 0845  AST 16  ALT 13  BILITOT 0.6  PROT 6.6   Recent Labs    10/05/21 0845  WBC 6.5  NEUTROABS 4,206  HGB 13.6  HCT 40.4  MCV 93.1  PLT 170   Lab Results  Component Value Date   TSH 4.20 10/05/2021   Lab Results  Component Value Date   HGBA1C 5.4 11/23/2018   Lab Results  Component Value Date   CHOL 161 10/05/2021   HDL 61 10/05/2021   LDLCALC 79 10/05/2021   LDLDIRECT 124.0 09/25/2020   TRIG 112 10/05/2021   CHOLHDL 2.6 10/05/2021    Significant Diagnostic Results in last 30 days:  No results found.  Assessment/Plan 1. Dysuria - 08/17 urine culture positive for E.Coli - unable to tolerate macrodantin - now having dysuria and vaginal itching - repeat urine culture- scheduled 09/21 at [facility] clinic - start Diflucan x 2 doses - fluconazole (DIFLUCAN) 150 MG tablet; Take 1 tablet (150 mg total) by mouth every 3 (three) days for 2 doses.  Dispense: 1 tablet; Refill: 0 - Culture, Urine; Future - Culture, Urine  2. Antibiotic-induced yeast infection - see above  3. Chronic bilateral low back pain without sciatica - ongoing - completed PT - does not want aggressive workup - unsuccessful trial Cymbalta and gabapentin in past - start voltaren gel 2-3x/daily  - recommend yoga and attending exercise classes  Total time: 31 minutes. Greater than 50% of total time spent doing patient education regarding ongoing back pain, dysuria, symptom/medication management.      Family/ staff Communication: plan discussed with patient and daughter  Labs/tests ordered:   urine culture 09/21

## 2022-04-14 ENCOUNTER — Other Ambulatory Visit: Payer: Self-pay | Admitting: Orthopedic Surgery

## 2022-04-14 DIAGNOSIS — R300 Dysuria: Secondary | ICD-10-CM

## 2022-04-14 MED ORDER — FLUCONAZOLE 150 MG PO TABS: 150.0000 mg | ORAL_TABLET | ORAL | 0 refills | Status: AC

## 2022-04-14 NOTE — Telephone Encounter (Signed)
Please see attached notes from pharmacy. Message routed and medication pend for PCP Virgie Dad, MD

## 2022-04-15 ENCOUNTER — Other Ambulatory Visit: Payer: Medicare Other

## 2022-04-15 DIAGNOSIS — R300 Dysuria: Secondary | ICD-10-CM | POA: Diagnosis not present

## 2022-04-17 LAB — URINE CULTURE
MICRO NUMBER:: 13949470
Result:: NO GROWTH
SPECIMEN QUALITY:: ADEQUATE

## 2022-04-17 LAB — EXTRA URINE SPECIMEN

## 2022-04-23 ENCOUNTER — Other Ambulatory Visit: Payer: Self-pay | Admitting: Internal Medicine

## 2022-04-23 ENCOUNTER — Telehealth: Payer: Self-pay | Admitting: Internal Medicine

## 2022-04-23 DIAGNOSIS — M25552 Pain in left hip: Secondary | ICD-10-CM

## 2022-04-23 NOTE — Telephone Encounter (Signed)
Referral done

## 2022-04-23 NOTE — Progress Notes (Signed)
Ortho referral  

## 2022-04-23 NOTE — Telephone Encounter (Signed)
Pt daughter [name] called to request referral to [facility], Dr. Linden Dolin. Pt saw him in 2020.  Pt is requesting injections in hip as discussed with Lyndel Safe previously.

## 2022-04-29 ENCOUNTER — Encounter: Payer: Self-pay | Admitting: Orthopedic Surgery

## 2022-05-03 ENCOUNTER — Other Ambulatory Visit: Payer: Self-pay | Admitting: Internal Medicine

## 2022-05-03 MED ORDER — FLUCONAZOLE 100 MG PO TABS: 100.0000 mg | ORAL_TABLET | Freq: Every day | ORAL | 0 refills | Status: DC

## 2022-05-04 DIAGNOSIS — M25551 Pain in right hip: Secondary | ICD-10-CM | POA: Diagnosis not present

## 2022-05-04 DIAGNOSIS — M545 Low back pain, unspecified: Secondary | ICD-10-CM | POA: Insufficient documentation

## 2022-05-04 DIAGNOSIS — M542 Cervicalgia: Secondary | ICD-10-CM | POA: Diagnosis not present

## 2022-05-04 DIAGNOSIS — M25552 Pain in left hip: Secondary | ICD-10-CM | POA: Diagnosis not present

## 2022-05-07 ENCOUNTER — Other Ambulatory Visit: Payer: Self-pay | Admitting: Internal Medicine

## 2022-05-07 NOTE — Telephone Encounter (Signed)
Pharmacy requested refill.  Epic LR: 04/09/2022 Pended Rx and sent to Dr. Lyndel Safe for approval.

## 2022-05-31 ENCOUNTER — Other Ambulatory Visit: Payer: Self-pay | Admitting: Internal Medicine

## 2022-06-07 ENCOUNTER — Other Ambulatory Visit: Payer: Self-pay | Admitting: Internal Medicine

## 2022-06-07 NOTE — Telephone Encounter (Signed)
Pharmacy requested refill.  EPIC LR: 05/07/2022 Contract Date: 03/30/2022  Pended Rx and sent to Dr. Lyndel Safe for approval.

## 2022-06-21 ENCOUNTER — Telehealth: Payer: Self-pay

## 2022-06-21 NOTE — Telephone Encounter (Signed)
[place] Resident   Message left on clinical intake voicemail:   Patient has a pending lab appointment for 06/24/2022 and her daughter is questioning if patient can have labs to check for H.Pylori and UTI.  Patient still with digestive issues and symptoms of a UTI.   I called home number x 3 and each time the call ended abruptly, it did not even ring. I called patients mobile and left a detailed message asking for her to return the call to schedule an appointment for [facility].  I then called patients daughter [name] and she expressed that they are having some carrier difficulties with the land line. [name] will text her mom and call back to schedule an appointment for today at 3:30 with Windell Moulding, NP or for Wednesday with Dr.Gupta

## 2022-06-22 ENCOUNTER — Encounter: Payer: Self-pay | Admitting: Internal Medicine

## 2022-06-22 DIAGNOSIS — M545 Low back pain, unspecified: Secondary | ICD-10-CM | POA: Diagnosis not present

## 2022-06-24 ENCOUNTER — Other Ambulatory Visit: Payer: Medicare Other

## 2022-06-30 ENCOUNTER — Non-Acute Institutional Stay: Payer: Medicare Other | Admitting: Internal Medicine

## 2022-06-30 ENCOUNTER — Encounter: Payer: Self-pay | Admitting: Internal Medicine

## 2022-06-30 VITALS — BP 172/66 | HR 64 | Temp 97.6°F | Resp 18 | Ht 63.0 in | Wt 147.2 lb

## 2022-06-30 DIAGNOSIS — M545 Low back pain, unspecified: Secondary | ICD-10-CM

## 2022-06-30 DIAGNOSIS — R197 Diarrhea, unspecified: Secondary | ICD-10-CM | POA: Diagnosis not present

## 2022-06-30 DIAGNOSIS — E785 Hyperlipidemia, unspecified: Secondary | ICD-10-CM | POA: Diagnosis not present

## 2022-06-30 DIAGNOSIS — K2100 Gastro-esophageal reflux disease with esophagitis, without bleeding: Secondary | ICD-10-CM | POA: Diagnosis not present

## 2022-06-30 DIAGNOSIS — G8929 Other chronic pain: Secondary | ICD-10-CM | POA: Diagnosis not present

## 2022-06-30 DIAGNOSIS — F32A Depression, unspecified: Secondary | ICD-10-CM

## 2022-06-30 DIAGNOSIS — F419 Anxiety disorder, unspecified: Secondary | ICD-10-CM

## 2022-06-30 DIAGNOSIS — R350 Frequency of micturition: Secondary | ICD-10-CM

## 2022-06-30 DIAGNOSIS — I10 Essential (primary) hypertension: Secondary | ICD-10-CM | POA: Diagnosis not present

## 2022-06-30 DIAGNOSIS — Z952 Presence of prosthetic heart valve: Secondary | ICD-10-CM

## 2022-06-30 LAB — POCT URINALYSIS DIP (MANUAL ENTRY)
Bilirubin, UA: NEGATIVE
Glucose, UA: NEGATIVE mg/dL
Ketones, POC UA: NEGATIVE mg/dL
Nitrite, UA: NEGATIVE
Spec Grav, UA: <=1.005 — AB (ref 1.010–1.025)
Urobilinogen, UA: 0.2 E.U./dL
pH, UA: 6.5 (ref 5.0–8.0)

## 2022-06-30 MED ORDER — CARVEDILOL 12.5 MG PO TABS: 12.5000 mg | ORAL_TABLET | Freq: Two times a day (BID) | ORAL | 3 refills | Status: DC

## 2022-06-30 MED ORDER — BUSPIRONE HCL 5 MG PO TABS: 5.0000 mg | ORAL_TABLET | Freq: Two times a day (BID) | ORAL | 1 refills | Status: DC

## 2022-06-30 MED ORDER — PANTOPRAZOLE SODIUM 40 MG PO TBEC: 40.0000 mg | DELAYED_RELEASE_TABLET | Freq: Every day | ORAL | 3 refills | Status: DC

## 2022-07-01 LAB — EXTRA URINE SPECIMEN

## 2022-07-01 LAB — URINE CULTURE
MICRO NUMBER:: 14278576
Result:: NO GROWTH
SPECIMEN QUALITY:: ADEQUATE

## 2022-07-04 NOTE — Progress Notes (Signed)
Location:  [place] [facility] (12)  Provider:   Code Status: DNR Goals of Care:     06/30/2022    3:07 PM  Advanced Directives  Does Patient Have a Medical Advance Directive? No;Yes  Type of Paramedic of [place];Living will;Out of facility DNR (pink MOST or yellow form)  Does patient want to make changes to medical advance directive? No - Patient declined     Chief Complaint  Patient presents with   Medical Management of Chronic Issues    Patient is here for 3 month follow up    HPI: Patient is a 88 y.o. female seen today for medical management of chronic diseases.    Lives in [place] in [place]  Urinary frequency No Fever or Dysuria Continues to be issue   Diarrhea Most likely IBS  Anxiety Did not tolerate Lexapro ? Diarrhea Takes Xanax Prn Daughter thinks most of her issues are related to Anxiety  history of TAVR in 11/2018  GE reflux disease with Barrett's Symptoms were better with Prilosec  Hypertension Still running high Stopped melatonin Skin cancer s/p Mohs surgery Says she is allergic to Shingrix and not suppose to take Covid Booster  Has pain in her neck and lower back due to degenerative disease. Continues to be her issue but manages well with Tylenol She has seen Dr. Ortencia Kick.  Has not tolerated gabapentin in the past. He suggested physical therapy Cannot do MRI due to Anterior plate  in her neck  Did not tolerate Cymbalta or Neurontin  CT scan did show  Advanced cervical spine degeneration with solid arthrodesis at C5-6 and facet ankylosis at C2-C4.With Foraminal Narrowing Has seen Neurosurgery before Daughter and Patient agree both are not candidate for further work up    Past Medical History:  Diagnosis Date   Anxiety    Asthma    Barrett's esophagus    Cancer ([place])    DJD (degenerative joint disease)    GERD (gastroesophageal reflux disease)    Goiter    Hemorrhoids     History of colonoscopy    History of shingles    face/eye   History of subdural hematoma    History of traumatic subdural hematoma    Hx of colonic polyps    Hyperlipidemia    Hypertension    IBS (irritable bowel syndrome)    Osteoarthritis    S/P TAVR (transcatheter aortic valve replacement)    23 mm Edwards Sapien 3 transcatheter heart valve placed via percutaneous right transfemoral approach    Sarcoidosis    Sicca ([place])     Past Surgical History:  Procedure Laterality Date   BREAST LUMPECTOMY  1974   left   CATARACT EXTRACTION W/ INTRAOCULAR LENS  IMPLANT, BILATERAL     CRANIOTOMY Right 01/04/2014   Procedure: CRANIOTOMY HEMATOMA EVACUATION SUBDURAL;  Surgeon: [name], MD;  Location: [facility];  Service: Neurosurgery;  Laterality: Right;   CRANIOTOMY N/A 01/08/2014   Procedure: Redo CRANIOTOMY HEMATOMA EVACUATION SUBDURAL RIGHT;  Surgeon: [name], MD;  Location: [place] [facility];  Service: Neurosurgery;  Laterality: N/A;  Redo CRANIOTOMY HEMATOMA EVACUATION SUBDURAL RIGHT   CRANIOTOMY Right 01/10/2014   Procedure: Redo CRANIOTOMY HEMATOMA EVACUATION SUBDURAL;  Surgeon: [name], MD;  Location: [place] [facility];  Service: Neurosurgery;  Laterality: Right;   DILATION AND CURETTAGE OF UTERUS     EYE SURGERY     NASAL SINUS  SURGERY     NECK SURGERY     x 2   RIGHT/LEFT HEART CATH AND CORONARY ANGIOGRAPHY N/A 09/27/2018   Procedure: RIGHT/LEFT HEART CATH AND CORONARY ANGIOGRAPHY;  Surgeon: Burnell Blanks, MD;  Location: [place] [facility];  Service: Cardiovascular;  Laterality: N/A;   TEE WITHOUT CARDIOVERSION N/A 11/28/2018   Procedure: TRANSESOPHAGEAL ECHOCARDIOGRAM (TEE);  Surgeon: Sherren Mocha, MD;  Location: [place];  Service: Open Heart Surgery;  Laterality: N/A;   TONSILLECTOMY AND ADENOIDECTOMY  1976   TOTAL THYROIDECTOMY  06-05-08   TRANSCATHETER AORTIC VALVE REPLACEMENT, TRANSFEMORAL N/A 11/28/2018   Procedure: TRANSCATHETER AORTIC VALVE REPLACEMENT,  TRANSFEMORAL;  Surgeon: Sherren Mocha, MD;  Location: [place];  Service: Open Heart Surgery;  Laterality: N/A;    Allergies  Allergen Reactions   Losartan Potassium Other (See Comments)   Zoster Vaccine Live Other (See Comments)    Outpatient Encounter Medications as of 06/30/2022  Medication Sig   acetaminophen (TYLENOL) 650 MG CR tablet Take 650 mg by mouth every 8 (eight) hours.   ALPRAZolam (XANAX) 0.5 MG tablet TAKE 1 TABLET BY MOUTH EVERY MORNING THEN TAKE 2 TABLETS BY MOUTH EVERY EVENING   amLODipine (NORVASC) 5 MG tablet TAKE 1 TABLET(5 MG) BY MOUTH DAILY   amoxicillin (AMOXIL) 500 MG tablet TAKE 4 TABSULES ONE HOUR PRIOR TO ALL DENTAL VISITS   aspirin 81 MG chewable tablet Chew 1 tablet (81 mg total) by mouth daily.   busPIRone (BUSPAR) 5 MG tablet Take 1 tablet (5 mg total) by mouth 2 (two) times daily.   carvedilol (COREG) 12.5 MG tablet Take 1 tablet (12.5 mg total) by mouth 2 (two) times daily with a meal.   cholecalciferol (VITAMIN D3) 25 MCG (1000 UNIT) tablet Take 1,000 Units by mouth at bedtime.   cyanocobalamin 100 MCG tablet Take 100 mcg by mouth daily.   cycloSPORINE (RESTASIS) 0.05 % ophthalmic emulsion Place 1 drop into both eyes every 12 (twelve) hours.   Homeopathic Products (LEG CRAMP COMPLEX PO) Take 1 capsule by mouth daily.   levothyroxine (SYNTHROID) 75 MCG tablet TAKE 1 TABLET(75 MCG) BY MOUTH DAILY   pantoprazole (PROTONIX) 40 MG tablet Take 1 tablet (40 mg total) by mouth daily.   rosuvastatin (CRESTOR) 5 MG tablet TAKE ONE TABLET BY MOUTH DAILY   senna-docusate (SENOKOT-S) 8.6-50 MG tablet Take 3 tablets by mouth at bedtime.    Zinc 50 MG CAPS Take 1 capsule by mouth daily as needed.   [DISCONTINUED] carvedilol (COREG) 6.25 MG tablet Take 6.25 mg by mouth 2 (two) times daily with a meal. SEND TO [facility]   [DISCONTINUED] fluconazole (DIFLUCAN) 100 MG tablet Take 1 tablet (100 mg total) by mouth daily.   No facility-administered encounter medications on  file as of 06/30/2022.    Review of Systems:  Review of Systems  Constitutional:  Negative for activity change and appetite change.  HENT: Negative.    Respiratory:  Negative for cough and shortness of breath.   Cardiovascular:  Negative for leg swelling.  Gastrointestinal:  Positive for diarrhea. Negative for constipation.  Genitourinary:  Positive for frequency.  Musculoskeletal:  Positive for back pain. Negative for arthralgias, gait problem and myalgias.  Skin: Negative.   Neurological:  Negative for dizziness and weakness.  Psychiatric/Behavioral:  Positive for sleep disturbance. Negative for confusion and dysphoric mood. The patient is nervous/anxious.     Health Maintenance  Topic Date Due   DEXA SCAN  Never done   Medicare Annual Wellness (AWV)  03/14/2021  COVID-19 Vaccine (1) 09/05/2027 (Originally 08/25/1931)   INFLUENZA VACCINE  10/24/2027 (Originally 02/23/2022)   Zoster Vaccines- Shingrix (1 of 2) 11/18/2027 (Originally 02/23/1983)   DTaP/Tdap/Td (3 - Td or Tdap) 12/19/2023   Pneumonia Vaccine 41+ Years old  Completed   HPV VACCINES  Aged Out    Physical Exam: Vitals:   06/30/22 1500  BP: (!) 172/66  Pulse: 64  Resp: 18  Temp: 97.6 F (36.4 C)  TempSrc: Temporal  SpO2: 96%  Weight: 147 lb 3.2 oz (66.8 kg)  Height: '5\' 3"'$  (1.6 m)   Body mass index is 26.08 kg/m. Physical Exam Vitals reviewed.  Constitutional:      Appearance: Normal appearance.  HENT:     Head: Normocephalic.     Nose: Nose normal.     Mouth/Throat:     Mouth: Mucous membranes are moist.     Pharynx: Oropharynx is clear.  Eyes:     Pupils: Pupils are equal, round, and reactive to light.  Cardiovascular:     Rate and Rhythm: Normal rate and regular rhythm.     Pulses: Normal pulses.     Heart sounds: Murmur heard.  Pulmonary:     Effort: Pulmonary effort is normal.     Breath sounds: Normal breath sounds.  Abdominal:     General: Abdomen is flat. Bowel sounds are normal.      Palpations: Abdomen is soft.  Musculoskeletal:        General: No swelling.     Cervical back: Neck supple.  Skin:    General: Skin is warm.  Neurological:     General: No focal deficit present.     Mental Status: She is alert and oriented to person, place, and time.  Psychiatric:        Mood and Affect: Mood normal.        Thought Content: Thought content normal.     Labs reviewed: Basic Metabolic Panel: Recent Labs    10/05/21 0845  NA 140  K 3.8  CL 104  CO2 26  GLUCOSE 90  BUN 24  CREATININE 0.90  CALCIUM 8.9  TSH 4.20   Liver Function Tests: Recent Labs    10/05/21 0845  AST 16  ALT 13  BILITOT 0.6  PROT 6.6   No results for input(s): "LIPASE", "AMYLASE" in the last 8760 hours. No results for input(s): "AMMONIA" in the last 8760 hours. CBC: Recent Labs    10/05/21 0845  WBC 6.5  NEUTROABS 4,206  HGB 13.6  HCT 40.4  MCV 93.1  PLT 170   Lipid Panel: Recent Labs    10/05/21 0845  CHOL 161  HDL 61  LDLCALC 79  TRIG 112  CHOLHDL 2.6   Lab Results  Component Value Date   HGBA1C 5.4 11/23/2018    Procedures since last visit: No results found.  Assessment/Plan 1. Frequent urination Urine was negative Discussed considering new med like Myrbetriq but will wait till BP better controlled - POCT urinalysis dipstick - Culture, Urine  2. Chronic bilateral low back pain without sciatica Just using Tylenol as has not tolerated Gabapentin  Don't want to try Tramadol right now  3. Essential hypertension Change Coreg to 12.5 mg BID Follow up in  1 month  4. Gastroesophageal reflux disease with esophagitis without hemorrhage Will call in Protonix as it is helping her symptoms of GERD  5. Anxiety and depression Wants to try Buspar Intolerant to Lexapro 6 HLD On statin 7 Hypothyroidism TSH 03/23  Labs/tests ordered:   Next appt:  08/23/2022

## 2022-07-04 NOTE — Progress Notes (Deleted)
Cardiology Office Note   Date:  07/04/2022   ID:  Brenda, [name] 04-May-1931, MRN [id number]  PCP:  Virgie Dad, MD  Cardiologist:   [name], MD   F/U of AV disease and HTN      History of Present Illness: Brenda [name] is a 88 y.o. female with a history of aortic stenosis s/p TAVR 11/28/18, HTN, HLD, sarcoidosis, GE reflux disease with Barrett's esophagus, chronic  cervical spine issue  and osteoarthritis    She underwent successful TAVR with a 23 mm Edwards Sapien 3 THV via the TF approach on 11/28/2018. Post operative echo showed normally functioning TAVR with mean gradient of 8 mm Hg and no PVL. She was started on norvasc 5 mg on follow up for elevated blood pressure.  She was last in [place] in Jan 2022.    Since seen she has done OK from a cardiac [place].   Breathing is OK   No dizzienss   No CP   No palpitations    I saw the pt in Aug 2022   No outpatient medications have been marked as taking for the 07/06/22 encounter (Appointment) with Fay Records, MD.     Allergies:   Losartan potassium and Zoster vaccine live   Past Medical History:  Diagnosis Date   Anxiety    Asthma    Barrett's esophagus    Cancer ([place])    DJD (degenerative joint disease)    GERD (gastroesophageal reflux disease)    Goiter    Hemorrhoids    History of colonoscopy    History of shingles    face/eye   History of subdural hematoma    History of traumatic subdural hematoma    Hx of colonic polyps    Hyperlipidemia    Hypertension    IBS (irritable bowel syndrome)    Osteoarthritis    S/P TAVR (transcatheter aortic valve replacement)    23 mm Edwards Sapien 3 transcatheter heart valve placed via percutaneous right transfemoral approach    Sarcoidosis    Sicca ([place])     Past Surgical History:  Procedure Laterality Date   BREAST LUMPECTOMY  1974   left   CATARACT EXTRACTION W/ INTRAOCULAR LENS  IMPLANT, BILATERAL     CRANIOTOMY Right 01/04/2014   Procedure: CRANIOTOMY  HEMATOMA EVACUATION SUBDURAL;  Surgeon: [name], MD;  Location: [facility];  Service: Neurosurgery;  Laterality: Right;   CRANIOTOMY N/A 01/08/2014   Procedure: [place] CRANIOTOMY HEMATOMA EVACUATION SUBDURAL RIGHT;  Surgeon: [name], MD;  Location: [facility];  Service: Neurosurgery;  Laterality: N/A;  Redo CRANIOTOMY HEMATOMA EVACUATION SUBDURAL RIGHT   CRANIOTOMY Right 01/10/2014   Procedure: Redo CRANIOTOMY HEMATOMA EVACUATION SUBDURAL;  Surgeon: [name], MD;  Location: [facility];  Service: Neurosurgery;  Laterality: Right;   DILATION AND CURETTAGE OF UTERUS     EYE SURGERY     NASAL SINUS SURGERY     NECK SURGERY     x 2   RIGHT/LEFT HEART CATH AND CORONARY ANGIOGRAPHY N/A 09/27/2018   Procedure: RIGHT/LEFT HEART CATH AND CORONARY ANGIOGRAPHY;  Surgeon: Burnell Blanks, MD;  Location: [facility];  Service: Cardiovascular;  Laterality: N/A;   TEE WITHOUT CARDIOVERSION N/A 11/28/2018   Procedure: TRANSESOPHAGEAL ECHOCARDIOGRAM (TEE);  Surgeon: Sherren Mocha, MD;  Location: [facility];  Service: Open Heart Surgery;  Laterality: N/A;   [place]   TOTAL THYROIDECTOMY  06-05-08  TRANSCATHETER AORTIC VALVE REPLACEMENT, TRANSFEMORAL N/A 11/28/2018   Procedure: TRANSCATHETER AORTIC VALVE REPLACEMENT, TRANSFEMORAL;  Surgeon: Sherren Mocha, MD;  Location: [facility];  Service: Open Heart Surgery;  Laterality: N/A;     Social History:  The patient  reports that she has never smoked. She has never used smokeless tobacco. She reports current alcohol use. She reports that she does not use drugs.   Family History:  The patient's family history includes Allergies in her sister, sister, sister, and sister; Asthma in her sister; Colon cancer in her sister; Heart disease in her father; Heart failure in her father; Hyperlipidemia in her father and mother; Hypertension in her brother, father, mother, and sister; Kidney cancer in her maternal  grandmother and mother; Lymphoma in her mother; Rheum arthritis in her father; Thyroid cancer in her sister.    ROS:  Please see the history of present illness. All other systems are reviewed and  Negative to the above problem except as noted.    PHYSICAL EXAM: VS:  There were no vitals taken for this visit.  GEN: Well nourished, well developed, in no acute distress  HEENT: normal  Neck: no JVD,  Cardiac: RRR; No significant murmrus  Respiratory:  clear to auscultation bilaterally,  GI: soft, nontender, nondistended, + BS  No hepatomegaly  MS: no deformity Moving all extremities   Skin: warm and dry, no rash Neuro:  Strength and sensation are intact Psych: euthymic mood, full affect   EKG:  EKG is not ordered today     Lipid Panel    Component Value Date/Time   CHOL 161 10/05/2021 0845   CHOL 273 (H) 08/18/2020 1153   TRIG 112 10/05/2021 0845   HDL 61 10/05/2021 0845   HDL 68 08/18/2020 1153   CHOLHDL 2.6 10/05/2021 0845   VLDL 51.2 (H) 09/25/2020 1541   LDLCALC 79 10/05/2021 0845   LDLDIRECT 124.0 09/25/2020 1541      Wt Readings from Last 3 Encounters:  06/30/22 147 lb 3.2 oz (66.8 kg)  04/12/22 144 lb 6.4 oz (65.5 kg)  03/31/22 149 lb 9.6 oz (67.9 kg)      ASSESSMENT AND PLAN:  1  AV disease   Pt s/p TAVR in May 2020   Echo after shows valve is functioning well    Valve sounds unchanged  Follow clinically     2  HTN  BP remains a little high   Would go ahead and add amlodipien     2.5 mg   Call/Mychart with BP   3   HL   LDL 91, HDL 62     4  Dizziness  Asymptomatic      Current medicines are reviewed at length with the patient today.  The patient does not have concerns regarding medicines.  Signed, [name], MD  07/04/2022 10:16 PM    [facility] [address], [place], [place]  [address] Phone: [phone]; Fax: [phone]

## 2022-07-05 ENCOUNTER — Encounter: Payer: Self-pay | Admitting: Internal Medicine

## 2022-07-06 ENCOUNTER — Ambulatory Visit: Payer: Medicare Other | Admitting: Internal Medicine

## 2022-07-06 ENCOUNTER — Other Ambulatory Visit: Payer: Self-pay | Admitting: Internal Medicine

## 2022-07-06 DIAGNOSIS — M545 Low back pain, unspecified: Secondary | ICD-10-CM

## 2022-07-06 DIAGNOSIS — G8929 Other chronic pain: Secondary | ICD-10-CM

## 2022-07-08 ENCOUNTER — Other Ambulatory Visit: Payer: Self-pay | Admitting: Internal Medicine

## 2022-07-09 ENCOUNTER — Telehealth: Payer: Self-pay | Admitting: Family

## 2022-07-09 ENCOUNTER — Ambulatory Visit: Payer: Medicare Other | Admitting: Nurse Practitioner

## 2022-07-09 NOTE — Telephone Encounter (Signed)
Patient's daughter returned call concerning lab results.Labs results given per Dr.Gupta's response.Final urine culture showed no growth.

## 2022-07-16 ENCOUNTER — Emergency Department (HOSPITAL_BASED_OUTPATIENT_CLINIC_OR_DEPARTMENT_OTHER): Payer: Medicare Other

## 2022-07-16 ENCOUNTER — Encounter (HOSPITAL_BASED_OUTPATIENT_CLINIC_OR_DEPARTMENT_OTHER): Payer: Self-pay | Admitting: Emergency Medicine

## 2022-07-16 ENCOUNTER — Telehealth (INDEPENDENT_AMBULATORY_CARE_PROVIDER_SITE_OTHER): Payer: Medicare Other | Admitting: Adult Health

## 2022-07-16 ENCOUNTER — Other Ambulatory Visit: Payer: Self-pay

## 2022-07-16 ENCOUNTER — Encounter: Payer: Self-pay | Admitting: Adult Health

## 2022-07-16 ENCOUNTER — Emergency Department (HOSPITAL_BASED_OUTPATIENT_CLINIC_OR_DEPARTMENT_OTHER)
Admission: EM | Admit: 2022-07-16 | Discharge: 2022-07-16 | Disposition: A | Discharge status: Home / Self Care | Payer: Medicare Other | Attending: Emergency Medicine | Admitting: Emergency Medicine

## 2022-07-16 ENCOUNTER — Other Ambulatory Visit: Payer: Self-pay | Admitting: Internal Medicine

## 2022-07-16 VITALS — HR 95 | Temp 101.5°F | Ht 63.0 in | Wt 147.0 lb

## 2022-07-16 DIAGNOSIS — Z20822 Contact with and (suspected) exposure to covid-19: Secondary | ICD-10-CM | POA: Insufficient documentation

## 2022-07-16 DIAGNOSIS — K2270 Barrett's esophagus without dysplasia: Secondary | ICD-10-CM

## 2022-07-16 DIAGNOSIS — K5730 Diverticulosis of large intestine without perforation or abscess without bleeding: Secondary | ICD-10-CM | POA: Diagnosis not present

## 2022-07-16 DIAGNOSIS — D72829 Elevated white blood cell count, unspecified: Secondary | ICD-10-CM | POA: Insufficient documentation

## 2022-07-16 DIAGNOSIS — R109 Unspecified abdominal pain: Secondary | ICD-10-CM

## 2022-07-16 DIAGNOSIS — K529 Noninfective gastroenteritis and colitis, unspecified: Secondary | ICD-10-CM | POA: Insufficient documentation

## 2022-07-16 DIAGNOSIS — K2100 Gastro-esophageal reflux disease with esophagitis, without bleeding: Secondary | ICD-10-CM

## 2022-07-16 DIAGNOSIS — R197 Diarrhea, unspecified: Secondary | ICD-10-CM

## 2022-07-16 DIAGNOSIS — F0390 Unspecified dementia without behavioral disturbance: Secondary | ICD-10-CM | POA: Insufficient documentation

## 2022-07-16 DIAGNOSIS — R1011 Right upper quadrant pain: Secondary | ICD-10-CM | POA: Diagnosis not present

## 2022-07-16 DIAGNOSIS — R112 Nausea with vomiting, unspecified: Secondary | ICD-10-CM | POA: Diagnosis not present

## 2022-07-16 DIAGNOSIS — Z7982 Long term (current) use of aspirin: Secondary | ICD-10-CM | POA: Diagnosis not present

## 2022-07-16 LAB — CBC
HCT: 50.1 % — ABNORMAL HIGH (ref 36.0–46.0)
Hemoglobin: 17.1 g/dL — ABNORMAL HIGH (ref 12.0–15.0)
MCH: 32.4 pg (ref 26.0–34.0)
MCHC: 34.1 g/dL (ref 30.0–36.0)
MCV: 95.1 fL (ref 80.0–100.0)
Platelets: 147 10*3/uL — ABNORMAL LOW (ref 150–400)
RBC: 5.27 MIL/uL — ABNORMAL HIGH (ref 3.87–5.11)
RDW: 14.6 % (ref 11.5–15.5)
WBC: 9.2 10*3/uL (ref 4.0–10.5)
nRBC: 0 % (ref 0.0–0.2)

## 2022-07-16 LAB — URINALYSIS, ROUTINE W REFLEX MICROSCOPIC
Bacteria, UA: NONE SEEN
Bilirubin Urine: NEGATIVE
Glucose, UA: NEGATIVE mg/dL
Ketones, ur: NEGATIVE mg/dL
Leukocytes,Ua: NEGATIVE
Nitrite: NEGATIVE
Protein, ur: 30 mg/dL — AB
Specific Gravity, Urine: >1.046 — ABNORMAL HIGH (ref 1.005–1.030)
pH: 6 (ref 5.0–8.0)

## 2022-07-16 LAB — COMPREHENSIVE METABOLIC PANEL
ALT: 51 U/L — ABNORMAL HIGH (ref 0–44)
AST: 46 U/L — ABNORMAL HIGH (ref 15–41)
Albumin: 4 g/dL (ref 3.5–5.0)
Alkaline Phosphatase: 48 U/L (ref 38–126)
Anion gap: 17 — ABNORMAL HIGH (ref 5–15)
BUN: 32 mg/dL — ABNORMAL HIGH (ref 8–23)
CO2: 21 mmol/L — ABNORMAL LOW (ref 22–32)
Calcium: 7.9 mg/dL — ABNORMAL LOW (ref 8.9–10.3)
Chloride: 103 mmol/L (ref 98–111)
Creatinine, Ser: 0.86 mg/dL (ref 0.44–1.00)
GFR, Estimated: >60 mL/min (ref >60)
Glucose, Bld: 151 mg/dL — ABNORMAL HIGH (ref 70–99)
Potassium: 3.4 mmol/L — ABNORMAL LOW (ref 3.5–5.1)
Sodium: 141 mmol/L (ref 135–145)
Total Bilirubin: 1 mg/dL (ref 0.3–1.2)
Total Protein: 6.7 g/dL (ref 6.5–8.1)

## 2022-07-16 LAB — MAGNESIUM: Magnesium: 1.8 mg/dL (ref 1.7–2.4)

## 2022-07-16 LAB — RESP PANEL BY RT-PCR (RSV, FLU A&B, COVID)  RVPGX2
Influenza A by PCR: NEGATIVE
Influenza B by PCR: NEGATIVE
Resp Syncytial Virus by PCR: NEGATIVE
SARS Coronavirus 2 by RT PCR: NEGATIVE

## 2022-07-16 LAB — LIPASE, BLOOD: Lipase: 105 U/L — ABNORMAL HIGH (ref 11–51)

## 2022-07-16 MED ORDER — ONDANSETRON HCL 4 MG PO TABS: 4.0000 mg | ORAL_TABLET | Freq: Four times a day (QID) | ORAL | 0 refills | Status: DC

## 2022-07-16 MED ORDER — LORAZEPAM 2 MG/ML IJ SOLN
0.5000 mg | Freq: Once | INTRAMUSCULAR | Status: AC
  Administered 2022-07-16: 0.5 mg via INTRAVENOUS
  Filled 2022-07-16: qty 1

## 2022-07-16 MED ORDER — ONDANSETRON HCL 4 MG/2ML IJ SOLN
4.0000 mg | Freq: Once | INTRAMUSCULAR | Status: AC
  Administered 2022-07-16: 4 mg via INTRAVENOUS
  Filled 2022-07-16: qty 2

## 2022-07-16 MED ORDER — KETOROLAC TROMETHAMINE 30 MG/ML IJ SOLN
30.0000 mg | Freq: Once | INTRAMUSCULAR | Status: AC
  Administered 2022-07-16: 30 mg via INTRAVENOUS

## 2022-07-16 MED ORDER — ACETAMINOPHEN 325 MG PO TABS
650.0000 mg | ORAL_TABLET | Freq: Once | ORAL | Status: AC
  Administered 2022-07-16: 650 mg via ORAL
  Filled 2022-07-16: qty 2

## 2022-07-16 MED ORDER — IOHEXOL 300 MG/ML  SOLN
100.0000 mL | Freq: Once | INTRAMUSCULAR | Status: AC | PRN
  Administered 2022-07-16: 80 mL via INTRAVENOUS

## 2022-07-16 MED ORDER — DICYCLOMINE HCL 10 MG/ML IM SOLN
20.0000 mg | Freq: Once | INTRAMUSCULAR | Status: AC
  Administered 2022-07-16: 20 mg via INTRAMUSCULAR
  Filled 2022-07-16: qty 2

## 2022-07-16 MED ORDER — SODIUM CHLORIDE 0.9 % IV BOLUS
1000.0000 mL | Freq: Once | INTRAVENOUS | Status: AC
  Administered 2022-07-16: 1000 mL via INTRAVENOUS

## 2022-07-16 MED ORDER — DICYCLOMINE HCL 20 MG PO TABS: 20.0000 mg | ORAL_TABLET | Freq: Two times a day (BID) | ORAL | 0 refills | Status: DC

## 2022-07-16 NOTE — Progress Notes (Signed)
This service is provided via telemedicine  No vital signs collected/recorded due to the encounter was a telemedicine telephone visit.   Location of patient (ex: home, work):  home  Patient consents to a telephone visit:  patients daughter with patient and consented  Location of the provider (ex: office, home):  [facility]  Names of all persons participating in the telemedicine service and their role in the encounter:  michelle wilborn (daughter), Brenda [name] ( patient), Melissa Pickett CCMA,CMAA, Durenda Age, NP  Time spent on call:  12 minutes      DATE:  07/16/2022 MRN:  [id number]  BIRTHDAY: 03-02-1931   Contact Information     Name Relation Home Work [address]   Llana Aliment Daughter   [phone]   [place] Daughter   [phone]        Code Status History     Date Active Date Inactive Code Status Order ID Comments User Context   06/07/2019 1912 06/11/2019 1810 Full Code [id number]  Mckinley Jewel, MD ED   11/28/2018 [place] 11/29/2018 1548 Full Code [id number]  Crista Luria Inpatient   09/27/2018 1340 09/27/2018 2054 Full Code [id number]  Burnell Blanks, MD Inpatient   01/16/2014 1623 01/22/2014 1311 Full Code [id number]  Elizabeth Sauer Inpatient   01/10/2014 1900 01/16/2014 1623 Full Code [id number]  [name], MD Inpatient   01/08/2014 1420 01/10/2014 1900 Full Code [id number]  [name], MD Inpatient   01/04/2014 1956 01/08/2014 1420 Full Code [id number]  [name], MD Inpatient   01/03/2014 2344 01/04/2014 1956 Full Code [id number]  Eustace Moore, MD Inpatient        Chief Complaint  Patient presents with   Acute Visit    Patient being seen for diarrhea, fever, nausea and vomiting, decreased appetite Temperature in the 100's  extreme all night and morning concerned about dehydration    HISTORY OF PRESENT ILLNESS: This is an 88 year old female who had a failed video visit.  Provider connected with her and her daughter via telephone. Patient was vomiting and diarrhea since last night. She is not able to take in food nor medicine. Also, she has diarrhea since last night. Daughter stated that she has anal bleeding due to frequent diarrhea. Temperature last night was 101.5 and now 100.5. She feels her mouth is so dry and complaining of body aches.  She was tested for COVID-19 today and was negative. Discussed with staff nurse at the facility that she is needing IV fluids and some blood work ups. Patient stays at [facility] and IV fluids and blood works cannot be done in the facility.  Advised patient, daughter and staff nurse that patient needs to go to ED for IV fluids and further testing   PAST MEDICAL HISTORY:  Past Medical History:  Diagnosis Date   Anxiety    Asthma    Barrett's esophagus    Cancer ([place])    DJD (degenerative joint disease)    GERD (gastroesophageal reflux disease)    Goiter    Hemorrhoids    History of colonoscopy    History of shingles    face/eye   History of subdural hematoma    History of traumatic subdural hematoma    Hx of colonic polyps    Hyperlipidemia    Hypertension    IBS (irritable bowel syndrome)    Osteoarthritis    S/P TAVR (transcatheter aortic valve replacement)  23 mm Edwards Sapien 3 transcatheter heart valve placed via percutaneous right transfemoral approach    Sarcoidosis    Sicca ([place])      CURRENT MEDICATIONS: Reviewed  Patient's Medications  New Prescriptions   No medications on file  Previous Medications   ACETAMINOPHEN (TYLENOL) 650 MG CR TABLET    Take 650 mg by mouth every 8 (eight) hours.   ALPRAZOLAM (XANAX) 0.5 MG TABLET    TAKE 1 TABLET BY MOUTH EVERY MORNING THEN TAKE 2 TABLETS BY MOUTH EVERY EVENING   AMLODIPINE (NORVASC) 5 MG TABLET    TAKE 1 TABLET(5 MG) BY MOUTH DAILY   AMOXICILLIN (AMOXIL) 500 MG TABLET    TAKE 4 TABSULES ONE HOUR PRIOR TO ALL DENTAL VISITS    ASPIRIN 81 MG CHEWABLE TABLET    Chew 1 tablet (81 mg total) by mouth daily.   BUSPIRONE (BUSPAR) 5 MG TABLET    Take 1 tablet (5 mg total) by mouth 2 (two) times daily.   CARVEDILOL (COREG) 12.5 MG TABLET    Take 1 tablet (12.5 mg total) by mouth 2 (two) times daily with a meal.   CHOLECALCIFEROL (VITAMIN D3) 25 MCG (1000 UNIT) TABLET    Take 1,000 Units by mouth at bedtime.   CYANOCOBALAMIN 100 MCG TABLET    Take 100 mcg by mouth daily.   CYCLOSPORINE (RESTASIS) 0.05 % OPHTHALMIC EMULSION    Place 1 drop into both eyes every 12 (twelve) hours.   HOMEOPATHIC PRODUCTS (LEG CRAMP COMPLEX PO)    Take 1 capsule by mouth daily.   LEVOTHYROXINE (SYNTHROID) 75 MCG TABLET    TAKE 1 TABLET(75 MCG) BY MOUTH DAILY   PANTOPRAZOLE (PROTONIX) 40 MG TABLET    Take 1 tablet (40 mg total) by mouth daily.   ROSUVASTATIN (CRESTOR) 5 MG TABLET    TAKE ONE TABLET BY MOUTH DAILY   SENNA-DOCUSATE (SENOKOT-S) 8.6-50 MG TABLET    Take 3 tablets by mouth at bedtime.    ZINC 50 MG CAPS    Take 1 capsule by mouth daily as needed.  Modified Medications   No medications on file  Discontinued Medications   No medications on file     Allergies  Allergen Reactions   Losartan Potassium Other (See Comments)   Zoster Vaccine Live Other (See Comments)     REVIEW OF SYSTEMS:  GENERAL: +fever SKIN: Denies rash, itching, wounds, ulcer sores, or nail abnormality EYES: Denies change in vision, dry eyes, eye pain, itching or discharge EARS: Denies change in hearing, ringing in ears, or earache NOSE: Denies nasal congestion or epistaxis MOUTH and THROAT: feels mouth is dry RESPIRATORY: no cough, SOB, DOE, wheezing, hemoptysis CARDIAC: no chest pain, edema or palpitations GI: + diarrhea and vomiting GU: Denies dysuria, frequency, hematuria, incontinence, or discharge MUSCULOSKELETAL: has back pain NEUROLOGICAL: Denies dizziness, syncope, numbness, or headache PSYCHIATRIC: Denies feeling of depression or anxiety. No  report of hallucinations, insomnia, paranoia, or agitation   LABS/RADIOLOGY: Labs reviewed: Basic Metabolic Panel: Recent Labs    10/05/21 0845  NA 140  K 3.8  CL 104  CO2 26  GLUCOSE 90  BUN 24  CREATININE 0.90  CALCIUM 8.9   Liver Function Tests: Recent Labs    10/05/21 0845  AST 16  ALT 13  BILITOT 0.6  PROT 6.6   No results for input(s): "LIPASE", "AMYLASE" in the last 8760 hours. No results for input(s): "AMMONIA" in the last 8760 hours. CBC: Recent Labs    10/05/21 0845  WBC 6.5  NEUTROABS 4,206  HGB 13.6  HCT 40.4  MCV 93.1  PLT 170   A1C: Invalid input(s): "A1C" Lipid Panel: Recent Labs    10/05/21 0845  HDL 61   Cardiac Enzymes: No results for input(s): "CKTOTAL", "CKMB", "CKMBINDEX", "TROPONINI" in the last 8760 hours. BNP: Invalid input(s): "POCBNP" CBG: No results for input(s): "GLUCAP" in the last 8760 hours.    No results found.  ASSESSMENT/PLAN:  1. Diarrhea, unspecified type -    COVID-19 test was negative -   stool culture for C-Difficile  2. Nausea and vomiting, unspecified vomiting type -  unable to take in food/drink and can't take medications -   not able to do IV fluids at the facility -  discussed with patient, daughter and staff nurse that patient needs to go to ED for her IV fluids and further evaluation, CMP and CBC with differential    Time spent on non face to face visit:  12 minutes  The patient gave consent to this telephone visit. Explained to the patient the risk and privacy issue that was involved with this telephone call.   The patient was advised to call back and ask for an in-person evaluation if the symptoms worsen or if the condition fails to improve.   Durenda Age, NP [employer] [phone]

## 2022-07-16 NOTE — Patient Instructions (Signed)
Diarrhea, Adult Diarrhea is when you pass loose and sometimes watery poop (stool) often. Diarrhea can make you feel weak and cause you to lose water in your body (get dehydrated). Losing water in your body can cause you to: Feel tired and thirsty. Have a dry mouth. Go pee (urinate) less often. Diarrhea often lasts 2-3 days. It can last longer if it is a sign of something more serious. Be sure to treat your diarrhea as told by your doctor. Follow these instructions at home: Eating and drinking     Follow these instructions as told by your doctor: Take an ORS (oral rehydration solution). This is a drink that helps you replace fluids and minerals your body lost. It is sold at pharmacies and stores. Drink enough fluid to keep your pee (urine) pale yellow. Drink fluids such as: Water. You can also get fluids by sucking on ice chips. Diluted fruit juice. Low-calorie sports drinks. Milk. Avoid drinking fluids that have a lot of sugar or caffeine in them. These include soda, energy drinks, and regular sports drinks. Avoid alcohol. Eat bland, easy-to-digest foods in small amounts as you are able. These foods include: Bananas. Applesauce. Rice. Low-fat (lean) meats. Toast. Crackers. Avoid spicy or fatty foods.  Medicines Take over-the-counter and prescription medicines only as told by your doctor. If you were prescribed antibiotics, take them as told by your doctor. Do not stop taking them even if you start to feel better. General instructions  Wash your hands often using soap and water for 20 seconds. If soap and water are not available, use hand sanitizer. Others in your home should wash their hands as well. Wash your hands: After using the toilet or changing a diaper. Before preparing, cooking, or serving food. While caring for a sick person. While visiting someone in a hospital. Rest at home while you get better. Take a warm bath to help with any burning or pain from having  diarrhea. Watch your condition for any changes. Contact a doctor if: You have a fever. Your diarrhea gets worse. You have new symptoms. You vomit every time you eat or drink. You feel light-headed, dizzy, or you have a headache. You have muscle cramps. You have signs of losing too much water in your body, such as: Dark pee, very little pee, or no pee. Cracked lips. Dry mouth. Sunken eyes. Sleepiness. Weakness. You have bloody or black poop or poop that looks like tar. You have very bad pain, cramping, or bloating in your belly (abdomen). Your skin feels cold and clammy. You feel confused. Get help right away if: You have chest pain. Your heart is beating very quickly. You have trouble breathing or you are breathing very quickly. You feel very weak or you faint. These symptoms may be an emergency. Get help right away. Call 911. Do not wait to see if the symptoms will go away. Do not drive yourself to the hospital. This information is not intended to replace advice given to you by your health care provider. Make sure you discuss any questions you have with your health care provider. Document Revised: 12/29/2021 Document Reviewed: 12/29/2021 Elsevier Patient Education  2023 [employer]  
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
## 2022-07-16 NOTE — Discharge Instructions (Addendum)
You were seen in the emergency department for your abdominal pain, nausea, vomiting and diarrhea.  Your workup showed no signs of obvious infection and mild dehydration.  We gave you fluids and nausea medicine and your symptoms improved.  You likely have a viral infection causing your symptoms.  You can continue to take Zofran as needed for nausea and Tylenol or Bentyl as needed for abdominal pain.  You should make sure that you are drinking plenty of fluids and staying well-hydrated.  You should follow-up with your primary doctor in the next few days to have your symptoms rechecked.  You should return to the emergency department if you have significantly worsening pain, repetitive vomiting despite the nausea medication, you pass out or if you have any other new or concerning symptoms.

## 2022-07-16 NOTE — ED Triage Notes (Signed)
Pt arrives to ED with c/o diarrhea and vomiting that started at 1am last night.

## 2022-07-16 NOTE — ED Provider Notes (Signed)
Patient signed out to me at 1500 by Dr. Langston Masker pending right upper quadrant ultrasound and p.o. trial.  In short this is an 88 year old female that presented to the emergency department with nausea, vomiting and diarrhea.  Her workup showed signs of mild dehydration and she has been given IV fluids and nausea control.  She states that she is still having mild abdominal discomfort but has had no further vomiting and has been tolerating water.  Her right upper quadrant ultrasound showed gallbladder sludge but no signs of cholecystitis.  Her UA is pending at this time.  She will be given additional Tylenol for her pain.  Clinical Course as of 07/16/22 1816  Fri Jul 16, 2022  1517 IM Bentyl to be given for ongoing abdominal pain.  I reassessed the patient given her distended gallbladder.  Is really difficult to ascertain whether this is the cause of her symptoms, she clearly has some dementia and also complains of chronic pain according to her daughter.  She does not have a Murphy sign that is positive, or guarding in the right upper quadrant.  Will try to evaluate the right upper quadrant ultrasound.  Patient is signed out to Dr Maylon Peppers EDP pending ultrasound, and also p.o. challenge.  If she can tolerate fluids, potentially Zofran, could be discharged back to her facility.  I suspect this is otherwise likely viral gastroenteritis [MT]  1814 Upon reassessment, patient's urine is negative.  She has been able to tolerate p.o. and her pain is well-controlled.  She is stable for discharge home with primary care follow-up and was given strict return precautions. [VK]    Clinical Course User Index [MT] Wyvonnia Dusky, MD [VK] Kemper Durie, DO      Kemper Durie, [place] 07/16/22 1816

## 2022-07-16 NOTE — ED Notes (Signed)
Patient drank glass of water states has no nausea or vomiting

## 2022-07-20 ENCOUNTER — Telehealth: Payer: Self-pay

## 2022-07-20 ENCOUNTER — Encounter: Payer: Self-pay | Admitting: Gastroenterology

## 2022-07-20 NOTE — Telephone Encounter (Signed)
Transition Care Management Unsuccessful Follow-up Telephone Call  Date of discharge and from where:  [facility]   Attempts:  1st Attempt  Reason for unsuccessful TCM follow-up call:  Unable to reach patient

## 2022-07-22 DIAGNOSIS — M47812 Spondylosis without myelopathy or radiculopathy, cervical region: Secondary | ICD-10-CM | POA: Diagnosis not present

## 2022-07-22 DIAGNOSIS — M542 Cervicalgia: Secondary | ICD-10-CM | POA: Diagnosis not present

## 2022-07-28 ENCOUNTER — Other Ambulatory Visit: Payer: Self-pay | Admitting: Internal Medicine

## 2022-08-03 ENCOUNTER — Other Ambulatory Visit: Payer: Self-pay | Admitting: Adult Health

## 2022-08-11 ENCOUNTER — Ambulatory Visit: Payer: Medicare Other | Admitting: Internal Medicine
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
## 2022-08-12 ENCOUNTER — Ambulatory Visit: Payer: Medicare Other | Attending: Internal Medicine | Admitting: Nurse Practitioner

## 2022-08-12 ENCOUNTER — Encounter: Payer: Self-pay | Admitting: Nurse Practitioner

## 2022-08-12 VITALS — BP 142/70 | HR 62 | Ht 64.0 in | Wt 143.2 lb

## 2022-08-12 DIAGNOSIS — I350 Nonrheumatic aortic (valve) stenosis: Secondary | ICD-10-CM

## 2022-08-12 DIAGNOSIS — R140 Abdominal distension (gaseous): Secondary | ICD-10-CM

## 2022-08-12 DIAGNOSIS — I5032 Chronic diastolic (congestive) heart failure: Secondary | ICD-10-CM | POA: Diagnosis not present

## 2022-08-12 DIAGNOSIS — I2510 Atherosclerotic heart disease of native coronary artery without angina pectoris: Secondary | ICD-10-CM

## 2022-08-12 DIAGNOSIS — E785 Hyperlipidemia, unspecified: Secondary | ICD-10-CM

## 2022-08-12 DIAGNOSIS — Z952 Presence of prosthetic heart valve: Secondary | ICD-10-CM | POA: Diagnosis not present

## 2022-08-12 DIAGNOSIS — I10 Essential (primary) hypertension: Secondary | ICD-10-CM | POA: Diagnosis not present

## 2022-08-12 NOTE — Patient Instructions (Signed)
Medication Instructions:   Your physician recommends that you continue on your current medications as directed. Please refer to the Current Medication list given to you today.   *If you need a refill on your cardiac medications before your next appointment, please call your pharmacy*   Lab Work:  None ordered.  If you have labs (blood work) drawn today and your tests are completely normal, you will receive your results only by: [place] (if you have MyChart) OR A paper copy in the mail If you have any lab test that is abnormal or we need to change your treatment, we will call you to review the results.   Testing/Procedures:  None ordered.   Follow-Up: At [facility], you and your health needs are our priority.  As part of our continuing mission to provide you with exceptional heart care, we have created designated Provider Care Teams.  These Care Teams include your primary Cardiologist (physician) and Advanced Practice Providers (APPs -  Physician Assistants and Nurse Practitioners) who all work together to provide you with the care you need, when you need it.  We recommend signing up for the patient portal called "MyChart".  Sign up information is provided on this After Visit Summary.  MyChart is used to connect with patients for Virtual Visits (Telemedicine).  Patients are able to view lab/test results, encounter notes, upcoming appointments, etc.  Non-urgent messages can be sent to your provider as well.   To learn more about what you can do with MyChart, go to [email or web address].    Your next appointment:   5 month(s)  Provider:   [name], MD

## 2022-08-17 ENCOUNTER — Ambulatory Visit (INDEPENDENT_AMBULATORY_CARE_PROVIDER_SITE_OTHER): Payer: Medicare Other | Admitting: Gastroenterology

## 2022-08-17 ENCOUNTER — Encounter: Payer: Self-pay | Admitting: Gastroenterology

## 2022-08-17 ENCOUNTER — Telehealth: Payer: Self-pay

## 2022-08-17 VITALS — BP 122/68 | HR 85 | Ht 64.0 in | Wt 145.0 lb

## 2022-08-17 DIAGNOSIS — R195 Other fecal abnormalities: Secondary | ICD-10-CM | POA: Diagnosis not present

## 2022-08-17 DIAGNOSIS — K219 Gastro-esophageal reflux disease without esophagitis: Secondary | ICD-10-CM | POA: Diagnosis not present

## 2022-08-17 DIAGNOSIS — Z952 Presence of prosthetic heart valve: Secondary | ICD-10-CM

## 2022-08-17 DIAGNOSIS — R6881 Early satiety: Secondary | ICD-10-CM

## 2022-08-17 DIAGNOSIS — I2510 Atherosclerotic heart disease of native coronary artery without angina pectoris: Secondary | ICD-10-CM

## 2022-08-17 DIAGNOSIS — R197 Diarrhea, unspecified: Secondary | ICD-10-CM | POA: Diagnosis not present

## 2022-08-17 DIAGNOSIS — R142 Eructation: Secondary | ICD-10-CM | POA: Diagnosis not present

## 2022-08-17 MED ORDER — NA SULFATE-K SULFATE-MG SULF 17.5-3.13-1.6 GM/177ML PO SOLN: 1.0000 | Freq: Once | ORAL | 0 refills | Status: AC

## 2022-08-17 NOTE — Patient Instructions (Signed)
You have been scheduled for an endoscopy and colonoscopy. Please follow the written instructions given to you at your visit today. Please pick up your prep supplies at the pharmacy within the next 1-3 days. If you use inhalers (even only as needed), please bring them with you on the day of your procedure.   Due to recent changes in healthcare laws, you may see the results of your imaging and laboratory studies on MyChart before your provider has had a chance to review them.  We understand that in some cases there may be results that are confusing or concerning to you. Not all laboratory results come back in the same time frame and the provider may be waiting for multiple results in order to interpret others.  Please give [place] 48 hours in order for your provider to thoroughly review all the results before contacting the office for clarification of your results.    _______________________________________________________  If your blood pressure at your visit was 140/90 or greater, please contact your primary care physician to follow up on this.  _______________________________________________________  If you are age 87 or older, your body mass index should be between 23-30. Your Body mass index is 24.89 kg/m. If this is out of the aforementioned range listed, please consider follow up with your Primary Care Provider.   __________________________________________________________  The [facility] providers would like to encourage you to use [facility] to communicate with providers for non-urgent requests or questions.  Due to long hold times on the telephone, sending your provider a message by [facility] may be a faster and more efficient way to get a response.  Please allow 48 business hours for a response.  Please remember that this is for non-urgent requests.   Thank you for choosing me and [facility].  Vito Cirigliano, D.O.

## 2022-08-17 NOTE — Telephone Encounter (Signed)
[facility] Pre-operative Risk Assessment     Request for surgical clearance:  Endoscopy Procedure  What type of surgery is being performed?  Colonoscopy, EGD  When is this surgery scheduled?  09/29/22  What type of clearance is required ?  Medical  Are there any medications that need to be held prior to surgery and how long? No  Practice name and name of physician performing surgery?  Dr. Bryan Lemma,   [facility]  What is your office phone and fax number?      Phone- [phone]  [phone]  Anesthesia type (None, local, MAC, general) ?       MAC

## 2022-08-17 NOTE — Telephone Encounter (Signed)
   Patient Name: Brenda [name]  DOB: 1930-12-18 MRN: [id number]  Primary Cardiologist: [name], MD  Chart reviewed as part of pre-operative protocol coverage.  Called patient to discuss surgical clearance request as patient was recently seen in the office on 08/12/2022.  No answer.  Left message for patient to call back at earliest convenience.    Lenna Sciara, NP 08/17/2022, 3:34 PM
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
## 2022-08-20 ENCOUNTER — Other Ambulatory Visit: Payer: Self-pay

## 2022-08-20 DIAGNOSIS — I10 Essential (primary) hypertension: Secondary | ICD-10-CM

## 2022-08-20 DIAGNOSIS — E785 Hyperlipidemia, unspecified: Secondary | ICD-10-CM

## 2022-08-20 DIAGNOSIS — E038 Other specified hypothyroidism: Secondary | ICD-10-CM

## 2022-08-22 ENCOUNTER — Other Ambulatory Visit: Payer: Self-pay | Admitting: Internal Medicine

## 2022-08-23 ENCOUNTER — Other Ambulatory Visit: Payer: Medicare Other

## 2022-08-23 DIAGNOSIS — E038 Other specified hypothyroidism: Secondary | ICD-10-CM | POA: Diagnosis not present

## 2022-08-23 DIAGNOSIS — E785 Hyperlipidemia, unspecified: Secondary | ICD-10-CM | POA: Diagnosis not present

## 2022-08-23 DIAGNOSIS — I10 Essential (primary) hypertension: Secondary | ICD-10-CM | POA: Diagnosis not present

## 2022-08-23 LAB — LIPID PANEL
Cholesterol: 245 mg/dL — ABNORMAL HIGH (ref <200)
HDL: 56 mg/dL (ref >=50)
LDL Cholesterol (Calc): 162 mg/dL (calc) — ABNORMAL HIGH
Non-HDL Cholesterol (Calc): 189 mg/dL (calc) — ABNORMAL HIGH (ref <130)
Total CHOL/HDL Ratio: 4.4 (calc) (ref <5.0)
Triglycerides: 143 mg/dL (ref <150)

## 2022-08-23 LAB — CBC WITH DIFFERENTIAL/PLATELET
Absolute Monocytes: 867 cells/uL (ref 200–950)
Basophils Absolute: 18 cells/uL (ref 0–200)
Basophils Relative: 0.3 %
Eosinophils Absolute: 224 cells/uL (ref 15–500)
Eosinophils Relative: 3.8 %
HCT: 36.8 % (ref 35.0–45.0)
Hemoglobin: 12.6 g/dL (ref 11.7–15.5)
Lymphs Abs: 1092 cells/uL (ref 850–3900)
MCH: 31.8 pg (ref 27.0–33.0)
MCHC: 34.2 g/dL (ref 32.0–36.0)
MCV: 92.9 fL (ref 80.0–100.0)
MPV: 9.4 fL (ref 7.5–12.5)
Monocytes Relative: 14.7 %
Neutro Abs: 3699 cells/uL (ref 1500–7800)
Neutrophils Relative %: 62.7 %
Platelets: 211 10*3/uL (ref 140–400)
RBC: 3.96 10*6/uL (ref 3.80–5.10)
RDW: 13.5 % (ref 11.0–15.0)
Total Lymphocyte: 18.5 %
WBC: 5.9 10*3/uL (ref 3.8–10.8)

## 2022-08-23 LAB — TSH: TSH: 2.08 mIU/L (ref 0.40–4.50)

## 2022-08-23 LAB — BASIC METABOLIC PANEL WITH GFR
BUN: 23 mg/dL (ref 7–25)
CO2: 25 mmol/L (ref 20–32)
Calcium: 8.2 mg/dL — ABNORMAL LOW (ref 8.6–10.4)
Chloride: 106 mmol/L (ref 98–110)
Creat: 0.87 mg/dL (ref 0.60–0.95)
Glucose, Bld: 104 mg/dL — ABNORMAL HIGH (ref 65–99)
Potassium: 4 mmol/L (ref 3.5–5.3)
Sodium: 139 mmol/L (ref 135–146)
eGFR: 64 mL/min/{1.73_m2} (ref >=60)

## 2022-08-23 NOTE — Telephone Encounter (Signed)
   Primary Cardiologist: [name], MD  Chart reviewed as part of pre-operative protocol coverage. Given past medical history and time since last visit, based on ACC/AHA guidelines, Brenda [name] would be at acceptable risk for the planned procedure without further cardiovascular testing.   Patient was advised that if she develops new symptoms prior to surgery to contact our office to arrange a follow-up appointment.  He verbalized understanding.  Per Dr. Vivia Ewing note, patient may continue aspirin 81 mg in the perioperative setting.   I will route this recommendation to the requesting party via Epic fax function and remove from pre-op pool.  Please call with questions.  Emmaline Life, NP-C  08/23/2022, 12:20 PM [address] Office [phone] Fax [phone]

## 2022-08-23 NOTE — Telephone Encounter (Signed)
Left message for patient to call back for procedural clearance since it was not addressed during recent office visit

## 2022-08-23 NOTE — Telephone Encounter (Signed)
Received cardiac clearance. Patient's daughter Lynelle Smoke made aware. She verbalized understanding.

## 2022-09-01 ENCOUNTER — Non-Acute Institutional Stay: Payer: Medicare Other | Admitting: Internal Medicine

## 2022-09-01 ENCOUNTER — Encounter: Payer: Self-pay | Admitting: Internal Medicine

## 2022-09-01 VITALS — BP 128/72 | HR 76 | Temp 97.6°F | Resp 17 | Ht 64.0 in | Wt 147.0 lb

## 2022-09-01 DIAGNOSIS — E038 Other specified hypothyroidism: Secondary | ICD-10-CM

## 2022-09-01 DIAGNOSIS — Z952 Presence of prosthetic heart valve: Secondary | ICD-10-CM

## 2022-09-01 DIAGNOSIS — K2100 Gastro-esophageal reflux disease with esophagitis, without bleeding: Secondary | ICD-10-CM | POA: Diagnosis not present

## 2022-09-01 DIAGNOSIS — F419 Anxiety disorder, unspecified: Secondary | ICD-10-CM

## 2022-09-01 DIAGNOSIS — M545 Low back pain, unspecified: Secondary | ICD-10-CM | POA: Diagnosis not present

## 2022-09-01 DIAGNOSIS — N183 Chronic kidney disease, stage 3 unspecified: Secondary | ICD-10-CM | POA: Insufficient documentation

## 2022-09-01 DIAGNOSIS — R600 Localized edema: Secondary | ICD-10-CM | POA: Insufficient documentation

## 2022-09-01 DIAGNOSIS — I10 Essential (primary) hypertension: Secondary | ICD-10-CM | POA: Diagnosis not present

## 2022-09-01 DIAGNOSIS — M47816 Spondylosis without myelopathy or radiculopathy, lumbar region: Secondary | ICD-10-CM | POA: Insufficient documentation

## 2022-09-01 DIAGNOSIS — R609 Edema, unspecified: Secondary | ICD-10-CM | POA: Insufficient documentation

## 2022-09-01 DIAGNOSIS — M542 Cervicalgia: Secondary | ICD-10-CM | POA: Diagnosis not present

## 2022-09-01 DIAGNOSIS — Z8616 Personal history of COVID-19: Secondary | ICD-10-CM | POA: Insufficient documentation

## 2022-09-01 DIAGNOSIS — F32A Depression, unspecified: Secondary | ICD-10-CM | POA: Diagnosis not present

## 2022-09-01 DIAGNOSIS — R350 Frequency of micturition: Secondary | ICD-10-CM

## 2022-09-01 DIAGNOSIS — M1990 Unspecified osteoarthritis, unspecified site: Secondary | ICD-10-CM | POA: Insufficient documentation

## 2022-09-01 DIAGNOSIS — R197 Diarrhea, unspecified: Secondary | ICD-10-CM | POA: Diagnosis not present

## 2022-09-01 DIAGNOSIS — M4690 Unspecified inflammatory spondylopathy, site unspecified: Secondary | ICD-10-CM | POA: Insufficient documentation

## 2022-09-01 DIAGNOSIS — M5416 Radiculopathy, lumbar region: Secondary | ICD-10-CM | POA: Insufficient documentation

## 2022-09-01 DIAGNOSIS — Z87898 Personal history of other specified conditions: Secondary | ICD-10-CM | POA: Insufficient documentation

## 2022-09-01 DIAGNOSIS — Q253 Supravalvular aortic stenosis: Secondary | ICD-10-CM | POA: Insufficient documentation

## 2022-09-01 DIAGNOSIS — I2510 Atherosclerotic heart disease of native coronary artery without angina pectoris: Secondary | ICD-10-CM | POA: Insufficient documentation

## 2022-09-01 DIAGNOSIS — I5189 Other ill-defined heart diseases: Secondary | ICD-10-CM | POA: Insufficient documentation

## 2022-09-01 DIAGNOSIS — M48061 Spinal stenosis, lumbar region without neurogenic claudication: Secondary | ICD-10-CM | POA: Insufficient documentation

## 2022-09-01 DIAGNOSIS — G8929 Other chronic pain: Secondary | ICD-10-CM

## 2022-09-01 DIAGNOSIS — E7800 Pure hypercholesterolemia, unspecified: Secondary | ICD-10-CM | POA: Insufficient documentation

## 2022-09-01 DIAGNOSIS — N189 Chronic kidney disease, unspecified: Secondary | ICD-10-CM | POA: Insufficient documentation

## 2022-09-01 DIAGNOSIS — E785 Hyperlipidemia, unspecified: Secondary | ICD-10-CM | POA: Diagnosis not present

## 2022-09-01 DIAGNOSIS — M47812 Spondylosis without myelopathy or radiculopathy, cervical region: Secondary | ICD-10-CM | POA: Insufficient documentation

## 2022-09-01 DIAGNOSIS — R300 Dysuria: Secondary | ICD-10-CM | POA: Insufficient documentation

## 2022-09-01 DIAGNOSIS — R509 Fever, unspecified: Secondary | ICD-10-CM | POA: Insufficient documentation

## 2022-09-01 DIAGNOSIS — E778 Other disorders of glycoprotein metabolism: Secondary | ICD-10-CM | POA: Insufficient documentation

## 2022-09-01 DIAGNOSIS — E042 Nontoxic multinodular goiter: Secondary | ICD-10-CM | POA: Insufficient documentation

## 2022-09-01 DIAGNOSIS — E8809 Other disorders of plasma-protein metabolism, not elsewhere classified: Secondary | ICD-10-CM | POA: Insufficient documentation

## 2022-09-01 DIAGNOSIS — E039 Hypothyroidism, unspecified: Secondary | ICD-10-CM | POA: Insufficient documentation

## 2022-09-01 NOTE — Progress Notes (Signed)
Location:  [place] of Service:  Clinic (12)  Provider:   Code Status:  Goals of Care:     09/01/2022    1:25 PM  Advanced Directives  Does Patient Have a Medical Advance Directive? Yes  Type of Advance Directive Living will;Healthcare Power of [place];Out of facility DNR (pink MOST or yellow form)  Does patient want to make changes to medical advance directive? No - Patient declined  Copy of [place] in Chart? No - copy requested     Chief Complaint  Patient presents with   Medical Management of Chronic Issues    2 month follow up   Quality Metric Gaps    Discussed the need for Dexa scan and AWV    HPI: Patient is a 89 y.o. female seen today for medical management of chronic diseases.    Lives in [place] in [place]   Urinary frequency No Fever or Dysuria Feels her symptoms are better and does not want to try new med   Diarrhea Most likely IBS  Getting work up by GI  Anxiety Did not tolerate Lexapro ? Diarrhea Takes Xanax Prn Daughter thinks most of her issues are related to Anxiety Did never take Buspar    history of TAVR in 11/2018   GE reflux disease with Barrett's Symptoms were better with Prilosec  EGD with GI pending Hypertension Better now Stopped melatonin Skin cancer s/p Mohs surgery Says she is allergic to Shingrix and not suppose to take Covid Booster   Has pain in her neck and lower back due to degenerative disease. Continues to be her issue but manages well with Tylenol She has seen Dr. Ortencia Kick.  Has not tolerated gabapentin in the past. He suggested physical therapy Cannot do MRI due to Anterior plate  in her neck  Did not tolerate Cymbalta or Neurontin  CT scan did show  Advanced cervical spine degeneration with solid arthrodesis at C5-6 and facet ankylosis at C2-C4.With Foraminal Narrowing  seen Neurosurgery before Daughter and Patient recently Patient refused Spinal Injection   Past  Medical History:  Diagnosis Date   Anxiety    Asthma    Barrett's esophagus    Cancer ([place])    DJD (degenerative joint disease)    GERD (gastroesophageal reflux disease)    Goiter    Hemorrhoids    History of colonoscopy    History of shingles    face/eye   History of subdural hematoma    History of traumatic subdural hematoma    Hx of colonic polyps    Hyperlipidemia    Hypertension    IBS (irritable bowel syndrome)    Osteoarthritis    S/P TAVR (transcatheter aortic valve replacement)    23 mm Edwards Sapien 3 transcatheter heart valve placed via percutaneous right transfemoral approach    Sarcoidosis    Sicca ([place])     Past Surgical History:  Procedure Laterality Date   BREAST LUMPECTOMY  1974   left   CATARACT EXTRACTION W/ INTRAOCULAR LENS  IMPLANT, BILATERAL     CRANIOTOMY Right 01/04/2014   Procedure: CRANIOTOMY HEMATOMA EVACUATION SUBDURAL;  Surgeon: [name], MD;  Location: [facility];  Service: Neurosurgery;  Laterality: Right;   CRANIOTOMY N/A 01/08/2014   Procedure: Redo CRANIOTOMY HEMATOMA EVACUATION SUBDURAL RIGHT;  Surgeon: [name], MD;  Location: [place] [facility];  Service: Neurosurgery;  Laterality: N/A;  Redo CRANIOTOMY HEMATOMA EVACUATION SUBDURAL RIGHT  CRANIOTOMY Right 01/10/2014   Procedure: Redo CRANIOTOMY HEMATOMA EVACUATION SUBDURAL;  Surgeon: [name], MD;  Location: [place] [facility];  Service: Neurosurgery;  Laterality: Right;   DILATION AND CURETTAGE OF UTERUS     EYE SURGERY     NASAL SINUS SURGERY     NECK SURGERY     x 2   RIGHT/LEFT HEART CATH AND CORONARY ANGIOGRAPHY N/A 09/27/2018   Procedure: RIGHT/LEFT HEART CATH AND CORONARY ANGIOGRAPHY;  Surgeon: Burnell Blanks, MD;  Location: [facility];  Service: Cardiovascular;  Laterality: N/A;   TEE WITHOUT CARDIOVERSION N/A 11/28/2018   Procedure: TRANSESOPHAGEAL ECHOCARDIOGRAM (TEE);  Surgeon: Sherren Mocha, MD;  Location: [place];  Service: Open Heart Surgery;   Laterality: N/A;   TONSILLECTOMY AND ADENOIDECTOMY  1976   TOTAL THYROIDECTOMY  06-05-08   TRANSCATHETER AORTIC VALVE REPLACEMENT, TRANSFEMORAL N/A 11/28/2018   Procedure: TRANSCATHETER AORTIC VALVE REPLACEMENT, TRANSFEMORAL;  Surgeon: Sherren Mocha, MD;  Location: [facility];  Service: Open Heart Surgery;  Laterality: N/A;    Allergies  Allergen Reactions   Losartan Potassium Other (See Comments)   Zoster Vaccine Live Other (See Comments)    Outpatient Encounter Medications as of 09/01/2022  Medication Sig   acetaminophen (TYLENOL) 650 MG CR tablet Take 650 mg by mouth every 8 (eight) hours.   ALPRAZolam (XANAX) 0.5 MG tablet TAKE 2 TABLETS(1 MG) BY MOUTH AT BEDTIME   amLODipine (NORVASC) 5 MG tablet TAKE 1 TABLET(5 MG) BY MOUTH DAILY   amoxicillin (AMOXIL) 500 MG tablet TAKE 4 TABSULES ONE HOUR PRIOR TO ALL DENTAL VISITS   aspirin 81 MG chewable tablet Chew 1 tablet (81 mg total) by mouth daily.   carvedilol (COREG) 12.5 MG tablet Take 1 tablet (12.5 mg total) by mouth 2 (two) times daily with a meal.   cycloSPORINE (RESTASIS) 0.05 % ophthalmic emulsion Place 1 drop into both eyes every 12 (twelve) hours.   levothyroxine (SYNTHROID) 75 MCG tablet TAKE 1 TABLET(75 MCG) BY MOUTH DAILY   pantoprazole (PROTONIX) 40 MG tablet Take 1 tablet (40 mg total) by mouth daily.   Zinc 50 MG CAPS Take 1 capsule by mouth daily as needed.   [DISCONTINUED] busPIRone (BUSPAR) 5 MG tablet Take 1 tablet (5 mg total) by mouth 2 (two) times daily. (Patient not taking: Reported on 08/17/2022)   [DISCONTINUED] dicyclomine (BENTYL) 20 MG tablet Take 1 tablet (20 mg total) by mouth 2 (two) times daily. (Patient not taking: Reported on 08/17/2022)   [DISCONTINUED] Homeopathic Products (LEG CRAMP COMPLEX PO) Take 1 capsule by mouth daily. (Patient not taking: Reported on 09/01/2022)   [DISCONTINUED] ondansetron (ZOFRAN) 4 MG tablet Take 1 tablet (4 mg total) by mouth every 6 (six) hours. (Patient not taking: Reported on  08/17/2022)   No facility-administered encounter medications on file as of 09/01/2022.    Review of Systems:  Review of Systems  Constitutional:  Negative for activity change and appetite change.  HENT: Negative.    Respiratory:  Negative for cough and shortness of breath.   Cardiovascular:  Negative for leg swelling.  Gastrointestinal:  Negative for constipation.  Genitourinary: Negative.   Musculoskeletal:  Positive for back pain. Negative for arthralgias, gait problem and myalgias.  Skin: Negative.   Neurological:  Negative for dizziness and weakness.  Psychiatric/Behavioral:  Positive for confusion. Negative for dysphoric mood and sleep disturbance. The patient is nervous/anxious.     Health Maintenance  Topic Date Due   DEXA SCAN  Never done   Medicare Annual  Wellness (AWV)  03/14/2021   COVID-19 Vaccine (1) 09/05/2027 (Originally 08/25/1931)   INFLUENZA VACCINE  10/24/2027 (Originally 02/23/2022)   Zoster Vaccines- Shingrix (1 of 2) 11/18/2027 (Originally 02/23/1983)   DTaP/Tdap/Td (4 - Td or Tdap) 12/19/2023   Pneumonia Vaccine 65+ Years old  Completed   HPV VACCINES  Aged Out    Physical Exam: Vitals:   09/01/22 1318  BP: 128/72  Pulse: 76  Resp: 17  Temp: 97.6 F (36.4 C)  TempSrc: Temporal  SpO2: 96%  Weight: 147 lb (66.7 kg)  Height: 5' 4"$  (1.626 m)   Body mass index is 25.23 kg/m. Physical Exam Vitals reviewed.  Constitutional:      Appearance: Normal appearance.  HENT:     Head: Normocephalic.     Nose: Nose normal.     Mouth/Throat:     Mouth: Mucous membranes are moist.     Pharynx: Oropharynx is clear.  Eyes:     Pupils: Pupils are equal, round, and reactive to light.  Cardiovascular:     Rate and Rhythm: Normal rate and regular rhythm.     Pulses: Normal pulses.     Heart sounds: Murmur heard.  Pulmonary:     Effort: Pulmonary effort is normal.     Breath sounds: Normal breath sounds.  Abdominal:     General: Abdomen is flat. Bowel sounds  are normal.     Palpations: Abdomen is soft.  Musculoskeletal:        General: No swelling.     Cervical back: Neck supple.  Skin:    General: Skin is warm.  Neurological:     General: No focal deficit present.     Mental Status: She is alert and oriented to person, place, and time.  Psychiatric:        Mood and Affect: Mood normal.        Thought Content: Thought content normal.     Labs reviewed: Basic Metabolic Panel: Recent Labs    10/05/21 0845 07/16/22 1307 07/16/22 1318 08/23/22 0754  NA 140 141  --  139  K 3.8 3.4*  --  4.0  CL 104 103  --  106  CO2 26 21*  --  25  GLUCOSE 90 151*  --  104*  BUN 24 32*  --  23  CREATININE 0.90 0.86  --  0.87  CALCIUM 8.9 7.9*  --  8.2*  MG  --   --  1.8  --   TSH 4.20  --   --  2.08   Liver Function Tests: Recent Labs    10/05/21 0845 07/16/22 1307  AST 16 46*  ALT 13 51*  ALKPHOS  --  48  BILITOT 0.6 1.0  PROT 6.6 6.7  ALBUMIN  --  4.0   Recent Labs    07/16/22 1307  LIPASE 105*   No results for input(s): "AMMONIA" in the last 8760 hours. CBC: Recent Labs    10/05/21 0845 07/16/22 1307 08/23/22 0754  WBC 6.5 9.2 5.9  NEUTROABS 4,206  --  3,699  HGB 13.6 17.1* 12.6  HCT 40.4 50.1* 36.8  MCV 93.1 95.1 92.9  PLT 170 147* 211   Lipid Panel: Recent Labs    10/05/21 0845 08/23/22 0754  CHOL 161 245*  HDL 61 56  LDLCALC 79 162*  TRIG 112 143  CHOLHDL 2.6 4.4   Lab Results  Component Value Date   HGBA1C 5.4 11/23/2018    Procedures since last visit: No results  found.  Assessment/Plan 1. Essential hypertension Doing well on Coreg and Norvasc  2. Hyperlipidemia, unspecified hyperlipidemia type Had stopped Crestor herself during time of her GI sickness LDL has gone up D/W daughter She will restart it If has any issues with Myalgias will let me know  3. Other specified hypothyroidism TSH normal in 01/24  4. Gastroesophageal reflux disease with esophagitis, unspecified whether  hemorrhage On Prilosec Plan for EGD per GI  5. Diarrhea, unspecified type IBS Resolved right now Colonoscopy per GI  6. Chronic bilateral low back pain without sciatica Tylenol  as has not tolerated Gabapentin  Don't want to try Tramadol right now Refused Spinal Injection per Neuro surgery  7. Chronic neck pain   8. Frequent urination Symptoms better right now  9. Anxiety and depression Xanax Did not tolerate Lexapro and Never took Buspar  10. S/P TAVR (transcatheter aortic valve replacement) Follows with Cardiology 11 CAD Aspirin Restart Statin  Labs/tests ordered:  * No order type specified * Next appt:  Visit date not found

## 2022-09-06 ENCOUNTER — Telehealth: Payer: Self-pay

## 2022-09-06 MED ORDER — ALPRAZOLAM 0.5 MG PO TABS: 0.5000 mg | ORAL_TABLET | Freq: Every morning | ORAL | 0 refills | Status: DC

## 2022-09-06 NOTE — Telephone Encounter (Signed)
I will add the Xanax in the morning .She does take one tablet in am and two in evening

## 2022-09-06 NOTE — Telephone Encounter (Signed)
Message left on clinical intake voicemail:   Patient needs a new rx sent to the pharmacy for alprazolam. RX was last filled for # 33 and patients daughter believes this was done in error. Per [name] the provider went up on alprazolam dose and patient is taking 1 tablet by mouth in the am and 2 by mouth in the pm.   [name] is requesting a new rx to reflect these instructions and with a dispense number of 90.  It appears rx was changed on 08/22/22 when Amy refilled, when patient was seen on 09/01/2022 there was no indication to change it back after medication list was reviewed by the medical assistant and provider  Please advise

## 2022-09-07 MED ORDER — ALPRAZOLAM 0.5 MG PO TABS: ORAL_TABLET | ORAL | 0 refills | Status: DC

## 2022-09-07 NOTE — Telephone Encounter (Signed)
Patient daughter, [name], called and stated that she spoke with [facility] and insurance will NOT pay for the 2 Rx's written septately.  [facility] stated that a whole new Rx needed to be written stating that patient takes Xanax one in the morning and 2 at night in order for patient's insurance to cover.   Rx updated and sent to Dr. Lyndel Safe for approval.

## 2022-09-07 NOTE — Addendum Note (Signed)
Addended by: Rafael Bihari A on: 09/07/2022 02:45 PM   Modules accepted: Orders

## 2022-09-07 NOTE — Addendum Note (Signed)
Addended by: Georgina Snell on: 09/07/2022 02:52 PM   Modules accepted: Orders

## 2022-09-21 ENCOUNTER — Telehealth: Payer: Self-pay | Admitting: Gastroenterology

## 2022-09-21 DIAGNOSIS — R6881 Early satiety: Secondary | ICD-10-CM

## 2022-09-21 DIAGNOSIS — K219 Gastro-esophageal reflux disease without esophagitis: Secondary | ICD-10-CM

## 2022-09-21 DIAGNOSIS — R142 Eructation: Secondary | ICD-10-CM

## 2022-09-21 NOTE — Addendum Note (Signed)
Addended by: Yevette Edwards on: 09/21/2022 03:57 PM   Modules accepted: Orders

## 2022-09-21 NOTE — Telephone Encounter (Signed)
Returned call to [place]. [name] informed me that the patient stated that she can't drink the prep for the colonoscopy and her diarrhea has improved. Pt is mainly concerned with her upper GI symptoms and prefers to have EGD only. I informed [name] that we can change the appt to EGD only and I would update Dr. Loletha Grayer and let him know patient's decision. [name] confirmed that she does have access to patient's MyChart. [name] is aware that I will send EGD instructions via MyChart for their review. [name] verbalized understanding and had no concerns at the end of the call.  EGD instructions sent to patient via [place]. Message sent to pre-certification team since patient is now only having an EGD.

## 2022-09-21 NOTE — Telephone Encounter (Signed)
Inbound call from pt daughter want to speak with a nurse regarding appt on 3/6 , [name] stated that her mom is having a lot digestion issues/burping and dont think she can take medication .Marland KitchenMarland KitchenPlease advise

## 2022-09-29 ENCOUNTER — Ambulatory Visit (AMBULATORY_SURGERY_CENTER): Payer: Medicare Other | Admitting: Gastroenterology

## 2022-09-29 ENCOUNTER — Encounter: Payer: Self-pay | Admitting: Gastroenterology

## 2022-09-29 ENCOUNTER — Encounter: Payer: Medicare Other | Admitting: Gastroenterology

## 2022-09-29 VITALS — BP 154/58 | HR 56 | Temp 98.4°F | Resp 11 | Ht 64.0 in | Wt 145.0 lb

## 2022-09-29 DIAGNOSIS — K2990 Gastroduodenitis, unspecified, without bleeding: Secondary | ICD-10-CM

## 2022-09-29 DIAGNOSIS — R6881 Early satiety: Secondary | ICD-10-CM

## 2022-09-29 DIAGNOSIS — K319 Disease of stomach and duodenum, unspecified: Secondary | ICD-10-CM | POA: Diagnosis not present

## 2022-09-29 DIAGNOSIS — K2980 Duodenitis without bleeding: Secondary | ICD-10-CM | POA: Diagnosis not present

## 2022-09-29 DIAGNOSIS — E785 Hyperlipidemia, unspecified: Secondary | ICD-10-CM | POA: Diagnosis not present

## 2022-09-29 DIAGNOSIS — J45909 Unspecified asthma, uncomplicated: Secondary | ICD-10-CM | POA: Diagnosis not present

## 2022-09-29 DIAGNOSIS — K219 Gastro-esophageal reflux disease without esophagitis: Secondary | ICD-10-CM | POA: Diagnosis not present

## 2022-09-29 DIAGNOSIS — I10 Essential (primary) hypertension: Secondary | ICD-10-CM | POA: Diagnosis not present

## 2022-09-29 DIAGNOSIS — K2970 Gastritis, unspecified, without bleeding: Secondary | ICD-10-CM | POA: Diagnosis not present

## 2022-09-29 DIAGNOSIS — K2289 Other specified disease of esophagus: Secondary | ICD-10-CM | POA: Diagnosis not present

## 2022-09-29 DIAGNOSIS — R142 Eructation: Secondary | ICD-10-CM

## 2022-09-29 DIAGNOSIS — F419 Anxiety disorder, unspecified: Secondary | ICD-10-CM | POA: Diagnosis not present

## 2022-09-29 MED ORDER — PANTOPRAZOLE SODIUM 40 MG PO TBEC: 40.0000 mg | DELAYED_RELEASE_TABLET | Freq: Two times a day (BID) | ORAL | 3 refills | Status: DC

## 2022-09-29 MED ORDER — SODIUM CHLORIDE 0.9 % IV SOLN: 500.0000 mL | Freq: Once | INTRAVENOUS | Status: DC

## 2022-09-29 NOTE — Progress Notes (Unsigned)
GASTROENTEROLOGY PROCEDURE H&P NOTE   Primary Care Physician: Virgie Dad, MD    Reason for Procedure:   Abdominal bloating, early satiety, change in bowel habits, GERD w/ h/o SSND Barrett's  Plan:    EGD  Patient is appropriate for endoscopic procedure(s) in the ambulatory ([place]) setting.  The nature of the procedure, as well as the risks, benefits, and alternatives were carefully and thoroughly reviewed with the patient. Ample time for discussion and questions allowed. The patient understood, was satisfied, and agreed to proceed.     HPI: Brenda [name] is a 89 y.o. female who presents for EGD for evaluation of 6 months of Abdominal bloating, early satiety, change in bowel habits, along with hx of GERD w/ h/o SSND Barrett's  Endoscopic History: - 08/15/2008: Short segment Barrett's Esophagus (path: Intestinal metaplasia), multiple antral ulcers.  Esophagitis - 07/24/2010: Colonoscopy: Melanosis coli, 4 mm flat polyp (path: Tubular adenoma), Otherwise normal.  Repeat 5 years - 07/24/2010: EGD: Irregular Z-line (path: Inflammation without intestinal metaplasia), Otherwise normal  Past Medical History:  Diagnosis Date   Allergy    Anxiety    Asthma    Barrett's esophagus    Cancer ([place])    skin cancer on forehead and nose   DJD (degenerative joint disease)    GERD (gastroesophageal reflux disease)    Goiter    Hemorrhoids    History of colonoscopy    History of shingles    face/eye   History of subdural hematoma    History of traumatic subdural hematoma    Hx of colonic polyps    Hyperlipidemia    Hypertension    IBS (irritable bowel syndrome)    Osteoarthritis    S/P TAVR (transcatheter aortic valve replacement)    23 mm Edwards Sapien 3 transcatheter heart valve placed via percutaneous right transfemoral approach    Sarcoidosis    Sicca ([place])    Sleep apnea    per pt this is resolved, does not use cpap    Past Surgical History:  Procedure Laterality  Date   BREAST LUMPECTOMY  1974   left   CATARACT EXTRACTION W/ INTRAOCULAR LENS  IMPLANT, BILATERAL     CRANIOTOMY Right 01/04/2014   Procedure: CRANIOTOMY HEMATOMA EVACUATION SUBDURAL;  Surgeon: [name], MD;  Location: [facility];  Service: Neurosurgery;  Laterality: Right;   CRANIOTOMY N/A 01/08/2014   Procedure: Redo CRANIOTOMY HEMATOMA EVACUATION SUBDURAL RIGHT;  Surgeon: [name], MD;  Location: [facility];  Service: Neurosurgery;  Laterality: N/A;  Redo CRANIOTOMY HEMATOMA EVACUATION SUBDURAL RIGHT   CRANIOTOMY Right 01/10/2014   Procedure: Redo CRANIOTOMY HEMATOMA EVACUATION SUBDURAL;  Surgeon: [name], MD;  Location: [facility];  Service: Neurosurgery;  Laterality: Right;   DILATION AND CURETTAGE OF UTERUS     EYE SURGERY     NASAL SINUS SURGERY     NECK SURGERY     x 2   RIGHT/LEFT HEART CATH AND CORONARY ANGIOGRAPHY N/A 09/27/2018   Procedure: RIGHT/LEFT HEART CATH AND CORONARY ANGIOGRAPHY;  Surgeon: Burnell Blanks, MD;  Location: [facility];  Service: Cardiovascular;  Laterality: N/A;   TEE WITHOUT CARDIOVERSION N/A 11/28/2018   Procedure: TRANSESOPHAGEAL ECHOCARDIOGRAM (TEE);  Surgeon: Sherren Mocha, MD;  Location: [facility];  Service: Open Heart Surgery;  Laterality: N/A;   TONSILLECTOMY AND ADENOIDECTOMY  1976   TOTAL THYROIDECTOMY  06/05/2008   TRANSCATHETER AORTIC VALVE REPLACEMENT, TRANSFEMORAL N/A 11/28/2018   Procedure: TRANSCATHETER AORTIC VALVE  REPLACEMENT, TRANSFEMORAL;  Surgeon: Sherren Mocha, MD;  Location: [facility];  Service: Open Heart Surgery;  Laterality: N/A;   UPPER GASTROINTESTINAL ENDOSCOPY      Prior to Admission medications   Medication Sig Start Date End Date Taking? Authorizing Provider  acetaminophen (TYLENOL) 650 MG CR tablet Take 650 mg by mouth every 8 (eight) hours.   Yes [provider]  ALPRAZolam Duanne Moron) 0.5 MG tablet Take one tablet by mouth in the morning and take two tablets by mouth at  bedtime. 09/07/22  Yes Virgie Dad, MD  amLODipine (NORVASC) 5 MG tablet TAKE 1 TABLET(5 MG) BY MOUTH DAILY 01/11/22  Yes Virgie Dad, MD  aspirin 81 MG chewable tablet Chew 1 tablet (81 mg total) by mouth daily. 11/29/18  Yes Eileen Stanford, PA-C  carvedilol (COREG) 12.5 MG tablet Take 1 tablet (12.5 mg total) by mouth 2 (two) times daily with a meal. 06/30/22  Yes Virgie Dad, MD  Cholecalciferol (VITAMIN D-3) 25 MCG (1000 UT) CAPS Take 1,000 Units by mouth daily.   Yes [provider]  cycloSPORINE (RESTASIS) 0.05 % ophthalmic emulsion Place 1 drop into both eyes every 12 (twelve) hours.   Yes [provider]  levothyroxine (SYNTHROID) 75 MCG tablet TAKE 1 TABLET(75 MCG) BY MOUTH DAILY 07/28/22  Yes Virgie Dad, MD  MULTIPLE VITAMIN PO Take by mouth.   Yes [provider]  pantoprazole (PROTONIX) 40 MG tablet Take 1 tablet (40 mg total) by mouth daily. 06/30/22  Yes Virgie Dad, MD  rosuvastatin (CRESTOR) 5 MG tablet Take 5 mg by mouth daily.   Yes [provider]  Zinc 50 MG CAPS Take 1 capsule by mouth daily as needed.   Yes [provider]  amoxicillin (AMOXIL) 500 MG tablet TAKE 4 TABSULES ONE HOUR PRIOR TO ALL DENTAL VISITS 03/10/21   Eileen Stanford, PA-C    Current Outpatient Medications  Medication Sig Dispense Refill   acetaminophen (TYLENOL) 650 MG CR tablet Take 650 mg by mouth every 8 (eight) hours.     ALPRAZolam (XANAX) 0.5 MG tablet Take one tablet by mouth in the morning and take two tablets by mouth at bedtime. 90 tablet 0   amLODipine (NORVASC) 5 MG tablet TAKE 1 TABLET(5 MG) BY MOUTH DAILY 90 tablet 2   aspirin 81 MG chewable tablet Chew 1 tablet (81 mg total) by mouth daily.     carvedilol (COREG) 12.5 MG tablet Take 1 tablet (12.5 mg total) by mouth 2 (two) times daily with a meal. 60 tablet 3   Cholecalciferol (VITAMIN D-3) 25 MCG (1000 UT) CAPS Take 1,000 Units by mouth daily.     cycloSPORINE (RESTASIS)  0.05 % ophthalmic emulsion Place 1 drop into both eyes every 12 (twelve) hours.     levothyroxine (SYNTHROID) 75 MCG tablet TAKE 1 TABLET(75 MCG) BY MOUTH DAILY 90 tablet 1   MULTIPLE VITAMIN PO Take by mouth.     pantoprazole (PROTONIX) 40 MG tablet Take 1 tablet (40 mg total) by mouth daily. 60 tablet 3   rosuvastatin (CRESTOR) 5 MG tablet Take 5 mg by mouth daily.     Zinc 50 MG CAPS Take 1 capsule by mouth daily as needed.     amoxicillin (AMOXIL) 500 MG tablet TAKE 4 TABSULES ONE HOUR PRIOR TO ALL DENTAL VISITS 8 tablet 12   Current Facility-Administered Medications  Medication Dose Route Frequency Provider Last Rate Last Admin   0.9 %  sodium chloride infusion  500 mL Intravenous Once Derriona Branscom V, DO        Allergies as of 09/29/2022 - Review Complete 09/29/2022  Allergen Reaction Noted   Losartan potassium Other (See Comments) 06/18/2020   Zoster vaccine live Other (See Comments) 06/18/2020    Family History  Problem Relation Age of Onset   Hypertension Mother    Hyperlipidemia Mother    Kidney cancer Mother    Lymphoma Mother    Hypertension Father    Hyperlipidemia Father    Rheum arthritis Father    Heart failure Father    Heart disease Father    Hypertension Sister    Allergies Sister    Asthma Sister    Allergies Sister    Thyroid cancer Sister    Allergies Sister    Colon cancer Sister    Allergies Sister    Hypertension Brother    Kidney cancer Maternal Grandmother    Liver disease Neg Hx    Esophageal cancer Neg Hx    Rectal cancer Neg Hx    Stomach cancer Neg Hx     Social History   Socioeconomic History   Marital status: Widowed    Spouse name: Not on file   Number of children: 4   Years of education: Not on file   Highest education level: Not on file  Occupational History   Occupation: Retired    Fish farm manager: RETIRED    Comment: worked for [employer] Use   Smoking status: Never   Smokeless tobacco: Never  Vaping  Use   Vaping Use: Never used  Substance and Sexual Activity   Alcohol use: Yes    Comment: Occ glass of wine with dinner   Drug use: No   Sexual activity: Not on file  Other Topics Concern   Not on file  Social History Narrative   Never used tobacco.   Drinks 4 glasses wine per week   Patient does consume caffeine.   Lives in apartment at [facility] alone   [place], highest level completed.   Past profession was accounting   Patient does exercise-stretches each morning and yoga   Patient has living will and health care power of attorney, no DNR   Social Determinants of Health   Financial Resource Strain: Not on file  Food Insecurity: Not on file  Transportation Needs: Not on file  Physical Activity: Not on file  Stress: Not on file  Social Connections: Not on file  Intimate Partner Violence: Not on file    Physical Exam: Vital signs in last 24 hours: '@BP'$  137/84   Pulse 70   Temp 98.4 F (36.9 C) (Temporal)   Ht '5\' 4"'$  (1.626 m)   Wt 145 lb (65.8 kg)   SpO2 97%   BMI 24.89 kg/m  GEN: NAD EYE: Sclerae anicteric ENT: MMM CV: Non-tachycardic Pulm: CTA b/l GI: Soft, NT/ND NEURO:  Alert & Oriented x 3   Gerrit Heck, DO [facility]   09/29/2022 4:08 PM

## 2022-09-29 NOTE — Progress Notes (Unsigned)
Called to room to assist during endoscopic procedure.  Patient ID and intended procedure confirmed with present staff. Received instructions for my participation in the procedure from the performing physician.  

## 2022-09-29 NOTE — Patient Instructions (Addendum)
- Patient has a contact number available for                            emergencies. The signs and symptoms of potential                            delayed complications were discussed with the                            patient. Return to normal activities tomorrow.                            Written discharge instructions were provided to the                            patient.                           - Resume previous diet.                           - Continue present medications.                           - Await pathology results.                           - Increase Protonix (pantoprazole) 40 mg PO BID for                            4 weeks, then reduce back to 40 mg daily.                           - Return to GI clinic at appointment to be                            scheduled.                           - If continued symptoms, could consider HIDA scan                            or possibly trial of Rifaxmin.  YOU HAD AN ENDOSCOPIC PROCEDURE TODAY AT [facility]:   Refer to the procedure report that was given to you for any specific questions about what was found during the examination.  If the procedure report does not answer your questions, please call your gastroenterologist to clarify.  If you requested that your care partner not be given the details of your procedure findings, then the procedure report has been included in a sealed envelope for you to review at your convenience later.  YOU SHOULD EXPECT: Some feelings of bloating in the abdomen. Passage of more gas than usual.  Walking can help get rid of the air that was put into your GI tract during the procedure and reduce the bloating. If you had a lower endoscopy (such as a colonoscopy or flexible sigmoidoscopy)  you may notice spotting of blood in your stool or on the toilet paper. If you underwent a bowel prep for your procedure, you may not have a normal bowel movement for a few days.  Please Note:  You might  notice some irritation and congestion in your nose or some drainage.  This is from the oxygen used during your procedure.  There is no need for concern and it should clear up in a day or so.  SYMPTOMS TO REPORT IMMEDIATELY:   Following upper endoscopy (EGD)  Vomiting of blood or coffee ground material  New chest pain or pain under the shoulder blades  Painful or persistently difficult swallowing  New shortness of breath  Fever of 100F or higher  Black, tarry-looking stools  For urgent or emergent issues, a gastroenterologist can be reached at any hour by calling [phone]. Do not use MyChart messaging for urgent concerns.    DIET:  We do recommend a small meal at first, but then you may proceed to your regular diet.  Drink plenty of fluids but you should avoid alcoholic beverages for 24 hours.  ACTIVITY:  You should plan to take it easy for the rest of today and you should NOT DRIVE or use heavy machinery until tomorrow (because of the sedation medicines used during the test).    FOLLOW UP: Our staff will call the number listed on your records the next business day following your procedure.  We will call around 7:15- 8:00 am to check on you and address any questions or concerns that you may have regarding the information given to you following your procedure. If we do not reach you, we will leave a message.     If any biopsies were taken you will be contacted by phone or by letter within the next 1-3 weeks.  Please call us at [phone] if you have not heard about the biopsies in 3 weeks.    SIGNATURES/CONFIDENTIALITY: You and/or your care partner have signed paperwork which will be entered into your electronic medical record.  These signatures attest to the fact that that the information above on your After Visit Summary has been reviewed and is understood.  Full responsibility of the confidentiality of this discharge information lies with you and/or your care-partner.

## 2022-09-29 NOTE — Progress Notes (Unsigned)
Vss nad trans to pacu °

## 2022-09-29 NOTE — Op Note (Signed)
[facility] Patient Name: Brenda [name] Procedure Date: 09/29/2022 4:15 PM MRN: [id number] Endoscopist: Gerrit Heck , MD, [id number] Age: 89 Referring MD:  Date of Birth: 12/17/1930 Gender: Female Account #: [id number] Procedure:                Upper GI endoscopy Indications:              Esophageal reflux, Surveillance for malignancy due                            to personal history of Barrett's esophagus,                            Abdominal bloating, Early satiety, Eructation                           Endoscopic History:                           ?" 08/15/2008: Short segment Barrett's Esophagus                            (path: Intestinal metaplasia), multiple antral                            ulcers. Esophagitis                           ?" 07/24/2010: EGD: Irregular Z-line (path:                            Inflammation without intestinal metaplasia),                            Otherwise normal Medicines:                Monitored Anesthesia Care Procedure:                Pre-Anesthesia Assessment:                           - Prior to the procedure, a History and Physical                            was performed, and patient medications and                            allergies were reviewed. The patient's tolerance of                            previous anesthesia was also reviewed. The risks                            and benefits of the procedure and the sedation                            options and risks were discussed with the patient.  All questions were answered, and informed consent                            was obtained. Prior Anticoagulants: The patient has                            taken no anticoagulant or antiplatelet agents                            except for aspirin. ASA Grade Assessment: III - A                            patient with severe systemic disease. After                            reviewing the risks and  benefits, the patient was                            deemed in satisfactory condition to undergo the                            procedure.                           After obtaining informed consent, the endoscope was                            passed under direct vision. Throughout the                            procedure, the patient's blood pressure, pulse, and                            oxygen saturations were monitored continuously. The                            GIF [id number] was introduced through the                            mouth, and advanced to the second part of duodenum.                            The upper GI endoscopy was accomplished without                            difficulty. The patient tolerated the procedure                            well. Scope In: Scope Out: Findings:                 The upper third of the esophagus, middle third of                            the esophagus and lower  third of the esophagus were                            normal.                           The Z-line was slightly irregular and was found 34                            cm from the incisors. There was 1 tongue of                            salmon-colored mucosa extending 0.5 cm. Otherwise,                            no nodules or other worrisome features. Biopsies                            were taken with a cold forceps for histology.                            Estimated blood loss was minimal.                           Mild inflammation characterized by erythema was                            found in the gastric body and in the gastric                            antrum. Biopsies were taken with a cold forceps for                            Helicobacter pylori testing. Estimated blood loss                            was minimal. The pylorus was patent and easily                            traversed.                           The examined duodenum was normal. Biopsies were                             taken with a cold forceps for histology. Estimated                            blood loss was minimal. Complications:            No immediate complications. Estimated Blood Loss:     Estimated blood loss was minimal. Impression:               - Normal upper third of esophagus, middle third of  esophagus and lower third of esophagus.                           - Z-line irregular, 34 cm from the incisors.                            Biopsied.                           - Gastritis. Biopsied.                           - Normal examined duodenum. Biopsied. Recommendation:           - Patient has a contact number available for                            emergencies. The signs and symptoms of potential                            delayed complications were discussed with the                            patient. Return to normal activities tomorrow.                            Written discharge instructions were provided to the                            patient.                           - Resume previous diet.                           - Continue present medications.                           - Await pathology results.                           - Increase Protonix (pantoprazole) 40 mg PO BID for                            4 weeks, then reduce back to 40 mg daily.                           - Return to GI clinic at appointment to be                            scheduled.                           - If continued symptoms, could consider HIDA scan                            or possibly trial of Rifaxmin. [employer]  Danissa Rundle, MD 09/29/2022 4:32:45 PM

## 2022-09-30 ENCOUNTER — Telehealth: Payer: Self-pay | Admitting: *Deleted

## 2022-09-30 NOTE — Telephone Encounter (Signed)
  Follow up Call-     09/29/2022    3:21 PM  Call back number  Post procedure Call Back phone  # [phone]  Permission to leave phone message Yes     Patient questions:  Do you have a fever, pain , or abdominal swelling? No. Pain Score  0 *  Have you tolerated food without any problems? Yes.    Have you been able to return to your normal activities? Yes.    Do you have any questions about your discharge instructions: Diet   No. Medications  No. Follow up visit  No.  Do you have questions or concerns about your Care? No.  Actions: * If pain score is 4 or above: No action needed, pain <4.

## 2022-10-04 ENCOUNTER — Encounter: Payer: Self-pay | Admitting: Gastroenterology

## 2022-10-06 ENCOUNTER — Telehealth: Payer: Self-pay | Admitting: Gastroenterology

## 2022-10-06 NOTE — Telephone Encounter (Signed)
Patient Daughter called following up with pathology results. Is asking for a nurse to call to advise further.

## 2022-10-07 NOTE — Telephone Encounter (Signed)
Lm on vm for patient's daughter [name] to return call.

## 2022-10-07 NOTE — Telephone Encounter (Signed)
[name] returned call. We reviewed recommendations as outlined below. [name] wanted to know if patient should be on any specific diet, I told her that she does not just make sure she is taking her Protonix 30-60 minutes before a meal. [name] verbalized understanding and had no concerns at the end of the call.

## 2022-10-19 ENCOUNTER — Other Ambulatory Visit: Payer: Self-pay | Admitting: Internal Medicine

## 2022-11-02 DIAGNOSIS — M5416 Radiculopathy, lumbar region: Secondary | ICD-10-CM | POA: Diagnosis not present

## 2022-11-08 ENCOUNTER — Other Ambulatory Visit: Payer: Self-pay | Admitting: Internal Medicine

## 2022-11-22 ENCOUNTER — Other Ambulatory Visit: Payer: Self-pay | Admitting: Internal Medicine

## 2022-11-29 DIAGNOSIS — M5416 Radiculopathy, lumbar region: Secondary | ICD-10-CM | POA: Diagnosis not present

## 2022-12-09 ENCOUNTER — Ambulatory Visit (INDEPENDENT_AMBULATORY_CARE_PROVIDER_SITE_OTHER): Payer: Medicare Other | Admitting: Gastroenterology

## 2022-12-09 ENCOUNTER — Encounter: Payer: Self-pay | Admitting: Gastroenterology

## 2022-12-09 VITALS — BP 122/62 | HR 68 | Ht 64.0 in | Wt 144.0 lb

## 2022-12-09 DIAGNOSIS — R140 Abdominal distension (gaseous): Secondary | ICD-10-CM | POA: Diagnosis not present

## 2022-12-09 DIAGNOSIS — K219 Gastro-esophageal reflux disease without esophagitis: Secondary | ICD-10-CM | POA: Diagnosis not present

## 2022-12-09 DIAGNOSIS — K582 Mixed irritable bowel syndrome: Secondary | ICD-10-CM

## 2022-12-09 DIAGNOSIS — R109 Unspecified abdominal pain: Secondary | ICD-10-CM

## 2022-12-09 DIAGNOSIS — K2270 Barrett's esophagus without dysplasia: Secondary | ICD-10-CM

## 2022-12-09 NOTE — Patient Instructions (Signed)
You have been given a testing kit to check for small intestine bacterial overgrowth (SIBO) which is completed by a company named [facility]. Make sure to return your test in the mail using the return mailing label given to you along with the kit. The test order, your demographic and insurance information have all already been sent to the company. [facility] will collect an upfront charge of $99.74 for commercial insurance plans and $209.74 is you are paying cash. Make sure to discuss with [facility] PRIOR to having the test to see if they have gotten information from your insurance company as to how much your testing will cost out of pocket, if any. Please contact [facility] at phone number [phone] to get instructions regarding how to perform the test as our office is unable to give specific testing instructions.  Take Benefiber. Take Align (probiotic) for 4 weeks,   _______________________________________________________  If your blood pressure at your visit was 140/90 or greater, please contact your primary care physician to follow up on this.  _______________________________________________________  If you are age 87 or older, your body mass index should be between 23-30. Your Body mass index is 24.72 kg/m. If this is out of the aforementioned range listed, please consider follow up with your Primary Care Provider.  __________________________________________________________  The [facility] providers would like to encourage you to use [facility] to communicate with providers for non-urgent requests or questions.  Due to long hold times on the telephone, sending your provider a message by [facility] may be a faster and more efficient way to get a response.  Please allow 48 business hours for a response.  Please remember that this is for non-urgent requests.   Due to recent changes in healthcare laws, you may see the results of your imaging and laboratory studies on MyChart  before your provider has had a chance to review them.  We understand that in some cases there may be results that are confusing or concerning to you. Not all laboratory results come back in the same time frame and the provider may be waiting for multiple results in order to interpret others.  Please give [place] 48 hours in order for your provider to thoroughly review all the results before contacting the office for clarification of your results.    Thank you for choosing me and [facility].  Vito Cirigliano, D.O.
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
## 2022-12-13 ENCOUNTER — Other Ambulatory Visit: Payer: Self-pay | Admitting: Internal Medicine

## 2022-12-13 NOTE — Telephone Encounter (Signed)
Patient has request refill on medication Alprazolam 0.5mg . Patient last refill dated 10/21/2022. Patient has Non Opioid Contract on file dated 03/27/2022. Medication pend and sent to PCP Mahlon Gammon, MD for approval.

## 2022-12-19 DIAGNOSIS — K582 Mixed irritable bowel syndrome: Secondary | ICD-10-CM | POA: Diagnosis not present

## 2022-12-19 DIAGNOSIS — R140 Abdominal distension (gaseous): Secondary | ICD-10-CM | POA: Diagnosis not present

## 2022-12-28 ENCOUNTER — Telehealth: Payer: Self-pay | Admitting: Gastroenterology

## 2022-12-28 NOTE — Telephone Encounter (Signed)
Inbound call from [facility] needing to verify the correct orders for patient. States patient has a different order that what was put in by the provider. Pease advise at [phone].

## 2022-12-28 NOTE — Telephone Encounter (Signed)
[facility] called. Confirmed that the order was intended for SIBO with Lactulose. No other questions at the end of the call.

## 2022-12-28 NOTE — Telephone Encounter (Signed)
Called back [facility] multiple times and LVM to return the call.

## 2022-12-30 ENCOUNTER — Ambulatory Visit (INDEPENDENT_AMBULATORY_CARE_PROVIDER_SITE_OTHER): Payer: Medicare Other | Admitting: Orthopedic Surgery

## 2022-12-30 ENCOUNTER — Encounter: Payer: Self-pay | Admitting: Orthopedic Surgery

## 2022-12-30 VITALS — BP 160/88 | HR 61 | Temp 97.2°F | Resp 16 | Ht 64.0 in | Wt 141.0 lb

## 2022-12-30 DIAGNOSIS — F419 Anxiety disorder, unspecified: Secondary | ICD-10-CM

## 2022-12-30 DIAGNOSIS — F32A Depression, unspecified: Secondary | ICD-10-CM | POA: Diagnosis not present

## 2022-12-30 DIAGNOSIS — K2100 Gastro-esophageal reflux disease with esophagitis, without bleeding: Secondary | ICD-10-CM | POA: Diagnosis not present

## 2022-12-30 DIAGNOSIS — E785 Hyperlipidemia, unspecified: Secondary | ICD-10-CM | POA: Diagnosis not present

## 2022-12-30 DIAGNOSIS — I10 Essential (primary) hypertension: Secondary | ICD-10-CM | POA: Diagnosis not present

## 2022-12-30 DIAGNOSIS — E038 Other specified hypothyroidism: Secondary | ICD-10-CM | POA: Diagnosis not present

## 2022-12-30 DIAGNOSIS — R140 Abdominal distension (gaseous): Secondary | ICD-10-CM

## 2022-12-30 MED ORDER — AMLODIPINE BESYLATE 5 MG PO TABS: 7.5000 mg | ORAL_TABLET | Freq: Every day | ORAL | 2 refills | Status: DC

## 2022-12-30 MED ORDER — SIMETHICONE 80 MG PO CHEW: 80.0000 mg | CHEWABLE_TABLET | Freq: Four times a day (QID) | ORAL | 6 refills | Status: DC | PRN

## 2022-12-30 NOTE — Patient Instructions (Addendum)
Recommend simethicone for abdominal pain> if too expensive> try Gas-X off shelf  Recommend a food journal x 1 week> record any symptoms to identify food triggers   Recommend [facility] if you have scheduling issues  Will increase amlodipine to 1.5 tablets ( 7.5 mg ) daily  Please take blood pressure twice daily x 2 week- contact office if > 150/90 OR < 110/80  Recommended 4 point cane when walking long distances OR standing for a prolonged period of time

## 2022-12-30 NOTE — Progress Notes (Signed)
Careteam: Patient Care Team: Mahlon Gammon, MD as PCP - General (Internal Medicine) Pricilla Riffle, MD as PCP - Cardiology (Cardiology) Zenovia Jordan, MD as Referring Physician (Internal Medicine) [name], Elvin So, MD as Referring Physician (Dermatology) Stephannie Li, MD as Consulting Physician (Ophthalmology) Ermalinda Barrios, MD as Attending Physician (Otolaryngology) Bufford Buttner, MD as Consulting Physician (Nephrology) Lyn Records, PT (Endocrinology)  Seen by: Hazle Nordmann, AGNP-C  PLACE OF SERVICE:  [facility]  Advanced Directive information Does Patient Have a Medical Advance Directive?: Yes, Type of Advance Directive: Healthcare Power of [place];Living will;Out of facility DNR (pink MOST or yellow form), Does patient want to make changes to medical advance directive?: No - Patient declined  Allergies  Allergen Reactions   Losartan Potassium Other (See Comments)   Zoster Vaccine Live Other (See Comments)    No chief complaint on file.    HPI: Patient is a 89 y.o. female  seen today for medical management of chronic conditions.   Daughter present during encounter.   She did not like [facility], moved to independent town home last month.   05/16 she was seen by [facility]. Advised to cont Protonix and Pepcid prn. Breath test pending. She not eat regular diet. Reports increased bloating. Discussed food diary and eliminating carbonated drinks to help with bloating. Will try Gas-X for symptoms.   No recent falls or injuries. Has balance issues with prolonged standing. She has a cane at home, but does not always use. Falls safety reviewed.   BP elevated today, she is on amlodipine 5 mg daily.   No mood changes or panic attacks. Remains on Xanax.    Review of Systems:  Review of Systems  Constitutional:  Negative for fever and weight loss.  HENT:  Negative for congestion.   Eyes:  Negative for blurred vision.  Respiratory:  Negative for cough,  shortness of breath and wheezing.   Cardiovascular:  Negative for chest pain and leg swelling.  Gastrointestinal:  Positive for abdominal pain, constipation, diarrhea, heartburn and nausea.       Bloating  Genitourinary:  Negative for dysuria.  Musculoskeletal:  Positive for joint pain. Negative for falls.  Skin:  Negative for rash.  Neurological:  Positive for weakness. Negative for dizziness and headaches.  Psychiatric/Behavioral:  Negative for depression and memory loss. The patient is nervous/anxious.     Past Medical History:  Diagnosis Date   Allergy    Anxiety    Asthma    Barrett's esophagus    Cancer (HCC)    skin cancer on forehead and nose   DJD (degenerative joint disease)    GERD (gastroesophageal reflux disease)    Goiter    Hemorrhoids    History of colonoscopy    History of shingles    face/eye   History of subdural hematoma    History of traumatic subdural hematoma    Hx of colonic polyps    Hyperlipidemia    Hypertension    IBS (irritable bowel syndrome)    Osteoarthritis    S/P TAVR (transcatheter aortic valve replacement)    23 mm Edwards Sapien 3 transcatheter heart valve placed via percutaneous right transfemoral approach    Sarcoidosis    Sicca (HCC)    Sleep apnea    per pt this is resolved, does not use cpap   Past Surgical History:  Procedure Laterality Date   BREAST LUMPECTOMY  1974   left   CATARACT EXTRACTION W/ INTRAOCULAR LENS  IMPLANT,  BILATERAL     CRANIOTOMY Right 01/04/2014   Procedure: CRANIOTOMY HEMATOMA EVACUATION SUBDURAL;  Surgeon: Reinaldo Meeker, MD;  Location: [facility];  Service: Neurosurgery;  Laterality: Right;   CRANIOTOMY N/A 01/08/2014   Procedure: Redo CRANIOTOMY HEMATOMA EVACUATION SUBDURAL RIGHT;  Surgeon: Reinaldo Meeker, MD;  Location: [facility];  Service: Neurosurgery;  Laterality: N/A;  Redo CRANIOTOMY HEMATOMA EVACUATION SUBDURAL RIGHT   CRANIOTOMY Right 01/10/2014   Procedure: Redo CRANIOTOMY HEMATOMA  EVACUATION SUBDURAL;  Surgeon: Reinaldo Meeker, MD;  Location: [facility]UR[facility] OR[facility];  Service: Neurosurgery;  Laterality: Right;   DILATION AND CURETTAGE OF UTERUS     EYE SURGERY     NASAL SINUS SURGERY     NECK SURGERY     x 2   RIGHT/LEFT HEART CATH AND CORONARY ANGIOGRAPHY N/A 09/27/2018   Procedure: RIGHT/LEFT HEART CATH AND CORONARY ANGIOGRAPHY;  Surgeon: Kathleene Hazel, MD;  Location: [facility] INVASIVE CV LAB;  Service: Cardiovascular;  Laterality: N/A;   TEE WITHOUT CARDIOVERSION N/A 11/28/2018   Procedure: TRANSESOPHAGEAL ECHOCARDIOGRAM (TEE);  Surgeon: Tonny Bollman, MD;  Location: [facility] OR;  Service: Open Heart Surgery;  Laterality: N/A;   TONSILLECTOMY AND ADENOIDECTOMY  1976   TOTAL THYROIDECTOMY  06/05/2008   TRANSCATHETER AORTIC VALVE REPLACEMENT, TRANSFEMORAL N/A 11/28/2018   Procedure: TRANSCATHETER AORTIC VALVE REPLACEMENT, TRANSFEMORAL;  Surgeon: Tonny Bollman, MD;  Location: [facility] OR;  Service: Open Heart Surgery;  Laterality: N/A;   UPPER GASTROINTESTINAL ENDOSCOPY     Social History:   reports that she has never smoked. She has never used smokeless tobacco. She reports current alcohol use. She reports that she does not use drugs.  Family History  Problem Relation Age of Onset   Hypertension Mother    Hyperlipidemia Mother    Kidney cancer Mother    Lymphoma Mother    Hypertension Father    Hyperlipidemia Father    Rheum arthritis Father    Heart failure Father    Heart disease Father    Hypertension Sister    Allergies Sister    Asthma Sister    Allergies Sister    Thyroid cancer Sister    Allergies Sister    Colon cancer Sister    Allergies Sister    Hypertension Brother    Kidney cancer Maternal Grandmother    Liver disease Neg Hx    Esophageal cancer Neg Hx    Rectal cancer Neg Hx    Stomach cancer Neg Hx     Medications: Patient's Medications  New Prescriptions   No medications on file  Previous Medications   ACETAMINOPHEN (TYLENOL) 650 MG CR  TABLET    Take 650 mg by mouth every 8 (eight) hours.   ALPRAZOLAM (XANAX) 0.5 MG TABLET    TAKE 1 TABLET BY MOUTH EVERY MORNING AND 2 TABLETS AT BEDTIME   AMLODIPINE (NORVASC) 5 MG TABLET    TAKE 1 TABLET(5 MG) BY MOUTH DAILY   AMOXICILLIN (AMOXIL) 500 MG TABLET    TAKE 4 TABSULES ONE HOUR PRIOR TO ALL DENTAL VISITS   ASPIRIN 81 MG CHEWABLE TABLET    Chew 1 tablet (81 mg total) by mouth daily.   CARVEDILOL (COREG) 12.5 MG TABLET    TAKE 1 TABLET(12.5 MG) BY MOUTH TWICE DAILY WITH A MEAL   CHOLECALCIFEROL (VITAMIN D-3) 25 MCG (1000 UT) CAPS    Take 1,000 Units by mouth daily.   CYCLOSPORINE (RESTASIS) 0.05 % OPHTHALMIC EMULSION    Place 1 drop into both eyes every  12 (twelve) hours.   LEVOTHYROXINE (SYNTHROID) 75 MCG TABLET    TAKE 1 TABLET(75 MCG) BY MOUTH DAILY   MULTIPLE VITAMIN PO    Take by mouth.   PANTOPRAZOLE (PROTONIX) 40 MG TABLET    Take 1 tablet (40 mg total) by mouth daily.   ROSUVASTATIN (CRESTOR) 5 MG TABLET    Take 5 mg by mouth daily.   ZINC 50 MG CAPS    Take 1 capsule by mouth daily as needed.  Modified Medications   No medications on file  Discontinued Medications   PANTOPRAZOLE (PROTONIX) 40 MG TABLET    Take 1 tablet (40 mg total) by mouth 2 (two) times daily. Take twice daily for 4 weeks then reduce back to 40 mg daily    Physical Exam:  Vitals:   12/30/22 1519  BP: (!) 140/86  Pulse: 61  Resp: 16  Temp: (!) 97.2 F (36.2 C)  SpO2: 94%  Weight: 141 lb (64 kg)  Height: 5\' 4"  (1.626 m)   Body mass index is 24.2 kg/m. Wt Readings from Last 3 Encounters:  12/30/22 141 lb (64 kg)  12/09/22 144 lb (65.3 kg)  09/29/22 145 lb (65.8 kg)    Physical Exam Vitals reviewed.  Constitutional:      General: She is not in acute distress. HENT:     Head: Normocephalic.  Eyes:     General:        Right eye: No discharge.        Left eye: No discharge.  Cardiovascular:     Rate and Rhythm: Normal rate and regular rhythm.     Pulses: Normal pulses.     Heart  sounds: Normal heart sounds.  Pulmonary:     Effort: Pulmonary effort is normal. No respiratory distress.     Breath sounds: Normal breath sounds. No wheezing.  Abdominal:     General: Bowel sounds are normal. There is no distension.     Palpations: Abdomen is soft. There is no mass.     Tenderness: There is no abdominal tenderness. There is no guarding or rebound.     Hernia: No hernia is present.  Musculoskeletal:     Cervical back: Neck supple.     Right lower leg: No edema.     Left lower leg: No edema.  Skin:    General: Skin is warm.     Capillary Refill: Capillary refill takes less than 2 seconds.  Neurological:     General: No focal deficit present.     Mental Status: She is alert and oriented to person, place, and time.  Psychiatric:        Mood and Affect: Mood normal.     Labs reviewed: Basic Metabolic Panel: Recent Labs    07/16/22 1307 07/16/22 1318 08/23/22 0754  NA 141  --  139  K 3.4*  --  4.0  CL 103  --  106  CO2 21*  --  25  GLUCOSE 151*  --  104*  BUN 32*  --  23  CREATININE 0.86  --  0.87  CALCIUM 7.9*  --  8.2*  MG  --  1.8  --   TSH  --   --  2.08   Liver Function Tests: Recent Labs    07/16/22 1307  AST 46*  ALT 51*  ALKPHOS 48  BILITOT 1.0  PROT 6.7  ALBUMIN 4.0   Recent Labs    07/16/22 1307  LIPASE 105*   No  results for input(s): "AMMONIA" in the last 8760 hours. CBC: Recent Labs    07/16/22 1307 08/23/22 0754  WBC 9.2 5.9  NEUTROABS  --  3,699  HGB 17.1* 12.6  HCT 50.1* 36.8  MCV 95.1 92.9  PLT 147* 211   Lipid Panel: Recent Labs    08/23/22 0754  CHOL 245*  HDL 56  LDLCALC 162*  TRIG 143  CHOLHDL 4.4   TSH: Recent Labs    08/23/22 0754  TSH 2.08   A1C: Lab Results  Component Value Date   HGBA1C 5.4 11/23/2018     Assessment/Plan 1. Essential hypertension - uncontrolled, goal < 150/90 - increase amlodipine to 7.5 mg daily - check BP x 2 weeks> report to PCP - limit sodium in diet - cont  metoprolol - amLODipine (NORVASC) 5 MG tablet; Take 1.5 tablets (7.5 mg total) by mouth daily.  Dispense: 90 tablet; Refill: 2  2. Hyperlipidemia, unspecified hyperlipidemia type - total 245, LDL 162 08/23/2022 - cont low fat diet, avoid processed/fried foods - cont statin - lipid panel- future  3. Other specified hypothyroidism - TSH 2.08 08/23/2022 - cont levothyroxine  4. Gastroesophageal reflux disease with esophagitis, unspecified whether hemorrhage - ongoing - followed by GI - breath test pending - cont Protonix - cont Prilosec - recommend food journal to identify triggers  5. Anxiety and depression - no mood changes/panic - cont Xanax  6. Abdominal bloating - followed by GI - discussed eliminating carbonated drinks - food journal to identify foods triggers - trial Gas-X - simethicone (GAS-X) 80 MG chewable tablet; Chew 1 tablet (80 mg total) by mouth every 6 (six) hours as needed for flatulence.  Dispense: 30 tablet; Refill: 6  Total time: 38 minutes. Greater than 50% of total time spent doing patient education regarding health maintenance, HTN, HLD, anxiety/depression, abdominal bloating and GERD including symptom/medication management.    Next appt: 05/05/2023  Bettina Gavia  [facility] [phone]

## 2023-01-03 ENCOUNTER — Telehealth: Payer: Self-pay | Admitting: Gastroenterology

## 2023-01-03 DIAGNOSIS — K638219 Small intestinal bacterial overgrowth, unspecified: Secondary | ICD-10-CM

## 2023-01-03 DIAGNOSIS — R140 Abdominal distension (gaseous): Secondary | ICD-10-CM

## 2023-01-03 MED ORDER — RIFAXIMIN 550 MG PO TABS: 550.0000 mg | ORAL_TABLET | Freq: Three times a day (TID) | ORAL | 0 refills | Status: AC

## 2023-01-03 NOTE — Telephone Encounter (Signed)
Reviewed results from the [facility] breath testing which are consistent with SIBO, with increased hydrogen at 87 bpm (normal <20 ppm).  Plan for the following:  - Rifaximin 550 mg p.o. 3 times daily x 14 days - Start probiotic (i.e. align or Florastor) and take for 4 weeks beyond completion of rifaximin course

## 2023-01-03 NOTE — Telephone Encounter (Signed)
Will you please initiate Prior Auth for Rifaximin for this patient please? Thank you.

## 2023-01-03 NOTE — Telephone Encounter (Signed)
Spoke with patient's daughter Brenda [name] regarding [facility] breath test result. Informed her patient has Small intestinal bacterial overgrowth (SIBO) and Dr. Barron Alvine recommended Rifaximin treatment for 14 days. Informed her patient needs to start probiotic such as align and take for 4 weeks beyond completion of rifaximin course. I gave her option to send Rifaximin to [facility] but she would like [place] to send it to [facility]. Medication sent to the patient's pharmacy. Brenda [name] verbalized understanding. Prior Auth request sent to PA team to initiate the process.

## 2023-01-03 NOTE — Telephone Encounter (Signed)
See result telephone notes for details.

## 2023-01-03 NOTE — Addendum Note (Signed)
Addended by: Coletta Memos on: 01/03/2023 03:14 PM   Modules accepted: Orders

## 2023-01-03 NOTE — Telephone Encounter (Signed)
PT called to get update on SIBO test. Please advise

## 2023-01-04 ENCOUNTER — Telehealth: Payer: Self-pay | Admitting: Pharmacy Technician

## 2023-01-04 ENCOUNTER — Other Ambulatory Visit (HOSPITAL_COMMUNITY): Payer: Self-pay

## 2023-01-04 NOTE — Telephone Encounter (Signed)
Patient Advocate Encounter  Received notification from [employer] that prior authorization for [facility] 550MG  is required.   PA submitted on 6.11.24 Key [id number] Status is pending

## 2023-01-05 ENCOUNTER — Encounter: Payer: Medicare Other | Admitting: Internal Medicine

## 2023-01-05 ENCOUNTER — Encounter: Payer: Self-pay | Admitting: Orthopedic Surgery

## 2023-01-05 NOTE — Telephone Encounter (Signed)
PA has been APPROVED from 01/04/2023-01/18/2023

## 2023-01-06 ENCOUNTER — Other Ambulatory Visit: Payer: Self-pay | Admitting: Orthopedic Surgery

## 2023-01-06 DIAGNOSIS — N898 Other specified noninflammatory disorders of vagina: Secondary | ICD-10-CM

## 2023-01-06 MED ORDER — FLUCONAZOLE 150 MG PO TABS: 150.0000 mg | ORAL_TABLET | Freq: Once | ORAL | 1 refills | Status: AC

## 2023-01-10 NOTE — Progress Notes (Addendum)
Cardiology Office Note    Date:  01/11/2023   ID:  Brenda [name], Brenda [name] 1930-10-10, MRN [id number]  PCP:  Brenda Heir, NP  Cardiologist:  Brenda Pates, MD  Electrophysiologist:  None   Chief Complaint: f/u CAD, prior TAVR  History of Present Illness:   Brenda [name] is a 89 y.o. female with history of mild to moderate CAD by pre-TAVR cath 2020 managed medically, severe AS s/p TAVR 11/2018, mild carotid disease (1-39% B in 2020), chronic HFpEF, HTN, HLD, sarcoidosis, GE reflux disease with Barrett's esophagus, SIBO, chronic cervical spine issues and osteoarthritis seen for follow-up. Last OV 07/2022 reviewed, had stopped rosuvastatin for unclear reasons and BP mildly elevated. It appears amlodipine was increased to 7.5mg  by an outside source following that visit.  She returns for follow-up today with her daughter. Overall she appears to be doing well from cardiac standpoint. She reports some increase in LE edema the last few months in her ankles. No anginal type chest pain. She is working with GI for dx of SIBO, has had issues with belching and discomfort with eating. HR is 46 - 49bpm today. No syncope or dizziness. BP has been controlled.  Labwork independently reviewed: 07/2022 Hgb 12.6, plt 211, LDL 162. Trig 143, K 4.0, Cr 0.87, TSH wnl 06/2022 AST/ALT up a bit  Past History   Past Medical History:  Diagnosis Date   Allergy    Anxiety    Asthma    Barrett's esophagus    Cancer (HCC)    skin cancer on forehead and nose   DJD (degenerative joint disease)    GERD (gastroesophageal reflux disease)    Goiter    Hemorrhoids    History of colonoscopy    History of shingles    face/eye   History of subdural hematoma    History of traumatic subdural hematoma    Hx of colonic polyps    Hyperlipidemia    Hypertension    IBS (irritable bowel syndrome)    Osteoarthritis    S/P TAVR (transcatheter aortic valve replacement)    23 mm Edwards Sapien 3 transcatheter heart valve placed  via percutaneous right transfemoral approach    Sarcoidosis    Sicca (HCC)    Sleep apnea    per pt this is resolved, does not use cpap    Past Surgical History:  Procedure Laterality Date   BREAST LUMPECTOMY  1974   left   CATARACT EXTRACTION W/ INTRAOCULAR LENS  IMPLANT, BILATERAL     CRANIOTOMY Right 01/04/2014   Procedure: CRANIOTOMY HEMATOMA EVACUATION SUBDURAL;  Surgeon: Reinaldo Meeker, MD;  Location: [facility];  Service: Neurosurgery;  Laterality: Right;   CRANIOTOMY N/A 01/08/2014   Procedure: Redo CRANIOTOMY HEMATOMA EVACUATION SUBDURAL RIGHT;  Surgeon: Reinaldo Meeker, MD;  Location: [facility];  Service: Neurosurgery;  Laterality: N/A;  Redo CRANIOTOMY HEMATOMA EVACUATION SUBDURAL RIGHT   CRANIOTOMY Right 01/10/2014   Procedure: Redo CRANIOTOMY HEMATOMA EVACUATION SUBDURAL;  Surgeon: Reinaldo Meeker, MD;  Location: [facility];  Service: Neurosurgery;  Laterality: Right;   DILATION AND CURETTAGE OF UTERUS     EYE SURGERY     NASAL SINUS SURGERY     NECK SURGERY     x 2   RIGHT/LEFT HEART CATH AND CORONARY ANGIOGRAPHY N/A 09/27/2018   Procedure: RIGHT/LEFT HEART CATH AND CORONARY ANGIOGRAPHY;  Surgeon: Kathleene Hazel, MD;  Location: [facility];  Service: Cardiovascular;  Laterality: N/A;   TEE  WITHOUT CARDIOVERSION N/A 11/28/2018   Procedure: TRANSESOPHAGEAL ECHOCARDIOGRAM (TEE);  Surgeon: Tonny Bollman, MD;  Location: [facility];  Service: Open Heart Surgery;  Laterality: N/A;   TONSILLECTOMY AND ADENOIDECTOMY  1976   TOTAL THYROIDECTOMY  06/05/2008   TRANSCATHETER AORTIC VALVE REPLACEMENT, TRANSFEMORAL N/A 11/28/2018   Procedure: TRANSCATHETER AORTIC VALVE REPLACEMENT, TRANSFEMORAL;  Surgeon: Tonny Bollman, MD;  Location: [facility] OR;  Service: Open Heart Surgery;  Laterality: N/A;   UPPER GASTROINTESTINAL ENDOSCOPY      Current Medications: Current Meds  Medication Sig   acetaminophen (TYLENOL) 650 MG CR tablet Take 650 mg by mouth every 8 (eight)  hours.   ALPRAZolam (XANAX) 0.5 MG tablet TAKE 1 TABLET BY MOUTH EVERY MORNING AND 2 TABLETS AT BEDTIME   amLODipine (NORVASC) 5 MG tablet Take 1.5 tablets (7.5 mg total) by mouth daily.   amoxicillin (AMOXIL) 500 MG tablet TAKE 4 TABSULES ONE HOUR PRIOR TO ALL DENTAL VISITS   aspirin 81 MG chewable tablet Chew 1 tablet (81 mg total) by mouth daily.   carvedilol (COREG) 12.5 MG tablet TAKE 1 TABLET(12.5 MG) BY MOUTH TWICE DAILY WITH A MEAL   Cholecalciferol (VITAMIN D-3) 25 MCG (1000 UT) CAPS Take 1,000 Units by mouth daily.   cycloSPORINE (RESTASIS) 0.05 % ophthalmic emulsion Place 1 drop into both eyes every 12 (twelve) hours.   levothyroxine (SYNTHROID) 75 MCG tablet TAKE 1 TABLET(75 MCG) BY MOUTH DAILY   MULTIPLE VITAMIN PO Take by mouth.   pantoprazole (PROTONIX) 40 MG tablet Take 1 tablet (40 mg total) by mouth daily.   rifaximin (XIFAXAN) 550 MG TABS tablet Take 1 tablet (550 mg total) by mouth 3 (three) times daily for 14 days.   rosuvastatin (CRESTOR) 5 MG tablet Take 5 mg by mouth daily.   simethicone (GAS-X) 80 MG chewable tablet Chew 1 tablet (80 mg total) by mouth every 6 (six) hours as needed for flatulence.   Zinc 50 MG CAPS Take 1 capsule by mouth daily as needed.     Allergies:   Losartan potassium and Zoster vaccine live   Social History   Socioeconomic History   Marital status: Widowed    Spouse name: Not on file   Number of children: 4   Years of education: Not on file   Highest education level: Not on file  Occupational History   Occupation: Retired    Associate Professor: RETIRED    Comment: worked for [employer]  Tobacco Use   Smoking status: Never   Smokeless tobacco: Never  Vaping Use   Vaping Use: Never used  Substance and Sexual Activity   Alcohol use: Yes    Comment: Occ glass of wine with dinner   Drug use: No   Sexual activity: Not on file  Other Topics Concern   Not on file  Social History Narrative   Never used tobacco.   Drinks 4 glasses  wine per week   Patient does consume caffeine.   Lives in apartment at [facility] alone   [employer] school, highest level completed.   Past profession was accounting   Patient does exercise-stretches each morning and yoga   Patient has living will and health care power of attorney, no DNR   Social Determinants of Health   Financial Resource Strain: Not on file  Food Insecurity: Not on file  Transportation Needs: Not on file  Physical Activity: Not on file  Stress: Not on file  Social Connections: Not on file     Family History:  The patient's =family history includes Allergies in her sister, sister, sister, and sister; Asthma in her sister; Colon cancer in her sister; Heart disease in her father; Heart failure in her father; Hyperlipidemia in her father and mother; Hypertension in her brother, father, mother, and sister; Kidney cancer in her maternal grandmother and mother; Lymphoma in her mother; Rheum arthritis in her father; Thyroid cancer in her sister. There is no history of Liver disease, Esophageal cancer, Rectal cancer, or Stomach cancer.  ROS:   Please see the history of present illness.  All other systems are reviewed and otherwise negative.    EKG(s)/Additional Testing   EKG:  EKG is ordered today, personally reviewed, demonstrating SB 49bpm TWI III similar to prior. We did have a duplicate EKG that was run at the same time with HR 46bpm which may not appear in EMR, similar morphology to above.  CV Studies: Cardiac studies reviewed are outlined and summarized above. Otherwise please see EMR for full report.  Recent Labs: 07/16/2022: ALT 51; Magnesium 1.8 08/23/2022: BUN 23; Creat 0.87; Hemoglobin 12.6; Platelets 211; Potassium 4.0; Sodium 139; TSH 2.08  Recent Lipid Panel    Component Value Date/Time   CHOL 245 (H) 08/23/2022 0754   CHOL 273 (H) 08/18/2020 1153   TRIG 143 08/23/2022 0754   HDL 56 08/23/2022 0754   HDL 68 08/18/2020 1153   CHOLHDL 4.4 08/23/2022  0754   VLDL 51.2 (H) 09/25/2020 1541   LDLCALC 162 (H) 08/23/2022 0754   LDLDIRECT 124.0 09/25/2020 1541    PHYSICAL EXAM:    VS:  BP 120/62   Pulse (!) 49   Ht 5\' 4"  (1.626 m)   Wt 145 lb (65.8 kg)   SpO2 97%   BMI 24.89 kg/m   BMI: Body mass index is 24.89 kg/m.  GEN: Well nourished, well developed female in no acute distress HEENT: normocephalic, atraumatic Neck: no JVD, carotid bruits, or masses Cardiac: RRR; no murmurs, rubs, or gallops, trace ankle edema  Respiratory:  clear to auscultation bilaterally, normal work of breathing GI: soft, nontender, nondistended, + BS MS: no deformity or atrophy Skin: warm and dry, no rash Neuro:  Alert and Oriented x 3, Strength and sensation are intact, follows commands Psych: euthymic mood, full affect  Wt Readings from Last 3 Encounters:  01/11/23 145 lb (65.8 kg)  12/30/22 141 lb (64 kg)  12/09/22 144 lb (65.3 kg)     ASSESSMENT & PLAN:    1. Sinus bradycardia - HR 46-49bpm in clinic. Not acutely symptomatic though possibly more sluggish lately. Will reduce carvedilol to 6.25mg  BID and check basic labs. Given prior abnormalities in LFTs will get CMET instead of BMET. Will also get TSH. They will check HR at home on lower dose and notify if still running <50. We might need to increase her other antihypertensives based on how her BP responds - increasing amlodipine may exacerbate her edema, so would consider alternative option such as thiazide diuretic versus ARB.  2. Chronic HFpEF, HTN, lower extremity edema - mild LE edema noted the last few months. Getting labs as above. Suspect combination of amlodipine + chronic HFpEF + possibly sinus bradycardia. Carvedilol adjusted as above. Will rx Lasix 20mg  daily x 3 days then daily PRN with BMET in 1 week. She has GI issues and h/o dry mouth hence why not more aggressive at this time. They will notify if edema does not improve in which case we might need to decrease amlodipine back to 5mg   daily and work on her antihypertensive regimen in a different direction.  3. CAD, HLD - no convincing anginal symptoms. Back on rosuvastatin now by primary care - lipids managed by PCP. Will defer to their team to follow this up. She will be establishing with a new PCP soon.  4. Severe AS s/p TAVR - due for repeat echo, will arrange. SBE ppx reviewed. Takes amoxicillin prior to dental work.  5. Mild carotid artery disease - minimal 2014, mild 2020. In setting of age and comorbidities with lack of CVA/TIA symptoms, no indication to acutely repeat at this time.    Disposition: F/u with me in 1 month.   Medication Adjustments/Labs and Tests Ordered: Current medicines are reviewed at length with the patient today.  Concerns regarding medicines are outlined above. Medication changes, Labs and Tests ordered today are summarized above and listed in the Patient Instructions accessible in Encounters.   Signed, Laurann Montana, PA-C  01/11/2023 2:44 PM    [facility] Phone: [phone]; Fax: [phone]

## 2023-01-11 ENCOUNTER — Ambulatory Visit: Payer: Medicare Other | Attending: Internal Medicine | Admitting: Physician Assistant

## 2023-01-11 ENCOUNTER — Encounter: Payer: Self-pay | Admitting: Physician Assistant

## 2023-01-11 VITALS — BP 120/62 | HR 46 | Ht 64.0 in | Wt 145.0 lb

## 2023-01-11 DIAGNOSIS — I5032 Chronic diastolic (congestive) heart failure: Secondary | ICD-10-CM | POA: Diagnosis not present

## 2023-01-11 DIAGNOSIS — I2510 Atherosclerotic heart disease of native coronary artery without angina pectoris: Secondary | ICD-10-CM | POA: Insufficient documentation

## 2023-01-11 DIAGNOSIS — I10 Essential (primary) hypertension: Secondary | ICD-10-CM | POA: Diagnosis not present

## 2023-01-11 DIAGNOSIS — R001 Bradycardia, unspecified: Secondary | ICD-10-CM | POA: Insufficient documentation

## 2023-01-11 DIAGNOSIS — R600 Localized edema: Secondary | ICD-10-CM | POA: Insufficient documentation

## 2023-01-11 DIAGNOSIS — Z952 Presence of prosthetic heart valve: Secondary | ICD-10-CM | POA: Diagnosis not present

## 2023-01-11 DIAGNOSIS — I779 Disorder of arteries and arterioles, unspecified: Secondary | ICD-10-CM | POA: Insufficient documentation

## 2023-01-11 DIAGNOSIS — E785 Hyperlipidemia, unspecified: Secondary | ICD-10-CM | POA: Diagnosis not present

## 2023-01-11 MED ORDER — FUROSEMIDE 20 MG PO TABS
20.0000 mg | ORAL_TABLET | ORAL | 3 refills | Status: AC | PRN
Start: 2023-01-11 — End: ?

## 2023-01-11 MED ORDER — CARVEDILOL 6.25 MG PO TABS
6.2500 mg | ORAL_TABLET | Freq: Two times a day (BID) | ORAL | 3 refills | Status: DC
Start: 2023-01-11 — End: 2024-01-03

## 2023-01-11 NOTE — Patient Instructions (Signed)
Medication Instructions:  Your physician has recommended you make the following change in your medication:   Decrease carvedilol to 6.25 mg 2 times daily  Start Lasix 20 mg daily then as needed 1 time daily for swelling.  Weigh daily in the morning after using restroom before getting dressed. If weight gain of 2lbs in 24 hours or 5lbs in a week, notify office.   *If you need a refill on your cardiac medications before your next appointment, please call your pharmacy*   Lab Work: CMET CBC, TSH If you have labs (blood work) drawn today and your tests are completely normal, you will receive your results only by: MyChart Message (if you have MyChart) OR A paper copy in the mail If you have any lab test that is abnormal or we need to change your treatment, we will call you to review the results.   Testing/Procedures: Your physician has requested that you have an echocardiogram. Echocardiography is a painless test that uses sound waves to create images of your heart. It provides your doctor with information about the size and shape of your heart and how well your heart's chambers and valves are working. This procedure takes approximately one hour. There are no restrictions for this procedure. Please do NOT wear cologne, perfume, aftershave, or lotions (deodorant is allowed). Please arrive 15 minutes prior to your appointment time.    Follow-Up: At [facility], you and your health needs are our priority.  As part of our continuing mission to provide you with exceptional heart care, we have created designated Provider Care Teams.  These Care Teams include your primary Cardiologist (physician) and Advanced Practice Providers (APPs -  Physician Assistants and Nurse Practitioners) who all work together to provide you with the care you need, when you need it.    Your next appointment:   1 month(s)  Provider:   Ronie Spies, PA-C         Other Instructions    Endocarditis  Information  You may be at risk for developing endocarditis since you have an artificial heart valve or a repaired heart valve. Endocarditis is an infection of the lining of the heart or heart valves. Certain surgical and dental procedures may put you at risk, such as teeth cleaning or other dental procedures or other medical procedures. Notify our office or your dentist before having any dental work or invasive/surgical procedures. You will need to take antibiotics before certain procedures. To prevent endocarditis, maintain good oral health. Seek prompt medical attention for any mouth/gum, skin or urinary tract infections.

## 2023-01-12 ENCOUNTER — Telehealth: Payer: Self-pay | Admitting: Physician Assistant

## 2023-01-12 DIAGNOSIS — R791 Abnormal coagulation profile: Secondary | ICD-10-CM

## 2023-01-12 DIAGNOSIS — R748 Abnormal levels of other serum enzymes: Secondary | ICD-10-CM

## 2023-01-12 LAB — CBC
Hematocrit: 40.5 % (ref 34.0–46.6)
Hemoglobin: 13.4 g/dL (ref 11.1–15.9)
MCH: 31.7 pg (ref 26.6–33.0)
MCHC: 33.1 g/dL (ref 31.5–35.7)
MCV: 96 fL (ref 79–97)
Platelets: 180 10*3/uL (ref 150–450)
RBC: 4.23 x10E6/uL (ref 3.77–5.28)
RDW: 14.5 % (ref 11.7–15.4)
WBC: 7.3 10*3/uL (ref 3.4–10.8)

## 2023-01-12 LAB — COMPREHENSIVE METABOLIC PANEL
ALT: 322 IU/L — ABNORMAL HIGH (ref 0–32)
AST: 296 IU/L — ABNORMAL HIGH (ref 0–40)
Albumin: 4.4 g/dL (ref 3.7–4.7)
Alkaline Phosphatase: 78 IU/L (ref 44–121)
BUN/Creatinine Ratio: 26 (ref 12–28)
BUN: 28 mg/dL — ABNORMAL HIGH (ref 8–27)
Bilirubin Total: 0.7 mg/dL (ref 0.0–1.2)
CO2: 22 mmol/L (ref 20–29)
Calcium: 8.5 mg/dL — ABNORMAL LOW (ref 8.7–10.3)
Chloride: 102 mmol/L (ref 96–106)
Creatinine, Ser: 1.07 mg/dL — ABNORMAL HIGH (ref 0.57–1.00)
Globulin, Total: 2.4 g/dL (ref 1.5–4.5)
Glucose: 88 mg/dL (ref 70–99)
Potassium: 4.5 mmol/L (ref 3.5–5.2)
Sodium: 142 mmol/L (ref 134–144)
Total Protein: 6.8 g/dL (ref 6.0–8.5)
eGFR: 50 mL/min/{1.73_m2} — ABNORMAL LOW (ref >59)

## 2023-01-12 LAB — TSH: TSH: 0.59 u[IU]/mL (ref 0.450–4.500)

## 2023-01-12 NOTE — Telephone Encounter (Signed)
Lm to call back ./cy 

## 2023-01-12 NOTE — Telephone Encounter (Signed)
   Patient seen in office yesterday by me, labs ordered. They did not come back to my inbox. Order looks like it went to Dr. Tenny Craw. Will cc this message to her so she is aware I have looked at them but cannot act on them in result note hence this phone note.  Please let patient/daughter know CBC, TSH stable. Kidney function up a smidge from prior but compatible with prior trends. The one concern I have is about her rising liver function testing. I cannot fully explain this from a cardiac standpoint. She is on rosuvastatin at low dose prescribed by primary care - recommend to discontinue for now, but also please reach out [place] [facility] team to see if they can review her labs today to advise on further management and follow-up monitoring given rifaxamin rx as well. She is followed by Dr. Barron Alvine there. LFTs were also mildly elevated in 06/2022 but may not have been on statin at that time so not sure it's truly the statin causing the issues. Regardless, she should stop rosuvastatin for now.  From cardiac standpoint would otherwise continue plan as discussed. Keep an eye on BP since we decreased carvedilol yesterday. If she finds she's running 140 or above between now and next OV, let us know.

## 2023-01-13 NOTE — Telephone Encounter (Signed)
Will route msg to GI Dr. Barron Alvine in the meantime to revew LFTs (see msg below).

## 2023-01-13 NOTE — Telephone Encounter (Signed)
Daughter was returning call. Please advise  ?

## 2023-01-13 NOTE — Telephone Encounter (Signed)
Returned call to [place] pt's daughter. Advised her of Ronie Spies PA-C message. Instructed her to stop the rosuvastatin. Also told her that GI would be reaching out (if they had not already) to schedule a appointment with them. Brenda [name] stated understanding of instructions.

## 2023-01-13 NOTE — Telephone Encounter (Signed)
Spoke with patient's daughter Marcelino Duster. Provided dr. Frankey Shown recommendations. Advised her patient is due for lab in 7days, and recheck liver enzymes in 1 month. Stated her mother hasn't started taking Xifaxan. Advised her per Dr. Barron Alvine she needs to start to take the medication. PA for [facility] is already approved. Follow up with Dr. Barron Alvine is scheduled on 03/15/23 at 3:40 PM. She verbalized undersatanding. No other concerns at the end of the call. Lab is already ordered in EPIC.

## 2023-01-13 NOTE — Telephone Encounter (Signed)
This is not a preop note. Original msg sent to triage. Routed to triage to return call.

## 2023-01-18 ENCOUNTER — Other Ambulatory Visit (INDEPENDENT_AMBULATORY_CARE_PROVIDER_SITE_OTHER): Payer: Medicare Other

## 2023-01-18 DIAGNOSIS — R791 Abnormal coagulation profile: Secondary | ICD-10-CM | POA: Diagnosis not present

## 2023-01-18 DIAGNOSIS — R748 Abnormal levels of other serum enzymes: Secondary | ICD-10-CM | POA: Diagnosis not present

## 2023-01-18 LAB — HEPATIC FUNCTION PANEL
ALT: 137 U/L — ABNORMAL HIGH (ref 0–35)
AST: 77 U/L — ABNORMAL HIGH (ref 0–37)
Albumin: 4.3 g/dL (ref 3.5–5.2)
Alkaline Phosphatase: 56 U/L (ref 39–117)
Bilirubin, Direct: 0.2 mg/dL (ref 0.0–0.3)
Total Bilirubin: 0.9 mg/dL (ref 0.2–1.2)
Total Protein: 7.1 g/dL (ref 6.0–8.3)

## 2023-01-18 LAB — PROTIME-INR
INR: 1 ratio (ref 0.8–1.0)
Prothrombin Time: 11.1 s (ref 9.6–13.1)

## 2023-01-18 NOTE — Addendum Note (Signed)
Addended by: Trellis Paganini D on: 01/18/2023 11:26 AM   Modules accepted: Orders

## 2023-01-18 NOTE — Addendum Note (Signed)
Addended by: Kamill Fulbright D on: 01/18/2023 11:26 AM   Modules accepted: Orders  

## 2023-01-19 ENCOUNTER — Other Ambulatory Visit: Payer: Self-pay

## 2023-01-19 DIAGNOSIS — R748 Abnormal levels of other serum enzymes: Secondary | ICD-10-CM

## 2023-01-21 ENCOUNTER — Ambulatory Visit: Payer: Medicare Other | Admitting: Internal Medicine

## 2023-02-10 ENCOUNTER — Other Ambulatory Visit: Payer: Self-pay | Admitting: Adult Health

## 2023-02-10 DIAGNOSIS — L821 Other seborrheic keratosis: Secondary | ICD-10-CM | POA: Diagnosis not present

## 2023-02-10 DIAGNOSIS — L570 Actinic keratosis: Secondary | ICD-10-CM | POA: Diagnosis not present

## 2023-02-10 DIAGNOSIS — C44311 Basal cell carcinoma of skin of nose: Secondary | ICD-10-CM | POA: Diagnosis not present

## 2023-02-10 NOTE — Telephone Encounter (Signed)
Patient medication came back to refill pool.

## 2023-02-11 ENCOUNTER — Ambulatory Visit (HOSPITAL_COMMUNITY): Payer: Medicare Other | Attending: Internal Medicine

## 2023-02-11 DIAGNOSIS — R001 Bradycardia, unspecified: Secondary | ICD-10-CM

## 2023-02-11 DIAGNOSIS — I779 Disorder of arteries and arterioles, unspecified: Secondary | ICD-10-CM

## 2023-02-11 DIAGNOSIS — R600 Localized edema: Secondary | ICD-10-CM | POA: Diagnosis not present

## 2023-02-11 DIAGNOSIS — I2510 Atherosclerotic heart disease of native coronary artery without angina pectoris: Secondary | ICD-10-CM

## 2023-02-11 DIAGNOSIS — I10 Essential (primary) hypertension: Secondary | ICD-10-CM | POA: Diagnosis not present

## 2023-02-11 DIAGNOSIS — I5032 Chronic diastolic (congestive) heart failure: Secondary | ICD-10-CM | POA: Diagnosis not present

## 2023-02-11 DIAGNOSIS — Z952 Presence of prosthetic heart valve: Secondary | ICD-10-CM | POA: Diagnosis not present

## 2023-02-11 DIAGNOSIS — E785 Hyperlipidemia, unspecified: Secondary | ICD-10-CM

## 2023-02-11 LAB — ECHOCARDIOGRAM COMPLETE
AR max vel: 1.52 cm2
AV Area VTI: 1.61 cm2
AV Area mean vel: 1.7 cm2
AV Mean grad: 13.3 mmHg
AV Peak grad: 29.1 mmHg
Ao pk vel: 2.7 m/s
Area-P 1/2: 2.69 cm2
Calc EF: 64.2 %
S' Lateral: 2.3 cm
Single Plane A2C EF: 63.3 %
Single Plane A4C EF: 66.9 %

## 2023-02-14 ENCOUNTER — Other Ambulatory Visit: Payer: Self-pay | Admitting: Internal Medicine

## 2023-02-18 ENCOUNTER — Telehealth: Payer: Self-pay

## 2023-02-18 NOTE — Telephone Encounter (Signed)
LVM to [place], patient's daughter to remind that Patient is due for lab at this time to check her liver enzymes. Lab is already in the Epic. Mychart message sent as well.

## 2023-02-21 ENCOUNTER — Other Ambulatory Visit (INDEPENDENT_AMBULATORY_CARE_PROVIDER_SITE_OTHER): Payer: Medicare Other

## 2023-02-21 DIAGNOSIS — R748 Abnormal levels of other serum enzymes: Secondary | ICD-10-CM

## 2023-02-21 LAB — HEPATIC FUNCTION PANEL
ALT: 29 U/L (ref 0–35)
AST: 30 U/L (ref 0–37)
Albumin: 4.2 g/dL (ref 3.5–5.2)
Alkaline Phosphatase: 65 U/L (ref 39–117)
Bilirubin, Direct: 0.1 mg/dL (ref 0.0–0.3)
Total Bilirubin: 0.7 mg/dL (ref 0.2–1.2)
Total Protein: 6.8 g/dL (ref 6.0–8.3)

## 2023-02-24 NOTE — Progress Notes (Signed)
Cardiology Office Note    Date:  02/25/2023  ID:  Myrka, Sylva [birth year], MRN [id number] PCP:  Brenda Heir, NP  Cardiologist:  Brenda Pates, MD  Electrophysiologist:  None   Chief Complaint: f/u bradycardia, edema  History of Present Illness: .    Brenda [name] is a 89 y.o. female with visit-pertinent history of mild to moderate CAD by pre-TAVR cath 2020 managed medically, severe AS s/p TAVR 11/2018, mild MR by echo, sinus bradycardia, mild carotid disease (1-39% B in 2020), chronic HFpEF, HTN, HLD, sarcoidosis, GE reflux disease with Barrett's esophagus, SIBO, chronic cervical spine issues and osteoarthritis seen for follow-up.   When seen in follow-up 12/2022, she reported an increase in LE edema the previous few months. She'd had an increase in amlodipine by an outside source several months prior. Her HR was in the 40s. Carvedilol was reduced and she was trialed on short course of Lasix. LFTs were elevated so statin stopped and subsequently followed for GI for this. She has also been seeing them for SIBO/Rifaxamin therapy. 2D echo 02/11/23 showed EF 60-65%, mild LVH, G1DD, intracavitary gradient of with Valsalva, mild MR, TAVR in place with no AI. Dr. Tenny [name] felt this was reassuring. She is seen for follow-up today doing well without CP, SOB, palpitations, dizziness, syncope. Edema has improved (only took one dose of Lasix).   Labwork independently reviewed: 01/2023 LFTs improved 12/2022 CBC, TSH OK, K 4.5, Cr 1.07 07/2022 LDL 162, HDL 56, trig 143  ROS: .    Please see the history of present illness.  All other systems are reviewed and otherwise negative.  Studies Reviewed: Marland Kitchen    EKG:  EKG is not ordered today.  CV Studies: Cardiac studies reviewed are outlined and summarized above. Otherwise please see EMR for full report.   Physical Exam:    VS:  BP 130/70   Pulse (!) 55   Ht 5\' 4"  (1.626 m)   Wt 140 lb (63.5 kg)   SpO2 96%   BMI 24.03 kg/m    Wt Readings from  Last 3 Encounters:  02/25/23 140 lb (63.5 kg)  01/11/23 145 lb (65.8 kg)  12/30/22 141 lb (64 kg)    GEN: Well nourished, well developed in no acute distress NECK: No JVD; No carotid bruits CARDIAC: RRR, no murmurs, rubs, gallops RESPIRATORY:  Clear to auscultation without rales, wheezing or rhonchi  ABDOMEN: Soft, non-tender, non-distended EXTREMITIES:  Mild ankle within the fat pads, no pedal or tibial edema. No acute deformity   Asessement and Plan:.    1. Sinus bradycardia - improved with lower dose of carvedilol, mid 50s. Feeling well. Continue to follow.   2. Chronic HFpEF, HTN - volume status appears stable on exam. Only had to use one dose of Lasix. Has minor ankle fat pad edema that is likely some venous insufficiency. No other signs to suggest hypervolemia. Continue present regimen.  3. CAD, HLD - no recent anginal symptoms. Rosuvastatin discontinued due to LFT elevation. She is relatively spry and seems that she would be a candidate to consider PCSK9i. Will refer to pharmD for discussion. Otherwise continue ASA.  4. H/o TAVR for severe AS with mild MR by echo - echo reviewed as above. There was mention of possible intracavitary gradient but she is asymptomatic and Dr. Tenny [name] felt reassuring. I also reviewed with Dr. Izora [name]. In absence of symptoms, will continue to monitor clinically. Continue ASA. SBE ppx reinforced recent OV.  5. Mild carotid  artery disease- minimal 2014, mild 2020. In setting of age and comorbidities with lack of CVA/TIA symptoms, no indication to acutely repeat at this time.      Disposition: F/u with Dr. Tenny [name] in 6 months.  Signed, Brenda Montana, PA-C

## 2023-02-25 ENCOUNTER — Encounter: Payer: Self-pay | Admitting: Physician Assistant

## 2023-02-25 ENCOUNTER — Ambulatory Visit: Payer: Medicare Other | Attending: Physician Assistant | Admitting: Physician Assistant

## 2023-02-25 VITALS — BP 130/70 | HR 55 | Ht 64.0 in | Wt 140.0 lb

## 2023-02-25 DIAGNOSIS — Z952 Presence of prosthetic heart valve: Secondary | ICD-10-CM | POA: Insufficient documentation

## 2023-02-25 DIAGNOSIS — E785 Hyperlipidemia, unspecified: Secondary | ICD-10-CM | POA: Diagnosis not present

## 2023-02-25 DIAGNOSIS — I10 Essential (primary) hypertension: Secondary | ICD-10-CM | POA: Diagnosis not present

## 2023-02-25 DIAGNOSIS — R001 Bradycardia, unspecified: Secondary | ICD-10-CM

## 2023-02-25 DIAGNOSIS — I340 Nonrheumatic mitral (valve) insufficiency: Secondary | ICD-10-CM | POA: Diagnosis not present

## 2023-02-25 DIAGNOSIS — I2510 Atherosclerotic heart disease of native coronary artery without angina pectoris: Secondary | ICD-10-CM | POA: Insufficient documentation

## 2023-02-25 DIAGNOSIS — I779 Disorder of arteries and arterioles, unspecified: Secondary | ICD-10-CM | POA: Diagnosis not present

## 2023-02-25 DIAGNOSIS — I5032 Chronic diastolic (congestive) heart failure: Secondary | ICD-10-CM | POA: Insufficient documentation

## 2023-02-25 NOTE — Patient Instructions (Signed)
Medication Instructions:  Your physician recommends that you continue on your current medications as directed. Please refer to the Current Medication list given to you today.  *If you need a refill on your cardiac medications before your next appointment, please call your pharmacy*  Follow-Up: At [facility], you and your health needs are our priority.  As part of our continuing mission to provide you with exceptional heart care, we have created designated Provider Care Teams.  These Care Teams include your primary Cardiologist (physician) and Advanced Practice Providers (APPs -  Physician Assistants and Nurse Practitioners) who all work together to provide you with the care you need, when you need it.   Your next appointment:   6 month(s)  Provider:   Dietrich Pates, MD

## 2023-02-28 ENCOUNTER — Ambulatory Visit (INDEPENDENT_AMBULATORY_CARE_PROVIDER_SITE_OTHER): Payer: Medicare Other | Admitting: Gastroenterology

## 2023-02-28 ENCOUNTER — Encounter: Payer: Self-pay | Admitting: Gastroenterology

## 2023-02-28 VITALS — BP 122/70 | HR 60 | Ht 64.0 in

## 2023-02-28 DIAGNOSIS — R109 Unspecified abdominal pain: Secondary | ICD-10-CM

## 2023-02-28 DIAGNOSIS — R140 Abdominal distension (gaseous): Secondary | ICD-10-CM | POA: Diagnosis not present

## 2023-02-28 DIAGNOSIS — K582 Mixed irritable bowel syndrome: Secondary | ICD-10-CM | POA: Diagnosis not present

## 2023-02-28 DIAGNOSIS — K638219 Small intestinal bacterial overgrowth, unspecified: Secondary | ICD-10-CM

## 2023-02-28 DIAGNOSIS — K219 Gastro-esophageal reflux disease without esophagitis: Secondary | ICD-10-CM | POA: Diagnosis not present

## 2023-02-28 DIAGNOSIS — R194 Change in bowel habit: Secondary | ICD-10-CM | POA: Diagnosis not present

## 2023-02-28 DIAGNOSIS — K2270 Barrett's esophagus without dysplasia: Secondary | ICD-10-CM | POA: Diagnosis not present

## 2023-02-28 NOTE — Patient Instructions (Addendum)
[employer]. Follow up as needed.  _______________________________________________________  If your blood pressure at your visit was 140/90 or greater, please contact your primary care physician to follow up on this.  _______________________________________________________  If you are age 87 or older, your body mass index should be between 23-30. Your Body mass index is 24.03 kg/m. If this is out of the aforementioned range listed, please consider follow up with your Primary Care Provider. _________________________________________________________  The [facility] providers would like to encourage you to use [facility] to communicate with providers for non-urgent requests or questions.  Due to long hold times on the telephone, sending your provider a message by [facility] may be a faster and more efficient way to get a response.  Please allow 48 business hours for a response.  Please remember that this is for non-urgent requests.   Due to recent changes in healthcare laws, you may see the results of your imaging and laboratory studies on MyChart before your provider has had a chance to review them.  We understand that in some cases there may be results that are confusing or concerning to you. Not all laboratory results come back in the same time frame and the provider may be waiting for multiple results in order to interpret others.  Please give [place] 48 hours in order for your provider to thoroughly review all the results before contacting the office for clarification of your results.     Thank you for choosing me and [facility].  Vito Cirigliano, D.O.
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
## 2023-03-14 ENCOUNTER — Other Ambulatory Visit: Payer: Self-pay | Admitting: Orthopedic Surgery

## 2023-03-14 NOTE — Telephone Encounter (Signed)
Patient has request refill on medication Xanax. Patient medication last refilled 02/14/2023. Patient has Non Opioid Contract dated 04/07/2022. Patient medication pend and sent to Fletcher Anon, NP due to PCP Octavia Heir, NP being out of office.

## 2023-03-18 ENCOUNTER — Ambulatory Visit: Payer: Medicare Other

## 2023-04-06 ENCOUNTER — Other Ambulatory Visit: Payer: Self-pay | Admitting: Internal Medicine

## 2023-04-13 DIAGNOSIS — H31011 Macula scars of posterior pole (postinflammatory) (post-traumatic), right eye: Secondary | ICD-10-CM | POA: Diagnosis not present

## 2023-05-05 ENCOUNTER — Encounter: Payer: Self-pay | Admitting: Orthopedic Surgery

## 2023-05-05 ENCOUNTER — Ambulatory Visit: Payer: Medicare Other | Admitting: Orthopedic Surgery

## 2023-05-05 VITALS — BP 140/88 | HR 59 | Temp 97.6°F | Resp 16 | Ht 64.0 in | Wt 141.0 lb

## 2023-05-05 DIAGNOSIS — E2839 Other primary ovarian failure: Secondary | ICD-10-CM

## 2023-05-05 DIAGNOSIS — F411 Generalized anxiety disorder: Secondary | ICD-10-CM | POA: Diagnosis not present

## 2023-05-05 DIAGNOSIS — I10 Essential (primary) hypertension: Secondary | ICD-10-CM | POA: Diagnosis not present

## 2023-05-05 DIAGNOSIS — I5032 Chronic diastolic (congestive) heart failure: Secondary | ICD-10-CM

## 2023-05-05 DIAGNOSIS — K5901 Slow transit constipation: Secondary | ICD-10-CM | POA: Diagnosis not present

## 2023-05-05 DIAGNOSIS — D692 Other nonthrombocytopenic purpura: Secondary | ICD-10-CM

## 2023-05-05 MED ORDER — POLYETHYLENE GLYCOL 3350 17 G PO PACK: 17.0000 g | PACK | ORAL | Status: DC

## 2023-05-05 NOTE — Progress Notes (Signed)
Careteam: Patient Care Team: Octavia Heir, NP as PCP - General (Adult Health Nurse Practitioner) Pricilla Riffle, MD as PCP - Cardiology (Cardiology) Zenovia Jordan, MD as Referring Physician (Internal Medicine) [name], Elvin So, MD as Referring Physician (Dermatology) Stephannie Li, MD as Consulting Physician (Ophthalmology) Ermalinda Barrios, MD as Attending Physician (Otolaryngology) Bufford Buttner, MD as Consulting Physician (Nephrology) Lyn Records, PT (Endocrinology)  Seen by: Hazle Nordmann, AGNP-C  PLACE OF SERVICE:  [facility]  Advanced Directive information Does Patient Have a Medical Advance Directive?: Yes, Type of Advance Directive: Healthcare Power of [place];Living will;Out of facility DNR (pink MOST or yellow form), Does patient want to make changes to medical advance directive?: No - Patient declined  Allergies  Allergen Reactions   Losartan Potassium Other (See Comments)   Zoster Vaccine Live Other (See Comments)    Chief Complaint  Patient presents with   Medical Management of Chronic Issues    4 month follow up.    Health Maintenance    Discuss the need for Dexa scan, and AWV.     HPI: Patient is a 89 y.o. female seen today for medical management of chronic conditions.   Daughter present for encounter.   Continues to have bloating. Followed by [facility].   Increased constipation at times. She tried using senna without success. She took a second dose and was able to have BM. Daughter reports inconsistent BM overall  Senile purpura to arms. She is on aspirin.   No recent falls. Ambulates on own. No recent DEXA scan. Remains on Vitamin D.   No recent falls.   No recent panic attacks. Remains on Xanax.   Review of Systems:  Review of Systems  Constitutional: Negative.   HENT: Negative.    Eyes: Negative.   Respiratory:  Negative for cough, shortness of breath and wheezing.   Cardiovascular:  Negative for chest pain and leg swelling.   Gastrointestinal:  Positive for constipation and diarrhea.       Bloating  Genitourinary: Negative.   Musculoskeletal:  Positive for joint pain. Negative for falls.  Skin: Negative.   Neurological: Negative.   Psychiatric/Behavioral:  Negative for depression and memory loss. The patient is nervous/anxious.     Past Medical History:  Diagnosis Date   Allergy    Anxiety    Asthma    Barrett's esophagus    Cancer (HCC)    skin cancer on forehead and nose   DJD (degenerative joint disease)    GERD (gastroesophageal reflux disease)    Goiter    Hemorrhoids    History of colonoscopy    History of shingles    face/eye   History of subdural hematoma    History of traumatic subdural hematoma    Hx of colonic polyps    Hyperlipidemia    Hypertension    IBS (irritable bowel syndrome)    Osteoarthritis    S/P TAVR (transcatheter aortic valve replacement)    23 mm Edwards Sapien 3 transcatheter heart valve placed via percutaneous right transfemoral approach    Sarcoidosis    Sicca (HCC)    Sleep apnea    per pt this is resolved, does not use cpap   Past Surgical History:  Procedure Laterality Date   BREAST LUMPECTOMY  1974   left   CATARACT EXTRACTION W/ INTRAOCULAR LENS  IMPLANT, BILATERAL     CRANIOTOMY Right 01/04/2014   Procedure: CRANIOTOMY HEMATOMA EVACUATION SUBDURAL;  Surgeon: Reinaldo Meeker, MD;  Location: [facility]  NEURO ORS;  Service: Neurosurgery;  Laterality: Right;   CRANIOTOMY N/A 01/08/2014   Procedure: Redo CRANIOTOMY HEMATOMA EVACUATION SUBDURAL RIGHT;  Surgeon: Reinaldo Meeker, MD;  Location: [facility] NEURO ORS;  Service: Neurosurgery;  Laterality: N/A;  Redo CRANIOTOMY HEMATOMA EVACUATION SUBDURAL RIGHT   CRANIOTOMY Right 01/10/2014   Procedure: Redo CRANIOTOMY HEMATOMA EVACUATION SUBDURAL;  Surgeon: Reinaldo Meeker, MD;  Location: [facility] NEURO ORS;  Service: Neurosurgery;  Laterality: Right;   DILATION AND CURETTAGE OF UTERUS     EYE SURGERY     NASAL SINUS SURGERY      NECK SURGERY     x 2   RIGHT/LEFT HEART CATH AND CORONARY ANGIOGRAPHY N/A 09/27/2018   Procedure: RIGHT/LEFT HEART CATH AND CORONARY ANGIOGRAPHY;  Surgeon: Kathleene Hazel, MD;  Location: [facility] INVASIVE CV LAB;  Service: Cardiovascular;  Laterality: N/A;   TEE WITHOUT CARDIOVERSION N/A 11/28/2018   Procedure: TRANSESOPHAGEAL ECHOCARDIOGRAM (TEE);  Surgeon: Tonny Bollman, MD;  Location[facility] OR;  Service: Open Heart Surgery;  Laterality: N/A;   TONSILLECTOMY AND ADENOIDECTOMY  1976   TOTAL THYROIDECTOMY  06/05/2008   TRANSCATHETER AORTIC VALVE REPLACEMENT, TRANSFEMORAL N/A 11/28/2018   Procedure: TRANSCATHETER AORTIC VALVE REPLACEMENT, TRANSFEMORAL;  Surgeon: Tonny Bollman, MD;  Location: [facility] OR;  Service: Open Heart Surgery;  Laterality: N/A;   UPPER GASTROINTESTINAL ENDOSCOPY     Social History:   reports that she has never smoked. She has never used smokeless tobacco. She reports current alcohol use. She reports that she does not use drugs.  Family History  Problem Relation Age of Onset   Hypertension Mother    Hyperlipidemia Mother    Kidney cancer Mother    Lymphoma Mother    Hypertension Father    Hyperlipidemia Father    Rheum arthritis Father    Heart failure Father    Heart disease Father    Hypertension Sister    Allergies Sister    Asthma Sister    Allergies Sister    Thyroid cancer Sister    Allergies Sister    Colon cancer Sister    Allergies Sister    Hypertension Brother    Kidney cancer Maternal Grandmother    Liver disease Neg Hx    Esophageal cancer Neg Hx    Rectal cancer Neg Hx    Stomach cancer Neg Hx     Medications: Patient's Medications  New Prescriptions   No medications on file  Previous Medications   ACETAMINOPHEN (TYLENOL) 650 MG CR TABLET    Take 650 mg by mouth every 8 (eight) hours.   ALPRAZOLAM (XANAX) 0.5 MG TABLET    TAKE 1 TABLET BY MOUTH EVERY MORNING AND 2 TABLETS AT BEDTIME   AMLODIPINE (NORVASC) 5 MG TABLET    Take 1.5  tablets (7.5 mg total) by mouth daily.   AMOXICILLIN (AMOXIL) 500 MG TABLET    TAKE 4 TABSULES ONE HOUR PRIOR TO ALL DENTAL VISITS   ASPIRIN 81 MG CHEWABLE TABLET    Chew 1 tablet (81 mg total) by mouth daily.   CARVEDILOL (COREG) 6.25 MG TABLET    Take 1 tablet (6.25 mg total) by mouth 2 (two) times daily.   CHOLECALCIFEROL (VITAMIN D-3) 25 MCG (1000 UT) CAPS    Take 1,000 Units by mouth daily.   CYCLOSPORINE (RESTASIS) 0.05 % OPHTHALMIC EMULSION    Place 1 drop into both eyes every 12 (twelve) hours.   FUROSEMIDE (LASIX) 20 MG TABLET    Take 1 tablet (20 mg total) by mouth  as needed. Take 1 tablet daily for 3 days then as needed one time daily for swelling   LEVOTHYROXINE (SYNTHROID) 75 MCG TABLET    TAKE 1 TABLET(75 MCG) BY MOUTH DAILY   MULTIPLE VITAMIN PO    Take by mouth.   PANTOPRAZOLE (PROTONIX) 40 MG TABLET    Take 1 tablet (40 mg total) by mouth daily.   ZINC 50 MG CAPS    Take 1 capsule by mouth daily as needed.  Modified Medications   No medications on file  Discontinued Medications   No medications on file    Physical Exam:  Vitals:   05/05/23 1450  BP: (!) 140/88  Pulse: (!) 59  Resp: 16  Temp: 97.6 F (36.4 C)  SpO2: 95%  Weight: 141 lb (64 kg)  Height: 5\' 4"  (1.626 m)   Body mass index is 24.2 kg/m. Wt Readings from Last 3 Encounters:  05/05/23 141 lb (64 kg)  02/25/23 140 lb (63.5 kg)  01/11/23 145 lb (65.8 kg)    Physical Exam Vitals reviewed.  Constitutional:      General: She is not in acute distress. HENT:     Head: Normocephalic.  Eyes:     General:        Right eye: No discharge.        Left eye: No discharge.  Cardiovascular:     Rate and Rhythm: Normal rate and regular rhythm.     Pulses: Normal pulses.     Heart sounds: Murmur heard.  Abdominal:     General: Bowel sounds are normal. There is no distension.     Palpations: Abdomen is soft.     Tenderness: There is no abdominal tenderness.  Musculoskeletal:     Cervical back: Neck  supple.     Right lower leg: No edema.     Left lower leg: No edema.  Skin:    General: Skin is warm.     Capillary Refill: Capillary refill takes less than 2 seconds.     Comments: Non tender purple lesions to bilateral forearms, no skin breakdown.   Neurological:     General: No focal deficit present.     Mental Status: She is alert and oriented to person, place, and time.     Motor: No weakness.     Gait: Gait normal.  Psychiatric:        Mood and Affect: Mood normal.     Labs reviewed: Basic Metabolic Panel: Recent Labs    07/16/22 1307 07/16/22 1318 08/23/22 0754 01/11/23 1532  NA 141  --  139 142  K 3.4*  --  4.0 4.5  CL 103  --  106 102  CO2 21*  --  25 22  GLUCOSE 151*  --  104* 88  BUN 32*  --  23 28*  CREATININE 0.86  --  0.87 1.07*  CALCIUM 7.9*  --  8.2* 8.5*  MG  --  1.8  --   --   TSH  --   --  2.08 0.590   Liver Function Tests: Recent Labs    01/11/23 1532 01/18/23 1126 02/21/23 0959  AST 296* 77* 30  ALT 322* 137* 29  ALKPHOS 78 56 65  BILITOT 0.7 0.9 0.7  PROT 6.8 7.1 6.8  ALBUMIN 4.4 4.3 4.2   Recent Labs    07/16/22 1307  LIPASE 105*   No results for input(s): "AMMONIA" in the last 8760 hours. CBC: Recent Labs    07/16/22  1307 08/23/22 0754 01/11/23 1532  WBC 9.2 5.9 7.3  NEUTROABS  --  3,699  --   HGB 17.1* 12.6 13.4  HCT 50.1* 36.8 40.5  MCV 95.1 92.9 96  PLT 147* 211 180   Lipid Panel: Recent Labs    08/23/22 0754  CHOL 245*  HDL 56  LDLCALC 162*  TRIG 143  CHOLHDL 4.4   TSH: Recent Labs    08/23/22 0754 01/11/23 1532  TSH 2.08 0.590   A1C: Lab Results  Component Value Date   HGBA1C 5.4 11/23/2018     Assessment/Plan 1. Slow transit constipation - ongoing - followed by [facility] - h/o IBS - using senna without success - start miralax OQD  2. Estrogen deficiency - no recent bone density - still ambulates on own - DG BONE DENSITY (DXA); Future  3. Primary hypertension - controlled -  BUN/creat 28/1.07 12/2022 - cont amlodipine and carvedilol  4. Chronic diastolic CHF (congestive heart failure) (HCC) - compensated - cont furosemide  5. Generalized anxiety disorder - no recent panic - cont xanax - need contract update next encounter  6. Senile purpura (HCC) - education given - on aspirin  Total time: 34 minutes. Greater than 50% of total time spent doing patient education regarding health maintenance, constipation, HTN, and anxiety including symptom/medication management.      Next appt: 09/08/2023  Hazle Nordmann, Juel Burrow  [facility] [phone]

## 2023-05-05 NOTE — Patient Instructions (Signed)
Recommend taking Miralax every other day> you may purchase generic

## 2023-05-12 ENCOUNTER — Other Ambulatory Visit: Payer: Self-pay | Admitting: Adult Health

## 2023-05-13 NOTE — Telephone Encounter (Signed)
Patient is requesting a refill of the following medications: Requested Prescriptions   Pending Prescriptions Disp Refills   ALPRAZolam (XANAX) 0.5 MG tablet [Pharmacy Med Name: ALPRAZOLAM 0.5MG  TABLETS] 90 tablet     Sig: TAKE 1 TABLET BY MOUTH EVERY MORNING AND 2 TABLETS AT BEDTIME    Date of last refill: 03/14/2023  Refill amount: 1  Treatment agreement date: 03/30/2022

## 2023-06-10 ENCOUNTER — Other Ambulatory Visit: Payer: Self-pay | Admitting: *Deleted

## 2023-06-10 MED ORDER — ALPRAZOLAM 0.5 MG PO TABS: ORAL_TABLET | ORAL | 0 refills | Status: DC

## 2023-06-10 NOTE — Telephone Encounter (Signed)
Patient requested refill.  Epic LR: 05/13/2023 Contract Date: 03/30/2022. Note added to upcoming appointment to update.   Pended Rx and sent to Hazle Nordmann, NP for approval.

## 2023-06-14 DIAGNOSIS — M5416 Radiculopathy, lumbar region: Secondary | ICD-10-CM | POA: Diagnosis not present

## 2023-07-08 ENCOUNTER — Other Ambulatory Visit: Payer: Self-pay | Admitting: Orthopedic Surgery

## 2023-07-08 NOTE — Telephone Encounter (Signed)
Patient has request refill on medication. Patient medication last refilled 06/10/2023. Medication pend and sent to PCP Octavia Heir, NP for approval.

## 2023-07-24 ENCOUNTER — Other Ambulatory Visit: Payer: Self-pay | Admitting: Orthopedic Surgery

## 2023-07-24 DIAGNOSIS — I10 Essential (primary) hypertension: Secondary | ICD-10-CM

## 2023-07-26 ENCOUNTER — Other Ambulatory Visit: Payer: Self-pay | Admitting: Gastroenterology

## 2023-08-08 ENCOUNTER — Other Ambulatory Visit: Payer: Self-pay | Admitting: Orthopedic Surgery

## 2023-08-08 NOTE — Telephone Encounter (Signed)
 Patient has request refill on medication Xanax. Patient has Non opioid contract on file dated 04/07/2022. Patient medication pend and sent to PCP Octavia Heir, NP for approval.

## 2023-08-25 ENCOUNTER — Ambulatory Visit (INDEPENDENT_AMBULATORY_CARE_PROVIDER_SITE_OTHER): Payer: Medicare Other | Admitting: Orthopedic Surgery

## 2023-08-25 ENCOUNTER — Encounter: Payer: Self-pay | Admitting: Orthopedic Surgery

## 2023-08-25 ENCOUNTER — Ambulatory Visit (HOSPITAL_BASED_OUTPATIENT_CLINIC_OR_DEPARTMENT_OTHER)
Admission: RE | Admit: 2023-08-25 | Discharge: 2023-08-25 | Disposition: A | Discharge status: Home / Self Care | Payer: Medicare Other | Source: Ambulatory Visit | Attending: Orthopedic Surgery | Admitting: Orthopedic Surgery

## 2023-08-25 VITALS — BP 130/86 | HR 84 | Temp 97.4°F | Resp 16 | Ht 64.0 in | Wt 132.0 lb

## 2023-08-25 DIAGNOSIS — R1084 Generalized abdominal pain: Secondary | ICD-10-CM

## 2023-08-25 DIAGNOSIS — R0789 Other chest pain: Secondary | ICD-10-CM

## 2023-08-25 DIAGNOSIS — F5105 Insomnia due to other mental disorder: Secondary | ICD-10-CM

## 2023-08-25 DIAGNOSIS — F419 Anxiety disorder, unspecified: Secondary | ICD-10-CM

## 2023-08-25 DIAGNOSIS — I700 Atherosclerosis of aorta: Secondary | ICD-10-CM | POA: Diagnosis not present

## 2023-08-25 DIAGNOSIS — I878 Other specified disorders of veins: Secondary | ICD-10-CM | POA: Diagnosis not present

## 2023-08-25 MED ORDER — SERTRALINE HCL 25 MG PO TABS
25.0000 mg | ORAL_TABLET | Freq: Every day | ORAL | 3 refills | Status: DC
Start: 2023-08-25 — End: 2023-09-15

## 2023-08-25 NOTE — Patient Instructions (Addendum)
FAX # [phone]  Try Zoloft for at least 3-4 weeks  Schedule lab visit to recheck sodium  You may go to [facility] to have abdominal Xray " KUB" done

## 2023-08-25 NOTE — Progress Notes (Signed)
Careteam: Patient Care Team: Brenda Heir, NP as PCP - General (Adult Health Nurse Practitioner) Pricilla Riffle, MD as PCP - Cardiology (Cardiology) Zenovia Jordan, MD as Referring Physician (Internal Medicine) [name], Elvin So, MD as Referring Physician (Dermatology) Stephannie Li, MD as Consulting Physician (Ophthalmology) Ermalinda Barrios, MD as Attending Physician (Otolaryngology) Bufford Buttner, MD as Consulting Physician (Nephrology) Lyn Records, PT (Endocrinology)  Seen by: Hazle Nordmann, AGNP-C  PLACE OF SERVICE:  [facility]  Advanced Directive information Does Patient Have a Medical Advance Directive?: Yes, Type of Advance Directive: Healthcare Power of [place];Living will, Does patient want to make changes to medical advance directive?: No - Patient declined  Allergies  Allergen Reactions   Losartan Potassium Other (See Comments)   Zoster Vaccine Live Other (See Comments)    Chief Complaint  Patient presents with   Acute Visit    Patient has concerns with gastro/rib pain and causing issues with sleeping.     HPI: Patient is a 89 y.o. female seen today for acute visit due to ongoing abdominal pain and anxiety.   Discussed the use of AI scribe software for clinical note transcription with the patient, who gave verbal consent to proceed.  The patient is a 89 year old female with IBS who presents with abdominal and rib pain, and sleep disturbances. She is accompanied by her daughters.  She experiences intermittent but worsening abdominal and rib pain, associated with her gastrointestinal issues that have persisted since a stomach bug over a year ago. She is concerned about an underlying issue, reports previous scans a year ago showed 'sludge' in her gallbladder. 06/2022 CT abdomen noted mild gallbladder distention, although no cholelithiasis and no inflammation. She is under the care of a gastroenterologist, with a follow-up appointment scheduled for March.  She  has a history of IBS and reports bloating and constipation, with bowel movements every two to four days. She uses Miralax, which has been effective in maintaining motility, but bloating persists. She was advised to use simethicone but not currently taking.   She experiences sleep disturbances, including difficulty falling asleep and staying asleep due to pain. She reports waking up at night feeling like she is not breathing, with a pulse oximetry reading of 93% during these episodes.   She has a history of anxiety and is currently taking alprazolam, 0.5 mg, one in the morning and two at night. Previous trials of Lexapro and Buspar worsened her gastrointestinal symptoms.   She feels nauseated after taking her thyroid medication in the morning, which she has been on since her thyroid was removed years ago. She takes this medication first thing in the morning, around 7 AM, after a long fasting period since her last meal at 5 PM the previous day. Remains on Protonix.       Review of Systems:  Review of Systems  Constitutional:  Negative for malaise/fatigue and weight loss.  HENT:  Negative for congestion.   Eyes:  Negative for blurred vision.  Respiratory:  Negative for shortness of breath.   Cardiovascular:  Positive for chest pain. Negative for leg swelling.        chest wall pain   Gastrointestinal:  Positive for abdominal pain and constipation.       Bloating  Genitourinary:  Negative for dysuria.  Musculoskeletal:  Positive for joint pain. Negative for falls.  Neurological:  Negative for dizziness and headaches.  Psychiatric/Behavioral:  Positive for depression and memory loss. The patient is nervous/anxious and has insomnia.  Past Medical History:  Diagnosis Date   Allergy    Anxiety    Asthma    Barrett's esophagus    Cancer (HCC)    skin cancer on forehead and nose   DJD (degenerative joint disease)    GERD (gastroesophageal reflux disease)    Goiter    Hemorrhoids     History of colonoscopy    History of shingles    face/eye   History of subdural hematoma    History of traumatic subdural hematoma    Hx of colonic polyps    Hyperlipidemia    Hypertension    IBS (irritable bowel syndrome)    Osteoarthritis    S/P TAVR (transcatheter aortic valve replacement)    23 mm Edwards Sapien 3 transcatheter heart valve placed via percutaneous right transfemoral approach    Sarcoidosis    Sicca (HCC)    Sleep apnea    per pt this is resolved, does not use cpap   Past Surgical History:  Procedure Laterality Date   BREAST LUMPECTOMY  1974   left   CATARACT EXTRACTION W/ INTRAOCULAR LENS  IMPLANT, BILATERAL     CRANIOTOMY Right 01/04/2014   Procedure: CRANIOTOMY HEMATOMA EVACUATION SUBDURAL;  Surgeon: Reinaldo Meeker, MD;  Location: [facility];  Service: Neurosurgery;  Laterality: Right;   CRANIOTOMY N/A 01/08/2014   Procedure: Redo CRANIOTOMY HEMATOMA EVACUATION SUBDURAL RIGHT;  Surgeon: Reinaldo Meeker, MD;  Location: [facility];  Service: Neurosurgery;  Laterality: N/A;  Redo CRANIOTOMY HEMATOMA EVACUATION SUBDURAL RIGHT   CRANIOTOMY Right 01/10/2014   Procedure: Redo CRANIOTOMY HEMATOMA EVACUATION SUBDURAL;  Surgeon: Reinaldo Meeker, MD;  Location: [facility];  Service: Neurosurgery;  Laterality: Right;   DILATION AND CURETTAGE OF UTERUS     EYE SURGERY     NASAL SINUS SURGERY     NECK SURGERY     x 2   RIGHT/LEFT HEART CATH AND CORONARY ANGIOGRAPHY N/A 09/27/2018   Procedure: RIGHT/LEFT HEART CATH AND CORONARY ANGIOGRAPHY;  Surgeon: Kathleene Hazel, MD;  Location: [facility];  Service: Cardiovascular;  Laterality: N/A;   TEE WITHOUT CARDIOVERSION N/A 11/28/2018   Procedure: TRANSESOPHAGEAL ECHOCARDIOGRAM (TEE);  Surgeon: Tonny Bollman, MD;  Location: [facility] OR;  Service: Open Heart Surgery;  Laterality: N/A;   TONSILLECTOMY AND ADENOIDECTOMY  1976   TOTAL THYROIDECTOMY  06/05/2008   TRANSCATHETER AORTIC VALVE REPLACEMENT,  TRANSFEMORAL N/A 11/28/2018   Procedure: TRANSCATHETER AORTIC VALVE REPLACEMENT, TRANSFEMORAL;  Surgeon: Tonny Bollman, MD;  Location: [facility] OR;  Service: Open Heart Surgery;  Laterality: N/A;   UPPER GASTROINTESTINAL ENDOSCOPY     Social History:   reports that she has never smoked. She has never used smokeless tobacco. She reports current alcohol use. She reports that she does not use drugs.  Family History  Problem Relation Age of Onset   Hypertension Mother    Hyperlipidemia Mother    Kidney cancer Mother    Lymphoma Mother    Hypertension Father    Hyperlipidemia Father    Rheum arthritis Father    Heart failure Father    Heart disease Father    Hypertension Sister    Allergies Sister    Asthma Sister    Allergies Sister    Thyroid cancer Sister    Allergies Sister    Colon cancer Sister    Allergies Sister    Hypertension Brother    Kidney cancer Maternal Grandmother    Liver disease Neg Hx    Esophageal  cancer Neg Hx    Rectal cancer Neg Hx    Stomach cancer Neg Hx     Medications: Patient's Medications  New Prescriptions   No medications on file  Previous Medications   ACETAMINOPHEN (TYLENOL) 650 MG CR TABLET    Take 650 mg by mouth every 8 (eight) hours.   ALPRAZOLAM (XANAX) 0.5 MG TABLET    TAKE 1 TABLET BY MOUTH EVERY MORNING AND 2 TABLET BY MOUTH EVERY NIGHT AT BEDTIME   AMLODIPINE (NORVASC) 5 MG TABLET    TAKE 1 AND 1/2 TABLETS(7.5 MG) BY MOUTH DAILY   AMOXICILLIN (AMOXIL) 500 MG TABLET    TAKE 4 TABSULES ONE HOUR PRIOR TO ALL DENTAL VISITS   ASPIRIN 81 MG CHEWABLE TABLET    Chew 1 tablet (81 mg total) by mouth daily.   CARVEDILOL (COREG) 6.25 MG TABLET    Take 1 tablet (6.25 mg total) by mouth 2 (two) times daily.   CHOLECALCIFEROL (VITAMIN D-3) 25 MCG (1000 UT) CAPS    Take 1,000 Units by mouth daily.   CYCLOSPORINE (RESTASIS) 0.05 % OPHTHALMIC EMULSION    Place 1 drop into both eyes every 12 (twelve) hours.   FUROSEMIDE (LASIX) 20 MG TABLET    Take 1  tablet (20 mg total) by mouth as needed. Take 1 tablet daily for 3 days then as needed one time daily for swelling   LEVOTHYROXINE (SYNTHROID) 75 MCG TABLET    TAKE 1 TABLET(75 MCG) BY MOUTH DAILY   MULTIPLE VITAMIN PO    Take by mouth.   PANTOPRAZOLE (PROTONIX) 40 MG TABLET    Take 1 tablet (40 mg total) by mouth daily. Patient needs follow up appointment for future refills. Please call [phone] to schedule an appointment.   POLYETHYLENE GLYCOL (MIRALAX / GLYCOLAX) 17 G PACKET    Take 17 g by mouth every other day.   ZINC 50 MG CAPS    Take 1 capsule by mouth daily as needed.  Modified Medications   No medications on file  Discontinued Medications   No medications on file    Physical Exam:  Vitals:   08/25/23 1401  BP: 130/86  Pulse: 84  Resp: 16  Temp: (!) 97.4 F (36.3 C)  SpO2: 97%  Weight: 132 lb (59.9 kg)  Height: 5\' 4"  (1.626 m)   Body mass index is 22.66 kg/m. Wt Readings from Last 3 Encounters:  08/25/23 132 lb (59.9 kg)  05/05/23 141 lb (64 kg)  02/25/23 140 lb (63.5 kg)    Physical Exam Vitals reviewed.  Constitutional:      General: She is not in acute distress. HENT:     Head: Normocephalic.  Eyes:     General:        Right eye: No discharge.        Left eye: No discharge.  Cardiovascular:     Rate and Rhythm: Normal rate and regular rhythm.     Pulses: Normal pulses.     Heart sounds: Normal heart sounds.  Pulmonary:     Effort: Pulmonary effort is normal. No respiratory distress.     Breath sounds: Normal breath sounds. No wheezing or rales.  Chest:     Chest wall: Tenderness present. No mass or lacerations.     Comments: Tenderness to right and left chest wall under axilla Abdominal:     General: Bowel sounds are normal. There is no distension.     Palpations: Abdomen is soft. There is no mass.  Tenderness: There is abdominal tenderness. There is no guarding or rebound.     Hernia: No hernia is present.  Musculoskeletal:     Cervical  back: Neck supple.     Right lower leg: No edema.     Left lower leg: No edema.  Skin:    General: Skin is warm.     Capillary Refill: Capillary refill takes less than 2 seconds.  Neurological:     General: No focal deficit present.     Mental Status: She is alert and oriented to person, place, and time.  Psychiatric:        Mood and Affect: Mood normal.     Labs reviewed: Basic Metabolic Panel: Recent Labs    01/11/23 1532  NA 142  K 4.5  CL 102  CO2 22  GLUCOSE 88  BUN 28*  CREATININE 1.07*  CALCIUM 8.5*  TSH 0.590   Liver Function Tests: Recent Labs    01/11/23 1532 01/18/23 1126 02/21/23 0959  AST 296* 77* 30  ALT 322* 137* 29  ALKPHOS 78 56 65  BILITOT 0.7 0.9 0.7  PROT 6.8 7.1 6.8  ALBUMIN 4.4 4.3 4.2   No results for input(s): "LIPASE", "AMYLASE" in the last 8760 hours. No results for input(s): "AMMONIA" in the last 8760 hours. CBC: Recent Labs    01/11/23 1532  WBC 7.3  HGB 13.4  HCT 40.5  MCV 96  PLT 180   Lipid Panel: No results for input(s): "CHOL", "HDL", "LDLCALC", "TRIG", "CHOLHDL", "LDLDIRECT" in the last 8760 hours. TSH: Recent Labs    01/11/23 1532  TSH 0.590   A1C: Lab Results  Component Value Date   HGBA1C 5.4 11/23/2018     Assessment/Plan 1. Generalized abdominal pain (Primary) - ongoing, periods of profuse bloating/pain - h/o IBS - followed by GI> scheduled 09/2023 - exam unremarkable - 06/2022 CT abdomen noted mild gallbladder distension, no cholelithiasis or inflammation - ? Increased anxiety as cause - clothing is also tight> advised to wear looser pants - KUB to r/o constipation, obstruction - cont Protonix - DG Abd 1 View; Future - CBC with Differential/Platelet - Complete Metabolic Panel with eGFR - Amylase - Lipase  2. Insomnia secondary to anxiety - increased anxiety at night preventing consecutive sleep - denies panic attacks - currently on xanax BID - unsuccessful trial Lexapro and Buspar due to  GI issues - daughter did well with Zoloft - sertraline (ZOLOFT) 25 MG tablet; Take 1 tablet (25 mg total) by mouth daily.  Dispense: 30 tablet; Refill: 3 - bmp in 2 weeks to check Na+  3. Right-sided chest wall pain - noted under right and left axilla - denies recent fall - CXR to r/o rib fracture  - DG Chest 2 View; Future  Total time: 46 minutes. Greater than 50% of total time spent doing patient education regarding generalized abdominal pain, chest wall pain, last CT scan, anxiety and insomnia including symptom/medication management.    Next appt: 09/15/2023  Hazle Nordmann, Juel Burrow  [facility] [phone]

## 2023-08-26 ENCOUNTER — Encounter: Payer: Self-pay | Admitting: Orthopedic Surgery

## 2023-08-26 LAB — CBC WITH DIFFERENTIAL/PLATELET
Absolute Lymphocytes: 1728 {cells}/uL (ref 850–3900)
Absolute Monocytes: 986 {cells}/uL — ABNORMAL HIGH (ref 200–950)
Basophils Absolute: 22 {cells}/uL (ref 0–200)
Basophils Relative: 0.3 %
Eosinophils Absolute: 223 {cells}/uL (ref 15–500)
Eosinophils Relative: 3.1 %
HCT: 40 % (ref 35.0–45.0)
Hemoglobin: 13.8 g/dL (ref 11.7–15.5)
MCH: 30.8 pg (ref 27.0–33.0)
MCHC: 34.5 g/dL (ref 32.0–36.0)
MCV: 89.3 fL (ref 80.0–100.0)
MPV: 9.5 fL (ref 7.5–12.5)
Monocytes Relative: 13.7 %
Neutro Abs: 4241 {cells}/uL (ref 1500–7800)
Neutrophils Relative %: 58.9 %
Platelets: 237 10*3/uL (ref 140–400)
RBC: 4.48 10*6/uL (ref 3.80–5.10)
RDW: 14.8 % (ref 11.0–15.0)
Total Lymphocyte: 24 %
WBC: 7.2 10*3/uL (ref 3.8–10.8)

## 2023-08-26 LAB — COMPLETE METABOLIC PANEL WITH GFR
AG Ratio: 1.6 (calc) (ref 1.0–2.5)
ALT: 25 U/L (ref 6–29)
AST: 19 U/L (ref 10–35)
Albumin: 4.5 g/dL (ref 3.6–5.1)
Alkaline phosphatase (APISO): 66 U/L (ref 37–153)
BUN/Creatinine Ratio: 31 (calc) — ABNORMAL HIGH (ref 6–22)
BUN: 27 mg/dL — ABNORMAL HIGH (ref 7–25)
CO2: 23 mmol/L (ref 20–32)
Calcium: 9 mg/dL (ref 8.6–10.4)
Chloride: 102 mmol/L (ref 98–110)
Creat: 0.86 mg/dL (ref 0.60–0.95)
Globulin: 2.8 g/dL (ref 1.9–3.7)
Glucose, Bld: 94 mg/dL (ref 65–139)
Potassium: 3.8 mmol/L (ref 3.5–5.3)
Sodium: 138 mmol/L (ref 135–146)
Total Bilirubin: 0.7 mg/dL (ref 0.2–1.2)
Total Protein: 7.3 g/dL (ref 6.1–8.1)
eGFR: 64 mL/min/{1.73_m2} (ref >=60)

## 2023-08-26 LAB — AMYLASE: Amylase: 62 U/L (ref 21–101)

## 2023-08-26 LAB — LIPASE: Lipase: 27 U/L (ref 7–60)

## 2023-08-31 ENCOUNTER — Encounter: Payer: Self-pay | Admitting: Orthopedic Surgery

## 2023-09-05 ENCOUNTER — Other Ambulatory Visit: Payer: Self-pay | Admitting: Orthopedic Surgery

## 2023-09-05 NOTE — Telephone Encounter (Signed)
 Patient has request refill on medication. Patient has contract on file dated 04/07/2022. Patient has upcoming appointment 09/15/2023. Update Contract added to appointment notes. Medication pend and sent to Raylene Calamity, NP due to PCP Arnetha Bhat, NP being out of office.

## 2023-09-07 ENCOUNTER — Other Ambulatory Visit: Payer: Self-pay | Admitting: Orthopedic Surgery

## 2023-09-08 ENCOUNTER — Ambulatory Visit: Payer: Medicare Other | Admitting: Orthopedic Surgery

## 2023-09-15 ENCOUNTER — Encounter: Payer: Self-pay | Admitting: Orthopedic Surgery

## 2023-09-15 ENCOUNTER — Ambulatory Visit (INDEPENDENT_AMBULATORY_CARE_PROVIDER_SITE_OTHER): Payer: Medicare Other | Admitting: Orthopedic Surgery

## 2023-09-15 VITALS — BP 108/60 | HR 61 | Temp 97.5°F | Resp 21 | Ht 64.0 in | Wt 130.4 lb

## 2023-09-15 DIAGNOSIS — F5105 Insomnia due to other mental disorder: Secondary | ICD-10-CM | POA: Diagnosis not present

## 2023-09-15 DIAGNOSIS — R1084 Generalized abdominal pain: Secondary | ICD-10-CM

## 2023-09-15 DIAGNOSIS — F411 Generalized anxiety disorder: Secondary | ICD-10-CM

## 2023-09-15 DIAGNOSIS — F419 Anxiety disorder, unspecified: Secondary | ICD-10-CM

## 2023-09-15 MED ORDER — SERTRALINE HCL 50 MG PO TABS: 50.0000 mg | ORAL_TABLET | Freq: Every day | ORAL | 1 refills | Status: DC

## 2023-09-15 NOTE — Progress Notes (Signed)
Careteam: Patient Care Team: Octavia Heir, NP as PCP - General (Adult Health Nurse Practitioner) Pricilla Riffle, MD as PCP - Cardiology (Cardiology) Zenovia Jordan, MD as Referring Physician (Internal Medicine) [name], Elvin So, MD as Referring Physician (Dermatology) Stephannie Li, MD as Consulting Physician (Ophthalmology) Ermalinda Barrios, MD as Attending Physician (Otolaryngology) Bufford Buttner, MD as Consulting Physician (Nephrology) Lyn Records, PT (Endocrinology)  Seen by: Hazle Nordmann, AGNP-C  PLACE OF SERVICE:  [facility]  Advanced Directive information Does Patient Have a Medical Advance Directive?: Yes, Type of Advance Directive: Healthcare Power of [place];Living will;Out of facility DNR (pink MOST or yellow form), Does patient want to make changes to medical advance directive?: No - Patient declined  Allergies  Allergen Reactions   Losartan Potassium Other (See Comments)   Zoster Vaccine Live Other (See Comments)    Chief Complaint  Patient presents with   Medical Management of Chronic Issues    2 week follow-up and sign treatment agreement for Xanax     HPI: Patient is a 89 y.o. female seen today for acute visit due to anxiety.   Discussed the use of AI scribe software for clinical note transcription with the patient, who gave verbal consent to proceed.  History of Present Illness   Brenda [name] is a 89 year old female who presents with anxiety and gastrointestinal symptoms.  Anxiety has improved since starting Zoloft 25 mg 08/25/2023. No recent panic attacks, but she continues to have trouble falling asleep and wakes up two to three times a night. Also on Xanax 0.5 mg twice daily, which was previously reduced from a higher dose.  She has ongoing gastrointestinal issues, including bloating and abdominal pain. There is a history of IBS, which has improved slightly. Bloating has improved with regular use of simethicone. Reports her stomach has reduced  in size and she is fitting in looser clothing. Recent CXR and KUB unremarkable. Lab work to evaluate her pancreas, liver, gallbladder, and bilirubin levels was also normal. Scheduled to see GI 10/10/2023.       Review of Systems:  Review of Systems  Constitutional: Negative.   HENT: Negative.    Respiratory: Negative.    Cardiovascular: Negative.   Gastrointestinal:  Positive for constipation and diarrhea. Negative for abdominal pain.       Bloating  Genitourinary: Negative.   Musculoskeletal: Negative.   Skin: Negative.   Neurological: Negative.   Psychiatric/Behavioral:  Positive for depression. The patient is nervous/anxious and has insomnia.     Past Medical History:  Diagnosis Date   Allergy    Anxiety    Asthma    Barrett's esophagus    Cancer (HCC)    skin cancer on forehead and nose   DJD (degenerative joint disease)    GERD (gastroesophageal reflux disease)    Goiter    Hemorrhoids    History of colonoscopy    History of shingles    face/eye   History of subdural hematoma    History of traumatic subdural hematoma    Hx of colonic polyps    Hyperlipidemia    Hypertension    IBS (irritable bowel syndrome)    Osteoarthritis    S/P TAVR (transcatheter aortic valve replacement)    23 mm Edwards Sapien 3 transcatheter heart valve placed via percutaneous right transfemoral approach    Sarcoidosis    Sicca (HCC)    Sleep apnea    per pt this is resolved, does not use cpap  Past Surgical History:  Procedure Laterality Date   BREAST LUMPECTOMY  1974   left   CATARACT EXTRACTION W/ INTRAOCULAR LENS  IMPLANT, BILATERAL     CRANIOTOMY Right 01/04/2014   Procedure: CRANIOTOMY HEMATOMA EVACUATION SUBDURAL;  Surgeon: Reinaldo Meeker, MD;  Location: [facility];  Service: Neurosurgery;  Laterality: Right;   CRANIOTOMY N/A 01/08/2014   Procedure: Redo CRANIOTOMY HEMATOMA EVACUATION SUBDURAL RIGHT;  Surgeon: Reinaldo Meeker, MD;  Location: [facility];  Service:  Neurosurgery;  Laterality: N/A;  Redo CRANIOTOMY HEMATOMA EVACUATION SUBDURAL RIGHT   CRANIOTOMY Right 01/10/2014   Procedure: Redo CRANIOTOMY HEMATOMA EVACUATION SUBDURAL;  Surgeon: Reinaldo Meeker, MD;  Location: [facility];  Service: Neurosurgery;  Laterality: Right;   DILATION AND CURETTAGE OF UTERUS     EYE SURGERY     NASAL SINUS SURGERY     NECK SURGERY     x 2   RIGHT/LEFT HEART CATH AND CORONARY ANGIOGRAPHY N/A 09/27/2018   Procedure: RIGHT/LEFT HEART CATH AND CORONARY ANGIOGRAPHY;  Surgeon: Kathleene Hazel, MD;  Location: [facility];  Service: Cardiovascular;  Laterality: N/A;   TEE WITHOUT CARDIOVERSION N/A 11/28/2018   Procedure: TRANSESOPHAGEAL ECHOCARDIOGRAM (TEE);  Surgeon: Tonny Bollman, MD;  Location[facility];  Service: Open Heart Surgery;  Laterality: N/A;   TONSILLECTOMY AND ADENOIDECTOMY  1976   TOTAL THYROIDECTOMY  06/05/2008   TRANSCATHETER AORTIC VALVE REPLACEMENT, TRANSFEMORAL N/A 11/28/2018   Procedure: TRANSCATHETER AORTIC VALVE REPLACEMENT, TRANSFEMORAL;  Surgeon: Tonny Bollman, MD;  Location: [facility] OR;  Service: Open Heart Surgery;  Laterality: N/A;   UPPER GASTROINTESTINAL ENDOSCOPY     Social History:   reports that she has never smoked. She has never used smokeless tobacco. She reports current alcohol use. She reports that she does not use drugs.  Family History  Problem Relation Age of Onset   Hypertension Mother    Hyperlipidemia Mother    Kidney cancer Mother    Lymphoma Mother    Hypertension Father    Hyperlipidemia Father    Rheum arthritis Father    Heart failure Father    Heart disease Father    Hypertension Sister    Allergies Sister    Asthma Sister    Allergies Sister    Thyroid cancer Sister    Allergies Sister    Colon cancer Sister    Allergies Sister    Hypertension Brother    Kidney cancer Maternal Grandmother    Liver disease Neg Hx    Esophageal cancer Neg Hx    Rectal cancer Neg Hx    Stomach cancer Neg Hx      Medications: Patient's Medications  New Prescriptions   No medications on file  Previous Medications   ACETAMINOPHEN (TYLENOL) 650 MG CR TABLET    Take 650 mg by mouth every 8 (eight) hours.   ALPRAZOLAM (XANAX) 0.5 MG TABLET    TAKE 1 TABLET BY MOUTH EVERY MORNING AND 2 TABLETS EVERY NIGHT AT BEDTIME   AMLODIPINE (NORVASC) 5 MG TABLET    TAKE 1 AND 1/2 TABLETS(7.5 MG) BY MOUTH DAILY   AMOXICILLIN (AMOXIL) 500 MG TABLET    TAKE 4 TABSULES ONE HOUR PRIOR TO ALL DENTAL VISITS   ASPIRIN 81 MG CHEWABLE TABLET    Chew 1 tablet (81 mg total) by mouth daily.   CARVEDILOL (COREG) 6.25 MG TABLET    Take 1 tablet (6.25 mg total) by mouth 2 (two) times daily.   CHOLECALCIFEROL (VITAMIN D-3) 25 MCG (  1000 UT) CAPS    Take 1,000 Units by mouth daily.   CYCLOSPORINE (RESTASIS) 0.05 % OPHTHALMIC EMULSION    Place 1 drop into both eyes every 12 (twelve) hours.   FUROSEMIDE (LASIX) 20 MG TABLET    Take 1 tablet (20 mg total) by mouth as needed. Take 1 tablet daily for 3 days then as needed one time daily for swelling   LEVOTHYROXINE (SYNTHROID) 75 MCG TABLET    TAKE 1 TABLET(75 MCG) BY MOUTH DAILY   MULTIPLE VITAMIN PO    Take by mouth.   PANTOPRAZOLE (PROTONIX) 40 MG TABLET    Take 1 tablet (40 mg total) by mouth daily. Patient needs follow up appointment for future refills. Please call [phone] to schedule an appointment.   POLYETHYLENE GLYCOL (MIRALAX / GLYCOLAX) 17 G PACKET    Take 17 g by mouth every other day.   SERTRALINE (ZOLOFT) 25 MG TABLET    Take 1 tablet (25 mg total) by mouth daily.   ZINC 50 MG CAPS    Take 1 capsule by mouth daily as needed.  Modified Medications   No medications on file  Discontinued Medications   No medications on file    Physical Exam:  Vitals:   09/15/23 1336  BP: 108/60  Pulse: 61  Resp: (!) 21  Temp: (!) 97.5 F (36.4 C)  SpO2: 98%  Weight: 130 lb 6.4 oz (59.1 kg)  Height: 5\' 4"  (1.626 m)   Body mass index is 22.38 kg/m. Wt Readings from Last 3  Encounters:  09/15/23 130 lb 6.4 oz (59.1 kg)  08/25/23 132 lb (59.9 kg)  05/05/23 141 lb (64 kg)    Physical Exam Vitals reviewed.  Constitutional:      General: She is not in acute distress. HENT:     Head: Normocephalic.  Eyes:     General:        Right eye: No discharge.        Left eye: No discharge.  Cardiovascular:     Rate and Rhythm: Normal rate and regular rhythm.     Pulses: Normal pulses.     Heart sounds: Normal heart sounds.  Pulmonary:     Effort: Pulmonary effort is normal.     Breath sounds: Normal breath sounds.  Abdominal:     General: Bowel sounds are normal. There is no distension.     Palpations: Abdomen is soft.     Tenderness: There is no abdominal tenderness.     Comments: Abdomen appears smaller in size  Musculoskeletal:     Cervical back: Neck supple.     Right lower leg: No edema.     Left lower leg: No edema.  Skin:    General: Skin is warm.     Capillary Refill: Capillary refill takes less than 2 seconds.  Neurological:     General: No focal deficit present.     Mental Status: She is alert and oriented to person, place, and time.     Motor: No weakness.     Gait: Gait normal.  Psychiatric:        Mood and Affect: Mood normal.     Labs reviewed: Basic Metabolic Panel: Recent Labs    01/11/23 1532 08/25/23 1441  NA 142 138  K 4.5 3.8  CL 102 102  CO2 22 23  GLUCOSE 88 94  BUN 28* 27*  CREATININE 1.07* 0.86  CALCIUM 8.5* 9.0  TSH 0.590  --    Liver Function  Tests: Recent Labs    01/11/23 1532 01/18/23 1126 02/21/23 0959 08/25/23 1441  AST 296* 77* 30 19  ALT 322* 137* 29 25  ALKPHOS 78 56 65  --   BILITOT 0.7 0.9 0.7 0.7  PROT 6.8 7.1 6.8 7.3  ALBUMIN 4.4 4.3 4.2  --    Recent Labs    08/25/23 1441  LIPASE 27  AMYLASE 62   No results for input(s): "AMMONIA" in the last 8760 hours. CBC: Recent Labs    01/11/23 1532 08/25/23 1441  WBC 7.3 7.2  NEUTROABS  --  4,241  HGB 13.4 13.8  HCT 40.5 40.0  MCV 96  89.3  PLT 180 237   Lipid Panel: No results for input(s): "CHOL", "HDL", "LDLCALC", "TRIG", "CHOLHDL", "LDLDIRECT" in the last 8760 hours. TSH: Recent Labs    01/11/23 1532  TSH 0.590   A1C: Lab Results  Component Value Date   HGBA1C 5.4 11/23/2018     Assessment/Plan: 1. Insomnia secondary to anxiety - continues having trouble staying asleep - waking up 2-3 times a night - no new panic attacks - 01/30 started Zoloft> improved mood per daughter and patient - recheck sodium level today - recommend increasing Zoloft o 50 mg in 2 weeks if anxiety continue - cont Xanax - sertraline (ZOLOFT) 50 MG tablet; Take 1 tablet (50 mg total) by mouth daily.  Dispense: 90 tablet; Refill: 1 - Basic Metabolic Panel with eGFR  2. Generalized abdominal pain - ongoing - h/o IBS - followed by GI> scheduled 03/17 - recent CXR, KUB unremarkable - suspect pain due to bloating or associated with anxiety - bloating improved with regular use simethicone - abdomen appears smaller on exam, overall unremarkable - recent labs cbc/diff, cmp, lipase, amylase unremarkable - cont simethicone  Total time: 33 minutes. Greater than 50% of total time spent doing patient education regarding increased anxiety, insomnia and abdominal pain including symptom/medication management.     Next appt: Visit date not found  Shuntay Everetts Scherry Ran  [facility] [phone]

## 2023-09-15 NOTE — Patient Instructions (Signed)
Recommend increasing Zoloft to 50 mg in 2 weeks if anxiety has improved but not to fullest. Please let me know if you do this, I will send new prescription.

## 2023-09-16 ENCOUNTER — Encounter: Payer: Self-pay | Admitting: Orthopedic Surgery

## 2023-09-16 LAB — BASIC METABOLIC PANEL WITH GFR
BUN/Creatinine Ratio: 47 (calc) — ABNORMAL HIGH (ref 6–22)
BUN: 28 mg/dL — ABNORMAL HIGH (ref 7–25)
CO2: 28 mmol/L (ref 20–32)
Calcium: 8.8 mg/dL (ref 8.6–10.4)
Chloride: 102 mmol/L (ref 98–110)
Creat: 0.6 mg/dL (ref 0.60–0.95)
Glucose, Bld: 93 mg/dL (ref 65–139)
Potassium: 3.7 mmol/L (ref 3.5–5.3)
Sodium: 140 mmol/L (ref 135–146)
eGFR: 85 mL/min/{1.73_m2} (ref >=60)

## 2023-10-10 ENCOUNTER — Other Ambulatory Visit: Payer: Self-pay | Admitting: Adult Health

## 2023-10-10 ENCOUNTER — Ambulatory Visit: Payer: Medicare Other | Admitting: Gastroenterology

## 2023-10-10 NOTE — Telephone Encounter (Signed)
 Pharmacy requested refill Epic LR: 09/05/2023 Contract Date: 09/15/2023  Pended Rx and sent to Hazle Nordmann, NP for approval..

## 2023-10-30 ENCOUNTER — Other Ambulatory Visit: Payer: Self-pay | Admitting: Gastroenterology

## 2023-10-31 ENCOUNTER — Other Ambulatory Visit: Payer: Self-pay | Admitting: Orthopedic Surgery

## 2023-10-31 ENCOUNTER — Other Ambulatory Visit: Payer: Self-pay | Admitting: Gastroenterology

## 2023-10-31 NOTE — Telephone Encounter (Signed)
 Patient medication last refilled 10/10/2023. Patient medication shouldn't be due at the moment. Patient has Non opioid contract on file dated 09/22/2023. Patient medication pend and sent to PCP Octavia Heir, NP for approval.

## 2023-12-19 ENCOUNTER — Other Ambulatory Visit: Payer: Self-pay | Admitting: Orthopedic Surgery

## 2023-12-19 DIAGNOSIS — F419 Anxiety disorder, unspecified: Secondary | ICD-10-CM

## 2024-01-01 ENCOUNTER — Other Ambulatory Visit: Payer: Self-pay | Admitting: Internal Medicine

## 2024-01-01 DIAGNOSIS — I5032 Chronic diastolic (congestive) heart failure: Secondary | ICD-10-CM

## 2024-01-02 ENCOUNTER — Other Ambulatory Visit: Payer: Self-pay | Admitting: Orthopedic Surgery

## 2024-01-02 NOTE — Telephone Encounter (Signed)
 Pharmacy requested refill.  Epic LR: 10/31/2023 Contract Date: 09/15/2023  Pended Rx and sent to Ulyses Gandy, NP for Approval.

## 2024-01-04 NOTE — Progress Notes (Signed)
 Cardiology Office Note    Date:  01/05/2024  ID:  Netty, Sullivant 01-03-1931, MRN [id number] PCP:  Arnetha Bhat, NP  Cardiologist:  Ola Berger, MD  Electrophysiologist:  None   Chief Complaint: f/u TAVR, CAD, bradycardia  History of Present Illness: .    Brenda [name] is a 89 y.o. female with visit-pertinent history of mild to moderate CAD by pre-TAVR cath 2020 managed medically, severe AS s/p TAVR 11/2018, mild MR by echo, sinus bradycardia, mild carotid disease (1-39% BICA in 2020), chronic HFpEF, HTN, HLD, sarcoidosis, GE reflux disease with Barrett's esophagus, SIBO, chronic cervical spine issues and osteoarthritis seen for follow-up.    When seen in follow-up 2024, she reported an increase in LE edema the previous few months. She'd had an increase in amlodipine  by an outside source several months prior. Her HR was in the 40s. Carvedilol  was reduced and she was trialed on short course of Lasix . LFTs were elevated so statin stopped and subsequently followed for GI for this. She has also been seeing them for SIBO/Rifaxamin therapy. 2D echo 01/2023 showed EF 60-65%, mild LVH, G1DD, intracavitary gradient of with Valsalva, mild MR, TAVR in place with no AI. Dr. Avanell Bob felt this was reassuring. I had also reviewed with Dr. Paulita Boss at last OV who agreed.  She returns for follow-up today doing well. She has only had to use Lasix  sparingly. No CP, palpitations, syncope. She is getting used to living on her own in a townhome - was previously at [facility] but spent much of the time there sick. This week marks 4 years since she lost her husband. She is accompanied by her daughter today. She reports adherence with SBE ppx, had been rx'd by dental team but she is in need of a refill.  Labwork independently reviewed: 08/2023 K 3.7, Cr 0.60 07/2023 LFTs ok, H/H OK, plt OK 07/2022 LDL 162, HDL 56, trig 143   ROS: .    Please see the history of present illness. Daughter notes that  occasionally  All other systems are reviewed and otherwise negative.  Studies Reviewed: Aaron Aas    EKG:  EKG is ordered today, personally reviewed, demonstrating:  EKG Interpretation Date/Time:  Thursday January 05 2024 10:18:42 EDT Ventricular Rate:  68 PR Interval:  142 QRS Duration:  84 QT Interval:  438 QTC Calculation: 465 R Axis:   2  Text Interpretation: Normal sinus rhythm with sinus arrhythmia Nonspecific ST/TW changes III, avF similar to prior No acute changes Confirmed by Kimberli Winne [phone]) on 01/05/2024 10:24:28 AM    CV Studies: Cardiac studies reviewed are outlined and summarized above. Otherwise please see EMR for full report.   Current Reported Medications:.    Current Meds  Medication Sig   acetaminophen  (TYLENOL ) 650 MG CR tablet Take 650 mg by mouth every 8 (eight) hours.   ALPRAZolam  (XANAX ) 0.5 MG tablet TAKE 1 TABLET BY MOUTH EVERY MORNING AND 2 TABLETS EVERY EVENING   amLODipine  (NORVASC ) 5 MG tablet TAKE 1 AND 1/2 TABLETS(7.5 MG) BY MOUTH DAILY   aspirin  EC 81 MG tablet Take 81 mg by mouth daily. Swallow whole.   carvedilol  (COREG ) 6.25 MG tablet TAKE 1 TABLET(6.25 MG) BY MOUTH TWICE DAILY   Cholecalciferol  (VITAMIN D -3) 25 MCG (1000 UT) CAPS Take 1,000 Units by mouth daily.   cycloSPORINE  (RESTASIS ) 0.05 % ophthalmic emulsion Place 1 drop into both eyes every 12 (twelve) hours.   furosemide  (LASIX ) 20 MG tablet Take 1 tablet (  20 mg total) by mouth as needed. Take 1 tablet daily for 3 days then as needed one time daily for swelling   levothyroxine  (SYNTHROID ) 75 MCG tablet TAKE 1 TABLET(75 MCG) BY MOUTH DAILY   MULTIPLE VITAMIN PO Take by mouth.   polyethylene glycol (MIRALAX  / GLYCOLAX ) 17 g packet Take 17 g by mouth every other day.   sertraline  (ZOLOFT ) 50 MG tablet TAKE 1 TABLET(50 MG) BY MOUTH DAILY   Zinc  50 MG CAPS Take 1 capsule by mouth daily as needed.   amoxicillin  (AMOXIL ) 500 MG tablet TAKE 4 TABSULES ONE HOUR PRIOR TO ALL DENTAL VISITS     Physical Exam:    VS:  BP (!) 134/56 (BP Location: Left Arm, Patient Position: Sitting, Cuff Size: Normal)   Pulse 68   Ht 5' 5 (1.651 m)   Wt 130 lb (59 kg)   SpO2 97%   BMI 21.63 kg/m    Wt Readings from Last 3 Encounters:  01/05/24 130 lb (59 kg)  09/15/23 130 lb 6.4 oz (59.1 kg)  08/25/23 132 lb (59.9 kg)    GEN: Well nourished, well developed in no acute distress NECK: No JVD; No carotid bruits CARDIAC: RRR, no murmurs, rubs, gallops RESPIRATORY:  Clear to auscultation without rales, wheezing or rhonchi  ABDOMEN: Soft, non-tender, non-distended EXTREMITIES:  Trace edema without significant enlargement or pitting; No acute deformity   Asessement and Plan:.    1. Sinus bradycardia - remains stable on lower dose of carvedilol , feeling well without any interim concerns for recurrence.  2. Chronic HFpEF, HTN - volume status appears stable. Only has to use Lasix  20mg  sparingly for edema. Update CMET today with labs.   3. CAD, HLD - no recent anginal symptoms. Rosuvastatin  discontinued previously due to LFT elevation. She also had myalgias with this. She also appears to have been remotely on atorvastatin  but unclear what happened with this. At last OV we recommended to see pharmD for lipid management as she appears spry and would otherwise be a candidate to consider statin alternatives. They think they just did not schedule last year but would like to revisit. I checked with our MA that the referral is still active so they should be able to schedule this at their convenience. I updated her med list to reflect she takes a regular 81mg  ASA, not the chewable one. Will get updated CMET, lipid panel, direct LDL today (had very light meal earlier with meds).  4. S/p TAVR for severe AS with mild MR by echo - stable, asymptomatic. Annual echo due after 02/11/24, will order. Continue baby aspirin . Update CBC with labs. Refill amoxicillin  500mg  tablets taking 4 tablets by mouth one hour prior  to dental visits.  5. Mild carotid artery disease - minimal in 2014, mild in 2020 without marked progression between these years. In setting of age and comorbidities with lack of CVA/TIA symptoms or significant bruit, no indication to repeat acutely. Follow clinically. Lipid mgmt as above.    Disposition: F/u with Dr. Avanell Bob or myself in 6 months.  Signed, Samari Gorby N Lyal Husted, PA-C

## 2024-01-05 ENCOUNTER — Ambulatory Visit: Attending: Physician Assistant | Admitting: Physician Assistant

## 2024-01-05 ENCOUNTER — Encounter: Payer: Self-pay | Admitting: Physician Assistant

## 2024-01-05 VITALS — BP 134/56 | HR 68 | Ht 65.0 in | Wt 130.0 lb

## 2024-01-05 DIAGNOSIS — I340 Nonrheumatic mitral (valve) insufficiency: Secondary | ICD-10-CM | POA: Insufficient documentation

## 2024-01-05 DIAGNOSIS — R001 Bradycardia, unspecified: Secondary | ICD-10-CM | POA: Insufficient documentation

## 2024-01-05 DIAGNOSIS — I779 Disorder of arteries and arterioles, unspecified: Secondary | ICD-10-CM | POA: Insufficient documentation

## 2024-01-05 DIAGNOSIS — I10 Essential (primary) hypertension: Secondary | ICD-10-CM | POA: Diagnosis not present

## 2024-01-05 DIAGNOSIS — I2510 Atherosclerotic heart disease of native coronary artery without angina pectoris: Secondary | ICD-10-CM | POA: Insufficient documentation

## 2024-01-05 DIAGNOSIS — I5032 Chronic diastolic (congestive) heart failure: Secondary | ICD-10-CM | POA: Insufficient documentation

## 2024-01-05 DIAGNOSIS — E785 Hyperlipidemia, unspecified: Secondary | ICD-10-CM | POA: Diagnosis not present

## 2024-01-05 DIAGNOSIS — Z952 Presence of prosthetic heart valve: Secondary | ICD-10-CM | POA: Insufficient documentation

## 2024-01-05 MED ORDER — AMOXICILLIN 500 MG PO TABS: ORAL_TABLET | ORAL | 3 refills | Status: AC

## 2024-01-05 NOTE — Patient Instructions (Addendum)
 Medication Instructions:   We have refilled your amoxicillin  to take 1 hour prior to all dental visits.  *If you need a refill on your cardiac medications before your next appointment, please call your pharmacy*  Lab Work:  CMET, lipid panel, direct LDL, CBC today  If you have labs (blood work) drawn today and your tests are completely normal, you will receive your results only by: MyChart Message (if you have MyChart) OR A paper copy in the mail If you have any lab test that is abnormal or we need to change your treatment, we will call you to review the results.  Testing/Procedures:  You will be due your your echocardiogram after 02/11/24.  Your physician has requested that you have an echocardiogram. Echocardiography is a painless test that uses sound waves to create images of your heart. It provides your doctor with information about the size and shape of your heart and how well your heart's chambers and valves are working. This procedure takes approximately one hour. There are no restrictions for this procedure. Please do NOT wear cologne, perfume, aftershave, or lotions (deodorant is allowed). Please arrive 15 minutes prior to your appointment time.  Please note: We ask at that you not bring children with you during ultrasound (echo/ vascular) testing. Due to room size and safety concerns, children are not allowed in the ultrasound rooms during exams. Our front office staff cannot provide observation of children in our lobby area while testing is being conducted. An adult accompanying a patient to their appointment will only be allowed in the ultrasound room at the discretion of the ultrasound technician under special circumstances. We apologize for any inconvenience.   Follow-Up: At [facility], you and your health needs are our priority.  As part of our continuing mission to provide you with exceptional heart care, our providers are all part of one team.  This team includes  your primary Cardiologist (physician) and Advanced Practice Providers or APPs (Physician Assistants and Nurse Practitioners) who all work together to provide you with the care you need, when you need it.  Your next appointment:   6 month(s)  Provider:   Ola Berger, MD or Dayna Dunn PA-C  We recommend signing up for the patient portal called MyChart.  Sign up information is provided on this After Visit Summary.  MyChart is used to connect with patients for Virtual Visits (Telemedicine).  Patients are able to view lab/test results, encounter notes, upcoming appointments, etc.  Non-urgent messages can be sent to your provider as well.   To learn more about what you can do with MyChart, go to [email or web address].

## 2024-01-06 ENCOUNTER — Ambulatory Visit: Payer: Self-pay | Admitting: Physician Assistant

## 2024-01-06 DIAGNOSIS — Q253 Supravalvular aortic stenosis: Secondary | ICD-10-CM

## 2024-01-06 LAB — COMPREHENSIVE METABOLIC PANEL WITH GFR
ALT: 16 IU/L (ref 0–32)
AST: 18 IU/L (ref 0–40)
Albumin: 4.3 g/dL (ref 3.6–4.6)
Alkaline Phosphatase: 102 IU/L (ref 44–121)
BUN/Creatinine Ratio: 32 — ABNORMAL HIGH (ref 12–28)
BUN: 23 mg/dL (ref 10–36)
Bilirubin Total: 0.4 mg/dL (ref 0.0–1.2)
CO2: 21 mmol/L (ref 20–29)
Calcium: 8.7 mg/dL (ref 8.7–10.3)
Chloride: 102 mmol/L (ref 96–106)
Creatinine, Ser: 0.72 mg/dL (ref 0.57–1.00)
Globulin, Total: 2.4 g/dL (ref 1.5–4.5)
Glucose: 94 mg/dL (ref 70–99)
Potassium: 4.3 mmol/L (ref 3.5–5.2)
Sodium: 139 mmol/L (ref 134–144)
Total Protein: 6.7 g/dL (ref 6.0–8.5)
eGFR: 79 mL/min/{1.73_m2} (ref >59)

## 2024-01-06 LAB — LDL CHOLESTEROL, DIRECT: LDL Direct: 138 mg/dL — ABNORMAL HIGH (ref 0–99)

## 2024-01-06 LAB — CBC
Hematocrit: 38.8 % (ref 34.0–46.6)
Hemoglobin: 11.9 g/dL (ref 11.1–15.9)
MCH: 27.8 pg (ref 26.6–33.0)
MCHC: 30.7 g/dL — ABNORMAL LOW (ref 31.5–35.7)
MCV: 91 fL (ref 79–97)
Platelets: 223 10*3/uL (ref 150–450)
RBC: 4.28 x10E6/uL (ref 3.77–5.28)
RDW: 14.6 % (ref 11.7–15.4)
WBC: 6.5 10*3/uL (ref 3.4–10.8)

## 2024-01-12 NOTE — Telephone Encounter (Signed)
 Patient identification verified by 2 forms. Sims Duck, RN    Patient contacted 3 times but unable to reach her. Letter sent in mail asking patient to contact office.

## 2024-01-29 ENCOUNTER — Other Ambulatory Visit: Payer: Self-pay | Admitting: Orthopedic Surgery

## 2024-01-29 DIAGNOSIS — I10 Essential (primary) hypertension: Secondary | ICD-10-CM

## 2024-01-30 ENCOUNTER — Other Ambulatory Visit: Payer: Self-pay | Admitting: Orthopedic Surgery

## 2024-01-30 NOTE — Telephone Encounter (Signed)
 Patient has request for refill on Xanax . Patient last refill was 01/02/2024. Patient has contract on file dated 09/22/2023. Medication pend and sent to PCP Tamera, Amy E, NP) for approval.

## 2024-02-13 DIAGNOSIS — D1801 Hemangioma of skin and subcutaneous tissue: Secondary | ICD-10-CM | POA: Diagnosis not present

## 2024-02-13 DIAGNOSIS — L578 Other skin changes due to chronic exposure to nonionizing radiation: Secondary | ICD-10-CM | POA: Diagnosis not present

## 2024-02-13 DIAGNOSIS — L821 Other seborrheic keratosis: Secondary | ICD-10-CM | POA: Diagnosis not present

## 2024-02-13 DIAGNOSIS — L814 Other melanin hyperpigmentation: Secondary | ICD-10-CM | POA: Diagnosis not present

## 2024-02-13 DIAGNOSIS — D225 Melanocytic nevi of trunk: Secondary | ICD-10-CM | POA: Diagnosis not present

## 2024-02-13 DIAGNOSIS — Z85828 Personal history of other malignant neoplasm of skin: Secondary | ICD-10-CM | POA: Diagnosis not present

## 2024-02-13 DIAGNOSIS — L570 Actinic keratosis: Secondary | ICD-10-CM | POA: Diagnosis not present

## 2024-02-27 ENCOUNTER — Encounter (HOSPITAL_COMMUNITY): Payer: Self-pay

## 2024-02-27 ENCOUNTER — Other Ambulatory Visit: Payer: Self-pay | Admitting: Orthopedic Surgery

## 2024-02-27 ENCOUNTER — Ambulatory Visit (HOSPITAL_COMMUNITY)
Admission: RE | Admit: 2024-02-27 | Discharge: 2024-02-27 | Disposition: A | Discharge status: Home / Self Care | Source: Ambulatory Visit | Attending: Cardiology | Admitting: Cardiology

## 2024-02-27 DIAGNOSIS — Z952 Presence of prosthetic heart valve: Secondary | ICD-10-CM | POA: Diagnosis not present

## 2024-02-27 LAB — ECHOCARDIOGRAM COMPLETE
AR max vel: 1.39 cm2
AV Area VTI: 1.4 cm2
AV Area mean vel: 1.48 cm2
AV Mean grad: 9 mmHg
AV Peak grad: 17 mmHg
Ao pk vel: 2.06 m/s
MV VTI: 1.22 cm2
S' Lateral: 2.36 cm

## 2024-02-27 NOTE — Telephone Encounter (Signed)
 Patient is requesting a refill of the following medications: Requested Prescriptions   Pending Prescriptions Disp Refills   ALPRAZolam  (XANAX ) 0.5 MG tablet [Pharmacy Med Name: ALPRAZOLAM  0.5MG  TABLETS] 90 tablet 5    Sig: TAKE 1 TABLET BY MOUTH EVERY MORNING AND 2 TABLETS EVERY EVENING    Date of last refill: 01/30/24  Refill amount: 90  Treatment agreement date: Feb 2025

## 2024-03-15 ENCOUNTER — Ambulatory Visit: Payer: Medicare Other | Admitting: Orthopedic Surgery

## 2024-03-15 ENCOUNTER — Encounter: Payer: Self-pay | Admitting: Orthopedic Surgery

## 2024-03-15 VITALS — BP 142/64 | HR 60 | Temp 97.6°F | Resp 16 | Ht 65.0 in | Wt 133.2 lb

## 2024-03-15 DIAGNOSIS — I2510 Atherosclerotic heart disease of native coronary artery without angina pectoris: Secondary | ICD-10-CM

## 2024-03-15 DIAGNOSIS — I5032 Chronic diastolic (congestive) heart failure: Secondary | ICD-10-CM

## 2024-03-15 DIAGNOSIS — Z952 Presence of prosthetic heart valve: Secondary | ICD-10-CM

## 2024-03-15 DIAGNOSIS — K921 Melena: Secondary | ICD-10-CM

## 2024-03-15 DIAGNOSIS — F411 Generalized anxiety disorder: Secondary | ICD-10-CM | POA: Diagnosis not present

## 2024-03-15 DIAGNOSIS — I10 Essential (primary) hypertension: Secondary | ICD-10-CM | POA: Diagnosis not present

## 2024-03-15 LAB — CBC WITH DIFFERENTIAL/PLATELET
Absolute Lymphocytes: 1630 {cells}/uL (ref 850–3900)
Absolute Monocytes: 821 {cells}/uL (ref 200–950)
Basophils Absolute: 23 {cells}/uL (ref 0–200)
Basophils Relative: 0.4 %
Eosinophils Absolute: 279 {cells}/uL (ref 15–500)
Eosinophils Relative: 4.9 %
HCT: 37.2 % (ref 35.0–45.0)
Hemoglobin: 11.9 g/dL (ref 11.7–15.5)
MCH: 29.4 pg (ref 27.0–33.0)
MCHC: 32 g/dL (ref 32.0–36.0)
MCV: 91.9 fL (ref 80.0–100.0)
MPV: 9.2 fL (ref 7.5–12.5)
Monocytes Relative: 14.4 %
Neutro Abs: 2947 {cells}/uL (ref 1500–7800)
Neutrophils Relative %: 51.7 %
Platelets: 189 Thousand/uL (ref 140–400)
RBC: 4.05 Million/uL (ref 3.80–5.10)
RDW: 15.5 % — ABNORMAL HIGH (ref 11.0–15.0)
Total Lymphocyte: 28.6 %
WBC: 5.7 Thousand/uL (ref 3.8–10.8)

## 2024-03-15 NOTE — Progress Notes (Unsigned)
 Careteam: Patient Care Team: Gil Greig [name], NP as PCP - General (Adult Health Nurse Practitioner) Okey Vina GAILS, MD as PCP - Cardiology (Cardiology) Ishmael Slough, MD as Referring Physician (Internal Medicine) Tricia, Tawni CROME, MD as Referring Physician (Dermatology) Jarold Mayo, MD as Consulting Physician (Ophthalmology) Thaddeus Locus, MD as Attending Physician (Otolaryngology) Gearline Norris, MD as Consulting Physician (Nephrology) Omar Neptune, PT (Endocrinology)  Seen by: Greig Gil, AGNP-C  PLACE OF SERVICE:  [facility]  Advanced Directive information Does Patient Have a Medical Advance Directive?: Yes, Type of Advance Directive: Healthcare Power of [place];Living will;Out of facility DNR (pink MOST or yellow form), Does patient want to make changes to medical advance directive?: No - Patient declined  Allergies  Allergen Reactions   Losartan Potassium Other (See Comments)   Zoster Vaccine Live Other (See Comments)    Chief Complaint  Patient presents with   Medical Management of Chronic Issues    6 month follow up. Discuss the need for DTAP vaccine, Influenza vaccine, Dexa scan, and AWV.     HPI: Patient is a 89 y.o. female seen today for medical management of chronic conditions.   Discussed the use of AI scribe software for clinical note transcription with the patient, who gave verbal consent to proceed.  History of Present Illness    Daughter present for encounter.   She has been experiencing anxiety, which has improved since starting Zoloft  earlier this year. Her anxiety is less severe compared to when she first moved to live by herself approximately 15 months ago. She continues to take Zoloft  and xanax . She occasionally wakes up at night, attributing this to nocturia rather than anxiety-related insomnia. No recent panic attacks.   She has a history of irritable bowel syndrome (IBS) and experienced diarrhea last night. She does not currently  experience abdominal pain. She was previously on pantoprazole  for heartburn but has been off it for several months as she was not experiencing symptoms. However, she reported diarrhea was black in color last night, which is a new occurrence. Her hemoglobin levels have dropped from 13.8 to 11.9 from 07/2023-12/2023.  She uses simethicone  (Gas-X) daily to manage gas symptoms and avoids carbonated drinks and coffee. She believes wheat bread exacerbates her symptoms.   She has a history of hypertension, which is currently well-controlled. She has not experienced any falls, chest pain, or shortness of breath recently. She has not taken furosemide  in a while as she has not experienced swelling.  She has a history of elevated cholesterol and was previously on rosuvastatin , which was discontinued due to elevated liver enzymes and muscle cramping. Her LDL was noted to be 138 during her last cardiology visit in June. She has not been on statins for over a year.  She takes a magnesium  supplement, which she believes helps with muscle cramps at night. She is cautious about the type of magnesium  due to potential side effects.  She is taking aspirin  for heart health. She has a titanium plate in her neck from previous surgeries, which sometimes causes discomfort and difficulty swallowing pills.       Review of Systems:  Review of Systems  Constitutional: Negative.   HENT: Negative.    Eyes: Negative.   Respiratory: Negative.    Cardiovascular: Negative.   Gastrointestinal:  Positive for abdominal pain, constipation, diarrhea and heartburn.       Gas pain  Genitourinary: Negative.   Musculoskeletal:  Positive for neck pain. Negative for falls.  Skin: Negative.  Neurological: Negative.   Psychiatric/Behavioral:  Negative for depression. The patient is nervous/anxious. The patient does not have insomnia.     Past Medical History:  Diagnosis Date   Allergy    Anxiety    Asthma    Barrett's esophagus     Cancer (HCC)    skin cancer on forehead and nose   DJD (degenerative joint disease)    GERD (gastroesophageal reflux disease)    Goiter    Hemorrhoids    History of colonoscopy    History of shingles    face/eye   History of subdural hematoma    History of traumatic subdural hematoma    Hx of colonic polyps    Hyperlipidemia    Hypertension    IBS (irritable bowel syndrome)    Osteoarthritis    S/P TAVR (transcatheter aortic valve replacement)    23 mm Edwards Sapien 3 transcatheter heart valve placed via percutaneous right transfemoral approach    Sarcoidosis    Sicca (HCC)    Sleep apnea    per pt this is resolved, does not use cpap   Past Surgical History:  Procedure Laterality Date   BREAST LUMPECTOMY  1974   left   CATARACT EXTRACTION W/ INTRAOCULAR LENS  IMPLANT, BILATERAL     CRANIOTOMY Right 01/04/2014   Procedure: CRANIOTOMY HEMATOMA EVACUATION SUBDURAL;  Surgeon: Darina MALVA Boehringer, MD;  Location: [facility];  Service: Neurosurgery;  Laterality: Right;   CRANIOTOMY N/A 01/08/2014   Procedure: Redo CRANIOTOMY HEMATOMA EVACUATION SUBDURAL RIGHT;  Surgeon: Darina MALVA Boehringer, MD;  Location: [facility];  Service: Neurosurgery;  Laterality: N/A;  Redo CRANIOTOMY HEMATOMA EVACUATION SUBDURAL RIGHT   CRANIOTOMY Right 01/10/2014   Procedure: Redo CRANIOTOMY HEMATOMA EVACUATION SUBDURAL;  Surgeon: Darina MALVA Boehringer, MD;  Location: [facility];  Service: Neurosurgery;  Laterality: Right;   DILATION AND CURETTAGE OF UTERUS     EYE SURGERY     NASAL SINUS SURGERY     NECK SURGERY     x 2   RIGHT/LEFT HEART CATH AND CORONARY ANGIOGRAPHY N/A 09/27/2018   Procedure: RIGHT/LEFT HEART CATH AND CORONARY ANGIOGRAPHY;  Surgeon: Verlin Lonni BIRCH, MD;  Location: [facility];  Service: Cardiovascular;  Laterality: N/A;   TEE WITHOUT CARDIOVERSION N/A 11/28/2018   Procedure: TRANSESOPHAGEAL ECHOCARDIOGRAM (TEE);  Surgeon: Wonda Sharper, MD;  Location: [facility];  Service: Open  Heart Surgery;  Laterality: N/A;   TONSILLECTOMY AND ADENOIDECTOMY  1976   TOTAL THYROIDECTOMY  06/05/2008   TRANSCATHETER AORTIC VALVE REPLACEMENT, TRANSFEMORAL N/A 11/28/2018   Procedure: TRANSCATHETER AORTIC VALVE REPLACEMENT, TRANSFEMORAL;  Surgeon: Wonda Sharper, MD;  Location: [facility] OR;  Service: Open Heart Surgery;  Laterality: N/A;   UPPER GASTROINTESTINAL ENDOSCOPY     Social History:   reports that she has never smoked. She has never used smokeless tobacco. She reports current alcohol use. She reports that she does not use drugs.  Family History  Problem Relation Age of Onset   Hypertension Mother    Hyperlipidemia Mother    Kidney cancer Mother    Lymphoma Mother    Hypertension Father    Hyperlipidemia Father    Rheum arthritis Father    Heart failure Father    Heart disease Father    Hypertension Sister    Allergies Sister    Asthma Sister    Allergies Sister    Thyroid  cancer Sister    Allergies Sister    Colon cancer Sister    Allergies Sister  Hypertension Brother    Kidney cancer Maternal Grandmother    Liver disease Neg Hx    Esophageal cancer Neg Hx    Rectal cancer Neg Hx    Stomach cancer Neg Hx     Medications: Patient's Medications  New Prescriptions   No medications on file  Previous Medications   ACETAMINOPHEN  (TYLENOL ) 650 MG CR TABLET    Take 650 mg by mouth every 8 (eight) hours.   ALPRAZOLAM  (XANAX ) 0.5 MG TABLET    TAKE 1 TABLET BY MOUTH EVERY MORNING AND 2 TABLETS EVERY EVENING   AMLODIPINE  (NORVASC ) 5 MG TABLET    TAKE 1 AND 1/2 TABLETS(7.5 MG) BY MOUTH DAILY   AMOXICILLIN  (AMOXIL ) 500 MG TABLET    Take 4 tablets by mouth (2000 mg total) one hour prior to all dental visits   ASPIRIN  EC 81 MG TABLET    Take 81 mg by mouth daily. Swallow whole.   CARVEDILOL  (COREG ) 6.25 MG TABLET    TAKE 1 TABLET(6.25 MG) BY MOUTH TWICE DAILY   CHOLECALCIFEROL  (VITAMIN D -3) 25 MCG (1000 UT) CAPS    Take 1,000 Units by mouth daily.   CYCLOSPORINE   (RESTASIS ) 0.05 % OPHTHALMIC EMULSION    Place 1 drop into both eyes every 12 (twelve) hours.   FUROSEMIDE  (LASIX ) 20 MG TABLET    Take 1 tablet (20 mg total) by mouth as needed. Take 1 tablet daily for 3 days then as needed one time daily for swelling   LEVOTHYROXINE  (SYNTHROID ) 75 MCG TABLET    TAKE 1 TABLET(75 MCG) BY MOUTH DAILY   MULTIPLE VITAMIN PO    Take by mouth.   PANTOPRAZOLE  (PROTONIX ) 40 MG TABLET    Take 1 tablet (40 mg total) by mouth daily. Patient needs follow up appointment for future refills. Please call [phone] to schedule an appointment.   POLYETHYLENE GLYCOL (MIRALAX  / GLYCOLAX ) 17 G PACKET    Take 17 g by mouth every other day.   SERTRALINE  (ZOLOFT ) 50 MG TABLET    TAKE 1 TABLET(50 MG) BY MOUTH DAILY   ZINC  50 MG CAPS    Take 1 capsule by mouth daily as needed.  Modified Medications   No medications on file  Discontinued Medications   No medications on file    Physical Exam:  Vitals:   03/15/24 1407  BP: (!) 142/64  Pulse: 60  Resp: 16  Temp: 97.6 F (36.4 C)  SpO2: 91%  Weight: 133 lb 3.2 oz (60.4 kg)  Height: 5' 5 (1.651 m)   Body mass index is 22.17 kg/m. Wt Readings from Last 3 Encounters:  03/15/24 133 lb 3.2 oz (60.4 kg)  01/05/24 130 lb (59 kg)  09/15/23 130 lb 6.4 oz (59.1 kg)    Physical Exam Vitals reviewed.  Constitutional:      General: She is not in acute distress. HENT:     Head: Normocephalic.  Eyes:     General:        Right eye: No discharge.        Left eye: No discharge.  Cardiovascular:     Rate and Rhythm: Normal rate and regular rhythm.     Pulses: Normal pulses.     Heart sounds: Normal heart sounds.  Pulmonary:     Effort: Pulmonary effort is normal. No respiratory distress.     Breath sounds: Normal breath sounds. No wheezing.  Abdominal:     General: Bowel sounds are normal. There is no distension.  Palpations: Abdomen is soft.     Tenderness: There is no abdominal tenderness.  Musculoskeletal:      Cervical back: Neck supple.     Right lower leg: No edema.     Left lower leg: No edema.  Skin:    General: Skin is warm.     Capillary Refill: Capillary refill takes less than 2 seconds.  Neurological:     General: No focal deficit present.     Mental Status: She is alert and oriented to person, place, and time.  Psychiatric:        Mood and Affect: Mood normal.    Labs reviewed: Basic Metabolic Panel: Recent Labs    08/25/23 1441 09/15/23 1448 01/05/24 1111  NA 138 140 139  K 3.8 3.7 4.3  CL 102 102 102  CO2 23 28 21   GLUCOSE 94 93 94  BUN 27* 28* 23  CREATININE 0.86 0.60 0.72  CALCIUM  9.0 8.8 8.7   Liver Function Tests: Recent Labs    08/25/23 1441 01/05/24 1111  AST 19 18  ALT 25 16  ALKPHOS  --  102  BILITOT 0.7 0.4  PROT 7.3 6.7  ALBUMIN  --  4.3   Recent Labs    08/25/23 1441  LIPASE 27  AMYLASE 62   No results for input(s): AMMONIA in the last 8760 hours. CBC: Recent Labs    08/25/23 1441 01/05/24 1111  WBC 7.2 6.5  NEUTROABS 4,241  --   HGB 13.8 11.9  HCT 40.0 38.8  MCV 89.3 91  PLT 237 223   Lipid Panel: Recent Labs    01/05/24 1111  LDLDIRECT 138*   TSH: No results for input(s): TSH in the last 8760 hours. A1C: Lab Results  Component Value Date   HGBA1C 5.4 11/23/2018     Assessment/Plan: 1. Generalized anxiety disorder (Primary) - improve with Zoloft  - Na+ 139 12/2023 - cont xanax    2. Primary hypertension - controlled - BUN/creat 23/0.72 01/05/2024 - EKG NSR with sinus arrhythmia nonspecific ST changes 01/05/2024 - conr carvedilol   3. Chronic diastolic CHF (congestive heart failure) (HCC) - appears compensated - LVEF 65-70% 02/27/2024 - cont furosemide  prn  5. Black stools - one episode 08/20 - off pantoprazole  for a few months - on asa - hgb dropped from 13 to 11 earlier this year - CBC with Differential/Platelet> Hgb 11.9 unchanged from 12/2023  5. Coronary artery disease involving native coronary  artery of native heart without angina pectoris - followed by cardiology - rosuvastatin  discontinued due to myalgias and elevated LFT's - not interested in Repatha or aggressive cholesterol reduction measures  - cont asa  Total time: 37 minutes. Greater than 50% of total time spent doing patient education regarding anxiety, black stools, HTN, CHF, CAD and health maintenance including symptom/medication management.   Next appt: Visit date not found  Trentyn Boisclair Gil BODILY  [facility] [phone]

## 2024-03-15 NOTE — Patient Instructions (Addendum)
 Take medications with applesauce or pudding if you are having trouble swallowing  Tuck chin when you swallow to help with swallowing   Vitamin C  500 mg- 1000 mg daily during flu season  Try magnesium  glycinate

## 2024-03-16 ENCOUNTER — Ambulatory Visit: Payer: Self-pay | Admitting: Orthopedic Surgery

## 2024-04-15 ENCOUNTER — Other Ambulatory Visit: Payer: Self-pay | Admitting: Internal Medicine

## 2024-04-15 DIAGNOSIS — I5032 Chronic diastolic (congestive) heart failure: Secondary | ICD-10-CM

## 2024-04-17 DIAGNOSIS — H04123 Dry eye syndrome of bilateral lacrimal glands: Secondary | ICD-10-CM | POA: Diagnosis not present

## 2024-05-06 ENCOUNTER — Other Ambulatory Visit: Payer: Self-pay | Admitting: Orthopedic Surgery

## 2024-05-28 ENCOUNTER — Other Ambulatory Visit: Payer: Self-pay | Admitting: Orthopedic Surgery

## 2024-05-28 ENCOUNTER — Ambulatory Visit: Payer: Self-pay

## 2024-05-28 DIAGNOSIS — I10 Essential (primary) hypertension: Secondary | ICD-10-CM

## 2024-05-28 NOTE — Telephone Encounter (Signed)
 FYI Only or Action Required?: FYI only for provider: appointment scheduled on 05/29/24.  Patient was last seen in primary care on 03/15/2024 by Gil Greig [name], NP.  Called Nurse Triage reporting Hematuria.  Symptoms began 2 weeks ago.  Interventions attempted: Nothing.  Symptoms are: few occurrences of blood in urine tinge of blood, urinary frequency stable.  Triage Disposition: See Physician Within 24 Hours  Patient/caregiver understands and will follow disposition?: Yes       Copied from CRM #[id number]. Topic: Clinical - Red Word Triage >> May 28, 2024  2:02 PM Debby BROCKS wrote: Red Word that prompted transfer to Nurse Triage: Madelin Stark (daughter) is calling on behalf of the patient for: blood in her urine, on and off for about two weeks Reason for Disposition  Blood in urine  (Exception: Could be normal menstrual bleeding.)  Answer Assessment - Initial Assessment Questions 1. COLOR of URINE: Describe the color of the urine.  (e.g., tea-colored, pink, red, bloody) Do you have blood clots in your urine? (e.g., none, pea, grape, small coin)     Tinge or small drops of blood, unsure what color. No blood clots.  2. ONSET: When did the bleeding start?      Intermittent x 2 weeks.  3. EPISODES: How many times has there been blood in the urine? or How many times today?     Unsure if she has had any today or yesterday, but has occurred a few times over the past 2 weeks.  4. PAIN with URINATION: Is there any pain with passing your urine? If Yes, ask: How bad is the pain?  (Scale 1-10; or mild, moderate, severe)     No.  5. FEVER: Do you have a fever? If Yes, ask: What is your temperature, how was it measured, and when did it start?     No.  6. ASSOCIATED SYMPTOMS: Are you passing urine more frequently than usual?     Yes.  7. OTHER SYMPTOMS: Do you have any other symptoms? (e.g., back/flank pain, abdomen pain, vomiting)     Urinary frequency. Denies back  or flank pain, nausea, vomiting, fever, abdominal pain.  Protocols used: Urine - Blood In-A-AH

## 2024-05-29 ENCOUNTER — Ambulatory Visit: Admitting: Adult Health

## 2024-06-08 ENCOUNTER — Ambulatory Visit: Payer: Self-pay

## 2024-06-08 NOTE — Telephone Encounter (Addendum)
 Left a detail voicemail message in regards to Brenda Greig [name], NP [id number] Please share the response of the provider down below with patient:   Brenda Greig [name], NP    06/08/24  2:10 PM Note Recommend increasing water intake to flush out possible infection. If symptoms worsen before Monday, please go to urgent care.

## 2024-06-08 NOTE — Telephone Encounter (Signed)
 Patient has made an appointment FYI. See Triage Notes from Triage Nurse

## 2024-06-08 NOTE — Telephone Encounter (Signed)
 Recommend increasing water intake to flush out possible infection. If symptoms worsen before Monday, please go to urgent care.

## 2024-06-08 NOTE — Telephone Encounter (Signed)
 FYI Only or Action Required?: Action required by provider: request for appointment.  Patient was last seen in primary care on 03/15/2024 by Gil Greig [name], NP.  Called Nurse Triage reporting urinary symptoms.  Symptoms began a week ago.  Interventions attempted: Nothing.  Symptoms are: unchanged.Frequency, small amount of blood noted. Asks for appointment Monday.  Triage Disposition: See Physician Within 24 Hours  Patient/caregiver understands and will follow disposition?: Yes     Copied from CRM #[id number]. Topic: Clinical - Red Word Triage >> Jun 08, 2024 12:51 PM Graeme ORN wrote: Red Word that prompted transfer to Nurse Triage: Blood and Frequent urination Answer Assessment - Initial Assessment Questions 1. SYMPTOM: What's the main symptom you're concerned about? (e.g., frequency, incontinence)     Blood, frequency 2. ONSET: When did the    start?     2 weeks 3. PAIN: Is there any pain? If Yes, ask: How bad is it? (Scale: 1-10; mild, moderate, severe)     mild 4. CAUSE: What do you think is causing the symptoms?     UTI 5. OTHER SYMPTOMS: Do you have any other symptoms? (e.g., blood in urine, fever, flank pain, pain with urination)     NO 6. PREGNANCY: Is there any chance you are pregnant? When was your last menstrual period?     NO  Protocols used: Urinary Symptoms-A-AH  Reason for Disposition  Urinating more frequently than usual (i.e., frequency) OR new-onset of the feeling of an urgent need to urinate (i.e., urgency)  Answer Assessment - Initial Assessment Questions 1. SYMPTOM: What's the main symptom you're concerned about? (e.g., frequency, incontinence)     Blood, frequency 2. ONSET: When did the    start?     2 weeks 3. PAIN: Is there any pain? If Yes, ask: How bad is it? (Scale: 1-10; mild, moderate, severe)     mild 4. CAUSE: What do you think is causing the symptoms?     UTI 5. OTHER SYMPTOMS: Do you have any other symptoms?  (e.g., blood in urine, fever, flank pain, pain with urination)     NO 6. PREGNANCY: Is there any chance you are pregnant? When was your last menstrual period?     NO  Protocols used: Urinary Symptoms-A-AH

## 2024-06-11 ENCOUNTER — Ambulatory Visit: Admitting: Family

## 2024-06-11 ENCOUNTER — Encounter: Payer: Self-pay | Admitting: Family

## 2024-06-11 VITALS — BP 120/76 | HR 54 | Temp 97.8°F | Resp 18 | Ht 65.0 in | Wt 137.2 lb

## 2024-06-11 DIAGNOSIS — L821 Other seborrheic keratosis: Secondary | ICD-10-CM | POA: Insufficient documentation

## 2024-06-11 DIAGNOSIS — R300 Dysuria: Secondary | ICD-10-CM

## 2024-06-11 DIAGNOSIS — R682 Dry mouth, unspecified: Secondary | ICD-10-CM

## 2024-06-11 DIAGNOSIS — C44311 Basal cell carcinoma of skin of nose: Secondary | ICD-10-CM | POA: Insufficient documentation

## 2024-06-11 DIAGNOSIS — L814 Other melanin hyperpigmentation: Secondary | ICD-10-CM | POA: Insufficient documentation

## 2024-06-11 DIAGNOSIS — Z85828 Personal history of other malignant neoplasm of skin: Secondary | ICD-10-CM | POA: Insufficient documentation

## 2024-06-11 DIAGNOSIS — R600 Localized edema: Secondary | ICD-10-CM | POA: Diagnosis not present

## 2024-06-11 DIAGNOSIS — L570 Actinic keratosis: Secondary | ICD-10-CM | POA: Insufficient documentation

## 2024-06-11 DIAGNOSIS — L578 Other skin changes due to chronic exposure to nonionizing radiation: Secondary | ICD-10-CM | POA: Insufficient documentation

## 2024-06-11 DIAGNOSIS — D225 Melanocytic nevi of trunk: Secondary | ICD-10-CM | POA: Insufficient documentation

## 2024-06-11 LAB — POCT URINALYSIS DIPSTICK
Bilirubin, UA: NEGATIVE
Glucose, UA: NEGATIVE
Ketones, UA: NEGATIVE
Leukocytes, UA: NEGATIVE
Nitrite, UA: NEGATIVE
Protein, UA: NEGATIVE
Spec Grav, UA: 1.02 (ref 1.010–1.025)
Urobilinogen, UA: NEGATIVE U/dL — AB
pH, UA: 6.5 (ref 5.0–8.0)

## 2024-06-11 NOTE — Patient Instructions (Signed)
 1.Stop at check out & schedule Annual Wellness Visit.
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
## 2024-06-12 LAB — URINE CULTURE
MICRO NUMBER:: 17245997
Result:: NO GROWTH
SPECIMEN QUALITY:: ADEQUATE

## 2024-06-13 ENCOUNTER — Ambulatory Visit: Payer: Self-pay | Admitting: Family

## 2024-07-23 ENCOUNTER — Ambulatory Visit: Payer: Self-pay

## 2024-07-23 NOTE — Telephone Encounter (Signed)
 FYI Only or Action Required?: FYI only for provider: Pt refusing disposition but will call back for appt if worsening.  Patient was last seen in primary care on 06/11/2024 by Ngetich, Roxan BROCKS, NP.  Called Nurse Triage reporting Cough.  Symptoms began several days ago.  Interventions attempted: OTC medications: nasal spray and Rest, hydration, or home remedies.  Symptoms are: rapidly improving per pt.  Triage Disposition: See HCP Within 4 Hours (Or PCP Triage)  Patient/caregiver understands and will follow disposition?: No, refuses disposition     Message from Mackey B sent at 07/23/2024  8:05 AM EST  Summary: [phone] Ms. Tammy Caudle daughter of pt   Reason for Triage: coughing sputum, no fever, sore throat on left side, left ear pain         Reason for Disposition  [1] MILD difficulty breathing (e.g., minimal/no SOB at rest, SOB with walking, pulse < 100) AND [2] still present when not coughing  Answer Assessment - Initial Assessment Questions This RN recommended pt be examined today for symptoms though feeling improved. Pt refusing at this time but plans to call daughter (who originally called for pt) to see if she feels an appt is necessary, otherwise pt plans to call back if she feels worse again. Advised pt call back or go to ED if worsening symptoms like SOB at rest or chest pain not from coughing. Pt verbalized understanding.   First attempt to reach pt daughter to discuss symptoms. Woke up feeling so much better, told her not to call So much difference, couldn't believe Coughing and coughing up all day yesterday Been taking some medicine Nasal spray So much better, called my daughter and told her shouldn't go to doctor yet Did have coughing up for 2 days Was at 3 different homes during [place], think picked up a bug Yesterday was not a good day, coughing up stuff Did allergy tests while ago, allergic to everything outdoors and hadn't been out in a while Was  able to do normal activities this morning Trouble breathing yesterday, was in my chest Prayed about it Not coughed up one thing today No SOB today Little headache on top of head at first but that's gone Last 2 days felt like chest was not right but today not had that feeling And breathing seems to be okay Pain in chest, was coughing 2 days straight, feel chest pain from coughing Yellow sputum Trouble breathing, couldn't relax past 2 days, felt needed to go to doc No fever Daughters are so busy Refusing appt today, will call if feeling worse, will talk to daughter  Protocols used: Cough - Acute Productive-A-AH

## 2024-07-27 ENCOUNTER — Ambulatory Visit: Payer: Self-pay

## 2024-07-27 NOTE — Telephone Encounter (Signed)
 FYI Only or Action Required?: FYI only for provider: pt will go to UC.  Patient was last seen in primary care on 06/11/2024 by Ngetich, Roxan BROCKS, NP.  Called Nurse Triage reporting Cough.  Symptoms began several days ago.  Interventions attempted: Rest, hydration, or home remedies.  Symptoms are: gradually worsening.  Triage Disposition: See Physician Within 24 Hours  Patient/caregiver understands and will follow disposition?: Yes                         Copied from CRM #[id number]. Topic: Clinical - Red Word Triage >> Jul 27, 2024  8:07 AM Alfonso ORN wrote: Red Word that prompted transfer to Nurse Triage: Tightness in chest ,  Patient has a cough and is in her chest and keeping her up at night Reason for Disposition  [1] Continuous (nonstop) coughing interferes with work or school AND [2] no improvement using cough treatment per Care Advice  Answer Assessment - Initial Assessment Questions 1. ONSET: When did the cough begin?      07/23/2024 2. SEVERITY: How bad is the cough today?      Moderate - worse in the evening 3. SPUTUM: Describe the color of your sputum (e.g., none, dry cough; clear, white, yellow, green)     Unsure -  4. HEMOPTYSIS: Are you coughing up any blood? If Yes, ask: How much? (e.g., flecks, streaks, tablespoons, etc.)     no 5. DIFFICULTY BREATHING: Are you having difficulty breathing? If Yes, ask: How bad is it? (e.g., mild, moderate, severe)      Only when coughing 6. FEVER: Do you have a fever? If Yes, ask: What is your temperature, how was it measured, and when did it start?     no 7. CARDIAC HISTORY: Do you have any history of heart disease? (e.g., heart attack, congestive heart failure)      no 8. LUNG HISTORY: Do you have any history of lung disease?  (e.g., pulmonary embolus, asthma, emphysema)     no  10. OTHER SYMPTOMS: Do you have any other symptoms? (e.g., runny nose, wheezing, chest pain)        no  Protocols used: Cough - Acute Non-Productive-A-AH

## 2024-08-23 ENCOUNTER — Other Ambulatory Visit: Payer: Self-pay | Admitting: Orthopedic Surgery

## 2024-08-24 NOTE — Telephone Encounter (Addendum)
 Pharmacy requested refill.  Epic LR: 02/27/2024 Contract Date: 09/15/2023  Pended Rx and sent to Amy for approval.

## 2024-09-13 ENCOUNTER — Ambulatory Visit: Payer: Self-pay | Admitting: Orthopedic Surgery
